# Patient Record
Sex: Female | Born: 1935
Health system: Southern US, Community
[De-identification: ages and names within clinical notes are randomized; demographics above are authoritative.]

## PROBLEM LIST (undated history)

## (undated) DIAGNOSIS — N6019 Diffuse cystic mastopathy of unspecified breast: Secondary | ICD-10-CM

## (undated) DIAGNOSIS — I739 Peripheral vascular disease, unspecified: Secondary | ICD-10-CM

## (undated) DIAGNOSIS — IMO0002 Reserved for concepts with insufficient information to code with codable children: Secondary | ICD-10-CM

## (undated) DIAGNOSIS — I779 Disorder of arteries and arterioles, unspecified: Secondary | ICD-10-CM

## (undated) DIAGNOSIS — G5720 Lesion of femoral nerve, unspecified lower limb: Secondary | ICD-10-CM

## (undated) DIAGNOSIS — E119 Type 2 diabetes mellitus without complications: Secondary | ICD-10-CM

## (undated) DIAGNOSIS — I10 Essential (primary) hypertension: Secondary | ICD-10-CM

## (undated) DIAGNOSIS — E7800 Pure hypercholesterolemia, unspecified: Secondary | ICD-10-CM

## (undated) HISTORY — DX: Type 2 diabetes mellitus without complications: E11.9

## (undated) HISTORY — DX: Pure hypercholesterolemia, unspecified: E78.00

## (undated) HISTORY — DX: Diffuse cystic mastopathy of unspecified breast: N60.19

## (undated) HISTORY — PX: OVARIAN CYST REMOVAL: SHX89

## (undated) HISTORY — PX: CHOLECYSTECTOMY: SHX55

## (undated) HISTORY — DX: Lesion of femoral nerve, unspecified lower limb: G57.20

## (undated) HISTORY — DX: Disorder of arteries and arterioles, unspecified: I77.9

## (undated) HISTORY — DX: Reserved for concepts with insufficient information to code with codable children: IMO0002

## (undated) HISTORY — PX: PARTIAL HYSTERECTOMY: SHX80

## (undated) HISTORY — PX: INGUINAL HERNIA REPAIR: SUR1180

## (undated) HISTORY — PX: HERNIA REPAIR: SHX51

## (undated) HISTORY — PX: APPENDECTOMY: SHX54

## (undated) HISTORY — DX: Essential (primary) hypertension: I10

## (undated) HISTORY — DX: Peripheral vascular disease, unspecified: I73.9

---

## 2004-08-30 LAB — HM COLONOSCOPY

## 2004-12-12 ENCOUNTER — Ambulatory Visit: Payer: Self-pay | Admitting: Internal Medicine

## 2005-01-17 ENCOUNTER — Ambulatory Visit: Payer: Self-pay | Admitting: Internal Medicine

## 2005-01-19 ENCOUNTER — Ambulatory Visit: Payer: Self-pay | Admitting: Internal Medicine

## 2005-02-28 ENCOUNTER — Ambulatory Visit: Payer: Self-pay | Admitting: Unknown Physician Specialty

## 2005-07-25 ENCOUNTER — Ambulatory Visit: Payer: Self-pay | Admitting: Internal Medicine

## 2006-01-21 ENCOUNTER — Ambulatory Visit: Payer: Self-pay | Admitting: Internal Medicine

## 2007-02-12 ENCOUNTER — Ambulatory Visit: Payer: Self-pay | Admitting: Internal Medicine

## 2008-03-03 ENCOUNTER — Ambulatory Visit: Payer: Self-pay | Admitting: Internal Medicine

## 2009-03-09 ENCOUNTER — Ambulatory Visit: Payer: Self-pay | Admitting: Internal Medicine

## 2009-05-17 ENCOUNTER — Ambulatory Visit: Payer: Self-pay | Admitting: Internal Medicine

## 2009-06-14 ENCOUNTER — Observation Stay: Payer: Self-pay | Admitting: Internal Medicine

## 2010-02-27 ENCOUNTER — Ambulatory Visit: Payer: Self-pay | Admitting: Internal Medicine

## 2010-03-20 ENCOUNTER — Ambulatory Visit: Payer: Self-pay | Admitting: Gynecologic Oncology

## 2010-03-22 ENCOUNTER — Ambulatory Visit: Payer: Self-pay | Admitting: Internal Medicine

## 2010-04-03 ENCOUNTER — Ambulatory Visit: Payer: Self-pay | Admitting: Unknown Physician Specialty

## 2010-04-11 ENCOUNTER — Ambulatory Visit: Payer: Self-pay | Admitting: Unknown Physician Specialty

## 2010-04-14 LAB — PATHOLOGY REPORT

## 2010-04-20 ENCOUNTER — Ambulatory Visit: Payer: Self-pay | Admitting: Gynecologic Oncology

## 2010-04-26 ENCOUNTER — Ambulatory Visit: Payer: Self-pay | Admitting: Physician Assistant

## 2010-05-08 ENCOUNTER — Ambulatory Visit: Payer: Self-pay | Admitting: Internal Medicine

## 2010-11-14 ENCOUNTER — Inpatient Hospital Stay: Payer: Self-pay | Admitting: Internal Medicine

## 2011-01-04 ENCOUNTER — Ambulatory Visit: Payer: Self-pay | Admitting: Internal Medicine

## 2011-02-18 ENCOUNTER — Ambulatory Visit: Payer: Self-pay | Admitting: Oncology

## 2011-03-08 ENCOUNTER — Ambulatory Visit: Payer: Self-pay | Admitting: Oncology

## 2011-03-21 ENCOUNTER — Ambulatory Visit: Payer: Self-pay | Admitting: Oncology

## 2011-03-26 ENCOUNTER — Ambulatory Visit: Payer: Self-pay | Admitting: Internal Medicine

## 2011-03-27 ENCOUNTER — Ambulatory Visit: Payer: Self-pay | Admitting: General Surgery

## 2011-03-28 ENCOUNTER — Ambulatory Visit: Payer: Self-pay | Admitting: Internal Medicine

## 2011-03-29 ENCOUNTER — Ambulatory Visit: Payer: Self-pay | Admitting: Oncology

## 2011-04-02 ENCOUNTER — Ambulatory Visit: Payer: Self-pay | Admitting: General Surgery

## 2011-04-16 ENCOUNTER — Ambulatory Visit: Payer: Self-pay | Admitting: General Surgery

## 2011-04-18 LAB — PATHOLOGY REPORT

## 2011-06-04 ENCOUNTER — Ambulatory Visit: Payer: Self-pay | Admitting: Oncology

## 2011-06-05 ENCOUNTER — Ambulatory Visit: Payer: Self-pay | Admitting: Oncology

## 2011-06-21 ENCOUNTER — Ambulatory Visit: Payer: Self-pay | Admitting: Oncology

## 2011-07-04 ENCOUNTER — Inpatient Hospital Stay: Payer: Self-pay | Admitting: Internal Medicine

## 2011-07-10 LAB — PATHOLOGY REPORT

## 2011-08-29 DIAGNOSIS — R05 Cough: Secondary | ICD-10-CM | POA: Diagnosis not present

## 2011-08-29 DIAGNOSIS — R509 Fever, unspecified: Secondary | ICD-10-CM | POA: Diagnosis not present

## 2011-09-07 DIAGNOSIS — R110 Nausea: Secondary | ICD-10-CM | POA: Diagnosis not present

## 2011-10-01 ENCOUNTER — Ambulatory Visit: Payer: Self-pay | Admitting: Internal Medicine

## 2011-10-01 DIAGNOSIS — N6459 Other signs and symptoms in breast: Secondary | ICD-10-CM | POA: Diagnosis not present

## 2011-10-01 DIAGNOSIS — R928 Other abnormal and inconclusive findings on diagnostic imaging of breast: Secondary | ICD-10-CM | POA: Diagnosis not present

## 2011-10-15 DIAGNOSIS — M79609 Pain in unspecified limb: Secondary | ICD-10-CM | POA: Diagnosis not present

## 2011-10-15 DIAGNOSIS — E7800 Pure hypercholesterolemia, unspecified: Secondary | ICD-10-CM | POA: Diagnosis not present

## 2011-10-15 DIAGNOSIS — I10 Essential (primary) hypertension: Secondary | ICD-10-CM | POA: Diagnosis not present

## 2011-10-15 DIAGNOSIS — E1149 Type 2 diabetes mellitus with other diabetic neurological complication: Secondary | ICD-10-CM | POA: Diagnosis not present

## 2011-10-23 DIAGNOSIS — R928 Other abnormal and inconclusive findings on diagnostic imaging of breast: Secondary | ICD-10-CM | POA: Diagnosis not present

## 2011-10-31 DIAGNOSIS — M810 Age-related osteoporosis without current pathological fracture: Secondary | ICD-10-CM | POA: Diagnosis not present

## 2011-11-30 DIAGNOSIS — R110 Nausea: Secondary | ICD-10-CM | POA: Diagnosis not present

## 2011-12-10 DIAGNOSIS — Z79899 Other long term (current) drug therapy: Secondary | ICD-10-CM | POA: Diagnosis not present

## 2011-12-10 DIAGNOSIS — E7800 Pure hypercholesterolemia, unspecified: Secondary | ICD-10-CM | POA: Diagnosis not present

## 2011-12-10 DIAGNOSIS — E119 Type 2 diabetes mellitus without complications: Secondary | ICD-10-CM | POA: Diagnosis not present

## 2011-12-10 DIAGNOSIS — R634 Abnormal weight loss: Secondary | ICD-10-CM | POA: Diagnosis not present

## 2011-12-14 DIAGNOSIS — I10 Essential (primary) hypertension: Secondary | ICD-10-CM | POA: Diagnosis not present

## 2011-12-14 DIAGNOSIS — E119 Type 2 diabetes mellitus without complications: Secondary | ICD-10-CM | POA: Diagnosis not present

## 2011-12-14 DIAGNOSIS — E7800 Pure hypercholesterolemia, unspecified: Secondary | ICD-10-CM | POA: Diagnosis not present

## 2011-12-14 DIAGNOSIS — G609 Hereditary and idiopathic neuropathy, unspecified: Secondary | ICD-10-CM | POA: Diagnosis not present

## 2011-12-17 DIAGNOSIS — G609 Hereditary and idiopathic neuropathy, unspecified: Secondary | ICD-10-CM | POA: Diagnosis not present

## 2011-12-17 DIAGNOSIS — M76899 Other specified enthesopathies of unspecified lower limb, excluding foot: Secondary | ICD-10-CM | POA: Diagnosis not present

## 2011-12-17 DIAGNOSIS — M47817 Spondylosis without myelopathy or radiculopathy, lumbosacral region: Secondary | ICD-10-CM | POA: Diagnosis not present

## 2011-12-17 DIAGNOSIS — M79609 Pain in unspecified limb: Secondary | ICD-10-CM | POA: Diagnosis not present

## 2011-12-24 DIAGNOSIS — M79609 Pain in unspecified limb: Secondary | ICD-10-CM | POA: Diagnosis not present

## 2011-12-24 DIAGNOSIS — R209 Unspecified disturbances of skin sensation: Secondary | ICD-10-CM | POA: Diagnosis not present

## 2011-12-25 DIAGNOSIS — Z1211 Encounter for screening for malignant neoplasm of colon: Secondary | ICD-10-CM | POA: Diagnosis not present

## 2011-12-31 ENCOUNTER — Ambulatory Visit: Payer: Self-pay | Admitting: Rheumatology

## 2011-12-31 DIAGNOSIS — IMO0002 Reserved for concepts with insufficient information to code with codable children: Secondary | ICD-10-CM | POA: Diagnosis not present

## 2011-12-31 DIAGNOSIS — M5137 Other intervertebral disc degeneration, lumbosacral region: Secondary | ICD-10-CM | POA: Diagnosis not present

## 2011-12-31 DIAGNOSIS — M47817 Spondylosis without myelopathy or radiculopathy, lumbosacral region: Secondary | ICD-10-CM | POA: Diagnosis not present

## 2011-12-31 DIAGNOSIS — M51379 Other intervertebral disc degeneration, lumbosacral region without mention of lumbar back pain or lower extremity pain: Secondary | ICD-10-CM | POA: Diagnosis not present

## 2011-12-31 DIAGNOSIS — M5126 Other intervertebral disc displacement, lumbar region: Secondary | ICD-10-CM | POA: Diagnosis not present

## 2011-12-31 DIAGNOSIS — M76899 Other specified enthesopathies of unspecified lower limb, excluding foot: Secondary | ICD-10-CM | POA: Diagnosis not present

## 2012-01-25 DIAGNOSIS — R195 Other fecal abnormalities: Secondary | ICD-10-CM | POA: Diagnosis not present

## 2012-03-19 DIAGNOSIS — Z23 Encounter for immunization: Secondary | ICD-10-CM | POA: Diagnosis not present

## 2012-03-26 ENCOUNTER — Ambulatory Visit: Payer: Self-pay | Admitting: Unknown Physician Specialty

## 2012-03-26 DIAGNOSIS — Z1231 Encounter for screening mammogram for malignant neoplasm of breast: Secondary | ICD-10-CM | POA: Diagnosis not present

## 2012-04-09 DIAGNOSIS — E119 Type 2 diabetes mellitus without complications: Secondary | ICD-10-CM | POA: Diagnosis not present

## 2012-04-09 DIAGNOSIS — Z79899 Other long term (current) drug therapy: Secondary | ICD-10-CM | POA: Diagnosis not present

## 2012-04-09 DIAGNOSIS — E7800 Pure hypercholesterolemia, unspecified: Secondary | ICD-10-CM | POA: Diagnosis not present

## 2012-04-09 DIAGNOSIS — I10 Essential (primary) hypertension: Secondary | ICD-10-CM | POA: Diagnosis not present

## 2012-04-18 DIAGNOSIS — N390 Urinary tract infection, site not specified: Secondary | ICD-10-CM | POA: Diagnosis not present

## 2012-04-18 DIAGNOSIS — R300 Dysuria: Secondary | ICD-10-CM | POA: Diagnosis not present

## 2012-05-30 DIAGNOSIS — R109 Unspecified abdominal pain: Secondary | ICD-10-CM | POA: Diagnosis not present

## 2012-05-30 DIAGNOSIS — I10 Essential (primary) hypertension: Secondary | ICD-10-CM | POA: Diagnosis not present

## 2012-06-12 DIAGNOSIS — Z23 Encounter for immunization: Secondary | ICD-10-CM | POA: Diagnosis not present

## 2012-08-26 ENCOUNTER — Encounter: Payer: Self-pay | Admitting: Internal Medicine

## 2012-08-26 ENCOUNTER — Ambulatory Visit (INDEPENDENT_AMBULATORY_CARE_PROVIDER_SITE_OTHER): Payer: Medicare Other | Admitting: Internal Medicine

## 2012-08-26 VITALS — BP 134/76 | HR 56 | Temp 97.8°F | Ht 66.0 in | Wt 158.0 lb

## 2012-08-26 DIAGNOSIS — E7800 Pure hypercholesterolemia, unspecified: Secondary | ICD-10-CM

## 2012-08-26 DIAGNOSIS — K3184 Gastroparesis: Secondary | ICD-10-CM | POA: Diagnosis not present

## 2012-08-26 DIAGNOSIS — I10 Essential (primary) hypertension: Secondary | ICD-10-CM | POA: Insufficient documentation

## 2012-08-26 DIAGNOSIS — E119 Type 2 diabetes mellitus without complications: Secondary | ICD-10-CM

## 2012-08-26 DIAGNOSIS — E1165 Type 2 diabetes mellitus with hyperglycemia: Secondary | ICD-10-CM | POA: Insufficient documentation

## 2012-08-26 DIAGNOSIS — Z139 Encounter for screening, unspecified: Secondary | ICD-10-CM

## 2012-08-26 DIAGNOSIS — E1143 Type 2 diabetes mellitus with diabetic autonomic (poly)neuropathy: Secondary | ICD-10-CM | POA: Insufficient documentation

## 2012-08-26 NOTE — Assessment & Plan Note (Addendum)
Sugars under good control.  She is no longer taking her lunch insulin.  She has decreased her evening insulin.  Follow.  Avoid lows.

## 2012-09-03 ENCOUNTER — Other Ambulatory Visit (INDEPENDENT_AMBULATORY_CARE_PROVIDER_SITE_OTHER): Payer: Medicare Other

## 2012-09-03 DIAGNOSIS — E7800 Pure hypercholesterolemia, unspecified: Secondary | ICD-10-CM

## 2012-09-03 DIAGNOSIS — E119 Type 2 diabetes mellitus without complications: Secondary | ICD-10-CM | POA: Diagnosis not present

## 2012-09-03 LAB — BASIC METABOLIC PANEL
BUN: 16 mg/dL (ref 6–23)
CO2: 30 mEq/L (ref 19–32)
Chloride: 101 mEq/L (ref 96–112)
Creatinine, Ser: 0.7 mg/dL (ref 0.4–1.2)
Glucose, Bld: 169 mg/dL — ABNORMAL HIGH (ref 70–99)

## 2012-09-03 LAB — HEPATIC FUNCTION PANEL
Alkaline Phosphatase: 50 U/L (ref 39–117)
Bilirubin, Direct: 0.1 mg/dL (ref 0.0–0.3)
Total Bilirubin: 0.7 mg/dL (ref 0.3–1.2)

## 2012-09-03 LAB — LIPID PANEL
Cholesterol: 193 mg/dL (ref 0–200)
LDL Cholesterol: 126 mg/dL — ABNORMAL HIGH (ref 0–99)
Total CHOL/HDL Ratio: 4

## 2012-09-18 ENCOUNTER — Encounter: Payer: Self-pay | Admitting: Internal Medicine

## 2012-09-18 NOTE — Progress Notes (Signed)
Subjective:    Patient ID: Debra Kirk, female    DOB: [birth year], 79 y.o.   MRN: [id number]  HPI 79 year old female with past history of hypertension, hypercholesterolemia, diabetes and mononeuritis involving the left leg who comes in today for a scheduled follow up.  She states she is doing well.  Was having problems with her hip, but is s/p injection and now hip is ok.  Receiving Prolia now.  Due 2/14.  Taking 7 units of insulin with supper only.  Sugars averaging 120-130s in the am, 130-150s at lunch and 110-140 two hours after supper.  No significant problems with low blood sugars.  Overall she feels she is doing well.  No chest pain or tightness.  Breathing stable.  Bowels stable.    Past Medical History  Diagnosis Date  . Hypertension   . Diabetes mellitus   . Hypercholesterolemia   . Femoral neuropathy     left, elevated CRP, ESR, negative FANA and ANCA  . Carotid artery disease   . Compression fracture     s/p fosamax, prolia  . Fibrocystic breast disease     Current Outpatient Prescriptions on File Prior to Visit  Medication Sig Dispense Refill  . amLODipine (NORVASC) 5 MG tablet Take 5 mg by mouth daily.      . Calcium Carbonate-Vitamin D (CALCIUM 600+D) 600-400 MG-UNIT per tablet Take 1 tablet by mouth 2 (two) times daily.      . hydrochlorothiazide (HYDRODIURIL) 25 MG tablet Take 25 mg by mouth daily.      . insulin lispro (HUMALOG) 100 UNIT/ML injection Inject 7 Units into the skin. Before supper      . metoCLOPramide (REGLAN) 5 MG tablet Take 5 mg by mouth 3 (three) times daily.      . metoprolol (LOPRESSOR) 50 MG tablet 1 1/2 tablet bid      . omeprazole (PRILOSEC) 20 MG capsule Take 20 mg by mouth 2 (two) times daily.      . pravastatin (PRAVACHOL) 40 MG tablet Take 40 mg by mouth daily.        Review of Systems Patient denies any headache, lightheadedness or dizziness. No sinus or allergy symptoms.  No chest pain, tightness or palpitations.  No increased shortness  of breath, cough or congestion.  No nausea or vomiting.  No abdominal pain or cramping. No acid reflux.  No bowel change, such as diarrhea, constipation, BRBPR or melana.  No urine change.  Hip better.       Objective:   Physical Exam Filed Vitals:   08/26/12 1030  BP: 134/76  Pulse: 56  Temp: 97.8 F (50.39 C)   79 year old female in no acute distress.   HEENT:  Nares- clear.  Oropharynx - without lesions. NECK:  Supple.  Nontender.  No audible bruit.  HEART:  Appears to be regular. LUNGS:  No crackles or wheezing audible.  Respirations even and unlabored.  RADIAL PULSE:  Equal bilaterally.  ABDOMEN:  Soft, nontender.  Bowel sounds present and normal.  No audible abdominal bruit.  EXTREMITIES:  No increased edema present.  DP pulses palpable and equal bilaterally.           Assessment & Plan:  CARDIOVASCULAR.  ECHO 7/11 revealed EF 60%, mild to moderate mitral insufficiency, mild RV enlargement and mild LA enlargement.  Asymptomatic.  Follow.    PULMONARY.  Breathing stable.    PREVIOUS WEIGHT LOSS.  Weight stable at 158 pounds.    CHEST PAIN/RIB  PAIN.  Resolved.    MEDIASTINAL LYMPH NODES.  Noted on CT scan performed in the evaluation of the chest/rib pain.  Saw Dr Doylene Canning.  Last CT - no significant change.  Felt no further w/up warranted - per pt.    HERNIA.  Saw Dr Lemar Livings.  S/p repair.  Doing well.  Follow.    HEALTH MAINTENANCE.  Physical 12/14/11.  She is s/p hysterectomy and does not need yearly pap smears.  Colonoscopy 02/28/05 - diverticulosis and internal hemorrhoids.  Mammogram 03/26/11 recommended additional views.  These were performed 03/28/11 - read as BiRADS II.  Results forwarded to Dr Doylene Canning.  Advised six month follow up mammogram.  This was performed 2/13 - read as BiRADS II.  Saw Dr Lemar Livings.  Recommended bilateral mammogram fall 2013.  Schedule mammogram.

## 2012-09-18 NOTE — Assessment & Plan Note (Signed)
On pravastatin.  Low cholesterol diet and exercise.  Check lipid panel and liver function.    

## 2012-09-18 NOTE — Assessment & Plan Note (Signed)
Previous EGD revealed findings c/w gastroparesis.  On reglan and doing better.  Continues follow up with GI - regarding duration of reglan treatment.   

## 2012-09-18 NOTE — Assessment & Plan Note (Signed)
Blood pressure is doing well.  Same medication regimen.  Check metabolic panel.  Follow.

## 2012-10-09 ENCOUNTER — Other Ambulatory Visit: Payer: Self-pay | Admitting: *Deleted

## 2012-10-13 ENCOUNTER — Other Ambulatory Visit: Payer: Self-pay | Admitting: *Deleted

## 2012-10-14 MED ORDER — METOPROLOL TARTRATE 50 MG PO TABS: ORAL_TABLET | ORAL | Status: DC

## 2012-10-14 NOTE — Telephone Encounter (Signed)
Sent in to pharmacy.  

## 2012-10-27 DIAGNOSIS — L600 Ingrowing nail: Secondary | ICD-10-CM | POA: Diagnosis not present

## 2012-10-27 DIAGNOSIS — E119 Type 2 diabetes mellitus without complications: Secondary | ICD-10-CM | POA: Diagnosis not present

## 2012-10-31 ENCOUNTER — Telehealth: Payer: Self-pay | Admitting: *Deleted

## 2012-10-31 NOTE — Telephone Encounter (Signed)
Left message for patient to return call, concerning sugar readings. In yellow folder on my desk.

## 2012-11-05 ENCOUNTER — Telehealth: Payer: Self-pay | Admitting: Internal Medicine

## 2012-11-05 NOTE — Telephone Encounter (Signed)
Called patient back, left message for return call. Will try again later.

## 2012-11-05 NOTE — Telephone Encounter (Signed)
Patient returning your call.

## 2012-11-05 NOTE — Telephone Encounter (Signed)
Pt returned call again  °

## 2012-11-06 ENCOUNTER — Other Ambulatory Visit: Payer: Self-pay | Admitting: General Practice

## 2012-11-06 MED ORDER — HYDROCHLOROTHIAZIDE 25 MG PO TABS: 25.0000 mg | ORAL_TABLET | Freq: Every day | ORAL | Status: DC

## 2012-11-06 MED ORDER — OMEPRAZOLE 20 MG PO CPDR: 20.0000 mg | DELAYED_RELEASE_CAPSULE | Freq: Two times a day (BID) | ORAL | Status: DC

## 2012-11-06 NOTE — Telephone Encounter (Signed)
Medications filled for pt.

## 2012-11-06 NOTE — Telephone Encounter (Signed)
Left message on patient's voice mail letting her know every was fine with sugars. Continue to follow.

## 2012-11-19 ENCOUNTER — Telehealth: Payer: Self-pay | Admitting: Internal Medicine

## 2012-11-19 NOTE — Telephone Encounter (Signed)
See if she can come in at 11:45 tomorrow to discuss treatment options.

## 2012-11-19 NOTE — Telephone Encounter (Signed)
Please advise 

## 2012-11-19 NOTE — Telephone Encounter (Signed)
Pt aware of appointment ask pt to be here @ 11:30

## 2012-11-19 NOTE — Telephone Encounter (Signed)
Patient is almost out of her insuline . She stated that she was instructed by Dr. Lorin Picket that when she was almost out of her insuline that she would start her on the pill. Patient does not want the metformin.

## 2012-11-20 ENCOUNTER — Ambulatory Visit (INDEPENDENT_AMBULATORY_CARE_PROVIDER_SITE_OTHER): Payer: Medicare Other | Admitting: Internal Medicine

## 2012-11-20 ENCOUNTER — Encounter: Payer: Self-pay | Admitting: Internal Medicine

## 2012-11-20 VITALS — BP 130/70 | HR 58 | Temp 98.0°F | Ht 66.0 in | Wt 156.5 lb

## 2012-11-20 DIAGNOSIS — I10 Essential (primary) hypertension: Secondary | ICD-10-CM

## 2012-11-20 DIAGNOSIS — E119 Type 2 diabetes mellitus without complications: Secondary | ICD-10-CM | POA: Diagnosis not present

## 2012-11-20 MED ORDER — AMLODIPINE BESYLATE 5 MG PO TABS: 5.0000 mg | ORAL_TABLET | Freq: Every day | ORAL | Status: DC

## 2012-11-20 MED ORDER — GLIMEPIRIDE 2 MG PO TABS: 2.0000 mg | ORAL_TABLET | Freq: Every day | ORAL | Status: DC

## 2012-11-20 MED ORDER — METOPROLOL TARTRATE 50 MG PO TABS: ORAL_TABLET | ORAL | Status: DC

## 2012-11-21 ENCOUNTER — Ambulatory Visit: Payer: Private Health Insurance - Indemnity | Admitting: Internal Medicine

## 2012-11-23 ENCOUNTER — Encounter: Payer: Self-pay | Admitting: Internal Medicine

## 2012-11-23 NOTE — Assessment & Plan Note (Signed)
Blood pressure is doing well.  Same medication regimen.  Follow.   

## 2012-11-23 NOTE — Assessment & Plan Note (Signed)
Sugars as outlined.  Wants to go back on oral agent.  Will stop her insulin.  Start amaryl 2mg  q day.  Monitor for lows.  Eat regular meals.  Check and record sugars bid.  Get her back in soon to reassess.

## 2012-11-23 NOTE — Progress Notes (Signed)
Subjective:    Patient ID: Debra Kirk, female    DOB: 01-11-1930, 79 y.o.   MRN: [id number]  HPI 79 year old female with past history of hypertension, hypercholesterolemia, diabetes and mononeuritis involving the left leg who comes in today for a scheduled follow up.  She states she is doing well.  Was having problems with her hip, but is s/p injection and now hip is ok.  Receiving Prolia now. Taking 8 units of insulin with supper only.  Sugars averaging 140-150 in the am, 100-160  two hours after supper.  No significant problems with low blood sugars.  Overall she feels she is doing well.  No chest pain or tightness.  Breathing stable.  Bowels stable.  Wants to change to oral agents.  Wants to come off the insulin.  Was placed on insulin (from oral agents because she was on prednisone therapy).  Off prednisone now.  Prefers generic.  Prefers not to go on metformin.     Past Medical History  Diagnosis Date  . Hypertension   . Diabetes mellitus   . Hypercholesterolemia   . Femoral neuropathy     left, elevated CRP, ESR, negative FANA and ANCA  . Carotid artery disease   . Compression fracture     s/p fosamax, prolia  . Fibrocystic breast disease     Current Outpatient Prescriptions on File Prior to Visit  Medication Sig Dispense Refill  . aspirin 81 MG tablet Take 81 mg by mouth daily.      . Calcium Carbonate-Vitamin D (CALCIUM 600+D) 600-400 MG-UNIT per tablet Take 1 tablet by mouth 2 (two) times daily.      Marland Kitchen denosumab (PROLIA) 60 MG/ML SOLN injection Inject 60 mg into the skin every 6 (six) months. Administer in upper arm, thigh, or abdomen      . glucose blood test strip Contour test strips Check blood sugar tid      . hydrochlorothiazide (HYDRODIURIL) 25 MG tablet Take 1 tablet (25 mg total) by mouth daily.  90 tablet  1  . metoCLOPramide (REGLAN) 5 MG tablet Take 5 mg by mouth 3 (three) times daily.      Marland Kitchen omeprazole (PRILOSEC) 20 MG capsule Take 1 capsule (20 mg total) by mouth  2 (two) times daily.  180 capsule  1  . pravastatin (PRAVACHOL) 40 MG tablet Take 40 mg by mouth daily.       No current facility-administered medications on file prior to visit.    Review of Systems Patient denies any headache, lightheadedness or dizziness. No sinus or allergy symptoms.  No chest pain, tightness or palpitations.  No increased shortness of breath, cough or congestion.  No nausea or vomiting.  No abdominal pain or cramping. No acid reflux.  No bowel change, such as diarrhea, constipation, BRBPR or melana.  No urine change.  Hip better.       Objective:   Physical Exam  Filed Vitals:   11/20/12 1151  BP: 130/70  Pulse: 58  Temp: 98 F (81.34 C)   79 year old female in no acute distress.   HEENT:  Nares- clear.  Oropharynx - without lesions. NECK:  Supple.  Nontender.  No audible bruit.  HEART:  Appears to be regular. LUNGS:  No crackles or wheezing audible.  Respirations even and unlabored.  RADIAL PULSE:  Equal bilaterally.  ABDOMEN:  Soft, nontender.  Bowel sounds present and normal.  No audible abdominal bruit.  EXTREMITIES:  No increased edema present.  DP  pulses palpable and equal bilaterally.           Assessment & Plan:  CARDIOVASCULAR.  ECHO 7/11 revealed EF 60%, mild to moderate mitral insufficiency, mild RV enlargement and mild LA enlargement.  Asymptomatic.  Follow.    PULMONARY.  Breathing stable.    PREVIOUS WEIGHT LOSS.  Weight relatively stable at 156 pounds. Follow.   CHEST PAIN/RIB PAIN.  Resolved.    MEDIASTINAL LYMPH NODES.  Noted on CT scan performed in the evaluation of the chest/rib pain.  Saw Dr Doylene Canning.  Last CT - no significant change.  Felt no further w/up warranted - per pt.    HERNIA.  Saw Dr Lemar Livings.  S/p repair.  Doing well.  Follow.    HEALTH MAINTENANCE.  Physical 12/14/11.  She is s/p hysterectomy and does not need yearly pap smears.  Colonoscopy 02/28/05 - diverticulosis and internal hemorrhoids.  Mammogram 03/26/11 recommended  additional views.  These were performed 03/28/11 - read as BiRADS II.  Results forwarded to Dr Doylene Canning.  Advised six month follow up mammogram.  This was performed 2/13 - read as BiRADS II.  Saw Dr Lemar Livings.  Recommended bilateral mammogram fall 2013.

## 2012-12-02 ENCOUNTER — Encounter: Payer: Self-pay | Admitting: Internal Medicine

## 2012-12-02 ENCOUNTER — Ambulatory Visit (INDEPENDENT_AMBULATORY_CARE_PROVIDER_SITE_OTHER): Payer: Medicare Other | Admitting: Internal Medicine

## 2012-12-02 VITALS — BP 130/60 | HR 74 | Temp 98.0°F | Ht 65.5 in | Wt 157.5 lb

## 2012-12-02 DIAGNOSIS — Z1239 Encounter for other screening for malignant neoplasm of breast: Secondary | ICD-10-CM | POA: Diagnosis not present

## 2012-12-02 DIAGNOSIS — M810 Age-related osteoporosis without current pathological fracture: Secondary | ICD-10-CM | POA: Diagnosis not present

## 2012-12-02 DIAGNOSIS — E119 Type 2 diabetes mellitus without complications: Secondary | ICD-10-CM

## 2012-12-02 DIAGNOSIS — I10 Essential (primary) hypertension: Secondary | ICD-10-CM | POA: Diagnosis not present

## 2012-12-02 DIAGNOSIS — K3184 Gastroparesis: Secondary | ICD-10-CM

## 2012-12-02 DIAGNOSIS — E7800 Pure hypercholesterolemia, unspecified: Secondary | ICD-10-CM

## 2012-12-02 NOTE — Progress Notes (Signed)
Subjective:    Patient ID: Debra Kirk, female    DOB: Oct 29, 1929, 79 y.o.   MRN: [id number]  HPI 79 year old female with past history of hypertension, hypercholesterolemia, diabetes and mononeuritis involving the left leg who comes in today to follow up on these issues as well as for a complete physical exam.  She states she is doing well.  Was having problems with her hip, but is s/p injection and now hip is ok.  We changed her to oral amaryl last visit.  Off insulin.  No significant problems with low blood sugars.  Overall she feels she is doing well.  No chest pain or tightness.  Breathing stable.  Bowels stable.  Sugars attached.     Past Medical History  Diagnosis Date  . Hypertension   . Diabetes mellitus   . Hypercholesterolemia   . Femoral neuropathy     left, elevated CRP, ESR, negative FANA and ANCA  . Carotid artery disease   . Compression fracture     s/p fosamax, prolia  . Fibrocystic breast disease     Current Outpatient Prescriptions on File Prior to Visit  Medication Sig Dispense Refill  . amLODipine (NORVASC) 5 MG tablet Take 1 tablet (5 mg total) by mouth daily.  90 tablet  1  . aspirin 81 MG tablet Take 81 mg by mouth daily.      . Calcium Carbonate-Vitamin D (CALCIUM 600+D) 600-400 MG-UNIT per tablet Take 1 tablet by mouth 2 (two) times daily.      Marland Kitchen denosumab (PROLIA) 60 MG/ML SOLN injection Inject 60 mg into the skin every 6 (six) months. Administer in upper arm, thigh, or abdomen      . glimepiride (AMARYL) 2 MG tablet Take 1 tablet (2 mg total) by mouth daily before breakfast.  30 tablet  1  . glucose blood test strip Contour test strips Check blood sugar tid      . hydrochlorothiazide (HYDRODIURIL) 25 MG tablet Take 1 tablet (25 mg total) by mouth daily.  90 tablet  1  . metoCLOPramide (REGLAN) 5 MG tablet Take 5 mg by mouth 2 (two) times daily.       . metoprolol (LOPRESSOR) 50 MG tablet 1 1/2 tablet bid  270 tablet  1  . omeprazole (PRILOSEC) 20 MG  capsule Take 1 capsule (20 mg total) by mouth 2 (two) times daily.  180 capsule  1  . pravastatin (PRAVACHOL) 40 MG tablet Take 40 mg by mouth daily.       No current facility-administered medications on file prior to visit.    Review of Systems Patient denies any headache, lightheadedness or dizziness. No sinus or allergy symptoms.  No chest pain, tightness or palpitations.  No increased shortness of breath, cough or congestion.  No nausea or vomiting.   No abdominal pain or cramping. No acid reflux.  No bowel change, such as diarrhea, constipation, BRBPR or melana.  No urine change.  Hip better.       Objective:   Physical Exam  Filed Vitals:   12/02/12 0828  BP: 130/60  Pulse: 74  Temp: 98 F (36.7 C)   Blood pressure recheck:  21/59  79 year old female in no acute distress.   HEENT:  Nares- clear.  Oropharynx - without lesions. NECK:  Supple.  Nontender.  No audible bruit.  HEART:  Appears to be regular. LUNGS:  No crackles or wheezing audible.  Respirations even and unlabored.  RADIAL PULSE:  Equal bilaterally.  BREASTS:  No nipple discharge or nipple retraction present.  Could not appreciate any distinct nodules or axillary adenopathy.  ABDOMEN:  Soft, nontender.  Bowel sounds present and normal.  No audible abdominal bruit.  GU:  Normal external genitalia.  Vaginal vault without lesions.  S/p hysterectomy.  Could not appreciate any adnexal masses or tenderness.   RECTAL:  Heme negative.   EXTREMITIES:  No increased edema present.  DP pulses palpable and equal bilaterally.      SKIN:  Without lesions.      Assessment & Plan:  CARDIOVASCULAR.  ECHO 7/11 revealed EF 60%, mild to moderate mitral insufficiency, mild RV enlargement and mild LA enlargement.  Asymptomatic.  Follow.    PULMONARY.  Breathing stable.    PREVIOUS WEIGHT LOSS.  Weight relatively stable at 157 pounds. Follow.   CHEST PAIN/RIB PAIN.  Resolved.    MEDIASTINAL LYMPH NODES.  Noted on CT scan  performed in the evaluation of the chest/rib pain.  Saw Dr Doylene Canning.  Last CT - no significant change.  Felt no further w/up warranted - per pt.    HERNIA.  Saw Dr Lemar Livings.  S/p repair.  Doing well.  Follow.    HEALTH MAINTENANCE.  Physical today.  She is s/p hysterectomy and does not need yearly pap smears.  Colonoscopy 02/28/05 - diverticulosis and internal hemorrhoids.  Mammogram 03/26/11 recommended additional views.  These were performed 03/28/11 - read as BiRADS II.  Results forwarded to Dr Doylene Canning.  Advised six month follow up mammogram.  This was performed 2/13 - read as BiRADS II.  Saw Dr Lemar Livings.  Recommended bilateral mammogram fall 2013.   Need results.

## 2012-12-07 ENCOUNTER — Encounter: Payer: Self-pay | Admitting: Internal Medicine

## 2012-12-07 NOTE — Assessment & Plan Note (Signed)
Previous EGD revealed findings c/w gastroparesis.  On reglan and doing better.  Continues follow up with GI - regarding duration of reglan treatment.   

## 2012-12-07 NOTE — Assessment & Plan Note (Signed)
Sugars as outlined.  Off insulin.  On amaryl 2mg  q day.  No significant lows.  Discussed eating regular meals.  Appropriate snacks.  Continue to check and record sugars bid.  Get her back in soon to reassess.  Sugars attached.  Hold on making changes.  Follow.

## 2012-12-07 NOTE — Assessment & Plan Note (Signed)
On pravastatin.  Low cholesterol diet and exercise.  Follow lipid panel and liver function.    

## 2012-12-07 NOTE — Assessment & Plan Note (Signed)
Blood pressure is doing well.  Same medication regimen.  Check metabolic panel.  Follow.

## 2012-12-08 ENCOUNTER — Telehealth: Payer: Self-pay | Admitting: Emergency Medicine

## 2012-12-08 MED ORDER — PRAVASTATIN SODIUM 40 MG PO TABS: 40.0000 mg | ORAL_TABLET | Freq: Every day | ORAL | Status: DC

## 2012-12-08 NOTE — Telephone Encounter (Signed)
Can we please refill the pravastatin 40mg  tablet, she states the pharmacy sent over a refill request on Tuesday of last week. I didn't see anything in her chart. She states she does not have any for this evening. Please advise.

## 2012-12-08 NOTE — Telephone Encounter (Signed)
Refilled patient pravastatin 40 mg tab sent to [facility] pharmacy

## 2012-12-30 ENCOUNTER — Encounter: Payer: Self-pay | Admitting: Internal Medicine

## 2012-12-30 ENCOUNTER — Ambulatory Visit (INDEPENDENT_AMBULATORY_CARE_PROVIDER_SITE_OTHER): Payer: Medicare Other | Admitting: Internal Medicine

## 2012-12-30 VITALS — BP 130/70 | HR 74 | Temp 98.5°F | Ht 65.5 in | Wt 157.0 lb

## 2012-12-30 DIAGNOSIS — E7800 Pure hypercholesterolemia, unspecified: Secondary | ICD-10-CM

## 2012-12-30 DIAGNOSIS — K3184 Gastroparesis: Secondary | ICD-10-CM

## 2012-12-30 DIAGNOSIS — I10 Essential (primary) hypertension: Secondary | ICD-10-CM

## 2012-12-30 DIAGNOSIS — E119 Type 2 diabetes mellitus without complications: Secondary | ICD-10-CM

## 2012-12-30 MED ORDER — PRAVASTATIN SODIUM 40 MG PO TABS: 40.0000 mg | ORAL_TABLET | Freq: Every day | ORAL | Status: DC

## 2012-12-30 NOTE — Progress Notes (Signed)
Subjective:    Patient ID: Debra Kirk, female    DOB: [birth year], 79 y.o.   MRN: [id number]  HPI 79 year old female with past history of hypertension, hypercholesterolemia, diabetes and mononeuritis involving the left leg who comes in today for a scheduled follow up. She states she is doing well.  Was having problems with her hip, but is s/p injection and now hip is ok.  We changed her to oral amaryl last visit.  Off insulin.  No significant problems with low blood sugars.  Overall she feels she is doing well.  No chest pain or tightness.  Breathing stable.  Bowels stable.  Sugars attached.     Past Medical History  Diagnosis Date  . Hypertension   . Diabetes mellitus   . Hypercholesterolemia   . Femoral neuropathy     left, elevated CRP, ESR, negative FANA and ANCA  . Carotid artery disease   . Compression fracture     s/p fosamax, prolia  . Fibrocystic breast disease     Current Outpatient Prescriptions on File Prior to Visit  Medication Sig Dispense Refill  . amLODipine (NORVASC) 5 MG tablet Take 1 tablet (5 mg total) by mouth daily.  90 tablet  1  . aspirin 81 MG tablet Take 81 mg by mouth daily.      . Calcium Carbonate-Vitamin D (CALCIUM 600+D) 600-400 MG-UNIT per tablet Take 1 tablet by mouth 2 (two) times daily.      Marland Kitchen denosumab (PROLIA) 60 MG/ML SOLN injection Inject 60 mg into the skin every 6 (six) months. Administer in upper arm, thigh, or abdomen      . glimepiride (AMARYL) 2 MG tablet Take 1 tablet (2 mg total) by mouth daily before breakfast.  30 tablet  1  . glucose blood test strip Contour test strips Check blood sugar tid      . hydrochlorothiazide (HYDRODIURIL) 25 MG tablet Take 1 tablet (25 mg total) by mouth daily.  90 tablet  1  . metoCLOPramide (REGLAN) 5 MG tablet Take 5 mg by mouth 2 (two) times daily.       . metoprolol (LOPRESSOR) 50 MG tablet 1 1/2 tablet bid  270 tablet  1  . omeprazole (PRILOSEC) 20 MG capsule Take 1 capsule (20 mg total) by mouth 2  (two) times daily.  180 capsule  1  . pravastatin (PRAVACHOL) 40 MG tablet Take 1 tablet (40 mg total) by mouth daily.  30 tablet  5   No current facility-administered medications on file prior to visit.    Review of Systems Patient denies any headache, lightheadedness or dizziness. No sinus or allergy symptoms.  No chest pain, tightness or palpitations.  No increased shortness of breath, cough or congestion.  No nausea or vomiting.   No abdominal pain or cramping. No acid reflux.  No bowel change, such as diarrhea, constipation, BRBPR or melana.  No urine change.  Hip better.       Objective:   Physical Exam  Filed Vitals:   12/30/12 0859  BP: 130/70  Pulse: 74  Temp: 98.5 F (36.9 C)   Pulse recheck 58  79 year old female in no acute distress.   HEENT:  Nares- clear.  Oropharynx - without lesions. NECK:  Supple.  Nontender.  No audible bruit.  HEART:  Appears to be regular. LUNGS:  No crackles or wheezing audible.  Respirations even and unlabored.  RADIAL PULSE:  Equal bilaterally.  ABDOMEN:  Soft, nontender.  Bowel sounds  present and normal.  No audible abdominal bruit.   EXTREMITIES:  No increased edema present.  DP pulses palpable and equal bilaterally.      Assessment & Plan:  CARDIOVASCULAR.  ECHO 7/11 revealed EF 60%, mild to moderate mitral insufficiency, mild RV enlargement and mild LA enlargement.  Asymptomatic.  Follow.    PULMONARY.  Breathing stable.    PREVIOUS WEIGHT LOSS.  Weight relatively stable at 157 pounds. Follow.   CHEST PAIN/RIB PAIN.  Resolved.    MEDIASTINAL LYMPH NODES.  Noted on CT scan performed in the evaluation of the chest/rib pain.  Saw Dr Doylene Canning.  Last CT - no significant change.  Felt no further w/up warranted - per pt.    HERNIA.  Saw Dr Lemar Livings.  S/p repair.  Doing well.  Follow.    HEALTH MAINTENANCE.  Physical 12/02/12.  She is s/p hysterectomy and does not need yearly pap smears.  Colonoscopy 02/28/05 - diverticulosis and internal  hemorrhoids.  Mammogram 03/26/11 recommended additional views.  These were performed 03/28/11 - read as BiRADS II.  Results forwarded to Dr Doylene Canning.  Advised six month follow up mammogram.  This was performed 2/13 - read as BiRADS II.  Saw Dr Lemar Livings.  Recommended bilateral mammogram fall 2013.   Per report had mammogram 03/26/12.  Need results.

## 2013-01-01 ENCOUNTER — Telehealth: Payer: Self-pay | Admitting: *Deleted

## 2013-01-01 NOTE — Telephone Encounter (Signed)
Spoke with pt & informed her that Dr Lorin Picket reviewed her BS readings that were mailed: Overall sugars are trending down, pm sugars varying. Continue current med regimen/diet & exercise as tolerated. Continue bedtime snacks & continue to check & record and send in over next 3-4 wks. Pt verbalized understanding.

## 2013-01-12 ENCOUNTER — Encounter: Payer: Self-pay | Admitting: Internal Medicine

## 2013-01-12 NOTE — Assessment & Plan Note (Signed)
Blood pressure is doing well.  Same medication regimen.  Check metabolic panel.  Follow.  

## 2013-01-12 NOTE — Assessment & Plan Note (Signed)
On pravastatin.  Low cholesterol diet and exercise.  Follow lipid panel and liver function.    

## 2013-01-12 NOTE — Assessment & Plan Note (Signed)
Previous EGD revealed findings c/w gastroparesis.  On reglan and doing better.  Continues follow up with GI - regarding duration of reglan treatment.   

## 2013-01-12 NOTE — Assessment & Plan Note (Signed)
Sugars as outlined.  Off insulin.  On amaryl 2mg  q day.  No significant lows.  Discussed eating regular meals.  Appropriate snacks.  Continue to check and record sugars bid.  Sugars attached.  Hold on making changes.  Follow.  (AM sugars averaging 120-140 and PM sugars 170s).

## 2013-01-14 ENCOUNTER — Other Ambulatory Visit: Payer: Medicare Other

## 2013-01-15 ENCOUNTER — Other Ambulatory Visit (INDEPENDENT_AMBULATORY_CARE_PROVIDER_SITE_OTHER): Payer: Medicare Other

## 2013-01-15 DIAGNOSIS — E119 Type 2 diabetes mellitus without complications: Secondary | ICD-10-CM | POA: Diagnosis not present

## 2013-01-15 DIAGNOSIS — E7800 Pure hypercholesterolemia, unspecified: Secondary | ICD-10-CM

## 2013-01-15 LAB — CBC WITH DIFFERENTIAL/PLATELET
Basophils Relative: 0.9 % (ref 0.0–3.0)
Eosinophils Relative: 0 % (ref 0.0–5.0)
HCT: 38.7 % (ref 36.0–46.0)
Hemoglobin: 13.3 g/dL (ref 12.0–15.0)
Lymphs Abs: 1.4 10*3/uL (ref 0.7–4.0)
Monocytes Relative: 9.7 % (ref 3.0–12.0)
Neutro Abs: 3 10*3/uL (ref 1.4–7.7)
RBC: 4.59 Mil/uL (ref 3.87–5.11)
WBC: 4.9 10*3/uL (ref 4.5–10.5)

## 2013-01-15 LAB — BASIC METABOLIC PANEL
CO2: 27 mEq/L (ref 19–32)
Calcium: 8.4 mg/dL (ref 8.4–10.5)
Creatinine, Ser: 0.9 mg/dL (ref 0.4–1.2)
Glucose, Bld: 159 mg/dL — ABNORMAL HIGH (ref 70–99)

## 2013-01-15 LAB — HEPATIC FUNCTION PANEL
AST: 18 U/L (ref 0–37)
Alkaline Phosphatase: 46 U/L (ref 39–117)
Total Bilirubin: 0.7 mg/dL (ref 0.3–1.2)

## 2013-01-15 LAB — TSH: TSH: 1.1 u[IU]/mL (ref 0.35–5.50)

## 2013-01-15 LAB — LIPID PANEL
LDL Cholesterol: 121 mg/dL — ABNORMAL HIGH (ref 0–99)
Total CHOL/HDL Ratio: 5

## 2013-01-15 LAB — MICROALBUMIN / CREATININE URINE RATIO: Microalb Creat Ratio: 0.5 mg/g (ref 0.0–30.0)

## 2013-01-16 ENCOUNTER — Encounter: Payer: Self-pay | Admitting: *Deleted

## 2013-01-26 ENCOUNTER — Encounter: Payer: Self-pay | Admitting: *Deleted

## 2013-03-10 ENCOUNTER — Other Ambulatory Visit: Payer: Self-pay | Admitting: *Deleted

## 2013-03-11 MED ORDER — GLIMEPIRIDE 2 MG PO TABS: 2.0000 mg | ORAL_TABLET | Freq: Every day | ORAL | Status: DC

## 2013-03-17 ENCOUNTER — Telehealth: Payer: Self-pay | Admitting: Internal Medicine

## 2013-03-17 MED ORDER — GLUCOSE BLOOD VI STRP: ORAL_STRIP | Status: DC

## 2013-03-17 NOTE — Telephone Encounter (Signed)
States she has spoken with her pharmacy and they were supposed to have been in touch w/ [place] regarding her test strips.  Pt is out of strips.

## 2013-03-17 NOTE — Telephone Encounter (Signed)
Rx sent in

## 2013-04-01 ENCOUNTER — Ambulatory Visit: Payer: Self-pay | Admitting: Internal Medicine

## 2013-04-01 DIAGNOSIS — Z1231 Encounter for screening mammogram for malignant neoplasm of breast: Secondary | ICD-10-CM | POA: Diagnosis not present

## 2013-04-09 ENCOUNTER — Encounter: Payer: Self-pay | Admitting: Internal Medicine

## 2013-04-27 ENCOUNTER — Other Ambulatory Visit: Payer: Self-pay | Admitting: *Deleted

## 2013-04-27 MED ORDER — HYDROCHLOROTHIAZIDE 25 MG PO TABS: 25.0000 mg | ORAL_TABLET | Freq: Every day | ORAL | Status: DC

## 2013-05-05 ENCOUNTER — Encounter: Payer: Self-pay | Admitting: Internal Medicine

## 2013-05-05 ENCOUNTER — Ambulatory Visit (INDEPENDENT_AMBULATORY_CARE_PROVIDER_SITE_OTHER): Payer: Medicare Other | Admitting: Internal Medicine

## 2013-05-05 VITALS — BP 130/70 | HR 65 | Temp 98.5°F | Ht 65.5 in | Wt 155.5 lb

## 2013-05-05 DIAGNOSIS — E7800 Pure hypercholesterolemia, unspecified: Secondary | ICD-10-CM | POA: Diagnosis not present

## 2013-05-05 DIAGNOSIS — K3184 Gastroparesis: Secondary | ICD-10-CM

## 2013-05-05 DIAGNOSIS — E119 Type 2 diabetes mellitus without complications: Secondary | ICD-10-CM

## 2013-05-05 DIAGNOSIS — Z23 Encounter for immunization: Secondary | ICD-10-CM | POA: Diagnosis not present

## 2013-05-05 DIAGNOSIS — I10 Essential (primary) hypertension: Secondary | ICD-10-CM

## 2013-05-06 ENCOUNTER — Other Ambulatory Visit: Payer: Self-pay | Admitting: *Deleted

## 2013-05-06 MED ORDER — OMEPRAZOLE 20 MG PO CPDR: 20.0000 mg | DELAYED_RELEASE_CAPSULE | Freq: Two times a day (BID) | ORAL | Status: DC

## 2013-05-07 ENCOUNTER — Other Ambulatory Visit: Payer: Self-pay | Admitting: *Deleted

## 2013-05-07 ENCOUNTER — Encounter: Payer: Self-pay | Admitting: Internal Medicine

## 2013-05-07 MED ORDER — GLUCOSE BLOOD VI STRP: ORAL_STRIP | Status: DC

## 2013-05-07 NOTE — Assessment & Plan Note (Signed)
On pravastatin.  Low cholesterol diet and exercise.  Follow lipid panel and liver function.    

## 2013-05-07 NOTE — Progress Notes (Signed)
Subjective:    Patient ID: Debra Kirk, female    DOB: 1930/08/13, 79 y.o.   MRN: [id number]  HPI 79 year old female with past history of hypertension, hypercholesterolemia, diabetes and mononeuritis involving the left leg who comes in today for a scheduled follow up. She states she is doing well.  Was having problems with her hip, but is s/p injection and now hip is ok.  On oral amaryl.  Off insulin.  Did not tolerate metformin.  No significant problems with low blood sugars.  AM sugars averaging 120-140 and PM sugars varying.  See attached list for details.  Overall appear to be doing well.   No chest pain or tightness.  Breathing stable.  Bowels stable.  Sugars attached.     Past Medical History  Diagnosis Date  . Hypertension   . Diabetes mellitus   . Hypercholesterolemia   . Femoral neuropathy     left, elevated CRP, ESR, negative FANA and ANCA  . Carotid artery disease   . Compression fracture     s/p fosamax, prolia  . Fibrocystic breast disease     Current Outpatient Prescriptions on File Prior to Visit  Medication Sig Dispense Refill  . amLODipine (NORVASC) 5 MG tablet Take 1 tablet (5 mg total) by mouth daily.  90 tablet  1  . aspirin 81 MG tablet Take 81 mg by mouth daily.      . Calcium Carbonate-Vitamin D (CALCIUM 600+D) 600-400 MG-UNIT per tablet Take 1 tablet by mouth 2 (two) times daily.      Marland Kitchen denosumab (PROLIA) 60 MG/ML SOLN injection Inject 60 mg into the skin every 6 (six) months. Administer in upper arm, thigh, or abdomen      . glimepiride (AMARYL) 2 MG tablet Take 1 tablet (2 mg total) by mouth daily before breakfast.  30 tablet  5  . glucose blood test strip Contour test strips-Check blood sugar tid (Dx-250.00)  100 each  10  . hydrochlorothiazide (HYDRODIURIL) 25 MG tablet Take 1 tablet (25 mg total) by mouth daily.  90 tablet  1  . metoCLOPramide (REGLAN) 5 MG tablet Take 5 mg by mouth 2 (two) times daily.       . metoprolol (LOPRESSOR) 50 MG tablet 1 1/2  tablet bid  270 tablet  1  . pravastatin (PRAVACHOL) 40 MG tablet Take 1 tablet (40 mg total) by mouth daily.  90 tablet  3   No current facility-administered medications on file prior to visit.    Review of Systems Patient denies any headache, lightheadedness or dizziness. No sinus or allergy symptoms.  No chest pain, tightness or palpitations.  No increased shortness of breath, cough or congestion.  No nausea or vomiting.   No abdominal pain or cramping. No acid reflux.  No bowel change, such as diarrhea, constipation, BRBPR or melana.  No urine change.  Hip better.  Sugars as outlined.       Objective:   Physical Exam  Filed Vitals:   05/05/13 1010  BP: 130/70  Pulse: 65  Temp: 98.5 F (36.9 C)   Pulse recheck 64, blood pressure recheck:  74/55  79 year old female in no acute distress.   HEENT:  Nares- clear.  Oropharynx - without lesions. NECK:  Supple.  Nontender.  No audible bruit.  HEART:  Appears to be regular. LUNGS:  No crackles or wheezing audible.  Respirations even and unlabored.  RADIAL PULSE:  Equal bilaterally.  ABDOMEN:  Soft, nontender.  Bowel  sounds present and normal.  No audible abdominal bruit.   EXTREMITIES:  No increased edema present.  DP pulses palpable and equal bilaterally.      Assessment & Plan:  CARDIOVASCULAR.  ECHO 7/11 revealed EF 60%, mild to moderate mitral insufficiency, mild RV enlargement and mild LA enlargement.  Asymptomatic.  Follow.    PULMONARY.  Breathing stable.    PREVIOUS WEIGHT LOSS.  Weight relatively stable - 155-157 pounds. Follow.   CHEST PAIN/RIB PAIN.  Resolved.    MEDIASTINAL LYMPH NODES.  Noted on CT scan performed in the evaluation of the chest/rib pain.  Saw Dr Doylene Canning.  Last CT - no significant change.  Felt no further w/up warranted - per pt.    HERNIA.  Saw Dr Lemar Livings.  S/p repair.  Doing well.  Follow.    HEALTH MAINTENANCE.  Physical 12/02/12.  She is s/p hysterectomy and does not need yearly pap smears.   Colonoscopy 02/28/05 - diverticulosis and internal hemorrhoids.  Mammogram 03/26/11 recommended additional views.  These were performed 03/28/11 - read as BiRADS II.  Results forwarded to Dr Doylene Canning.  Advised six month follow up mammogram.  This was performed 2/13 - read as BiRADS II.  Saw Dr Lemar Livings.  Recommended bilateral mammogram fall 2013.   Per report had mammogram 03/26/12.  Had follow up mammogram 04/01/13 - Birads II.

## 2013-05-07 NOTE — Assessment & Plan Note (Signed)
Blood pressure is doing well.  Same medication regimen.  Follow metabolic panel.  Follow pressures.    

## 2013-05-07 NOTE — Assessment & Plan Note (Signed)
Sugars as outlined.  Off insulin.  On amaryl 2mg  q day.  No significant lows.  Discussed eating regular meals.  Appropriate snacks.  Continue to check and record sugars bid.  Sugars attached.  Hold on making changes.  Follow.  Sugars as outlined.

## 2013-05-07 NOTE — Assessment & Plan Note (Signed)
Previous EGD revealed findings c/w gastroparesis.  On reglan and doing better.  Continues follow up with GI - regarding duration of reglan treatment.   

## 2013-05-07 NOTE — Telephone Encounter (Signed)
Corrected directions on test strips

## 2013-05-11 DIAGNOSIS — K5900 Constipation, unspecified: Secondary | ICD-10-CM | POA: Diagnosis not present

## 2013-05-11 DIAGNOSIS — K5730 Diverticulosis of large intestine without perforation or abscess without bleeding: Secondary | ICD-10-CM | POA: Diagnosis not present

## 2013-05-11 DIAGNOSIS — K3184 Gastroparesis: Secondary | ICD-10-CM | POA: Diagnosis not present

## 2013-05-21 ENCOUNTER — Other Ambulatory Visit: Payer: Self-pay | Admitting: *Deleted

## 2013-05-21 MED ORDER — AMLODIPINE BESYLATE 5 MG PO TABS: 5.0000 mg | ORAL_TABLET | Freq: Every day | ORAL | Status: DC

## 2013-05-25 ENCOUNTER — Other Ambulatory Visit: Payer: Self-pay | Admitting: *Deleted

## 2013-05-25 MED ORDER — METOPROLOL TARTRATE 50 MG PO TABS: ORAL_TABLET | ORAL | Status: DC

## 2013-07-01 DIAGNOSIS — M810 Age-related osteoporosis without current pathological fracture: Secondary | ICD-10-CM | POA: Diagnosis not present

## 2013-08-18 ENCOUNTER — Ambulatory Visit: Payer: Self-pay | Admitting: Ophthalmology

## 2013-08-18 DIAGNOSIS — Z01810 Encounter for preprocedural cardiovascular examination: Secondary | ICD-10-CM | POA: Diagnosis not present

## 2013-08-18 DIAGNOSIS — I10 Essential (primary) hypertension: Secondary | ICD-10-CM | POA: Diagnosis not present

## 2013-08-18 DIAGNOSIS — H2510 Age-related nuclear cataract, unspecified eye: Secondary | ICD-10-CM | POA: Diagnosis not present

## 2013-08-18 DIAGNOSIS — Z01812 Encounter for preprocedural laboratory examination: Secondary | ICD-10-CM | POA: Diagnosis not present

## 2013-08-18 LAB — POTASSIUM: Potassium: 3.4 mmol/L — ABNORMAL LOW (ref 3.5–5.1)

## 2013-08-24 ENCOUNTER — Ambulatory Visit: Payer: Self-pay | Admitting: Ophthalmology

## 2013-08-24 DIAGNOSIS — I10 Essential (primary) hypertension: Secondary | ICD-10-CM | POA: Diagnosis not present

## 2013-08-24 DIAGNOSIS — I440 Atrioventricular block, first degree: Secondary | ICD-10-CM | POA: Diagnosis not present

## 2013-08-24 DIAGNOSIS — K5730 Diverticulosis of large intestine without perforation or abscess without bleeding: Secondary | ICD-10-CM | POA: Diagnosis not present

## 2013-08-24 DIAGNOSIS — H2510 Age-related nuclear cataract, unspecified eye: Secondary | ICD-10-CM | POA: Diagnosis not present

## 2013-08-24 DIAGNOSIS — H269 Unspecified cataract: Secondary | ICD-10-CM | POA: Diagnosis not present

## 2013-08-24 DIAGNOSIS — R609 Edema, unspecified: Secondary | ICD-10-CM | POA: Diagnosis not present

## 2013-08-24 DIAGNOSIS — H9190 Unspecified hearing loss, unspecified ear: Secondary | ICD-10-CM | POA: Diagnosis not present

## 2013-08-24 DIAGNOSIS — Z79899 Other long term (current) drug therapy: Secondary | ICD-10-CM | POA: Diagnosis not present

## 2013-08-24 DIAGNOSIS — Z87891 Personal history of nicotine dependence: Secondary | ICD-10-CM | POA: Diagnosis not present

## 2013-08-24 DIAGNOSIS — I4510 Unspecified right bundle-branch block: Secondary | ICD-10-CM | POA: Diagnosis not present

## 2013-08-24 DIAGNOSIS — G589 Mononeuropathy, unspecified: Secondary | ICD-10-CM | POA: Diagnosis not present

## 2013-08-24 DIAGNOSIS — K3184 Gastroparesis: Secondary | ICD-10-CM | POA: Diagnosis not present

## 2013-08-24 DIAGNOSIS — Z880 Allergy status to penicillin: Secondary | ICD-10-CM | POA: Diagnosis not present

## 2013-08-24 DIAGNOSIS — M7989 Other specified soft tissue disorders: Secondary | ICD-10-CM | POA: Diagnosis not present

## 2013-08-31 ENCOUNTER — Ambulatory Visit: Payer: Self-pay | Admitting: Ophthalmology

## 2013-08-31 DIAGNOSIS — H9190 Unspecified hearing loss, unspecified ear: Secondary | ICD-10-CM | POA: Diagnosis not present

## 2013-08-31 DIAGNOSIS — I10 Essential (primary) hypertension: Secondary | ICD-10-CM | POA: Diagnosis not present

## 2013-08-31 DIAGNOSIS — Z79899 Other long term (current) drug therapy: Secondary | ICD-10-CM | POA: Diagnosis not present

## 2013-08-31 DIAGNOSIS — Z880 Allergy status to penicillin: Secondary | ICD-10-CM | POA: Diagnosis not present

## 2013-08-31 DIAGNOSIS — Z7982 Long term (current) use of aspirin: Secondary | ICD-10-CM | POA: Diagnosis not present

## 2013-08-31 DIAGNOSIS — G609 Hereditary and idiopathic neuropathy, unspecified: Secondary | ICD-10-CM | POA: Diagnosis not present

## 2013-08-31 DIAGNOSIS — E119 Type 2 diabetes mellitus without complications: Secondary | ICD-10-CM | POA: Diagnosis not present

## 2013-08-31 DIAGNOSIS — E7800 Pure hypercholesterolemia, unspecified: Secondary | ICD-10-CM | POA: Diagnosis not present

## 2013-08-31 DIAGNOSIS — H59029 Cataract (lens) fragments in eye following cataract surgery, unspecified eye: Secondary | ICD-10-CM | POA: Diagnosis not present

## 2013-08-31 DIAGNOSIS — K219 Gastro-esophageal reflux disease without esophagitis: Secondary | ICD-10-CM | POA: Diagnosis not present

## 2013-09-01 ENCOUNTER — Ambulatory Visit: Payer: Medicare Other | Admitting: Internal Medicine

## 2013-09-07 ENCOUNTER — Other Ambulatory Visit: Payer: Self-pay | Admitting: Internal Medicine

## 2013-09-16 DIAGNOSIS — M79609 Pain in unspecified limb: Secondary | ICD-10-CM | POA: Diagnosis not present

## 2013-09-16 DIAGNOSIS — B351 Tinea unguium: Secondary | ICD-10-CM | POA: Diagnosis not present

## 2013-09-25 ENCOUNTER — Telehealth: Payer: Self-pay | Admitting: Internal Medicine

## 2013-09-25 NOTE — Telephone Encounter (Signed)
I can see her 10/02/13 at 11:45 if no one else is to be put in this spot.

## 2013-09-25 NOTE — Telephone Encounter (Signed)
Pt has recently had eye surgery.  States she had to cancel previous appt due to the surgery.  Will be having another surgery in a few weeks.  Would like to be seen by Dr. Nicki Reaper within the next couple weeks if possible.  No 30 minute slots available.  Please advise.

## 2013-09-25 NOTE — Telephone Encounter (Signed)
Appointment made pt aware of appointment

## 2013-10-02 ENCOUNTER — Ambulatory Visit (INDEPENDENT_AMBULATORY_CARE_PROVIDER_SITE_OTHER): Payer: Medicare Other | Admitting: Internal Medicine

## 2013-10-02 ENCOUNTER — Encounter (INDEPENDENT_AMBULATORY_CARE_PROVIDER_SITE_OTHER): Payer: Self-pay

## 2013-10-02 ENCOUNTER — Encounter: Payer: Self-pay | Admitting: Internal Medicine

## 2013-10-02 VITALS — BP 132/60 | HR 60 | Temp 97.9°F | Resp 16 | Ht 65.5 in | Wt 160.8 lb

## 2013-10-02 DIAGNOSIS — E119 Type 2 diabetes mellitus without complications: Secondary | ICD-10-CM | POA: Diagnosis not present

## 2013-10-02 DIAGNOSIS — K3184 Gastroparesis: Secondary | ICD-10-CM

## 2013-10-02 DIAGNOSIS — E7800 Pure hypercholesterolemia, unspecified: Secondary | ICD-10-CM | POA: Diagnosis not present

## 2013-10-02 DIAGNOSIS — I10 Essential (primary) hypertension: Secondary | ICD-10-CM | POA: Diagnosis not present

## 2013-10-02 NOTE — Progress Notes (Signed)
Pre visit review using our clinic review tool, if applicable. No additional management support is needed unless otherwise documented below in the visit note. 

## 2013-10-06 ENCOUNTER — Encounter: Payer: Self-pay | Admitting: Internal Medicine

## 2013-10-06 NOTE — Assessment & Plan Note (Signed)
Previous EGD revealed findings c/w gastroparesis.  On reglan and doing better.  Continues follow up with GI - regarding duration of reglan treatment.   

## 2013-10-06 NOTE — Assessment & Plan Note (Signed)
Sugars as outlined.  Off insulin.  On amaryl 2mg  q day.  No significant lows.  Have discussed eating regular meals.  Appropriate snacks.  Continue to check and record sugars bid.  Hold on making changes.  Follow.

## 2013-10-06 NOTE — Progress Notes (Signed)
Subjective:    Patient ID: Debra Kirk, female    DOB: 1930/01/01, 80 y.o.   MRN: [id number]  HPI 80 year old female with past history of hypertension, hypercholesterolemia, diabetes and mononeuritis involving the left leg who comes in today for a scheduled follow up. She states she is doing well.  Was having problems with her hip, but is s/p injection and now hip is ok.  On oral amaryl.  Off insulin.  Did not tolerate metformin.  No significant problems with low blood sugars.  AM sugars averaging 130-140 and PM sugars averaging 160 (with highest 200).   No chest pain or tightness.  Breathing stable.  Bowels stable.  She is s/p right eye surgery 08/24/13.  Had cataract surgery and had to have repeat surgery a few days later.  Apparently had part of the cataract left in her eye and her pressure went up.  Has been seeing opthalmology regularly.  Pressure down now.  Vision 20/20 now.      Past Medical History  Diagnosis Date  . Hypertension   . Diabetes mellitus   . Hypercholesterolemia   . Femoral neuropathy     left, elevated CRP, ESR, negative FANA and ANCA  . Carotid artery disease   . Compression fracture     s/p fosamax, prolia  . Fibrocystic breast disease     Current Outpatient Prescriptions on File Prior to Visit  Medication Sig Dispense Refill  . amLODipine (NORVASC) 5 MG tablet Take 1 tablet (5 mg total) by mouth daily.  90 tablet  1  . aspirin 81 MG tablet Take 81 mg by mouth daily.      . Calcium Carbonate-Vitamin D (CALCIUM 600+D) 600-400 MG-UNIT per tablet Take 1 tablet by mouth 2 (two) times daily.      Marland Kitchen denosumab (PROLIA) 60 MG/ML SOLN injection Inject 60 mg into the skin every 6 (six) months. Administer in upper arm, thigh, or abdomen      . glimepiride (AMARYL) 2 MG tablet TAKE 1 TABLET BY MOUTH DAILY BEFORE BREAKFAST.  30 tablet  5  . glucose blood test strip Contour test strips-Check blood sugar daily  50 each  10  . hydrochlorothiazide (HYDRODIURIL) 25 MG tablet Take 1  tablet (25 mg total) by mouth daily.  90 tablet  1  . metoCLOPramide (REGLAN) 5 MG tablet Take 5 mg by mouth 2 (two) times daily.       . metoprolol (LOPRESSOR) 50 MG tablet 1 1/2 tablet bid  270 tablet  1  . omeprazole (PRILOSEC) 20 MG capsule Take 1 capsule (20 mg total) by mouth 2 (two) times daily.  180 capsule  1  . pravastatin (PRAVACHOL) 40 MG tablet Take 1 tablet (40 mg total) by mouth daily.  90 tablet  3   No current facility-administered medications on file prior to visit.    Review of Systems Patient denies any headache, lightheadedness or dizziness. No sinus or allergy symptoms.  No chest pain, tightness or palpitations.  No increased shortness of breath, cough or congestion.  No nausea or vomiting.   No abdominal pain or cramping. No acid reflux.  No bowel change, such as diarrhea, constipation, BRBPR or melana.  No urine change.  Hip better.  Sugars as outlined.  Eye doing better.       Objective:   Physical Exam  Filed Vitals:   10/02/13 1140  BP: 132/60  Pulse: 60  Temp: 97.9 F (36.6 C)  Resp: 16   Blood  pressure recheck 36/7  80 year old female in no acute distress.   HEENT:  Nares- clear.  Oropharynx - without lesions. NECK:  Supple.  Nontender.  No audible bruit.  HEART:  Appears to be regular. LUNGS:  No crackles or wheezing audible.  Respirations even and unlabored.  RADIAL PULSE:  Equal bilaterally.  ABDOMEN:  Soft, nontender.  Bowel sounds present and normal.  No audible abdominal bruit.   EXTREMITIES:  No increased edema present.  DP pulses palpable and equal bilaterally.  SKIN:  No lesions.       Assessment & Plan:  CARDIOVASCULAR.  ECHO 7/11 revealed EF 60%, mild to moderate mitral insufficiency, mild RV enlargement and mild LA enlargement.  Asymptomatic.  Follow.    PULMONARY.  Breathing stable.    CHEST PAIN/RIB PAIN.  Resolved.    MEDIASTINAL LYMPH NODES.  Noted on CT scan performed in the evaluation of the chest/rib pain.  Saw Dr Oliva Bustard.   Last CT - no significant change.  Felt no further w/up warranted - per pt.    HERNIA.  Saw Dr Bary Castilla.  S/p repair.  Doing well.  Follow.    HEALTH MAINTENANCE.  Physical 12/02/12.  She is s/p hysterectomy and does not need yearly pap smears.  Colonoscopy 02/28/05 - diverticulosis and internal hemorrhoids.  Mammogram 03/26/11 recommended additional views.  These were performed 03/28/11 - read as BiRADS II.  Results forwarded to Dr Oliva Bustard.  Advised six month follow up mammogram.  This was performed 2/13 - read as BiRADS II.  Saw Dr Bary Castilla.  Recommended bilateral mammogram fall 2013.   Per report had mammogram 03/26/12.  Had follow up mammogram 04/01/13 - Birads II.

## 2013-10-06 NOTE — Assessment & Plan Note (Signed)
On pravastatin.  Low cholesterol diet and exercise.  Follow lipid panel and liver function.    

## 2013-10-06 NOTE — Assessment & Plan Note (Signed)
Blood pressure is doing well.  Same medication regimen.  Follow metabolic panel.  Follow pressures.    

## 2013-10-07 ENCOUNTER — Other Ambulatory Visit: Payer: Medicare Other

## 2013-10-13 ENCOUNTER — Other Ambulatory Visit: Payer: Medicare Other

## 2013-10-16 ENCOUNTER — Other Ambulatory Visit: Payer: Medicare Other

## 2013-10-20 ENCOUNTER — Other Ambulatory Visit (INDEPENDENT_AMBULATORY_CARE_PROVIDER_SITE_OTHER): Payer: Medicare Other

## 2013-10-20 DIAGNOSIS — E119 Type 2 diabetes mellitus without complications: Secondary | ICD-10-CM

## 2013-10-20 DIAGNOSIS — I10 Essential (primary) hypertension: Secondary | ICD-10-CM

## 2013-10-20 DIAGNOSIS — E7800 Pure hypercholesterolemia, unspecified: Secondary | ICD-10-CM

## 2013-10-20 LAB — URINALYSIS, ROUTINE W REFLEX MICROSCOPIC
BILIRUBIN URINE: NEGATIVE
HGB URINE DIPSTICK: NEGATIVE
Ketones, ur: NEGATIVE
Leukocytes, UA: NEGATIVE
NITRITE: NEGATIVE
RBC / HPF: NONE SEEN (ref 0–?)
Specific Gravity, Urine: 1.015 (ref 1.000–1.030)
Total Protein, Urine: NEGATIVE
UROBILINOGEN UA: 0.2 (ref 0.0–1.0)
Urine Glucose: NEGATIVE
pH: 7 (ref 5.0–8.0)

## 2013-10-20 LAB — HEPATIC FUNCTION PANEL
ALT: 14 U/L (ref 0–35)
AST: 17 U/L (ref 0–37)
Albumin: 3.8 g/dL (ref 3.5–5.2)
Alkaline Phosphatase: 39 U/L (ref 39–117)
BILIRUBIN TOTAL: 0.8 mg/dL (ref 0.3–1.2)
Bilirubin, Direct: 0.1 mg/dL (ref 0.0–0.3)
Total Protein: 7.4 g/dL (ref 6.0–8.3)

## 2013-10-20 LAB — LIPID PANEL
CHOL/HDL RATIO: 5
Cholesterol: 200 mg/dL (ref 0–200)
HDL: 43.4 mg/dL (ref >39.00)
LDL Cholesterol: 128 mg/dL — ABNORMAL HIGH (ref 0–99)
Triglycerides: 141 mg/dL (ref 0.0–149.0)
VLDL: 28.2 mg/dL (ref 0.0–40.0)

## 2013-10-20 LAB — BASIC METABOLIC PANEL
BUN: 16 mg/dL (ref 6–23)
CALCIUM: 9.1 mg/dL (ref 8.4–10.5)
CO2: 28 meq/L (ref 19–32)
Chloride: 97 mEq/L (ref 96–112)
Creatinine, Ser: 0.9 mg/dL (ref 0.4–1.2)
GFR: 60.6 mL/min (ref >60.00)
GLUCOSE: 165 mg/dL — AB (ref 70–99)
Potassium: 3.8 mEq/L (ref 3.5–5.1)
SODIUM: 133 meq/L — AB (ref 135–145)

## 2013-10-20 LAB — HEMOGLOBIN A1C: HEMOGLOBIN A1C: 7.1 % — AB (ref 4.6–6.5)

## 2013-10-21 ENCOUNTER — Other Ambulatory Visit: Payer: Self-pay | Admitting: Internal Medicine

## 2013-10-21 DIAGNOSIS — E871 Hypo-osmolality and hyponatremia: Secondary | ICD-10-CM

## 2013-10-21 NOTE — Progress Notes (Signed)
Order placed for f/u lab.   

## 2013-10-22 ENCOUNTER — Other Ambulatory Visit: Payer: Self-pay | Admitting: *Deleted

## 2013-10-22 MED ORDER — ATORVASTATIN CALCIUM 20 MG PO TABS: 20.0000 mg | ORAL_TABLET | Freq: Every day | ORAL | Status: DC

## 2013-10-22 NOTE — Progress Notes (Signed)
LMTCB

## 2013-10-26 ENCOUNTER — Ambulatory Visit: Payer: Self-pay | Admitting: Ophthalmology

## 2013-10-26 DIAGNOSIS — Z01812 Encounter for preprocedural laboratory examination: Secondary | ICD-10-CM | POA: Diagnosis not present

## 2013-10-26 DIAGNOSIS — H2510 Age-related nuclear cataract, unspecified eye: Secondary | ICD-10-CM | POA: Diagnosis not present

## 2013-10-26 LAB — POTASSIUM: POTASSIUM: 3.7 mmol/L (ref 3.5–5.1)

## 2013-10-27 ENCOUNTER — Other Ambulatory Visit: Payer: Self-pay | Admitting: Internal Medicine

## 2013-10-28 ENCOUNTER — Other Ambulatory Visit (INDEPENDENT_AMBULATORY_CARE_PROVIDER_SITE_OTHER): Payer: Medicare Other

## 2013-10-28 DIAGNOSIS — E871 Hypo-osmolality and hyponatremia: Secondary | ICD-10-CM

## 2013-10-28 LAB — SODIUM: Sodium: 136 mEq/L (ref 135–145)

## 2013-10-28 NOTE — Addendum Note (Signed)
Addended by: Karlene Einstein D on: 10/28/2013 09:19 AM   Modules accepted: Orders

## 2013-10-29 ENCOUNTER — Encounter: Payer: Self-pay | Admitting: *Deleted

## 2013-11-02 ENCOUNTER — Ambulatory Visit: Payer: Self-pay | Admitting: Ophthalmology

## 2013-11-02 DIAGNOSIS — Z880 Allergy status to penicillin: Secondary | ICD-10-CM | POA: Diagnosis not present

## 2013-11-02 DIAGNOSIS — G589 Mononeuropathy, unspecified: Secondary | ICD-10-CM | POA: Diagnosis not present

## 2013-11-02 DIAGNOSIS — E7800 Pure hypercholesterolemia, unspecified: Secondary | ICD-10-CM | POA: Diagnosis not present

## 2013-11-02 DIAGNOSIS — K3184 Gastroparesis: Secondary | ICD-10-CM | POA: Diagnosis not present

## 2013-11-02 DIAGNOSIS — H269 Unspecified cataract: Secondary | ICD-10-CM | POA: Diagnosis not present

## 2013-11-02 DIAGNOSIS — E119 Type 2 diabetes mellitus without complications: Secondary | ICD-10-CM | POA: Diagnosis not present

## 2013-11-02 DIAGNOSIS — I10 Essential (primary) hypertension: Secondary | ICD-10-CM | POA: Diagnosis not present

## 2013-11-02 DIAGNOSIS — H2510 Age-related nuclear cataract, unspecified eye: Secondary | ICD-10-CM | POA: Diagnosis not present

## 2013-11-02 DIAGNOSIS — Z79899 Other long term (current) drug therapy: Secondary | ICD-10-CM | POA: Diagnosis not present

## 2013-11-02 DIAGNOSIS — R609 Edema, unspecified: Secondary | ICD-10-CM | POA: Diagnosis not present

## 2013-11-10 ENCOUNTER — Other Ambulatory Visit: Payer: Self-pay | Admitting: Internal Medicine

## 2013-11-16 ENCOUNTER — Other Ambulatory Visit: Payer: Self-pay | Admitting: Internal Medicine

## 2013-12-08 ENCOUNTER — Telehealth: Payer: Self-pay | Admitting: *Deleted

## 2013-12-08 DIAGNOSIS — E7800 Pure hypercholesterolemia, unspecified: Secondary | ICD-10-CM

## 2013-12-08 NOTE — Telephone Encounter (Signed)
Order placed for f/u liver panel.  

## 2013-12-08 NOTE — Telephone Encounter (Signed)
Pt is coming in tomorrow what labs and dx?  

## 2013-12-09 ENCOUNTER — Encounter: Payer: Self-pay | Admitting: *Deleted

## 2013-12-09 ENCOUNTER — Other Ambulatory Visit: Payer: Self-pay | Admitting: Internal Medicine

## 2013-12-09 ENCOUNTER — Other Ambulatory Visit (INDEPENDENT_AMBULATORY_CARE_PROVIDER_SITE_OTHER): Payer: Medicare Other

## 2013-12-09 DIAGNOSIS — E119 Type 2 diabetes mellitus without complications: Secondary | ICD-10-CM

## 2013-12-09 DIAGNOSIS — E7800 Pure hypercholesterolemia, unspecified: Secondary | ICD-10-CM

## 2013-12-09 DIAGNOSIS — K3184 Gastroparesis: Secondary | ICD-10-CM

## 2013-12-09 DIAGNOSIS — I10 Essential (primary) hypertension: Secondary | ICD-10-CM

## 2013-12-09 LAB — HEPATIC FUNCTION PANEL
ALK PHOS: 41 U/L (ref 39–117)
ALT: 14 U/L (ref 0–35)
AST: 19 U/L (ref 0–37)
Albumin: 3.7 g/dL (ref 3.5–5.2)
Bilirubin, Direct: 0.1 mg/dL (ref 0.0–0.3)
Total Bilirubin: 0.8 mg/dL (ref 0.3–1.2)
Total Protein: 7.1 g/dL (ref 6.0–8.3)

## 2013-12-09 NOTE — Progress Notes (Signed)
Order placed for f/u labs.  

## 2014-01-01 ENCOUNTER — Encounter: Payer: Medicare Other | Admitting: Internal Medicine

## 2014-02-08 ENCOUNTER — Other Ambulatory Visit: Payer: Medicare Other

## 2014-02-10 ENCOUNTER — Encounter: Payer: Medicare Other | Admitting: Internal Medicine

## 2014-02-17 DIAGNOSIS — L819 Disorder of pigmentation, unspecified: Secondary | ICD-10-CM | POA: Diagnosis not present

## 2014-02-17 DIAGNOSIS — D485 Neoplasm of uncertain behavior of skin: Secondary | ICD-10-CM | POA: Diagnosis not present

## 2014-02-17 DIAGNOSIS — D2330 Other benign neoplasm of skin of unspecified part of face: Secondary | ICD-10-CM | POA: Diagnosis not present

## 2014-02-17 DIAGNOSIS — L821 Other seborrheic keratosis: Secondary | ICD-10-CM | POA: Diagnosis not present

## 2014-02-17 DIAGNOSIS — L570 Actinic keratosis: Secondary | ICD-10-CM | POA: Diagnosis not present

## 2014-02-17 DIAGNOSIS — D239 Other benign neoplasm of skin, unspecified: Secondary | ICD-10-CM | POA: Diagnosis not present

## 2014-02-17 DIAGNOSIS — D1801 Hemangioma of skin and subcutaneous tissue: Secondary | ICD-10-CM | POA: Diagnosis not present

## 2014-02-24 ENCOUNTER — Other Ambulatory Visit (INDEPENDENT_AMBULATORY_CARE_PROVIDER_SITE_OTHER): Payer: Medicare Other

## 2014-02-24 DIAGNOSIS — E7800 Pure hypercholesterolemia, unspecified: Secondary | ICD-10-CM | POA: Diagnosis not present

## 2014-02-24 DIAGNOSIS — K3184 Gastroparesis: Secondary | ICD-10-CM | POA: Diagnosis not present

## 2014-02-24 DIAGNOSIS — M810 Age-related osteoporosis without current pathological fracture: Secondary | ICD-10-CM | POA: Diagnosis not present

## 2014-02-24 DIAGNOSIS — E119 Type 2 diabetes mellitus without complications: Secondary | ICD-10-CM | POA: Diagnosis not present

## 2014-02-24 LAB — LIPID PANEL
CHOL/HDL RATIO: 4
Cholesterol: 173 mg/dL (ref 0–200)
HDL: 43 mg/dL (ref >39.00)
LDL CALC: 108 mg/dL — AB (ref 0–99)
NonHDL: 130
TRIGLYCERIDES: 109 mg/dL (ref 0.0–149.0)
VLDL: 21.8 mg/dL (ref 0.0–40.0)

## 2014-02-24 LAB — CBC WITH DIFFERENTIAL/PLATELET
Basophils Absolute: 0 10*3/uL (ref 0.0–0.1)
Basophils Relative: 0.5 % (ref 0.0–3.0)
Eosinophils Absolute: 0 10*3/uL (ref 0.0–0.7)
Eosinophils Relative: 0.1 % (ref 0.0–5.0)
HEMATOCRIT: 38.7 % (ref 36.0–46.0)
HEMOGLOBIN: 13.1 g/dL (ref 12.0–15.0)
LYMPHS ABS: 1.4 10*3/uL (ref 0.7–4.0)
Lymphocytes Relative: 27 % (ref 12.0–46.0)
MCHC: 33.9 g/dL (ref 30.0–36.0)
MCV: 87.1 fl (ref 78.0–100.0)
MONOS PCT: 8 % (ref 3.0–12.0)
Monocytes Absolute: 0.4 10*3/uL (ref 0.1–1.0)
NEUTROS ABS: 3.4 10*3/uL (ref 1.4–7.7)
Neutrophils Relative %: 64.4 % (ref 43.0–77.0)
Platelets: 212 10*3/uL (ref 150.0–400.0)
RBC: 4.44 Mil/uL (ref 3.87–5.11)
RDW: 14.9 % (ref 11.5–15.5)
WBC: 5.3 10*3/uL (ref 4.0–10.5)

## 2014-02-24 LAB — BASIC METABOLIC PANEL
BUN: 16 mg/dL (ref 6–23)
CHLORIDE: 98 meq/L (ref 96–112)
CO2: 27 mEq/L (ref 19–32)
CREATININE: 0.9 mg/dL (ref 0.4–1.2)
Calcium: 9.4 mg/dL (ref 8.4–10.5)
GFR: 64.49 mL/min (ref >60.00)
Glucose, Bld: 178 mg/dL — ABNORMAL HIGH (ref 70–99)
Potassium: 3.6 mEq/L (ref 3.5–5.1)
SODIUM: 137 meq/L (ref 135–145)

## 2014-02-24 LAB — TSH: TSH: 1.27 u[IU]/mL (ref 0.35–4.50)

## 2014-02-24 LAB — HEPATIC FUNCTION PANEL
ALBUMIN: 3.9 g/dL (ref 3.5–5.2)
ALT: 14 U/L (ref 0–35)
AST: 19 U/L (ref 0–37)
Alkaline Phosphatase: 52 U/L (ref 39–117)
Bilirubin, Direct: 0.1 mg/dL (ref 0.0–0.3)
TOTAL PROTEIN: 7.5 g/dL (ref 6.0–8.3)
Total Bilirubin: 0.9 mg/dL (ref 0.2–1.2)

## 2014-02-24 LAB — MICROALBUMIN / CREATININE URINE RATIO
Creatinine,U: 238.3 mg/dL
Microalb Creat Ratio: 0.6 mg/g (ref 0.0–30.0)
Microalb, Ur: 1.4 mg/dL (ref 0.0–1.9)

## 2014-02-24 LAB — HEMOGLOBIN A1C: HEMOGLOBIN A1C: 7.3 % — AB (ref 4.6–6.5)

## 2014-02-26 ENCOUNTER — Encounter (INDEPENDENT_AMBULATORY_CARE_PROVIDER_SITE_OTHER): Payer: Self-pay

## 2014-02-26 ENCOUNTER — Ambulatory Visit (INDEPENDENT_AMBULATORY_CARE_PROVIDER_SITE_OTHER): Payer: Medicare Other | Admitting: Internal Medicine

## 2014-02-26 ENCOUNTER — Encounter: Payer: Self-pay | Admitting: Internal Medicine

## 2014-02-26 VITALS — BP 140/70 | HR 58 | Temp 98.4°F | Ht 66.0 in | Wt 154.5 lb

## 2014-02-26 DIAGNOSIS — G609 Hereditary and idiopathic neuropathy, unspecified: Secondary | ICD-10-CM

## 2014-02-26 DIAGNOSIS — I10 Essential (primary) hypertension: Secondary | ICD-10-CM

## 2014-02-26 DIAGNOSIS — E7800 Pure hypercholesterolemia, unspecified: Secondary | ICD-10-CM

## 2014-02-26 DIAGNOSIS — E1142 Type 2 diabetes mellitus with diabetic polyneuropathy: Secondary | ICD-10-CM

## 2014-02-26 DIAGNOSIS — K3184 Gastroparesis: Secondary | ICD-10-CM

## 2014-02-26 DIAGNOSIS — G629 Polyneuropathy, unspecified: Secondary | ICD-10-CM

## 2014-02-26 DIAGNOSIS — Z1239 Encounter for other screening for malignant neoplasm of breast: Secondary | ICD-10-CM

## 2014-02-26 DIAGNOSIS — E1149 Type 2 diabetes mellitus with other diabetic neurological complication: Secondary | ICD-10-CM

## 2014-02-26 DIAGNOSIS — E1140 Type 2 diabetes mellitus with diabetic neuropathy, unspecified: Secondary | ICD-10-CM

## 2014-02-26 MED ORDER — GLIMEPIRIDE 2 MG PO TABS: 2.0000 mg | ORAL_TABLET | Freq: Every day | ORAL | Status: DC

## 2014-02-26 NOTE — Progress Notes (Signed)
Pre visit review using our clinic review tool, if applicable. No additional management support is needed unless otherwise documented below in the visit note. 

## 2014-03-03 ENCOUNTER — Encounter: Payer: Self-pay | Admitting: Internal Medicine

## 2014-03-03 DIAGNOSIS — G629 Polyneuropathy, unspecified: Secondary | ICD-10-CM | POA: Insufficient documentation

## 2014-03-03 NOTE — Progress Notes (Signed)
Subjective:    Patient ID: Debra Kirk, female    DOB: 1930-04-09, 80 y.o.   MRN: [id number]  HPI 80 year old female with past history of hypertension, hypercholesterolemia, diabetes and mononeuritis involving the left leg who comes in today to follow up on these issues as well as for a complete physical exam.   She states she is doing well.  Was having problems with her hip, but is s/p injection and now hip is ok.  On oral amaryl.  Off insulin.  Did not tolerate metformin.  No significant problems with low blood sugars.  States sugars doing well.  Brought in no recorded sugar readings.   No chest pain or tightness.  Breathing stable.  Bowels stable.  She is s/p right eye surgery 08/24/13.  Had cataract surgery and had to have repeat surgery a few days later.  Apparently had part of the cataract left in her eye and her pressure went up.  Has been seeing opthalmology regularly.  Pressure down now.  Vision 20/20 now.  Overall she feels good and feels that she is doing well.      Past Medical History  Diagnosis Date  . Hypertension   . Diabetes mellitus   . Hypercholesterolemia   . Femoral neuropathy     left, elevated CRP, ESR, negative FANA and ANCA  . Carotid artery disease   . Compression fracture     s/p fosamax, prolia  . Fibrocystic breast disease     Current Outpatient Prescriptions on File Prior to Visit  Medication Sig Dispense Refill  . amLODipine (NORVASC) 5 MG tablet TAKE 1 TABLET (5 MG TOTAL) BY MOUTH DAILY.  90 tablet  1  . aspirin 81 MG tablet Take 81 mg by mouth daily.      . Calcium Carbonate-Vitamin D (CALCIUM 600+D) 600-400 MG-UNIT per tablet Take 1 tablet by mouth 2 (two) times daily.      Marland Kitchen denosumab (PROLIA) 60 MG/ML SOLN injection Inject 60 mg into the skin every 6 (six) months. Administer in upper arm, thigh, or abdomen      . glucose blood test strip Contour test strips-Check blood sugar daily  50 each  10  . hydrochlorothiazide (HYDRODIURIL) 25 MG tablet TAKE 1  TABLET BY MOUTH EVERY DAY  90 tablet  1  . metoCLOPramide (REGLAN) 5 MG tablet Take 5 mg by mouth 2 (two) times daily.       . metoprolol (LOPRESSOR) 50 MG tablet TAKE 1 AND 1/2 TABLET BY MOUTH TWICE A DAY  270 tablet  1  . omeprazole (PRILOSEC) 20 MG capsule TAKE ONE CAPSULE BY MOUTH TWICE A DAY  180 capsule  1   No current facility-administered medications on file prior to visit.    Review of Systems Patient denies any headache, lightheadedness or dizziness.  No sinus or allergy symptoms.  No chest pain, tightness or palpitations.  No increased shortness of breath, cough or congestion.  No nausea or vomiting.   No abdominal pain or cramping. No acid reflux.  No bowel change, such as diarrhea, constipation, BRBPR or melana.  No urine change.  Hip better.  Brought in no recorded sugar readings.  Feels good.       Objective:   Physical Exam  Filed Vitals:   02/26/14 1111  BP: 140/70  Pulse: 58  Temp: 98.4 F (36.9 C)   Blood pressure recheck 77/34  80 year old female in no acute distress.   HEENT:  Nares- clear.  Oropharynx - without lesions. NECK:  Supple.  Nontender.  No audible bruit.  HEART:  Appears to be regular. LUNGS:  No crackles or wheezing audible.  Respirations even and unlabored.  RADIAL PULSE:  Equal bilaterally.    BREASTS:  No nipple discharge or nipple retraction present.  Could not appreciate any distinct nodules or axillary adenopathy.  ABDOMEN:  Soft, nontender.  Bowel sounds present and normal.  No audible abdominal bruit.  GU:  Not performed.   EXTREMITIES:  No increased edema present.  DP pulses palpable and equal bilaterally.      FEET:  No lesions.       Assessment & Plan:  CARDIOVASCULAR.  ECHO 7/11 revealed EF 60%, mild to moderate mitral insufficiency, mild RV enlargement and mild LA enlargement.  Asymptomatic.  Follow.    PULMONARY.  Breathing stable.    CHEST PAIN/RIB PAIN.  Resolved.    MEDIASTINAL LYMPH NODES.  Noted on CT scan performed in  the evaluation of the chest/rib pain.  Saw Dr Oliva Bustard.  Last CT - no significant change.  Felt no further w/up warranted - per pt.    HERNIA.  Saw Dr Bary Castilla.  S/p repair.  Doing well.  Follow.    HEALTH MAINTENANCE.  Physical today.  She is s/p hysterectomy and does not need yearly pap smears.  Colonoscopy 02/28/05 - diverticulosis and internal hemorrhoids.  Mammogram 03/26/11 recommended additional views.  These were performed 03/28/11 - read as BiRADS II.  Results forwarded to Dr Oliva Bustard.  Advised six month follow up mammogram.  This was performed 2/13 - read as BiRADS II.  Saw Dr Bary Castilla.  Recommended bilateral mammogram fall 2013.   Per report had mammogram 03/26/12.  Had follow up mammogram 04/01/13 - Birads II.  Schedule f/u mammogram when due.    I spent 25 minutes with the patient and more than 50% of the time was spent in consultation regarding the above.

## 2014-03-03 NOTE — Assessment & Plan Note (Signed)
Off insulin.  On amaryl 2mg  q day.  No lows.  Have discussed eating regular meals.  Appropriate snacks.  Continue to check and record sugars bid.  Hold on making changes.  Follow.  A1c just checked 7.3.

## 2014-03-03 NOTE — Assessment & Plan Note (Signed)
Stable.  Follow.   

## 2014-03-03 NOTE — Assessment & Plan Note (Signed)
Blood pressure is doing well.  Same medication regimen.  Follow metabolic panel.  Follow pressures.

## 2014-03-03 NOTE — Assessment & Plan Note (Signed)
On pravastatin.  Low cholesterol diet and exercise.  Follow lipid panel and liver function.    

## 2014-03-03 NOTE — Assessment & Plan Note (Signed)
Previous EGD revealed findings c/w gastroparesis.  On reglan and doing better.  Continues follow up with GI - regarding duration of reglan treatment.

## 2014-03-30 DIAGNOSIS — D2330 Other benign neoplasm of skin of unspecified part of face: Secondary | ICD-10-CM | POA: Diagnosis not present

## 2014-03-30 DIAGNOSIS — L570 Actinic keratosis: Secondary | ICD-10-CM | POA: Diagnosis not present

## 2014-03-30 DIAGNOSIS — B079 Viral wart, unspecified: Secondary | ICD-10-CM | POA: Diagnosis not present

## 2014-04-06 ENCOUNTER — Other Ambulatory Visit: Payer: Self-pay | Admitting: Internal Medicine

## 2014-04-07 DIAGNOSIS — J209 Acute bronchitis, unspecified: Secondary | ICD-10-CM | POA: Diagnosis not present

## 2014-04-20 ENCOUNTER — Other Ambulatory Visit: Payer: Self-pay | Admitting: Internal Medicine

## 2014-04-24 ENCOUNTER — Other Ambulatory Visit: Payer: Self-pay | Admitting: Internal Medicine

## 2014-05-04 ENCOUNTER — Other Ambulatory Visit: Payer: Self-pay | Admitting: Internal Medicine

## 2014-05-12 ENCOUNTER — Ambulatory Visit: Payer: Self-pay | Admitting: Internal Medicine

## 2014-05-12 ENCOUNTER — Encounter: Payer: Self-pay | Admitting: *Deleted

## 2014-05-12 DIAGNOSIS — Z1231 Encounter for screening mammogram for malignant neoplasm of breast: Secondary | ICD-10-CM | POA: Diagnosis not present

## 2014-05-12 LAB — HM MAMMOGRAPHY: HM Mammogram: NEGATIVE

## 2014-05-18 ENCOUNTER — Other Ambulatory Visit: Payer: Self-pay | Admitting: *Deleted

## 2014-05-18 MED ORDER — METOPROLOL TARTRATE 50 MG PO TABS: ORAL_TABLET | ORAL | Status: DC

## 2014-05-18 MED ORDER — AMLODIPINE BESYLATE 5 MG PO TABS: ORAL_TABLET | ORAL | Status: DC

## 2014-05-18 NOTE — Addendum Note (Signed)
Addended by: Wynonia Lawman E on: 05/18/2014 04:50 PM   Modules accepted: Orders

## 2014-06-02 DIAGNOSIS — Z23 Encounter for immunization: Secondary | ICD-10-CM | POA: Diagnosis not present

## 2014-06-07 ENCOUNTER — Encounter: Payer: Self-pay | Admitting: Internal Medicine

## 2014-06-28 ENCOUNTER — Other Ambulatory Visit (INDEPENDENT_AMBULATORY_CARE_PROVIDER_SITE_OTHER): Payer: Medicare Other

## 2014-06-28 DIAGNOSIS — E7800 Pure hypercholesterolemia, unspecified: Secondary | ICD-10-CM

## 2014-06-28 DIAGNOSIS — E1140 Type 2 diabetes mellitus with diabetic neuropathy, unspecified: Secondary | ICD-10-CM

## 2014-06-28 LAB — HEPATIC FUNCTION PANEL
ALT: 12 U/L (ref 0–35)
AST: 18 U/L (ref 0–37)
Albumin: 3.4 g/dL — ABNORMAL LOW (ref 3.5–5.2)
Alkaline Phosphatase: 42 U/L (ref 39–117)
BILIRUBIN DIRECT: 0.1 mg/dL (ref 0.0–0.3)
Total Bilirubin: 0.7 mg/dL (ref 0.2–1.2)
Total Protein: 7.3 g/dL (ref 6.0–8.3)

## 2014-06-28 LAB — BASIC METABOLIC PANEL
BUN: 18 mg/dL (ref 6–23)
CHLORIDE: 101 meq/L (ref 96–112)
CO2: 26 meq/L (ref 19–32)
CREATININE: 0.9 mg/dL (ref 0.4–1.2)
Calcium: 9 mg/dL (ref 8.4–10.5)
GFR: 63.61 mL/min (ref >60.00)
GLUCOSE: 172 mg/dL — AB (ref 70–99)
Potassium: 3.5 mEq/L (ref 3.5–5.1)
Sodium: 138 mEq/L (ref 135–145)

## 2014-06-28 LAB — LIPID PANEL
CHOL/HDL RATIO: 5
Cholesterol: 176 mg/dL (ref 0–200)
HDL: 36.1 mg/dL — ABNORMAL LOW (ref >39.00)
LDL Cholesterol: 115 mg/dL — ABNORMAL HIGH (ref 0–99)
NonHDL: 139.9
Triglycerides: 127 mg/dL (ref 0.0–149.0)
VLDL: 25.4 mg/dL (ref 0.0–40.0)

## 2014-06-28 LAB — HEMOGLOBIN A1C: Hgb A1c MFr Bld: 7.2 % — ABNORMAL HIGH (ref 4.6–6.5)

## 2014-06-29 ENCOUNTER — Encounter: Payer: Self-pay | Admitting: Internal Medicine

## 2014-06-29 ENCOUNTER — Ambulatory Visit (INDEPENDENT_AMBULATORY_CARE_PROVIDER_SITE_OTHER): Payer: Medicare Other | Admitting: Internal Medicine

## 2014-06-29 VITALS — BP 135/67 | HR 60 | Temp 97.8°F | Ht 66.0 in | Wt 154.5 lb

## 2014-06-29 DIAGNOSIS — G629 Polyneuropathy, unspecified: Secondary | ICD-10-CM | POA: Diagnosis not present

## 2014-06-29 DIAGNOSIS — E1140 Type 2 diabetes mellitus with diabetic neuropathy, unspecified: Secondary | ICD-10-CM | POA: Diagnosis not present

## 2014-06-29 DIAGNOSIS — E7800 Pure hypercholesterolemia, unspecified: Secondary | ICD-10-CM

## 2014-06-29 DIAGNOSIS — I10 Essential (primary) hypertension: Secondary | ICD-10-CM

## 2014-06-29 NOTE — Progress Notes (Signed)
Pre visit review using our clinic review tool, if applicable. No additional management support is needed unless otherwise documented below in the visit note. 

## 2014-07-04 ENCOUNTER — Encounter: Payer: Self-pay | Admitting: Internal Medicine

## 2014-07-04 NOTE — Progress Notes (Signed)
Subjective:    Patient ID: Debra Kirk, female    DOB: 12/30/1929, 80 y.o.   MRN: [id number]  HPI 80 year old female with past history of hypertension, hypercholesterolemia, diabetes and mononeuritis involving the left leg who comes in today for a scheduled follow up.  She states she is doing well.  Was having problems with her hip, but is s/p injection and now hip is ok.  On oral amaryl.  Off insulin.  Did not tolerate metformin.  No significant problems with low blood sugars.  Brought in no recorded sugar readings.   States am sugars averaging 130-150 and pm sugars averaging 160-210.  A1c improved.  No chest pain or tightness.  Breathing stable.  Bowels stable.  Overall she feels good and feels that she is doing well.      Past Medical History  Diagnosis Date  . Hypertension   . Diabetes mellitus   . Hypercholesterolemia   . Femoral neuropathy     left, elevated CRP, ESR, negative FANA and ANCA  . Carotid artery disease   . Compression fracture     s/p fosamax, prolia  . Fibrocystic breast disease     Current Outpatient Prescriptions on File Prior to Visit  Medication Sig Dispense Refill  . amLODipine (NORVASC) 5 MG tablet TAKE 1 TABLET (5 MG TOTAL) BY MOUTH DAILY. 90 tablet 1  . aspirin 81 MG tablet Take 81 mg by mouth daily.    Marland Kitchen atorvastatin (LIPITOR) 20 MG tablet TAKE 1 TABLET (20 MG TOTAL) BY MOUTH DAILY. 90 tablet 1  . Calcium Carbonate-Vitamin D (CALCIUM 600+D) 600-400 MG-UNIT per tablet Take 1 tablet by mouth 2 (two) times daily.    Marland Kitchen denosumab (PROLIA) 60 MG/ML SOLN injection Inject 60 mg into the skin every 6 (six) months. Administer in upper arm, thigh, or abdomen    . glimepiride (AMARYL) 2 MG tablet Take 1 tablet (2 mg total) by mouth daily with breakfast. 90 tablet 3  . glucose blood test strip Contour test strips-Check blood sugar daily 50 each 10  . hydrochlorothiazide (HYDRODIURIL) 25 MG tablet TAKE 1 TABLET BY MOUTH EVERY DAY 90 tablet 1  . metoCLOPramide  (REGLAN) 5 MG tablet Take 5 mg by mouth 2 (two) times daily.     . metoprolol (LOPRESSOR) 50 MG tablet TAKE 1 AND 1/2 TABLET BY MOUTH TWICE A DAY 270 tablet 1  . omeprazole (PRILOSEC) 20 MG capsule TAKE ONE CAPSULE BY MOUTH TWICE A DAY 180 capsule 1   No current facility-administered medications on file prior to visit.    Review of Systems Patient denies any headache, lightheadedness or dizziness.  No sinus or allergy symptoms.  No chest pain, tightness or palpitations.  No increased shortness of breath, cough or congestion.  No nausea or vomiting.   No abdominal pain or cramping. No acid reflux.  No bowel change, such as diarrhea, constipation, BRBPR or melana.  No urine change.  Hip better.  Sugars as outlined.  Overall feels good.       Objective:   Physical Exam  Filed Vitals:   06/29/14 1349  BP: 135/67  Pulse: 60  Temp: 97.8 F (36.6 C)   Blood pressure recheck 44/31  80 year old female in no acute distress.   HEENT:  Nares- clear.  Oropharynx - without lesions. NECK:  Supple.  Nontender.  No audible bruit.  HEART:  Appears to be regular. LUNGS:  No crackles or wheezing audible.  Respirations even and unlabored.  RADIAL PULSE:  Equal bilaterally.   ABDOMEN:  Soft, nontender.  Bowel sounds present and normal.  No audible abdominal bruit EXTREMITIES:  No increased edema present.  DP pulses palpable and equal bilaterally.      FEET:  No lesions.       Assessment & Plan:  CARDIOVASCULAR.  ECHO 7/11 revealed EF 60%, mild to moderate mitral insufficiency, mild RV enlargement and mild LA enlargement.  Asymptomatic.  Follow.    PULMONARY.  Breathing stable.    CHEST PAIN/RIB PAIN.  Resolved.    MEDIASTINAL LYMPH NODES.  Noted on CT scan performed in the evaluation of the chest/rib pain.  Saw Dr Oliva Bustard.  Last CT - no significant change.  Felt no further w/up warranted - per pt.    HERNIA.  Saw Dr Bary Castilla.  S/p repair.  Doing well.  Follow.   Essential hypertension Blood  pressure doing well.   Same medications.    Type 2 diabetes mellitus with diabetic neuropathy Last a1c improved.  Low carb diet.  Follow.  Sugars as outlined.   Lab Results  Component Value Date   HGBA1C 7.2* 06/28/2014   Peripheral neuropathy Stable.    Hypercholesterolemia Low cholesterol diet and exercise.  Continue lipitor.   Lab Results  Component Value Date   CHOL 176 06/28/2014   HDL 36.10* 06/28/2014   LDLCALC 115* 06/28/2014   TRIG 127.0 06/28/2014   CHOLHDL 5 06/28/2014   HEALTH MAINTENANCE.  Physical 02/26/14.  She is s/p hysterectomy and does not need yearly pap smears.  Colonoscopy 02/28/05 - diverticulosis and internal hemorrhoids.  Mammogram 03/26/11 recommended additional views.  These were performed 03/28/11 - read as BiRADS II.  Results forwarded to Dr Oliva Bustard.  Advised six month follow up mammogram.  This was performed 2/13 - read as BiRADS II.  Saw Dr Bary Castilla.  Recommended bilateral mammogram fall 2013.   Per report had mammogram 03/26/12.  Mammogram 05/12/14 - Birads I.

## 2014-07-27 ENCOUNTER — Other Ambulatory Visit: Payer: Self-pay | Admitting: *Deleted

## 2014-07-27 MED ORDER — GLUCOSE BLOOD VI STRP: ORAL_STRIP | Status: DC

## 2014-08-10 DIAGNOSIS — Z961 Presence of intraocular lens: Secondary | ICD-10-CM | POA: Diagnosis not present

## 2014-08-17 ENCOUNTER — Telehealth: Payer: Self-pay | Admitting: *Deleted

## 2014-08-17 NOTE — Telephone Encounter (Signed)
Pt.notified

## 2014-08-17 NOTE — Telephone Encounter (Signed)
Remain off and continue to monitor sugars.  See if trend down.  Let me know if persistent problems.

## 2014-08-17 NOTE — Telephone Encounter (Signed)
Pt stopped the generic Lipitor early December & pt reports that she is doing much better without it (it was causing sugar increased, was feeling sick on the stomach at times, & increased sweating). All those symptoms have stopped.

## 2014-10-16 ENCOUNTER — Other Ambulatory Visit: Payer: Self-pay | Admitting: Internal Medicine

## 2014-11-01 ENCOUNTER — Other Ambulatory Visit (INDEPENDENT_AMBULATORY_CARE_PROVIDER_SITE_OTHER): Payer: Medicare Other

## 2014-11-01 DIAGNOSIS — E7800 Pure hypercholesterolemia, unspecified: Secondary | ICD-10-CM

## 2014-11-01 DIAGNOSIS — E1140 Type 2 diabetes mellitus with diabetic neuropathy, unspecified: Secondary | ICD-10-CM

## 2014-11-01 DIAGNOSIS — I10 Essential (primary) hypertension: Secondary | ICD-10-CM | POA: Diagnosis not present

## 2014-11-01 LAB — BASIC METABOLIC PANEL
BUN: 15 mg/dL (ref 6–23)
CHLORIDE: 99 meq/L (ref 96–112)
CO2: 30 meq/L (ref 19–32)
CREATININE: 0.95 mg/dL (ref 0.40–1.20)
Calcium: 9.4 mg/dL (ref 8.4–10.5)
GFR: 59.71 mL/min — ABNORMAL LOW (ref >60.00)
Glucose, Bld: 169 mg/dL — ABNORMAL HIGH (ref 70–99)
POTASSIUM: 3.6 meq/L (ref 3.5–5.1)
SODIUM: 135 meq/L (ref 135–145)

## 2014-11-01 LAB — LIPID PANEL
CHOL/HDL RATIO: 5
Cholesterol: 240 mg/dL — ABNORMAL HIGH (ref 0–200)
HDL: 44.6 mg/dL (ref >39.00)
LDL Cholesterol: 172 mg/dL — ABNORMAL HIGH (ref 0–99)
NonHDL: 195.4
Triglycerides: 116 mg/dL (ref 0.0–149.0)
VLDL: 23.2 mg/dL (ref 0.0–40.0)

## 2014-11-01 LAB — HEPATIC FUNCTION PANEL
ALT: 9 U/L (ref 0–35)
AST: 14 U/L (ref 0–37)
Albumin: 4 g/dL (ref 3.5–5.2)
Alkaline Phosphatase: 45 U/L (ref 39–117)
BILIRUBIN DIRECT: 0.1 mg/dL (ref 0.0–0.3)
BILIRUBIN TOTAL: 0.5 mg/dL (ref 0.2–1.2)
Total Protein: 7 g/dL (ref 6.0–8.3)

## 2014-11-01 LAB — HEMOGLOBIN A1C: Hgb A1c MFr Bld: 7.2 % — ABNORMAL HIGH (ref 4.6–6.5)

## 2014-11-02 ENCOUNTER — Encounter: Payer: Self-pay | Admitting: Internal Medicine

## 2014-11-02 ENCOUNTER — Ambulatory Visit (INDEPENDENT_AMBULATORY_CARE_PROVIDER_SITE_OTHER): Payer: Medicare Other | Admitting: Internal Medicine

## 2014-11-02 VITALS — BP 124/80 | HR 65 | Temp 98.1°F | Ht 66.0 in | Wt 157.5 lb

## 2014-11-02 DIAGNOSIS — E7800 Pure hypercholesterolemia, unspecified: Secondary | ICD-10-CM

## 2014-11-02 DIAGNOSIS — I10 Essential (primary) hypertension: Secondary | ICD-10-CM

## 2014-11-02 DIAGNOSIS — K3184 Gastroparesis: Secondary | ICD-10-CM | POA: Diagnosis not present

## 2014-11-02 DIAGNOSIS — E1140 Type 2 diabetes mellitus with diabetic neuropathy, unspecified: Secondary | ICD-10-CM | POA: Diagnosis not present

## 2014-11-02 DIAGNOSIS — Z0000 Encounter for general adult medical examination without abnormal findings: Secondary | ICD-10-CM | POA: Diagnosis not present

## 2014-11-02 MED ORDER — ROSUVASTATIN CALCIUM 5 MG PO TABS: 5.0000 mg | ORAL_TABLET | Freq: Every day | ORAL | Status: DC

## 2014-11-02 MED ORDER — METOPROLOL TARTRATE 50 MG PO TABS: ORAL_TABLET | ORAL | Status: DC

## 2014-11-02 NOTE — Progress Notes (Signed)
Patient ID: Debra Kirk, female   DOB: 11-12-1929, 81 y.o.   MRN: [id number]   Subjective:    Patient ID: Debra Kirk, female    DOB: May 19, 1930, 81 y.o.   MRN: [id number]  HPI  Patient here for a scheduled follow up.  States she is doing well.  Feels good.  Trying to watch her diet and trying to stay active.  States am sugars are averaging 140-150 and sugars prior to bed 170-180.  No lows.  Cholesterol elevated.  Has tried lipitor.  Pravastatin did not control her cholesterol.  Breathing stable.  Bowels stable.    Past Medical History  Diagnosis Date  . Hypertension   . Diabetes mellitus   . Hypercholesterolemia   . Femoral neuropathy     left, elevated CRP, ESR, negative FANA and ANCA  . Carotid artery disease   . Compression fracture     s/p fosamax, prolia  . Fibrocystic breast disease     Current Outpatient Prescriptions on File Prior to Visit  Medication Sig Dispense Refill  . amLODipine (NORVASC) 5 MG tablet TAKE 1 TABLET (5 MG TOTAL) BY MOUTH DAILY. 90 tablet 1  . aspirin 81 MG tablet Take 81 mg by mouth daily.    . Calcium Carbonate-Vitamin D (CALCIUM 600+D) 600-400 MG-UNIT per tablet Take 1 tablet by mouth 2 (two) times daily.    Marland Kitchen denosumab (PROLIA) 60 MG/ML SOLN injection Inject 60 mg into the skin every 6 (six) months. Administer in upper arm, thigh, or abdomen    . glimepiride (AMARYL) 2 MG tablet Take 1 tablet (2 mg total) by mouth daily with breakfast. 90 tablet 3  . glucose blood test strip Contour test strips-Check blood sugar daily 50 each 10  . hydrochlorothiazide (HYDRODIURIL) 25 MG tablet TAKE 1 TABLET BY MOUTH EVERY DAY 90 tablet 1  . metoCLOPramide (REGLAN) 5 MG tablet Take 5 mg by mouth 2 (two) times daily.     Marland Kitchen omeprazole (PRILOSEC) 20 MG capsule TAKE ONE CAPSULE BY MOUTH TWICE A DAY 180 capsule 1   No current facility-administered medications on file prior to visit.    Review of Systems  Constitutional: Negative for appetite change and  unexpected weight change.  HENT: Negative for congestion and sinus pressure.   Respiratory: Negative for cough, chest tightness and shortness of breath.   Cardiovascular: Negative for chest pain, palpitations and leg swelling.  Gastrointestinal: Negative for nausea, vomiting, abdominal pain and diarrhea.  Neurological: Negative for dizziness, light-headedness and headaches.       Objective:     Blood pressure recheck:  132/72  Physical Exam  HENT:  Nose: Nose normal.  Mouth/Throat: Oropharynx is clear and moist.  Neck: Neck supple. No thyromegaly present.  Cardiovascular: Normal rate and regular rhythm.   Pulmonary/Chest: Breath sounds normal. No respiratory distress. She has no wheezes.  Abdominal: Soft. Bowel sounds are normal. There is no tenderness.  Musculoskeletal: She exhibits no edema or tenderness.  Lymphadenopathy:    She has no cervical adenopathy.  Skin: No rash noted. No erythema.    BP 124/80 mmHg  Pulse 65  Temp(Src) 98.1 F (36.7 C) (Oral)  Ht _0  (1.676 m)  Wt 157 lb 8 oz (71.442 kg)  BMI 25.43 kg/m2  SpO2 94% Wt Readings from Last 3 Encounters:  11/02/14 157 lb 8 oz (71.442 kg)  06/29/14 154 lb 8 oz (70.081 kg)  02/26/14 154 lb 8 oz (70.081 kg)     Lab Results  Component Value Date   WBC 5.3 02/24/2014   HGB 13.1 02/24/2014   HCT 38.7 02/24/2014   PLT 212.0 02/24/2014   GLUCOSE 169* 11/01/2014   CHOL 240* 11/01/2014   TRIG 116.0 11/01/2014   HDL 44.60 11/01/2014   LDLCALC 172* 11/01/2014   ALT 9 11/01/2014   AST 14 11/01/2014   NA 135 11/01/2014   K 3.6 11/01/2014   CL 99 11/01/2014   CREATININE 0.95 11/01/2014   BUN 15 11/01/2014   CO2 30 11/01/2014   TSH 1.27 02/24/2014   HGBA1C 7.2* 11/01/2014   MICROALBUR 1.4 02/24/2014       Assessment & Plan:   Problem List Items Addressed This Visit    Diabetes mellitus    Sugars as outlined.  Have been under reasonable control.  Last a1c 11/01/14 - 7.2.  Follow.        Relevant  Medications   rosuvastatin (CRESTOR) tablet   Gastroparesis    Previous EGD revealed findings c/w gastroparesis.  She is doing well.  Followed by GI.       Health care maintenance    Physical 02/26/14.  Colonoscopy 02/18/2005.  Mammogram 05/12/14 - Birads I.       Hypercholesterolemia - Primary    Has tried lipitor and did not tolerate.  Pravastatin did not get the cholesterol to goal.  Start crestor 66m q day.  Check liver panel in 6 weeks.  Follow lipid panel and liver function tests.       Relevant Medications   metoprolol (LOPRESSOR) tablet   rosuvastatin (CRESTOR) tablet   Other Relevant Orders   Hepatic function panel   Hypertension    Blood pressure as outlined.  Overall doing well.  Follow met b and pressures.       Relevant Medications   metoprolol (LOPRESSOR) tablet   rosuvastatin (CRESTOR) tablet       SEinar Pheasant MD

## 2014-11-02 NOTE — Progress Notes (Signed)
Pre visit review using our clinic review tool, if applicable. No additional management support is needed unless otherwise documented below in the visit note. 

## 2014-11-07 ENCOUNTER — Encounter: Payer: Self-pay | Admitting: Internal Medicine

## 2014-11-07 DIAGNOSIS — Z0000 Encounter for general adult medical examination without abnormal findings: Secondary | ICD-10-CM | POA: Insufficient documentation

## 2014-11-07 NOTE — Assessment & Plan Note (Signed)
Previous EGD revealed findings c/w gastroparesis.  She is doing well.  Followed by GI.

## 2014-11-07 NOTE — Assessment & Plan Note (Signed)
Sugars as outlined.  Have been under reasonable control.  Last a1c 11/01/14 - 7.2.  Follow.

## 2014-11-07 NOTE — Assessment & Plan Note (Signed)
Has tried lipitor and did not tolerate.  Pravastatin did not get the cholesterol to goal.  Start crestor 5mg  q day.  Check liver panel in 6 weeks.  Follow lipid panel and liver function tests.

## 2014-11-07 NOTE — Assessment & Plan Note (Signed)
Physical 02/26/14.  Colonoscopy 02/18/2005.  Mammogram 05/12/14 - Birads I.

## 2014-11-07 NOTE — Assessment & Plan Note (Signed)
Blood pressure as outlined.  Overall doing well.  Follow met b and pressures.

## 2014-11-10 ENCOUNTER — Other Ambulatory Visit: Payer: Self-pay | Admitting: Internal Medicine

## 2014-11-16 ENCOUNTER — Other Ambulatory Visit: Payer: Self-pay | Admitting: Internal Medicine

## 2014-11-18 DIAGNOSIS — M810 Age-related osteoporosis without current pathological fracture: Secondary | ICD-10-CM | POA: Diagnosis not present

## 2014-12-11 NOTE — Op Note (Signed)
PATIENT NAME:  Debra Kirk, SCHUMAN MR#:  [id number] DATE OF BIRTH:  Jun 20, 1930  DATE OF PROCEDURE:  11/02/2013  PREOPERATIVE DIAGNOSIS: Cataract, left eye.   POSTOPERATIVE DIAGNOSIS: Cataract, left eye.   PROCEDURE PERFORMED: Extracapsular cataract extraction using phacoemulsification with placement of Alcon [id number], 20.5-diopter posterior chamber lens, serial number [id number].   SURGEON: Loura Back. Darlis Wragg, MD   ANESTHESIA: 4% lidocaine and 0.75% Marcaine in a 50-50 mixture with 10 units/mL of Hylenex added, given as a peribulbar.    ANESTHESIOLOGIST: Amy M. Rice, MD  COMPLICATIONS: None.   ESTIMATED BLOOD LOSS: Less than 1 mL.   DESCRIPTION OF PROCEDURE:  The patient was brought to the operating room and given a peribulbar block.  The patient was then prepped and draped in the usual fashion.  The vertical rectus muscles were imbricated using 5-0 silk sutures.  These sutures were then clamped to the sterile drapes as bridle sutures.  A limbal peritomy was performed extending two clock hours and hemostasis was obtained with cautery.  A partial thickness scleral groove was made at the surgical limbus and dissected anteriorly in a lamellar dissection using an Alcon crescent knife.  The anterior chamber was entered supero-temporally with a Superblade and through the lamellar dissection with a 2.6 mm keratome.  DisCoVisc was used to replace the aqueous and a continuous tear capsulorrhexis was carried out.  Hydrodissection and hydrodelineation were carried out with balanced salt and a 27 gauge canula.  The nucleus was rotated to confirm the effectiveness of the hydrodissection.  Phacoemulsification was carried out using a divide-and-conquer technique.  Total ultrasound time was 1 minute and 25 seconds with an average power of 23.6, CDE of 33.91.  Irrigation/aspiration was used to remove the residual cortex.  DisCoVisc was used to inflate the capsule and the internal incision was enlarged to 3 mm  with the crescent knife.  The intraocular lens was folded and inserted into the capsular bag using the Alcon AcrySert delivery system.  Irrigation/aspiration was used to remove the residual DisCoVisc.  Miostat was injected into the anterior chamber through the paracentesis track to inflate the anterior chamber and induce miosis.  Cefuroxime 0.1 mL was injected via the paracentesis track containing 1 mg of drug. The wound was checked for leaks and none were found. The conjunctiva was closed with cautery and the bridle sutures were removed.  Two drops of 0.3% Vigamox were placed on the eye.   An eye shield was placed on the eye.  The patient was discharged to the recovery room in good condition.   ____________________________ Loura Back Sanay Belmar, MD sad:jcm D: 11/02/2013 13:46:47 ET T: 11/02/2013 21:44:57 ET JOB#: [id number]  cc: Remo Lipps A. Halia Franey, MD, <Dictator> Martie Lee MD ELECTRONICALLY SIGNED 11/09/2013 13:55

## 2014-12-11 NOTE — Op Note (Signed)
PATIENT NAME:  Debra Kirk, Debra Kirk MR#:  [id number] DATE OF BIRTH:  1929/12/09  DATE OF PROCEDURE:  08/31/2013  PREOPERATIVE DIAGNOSIS: Retained cataract fragment, right eye.   PROCEDURE PERFORMED: Retained cataract fragment, right eye.  PROCEDURE: Removal of cataract fragment, right eye.   SURGEON: Loura Back. Kristianne Albin, M.D.   ANESTHESIA: Was 4% lidocaine and 0.75% Marcaine,  a 50:50 mixture with 10 units/mL of Hylenex added, given as a peribulbar.   ANESTHESIOLOGIST: Dr. Myra Gianotti.   COMPLICATIONS: None.   ESTIMATED BLOOD LOSS: Less than 1 mL.   DESCRIPTION OF PROCEDURE: The patient was brought to the operating room and placed supine. She was given IV sedation and a peribulbar block. She was then prepped and draped in the usual fashion. The vertical rectus muscles were imbricated using 5-0 silk sutures, bridle sutures.   A paracentesis was made superonasally through clear cornea. The wound was opened using a Barraquer. Irrigation-aspiration handpiece was placed into the eye and the fragment was grabbed with the tip. The Camillia Herter was introduced via the paracentesis track and the fragment was chopped up into the port of the eye handpiece. Balanced salt was then used to hydrate the wound. The wound was checked for leaks and none were found. Then 0.05 mL of a solution of self-preserve Vigamox diluted to a tenth of a milligram/mL was injected into the eye via the paracentesis track. The wound was again checked for leaks. None were found. The bridle sutures were removed. Three drops of Vigamox were placed on the surface of the eye and a shield was placed on the eye. The patient was discharged to the recovery area in good condition.    ____________________________ Loura Back Marne Meline, MD sad:np D: 08/31/2013 13:30:47 ET T: 08/31/2013 21:53:28 ET JOB#: [id number]  cc: Remo Lipps A. Lem Peary, MD, <Dictator> Martie Lee MD ELECTRONICALLY SIGNED 09/07/2013 13:27

## 2014-12-11 NOTE — Op Note (Signed)
PATIENT NAME:  Debra Kirk, Debra Kirk MR#:  [id number] DATE OF BIRTH:  [birth year]  DATE OF PROCEDURE:  08/24/2013  PREOPERATIVE DIAGNOSIS:  Cataract, right eye.   POSTOPERATIVE DIAGNOSIS:  Cataract, right eye.  PROCEDURE PERFORMED:  Extracapsular cataract extraction using phacoemulsification with placement of an Alcon SN60CWS 21.0-diopter posterior chamber lens, serial number [id number].    SURGEON:  Loura Back. Swati Granberry, MD  ASSISTANT:  None.  ANESTHESIA:  4% lidocaine and 0.75% Marcaine in a 50/50 mixture with 10 units/mL of Hylenex added given as a peribulbar.  ANESTHESIOLOGIST:  Dr. [place] Service.   COMPLICATIONS:  None.  ESTIMATED BLOOD LOSS:  Less than 1 ml.  DESCRIPTION OF PROCEDURE:  The patient was brought to the operating room and given a peribulbar block.  The patient was then prepped and draped in the usual fashion.  The vertical rectus muscles were imbricated using 5-0 silk sutures.  These sutures were then clamped to the sterile drapes as bridle sutures.  A limbal peritomy was performed extending two clock hours and hemostasis was obtained with cautery.  A partial thickness scleral groove was made at the surgical limbus and dissected anteriorly in a lamellar dissection using an Alcon crescent knife.  The anterior chamber was entered superonasally with a Superblade and through the lamellar dissection with a 2.6 mm keratome.  DisCoVisc was used to replace the aqueous and a continuous tear capsulorrhexis was carried out.  Hydrodissection and hydrodelineation were carried out with balanced salt and a 27 gauge canula.  The nucleus was rotated to confirm the effectiveness of the hydrodissection.  Phacoemulsification was carried out using a divide-and-conquer technique.  Total ultrasound time was 1 minute 19 seconds with an average power of 25.7%.  CDE of 34.38.  Irrigation/aspiration was used to remove the residual cortex.  DisCoVisc was used to inflate the capsule and the internal  incision was enlarged to 3 mm with the crescent knife.  The intraocular lens was folded and inserted into the capsular bag using the AcrySert delivery system.  Irrigation/aspiration was used to remove the residual DisCoVisc.  Miostat was injected into the anterior chamber through the paracentesis track to inflate the anterior chamber and induce miosis.  No cefuroxime was used on this case. The wound was checked for leaks and none were found. The conjunctiva was closed with cautery and the bridle sutures were removed.  Two drops of 0.3% Vigamox were placed on the eye.   An eye shield was placed on the eye.  The patient was discharged to the recovery room in good condition.   ____________________________ Loura Back Doyle Tegethoff, MD sad:dmm D: 08/24/2013 11:54:31 ET T: 08/24/2013 12:15:10 ET JOB#: [id number]  cc: Remo Lipps A. Nyiah Pianka, MD, <Dictator> Martie Lee MD ELECTRONICALLY SIGNED 08/31/2013 13:24

## 2014-12-13 DIAGNOSIS — M79675 Pain in left toe(s): Secondary | ICD-10-CM | POA: Diagnosis not present

## 2014-12-13 DIAGNOSIS — B351 Tinea unguium: Secondary | ICD-10-CM | POA: Diagnosis not present

## 2014-12-13 DIAGNOSIS — M79674 Pain in right toe(s): Secondary | ICD-10-CM | POA: Diagnosis not present

## 2014-12-15 ENCOUNTER — Other Ambulatory Visit (INDEPENDENT_AMBULATORY_CARE_PROVIDER_SITE_OTHER): Payer: Medicare Other

## 2014-12-15 DIAGNOSIS — E7800 Pure hypercholesterolemia, unspecified: Secondary | ICD-10-CM

## 2014-12-15 LAB — HEPATIC FUNCTION PANEL
ALK PHOS: 48 U/L (ref 39–117)
ALT: 9 U/L (ref 0–35)
AST: 29 U/L (ref 0–37)
Albumin: 3.8 g/dL (ref 3.5–5.2)
BILIRUBIN TOTAL: 0.5 mg/dL (ref 0.2–1.2)
Bilirubin, Direct: 0.1 mg/dL (ref 0.0–0.3)
Total Protein: 6.4 g/dL (ref 6.0–8.3)

## 2014-12-16 ENCOUNTER — Encounter: Payer: Self-pay | Admitting: *Deleted

## 2014-12-16 ENCOUNTER — Other Ambulatory Visit: Payer: Self-pay | Admitting: Internal Medicine

## 2014-12-16 DIAGNOSIS — I10 Essential (primary) hypertension: Secondary | ICD-10-CM

## 2014-12-16 DIAGNOSIS — E7800 Pure hypercholesterolemia, unspecified: Secondary | ICD-10-CM

## 2014-12-16 DIAGNOSIS — K3184 Gastroparesis: Secondary | ICD-10-CM

## 2014-12-16 DIAGNOSIS — E1140 Type 2 diabetes mellitus with diabetic neuropathy, unspecified: Secondary | ICD-10-CM

## 2014-12-16 NOTE — Progress Notes (Signed)
Order placed for labs.

## 2015-01-18 ENCOUNTER — Other Ambulatory Visit: Payer: Self-pay | Admitting: Internal Medicine

## 2015-02-23 ENCOUNTER — Other Ambulatory Visit: Payer: Self-pay | Admitting: Internal Medicine

## 2015-03-21 ENCOUNTER — Other Ambulatory Visit (INDEPENDENT_AMBULATORY_CARE_PROVIDER_SITE_OTHER): Payer: Medicare Other

## 2015-03-21 DIAGNOSIS — K3184 Gastroparesis: Secondary | ICD-10-CM

## 2015-03-21 DIAGNOSIS — E7800 Pure hypercholesterolemia, unspecified: Secondary | ICD-10-CM

## 2015-03-21 DIAGNOSIS — E1140 Type 2 diabetes mellitus with diabetic neuropathy, unspecified: Secondary | ICD-10-CM | POA: Diagnosis not present

## 2015-03-21 LAB — BASIC METABOLIC PANEL
BUN: 11 mg/dL (ref 6–23)
CALCIUM: 9.3 mg/dL (ref 8.4–10.5)
CO2: 26 mEq/L (ref 19–32)
Chloride: 97 mEq/L (ref 96–112)
Creatinine, Ser: 0.89 mg/dL (ref 0.40–1.20)
GFR: 64.32 mL/min (ref >60.00)
Glucose, Bld: 166 mg/dL — ABNORMAL HIGH (ref 70–99)
POTASSIUM: 3.5 meq/L (ref 3.5–5.1)
Sodium: 136 mEq/L (ref 135–145)

## 2015-03-21 LAB — CBC WITH DIFFERENTIAL/PLATELET
Basophils Absolute: 0 10*3/uL (ref 0.0–0.1)
Basophils Relative: 0.6 % (ref 0.0–3.0)
EOS PCT: 0.1 % (ref 0.0–5.0)
Eosinophils Absolute: 0 10*3/uL (ref 0.0–0.7)
HCT: 39.9 % (ref 36.0–46.0)
Hemoglobin: 13.6 g/dL (ref 12.0–15.0)
Lymphocytes Relative: 27.2 % (ref 12.0–46.0)
Lymphs Abs: 1.7 10*3/uL (ref 0.7–4.0)
MCHC: 34 g/dL (ref 30.0–36.0)
MCV: 86.1 fl (ref 78.0–100.0)
Monocytes Absolute: 0.6 10*3/uL (ref 0.1–1.0)
Monocytes Relative: 9.3 % (ref 3.0–12.0)
Neutro Abs: 4 10*3/uL (ref 1.4–7.7)
Neutrophils Relative %: 62.8 % (ref 43.0–77.0)
Platelets: 214 10*3/uL (ref 150.0–400.0)
RBC: 4.64 Mil/uL (ref 3.87–5.11)
RDW: 14.6 % (ref 11.5–15.5)
WBC: 6.3 10*3/uL (ref 4.0–10.5)

## 2015-03-21 LAB — HEPATIC FUNCTION PANEL
ALT: 10 U/L (ref 0–35)
AST: 15 U/L (ref 0–37)
Albumin: 4 g/dL (ref 3.5–5.2)
Alkaline Phosphatase: 39 U/L (ref 39–117)
Bilirubin, Direct: 0.2 mg/dL (ref 0.0–0.3)
Total Bilirubin: 0.7 mg/dL (ref 0.2–1.2)
Total Protein: 7.3 g/dL (ref 6.0–8.3)

## 2015-03-21 LAB — LIPID PANEL
Cholesterol: 165 mg/dL (ref 0–200)
HDL: 45.4 mg/dL (ref >39.00)
LDL Cholesterol: 92 mg/dL (ref 0–99)
NonHDL: 119.63
TRIGLYCERIDES: 140 mg/dL (ref 0.0–149.0)
Total CHOL/HDL Ratio: 4
VLDL: 28 mg/dL (ref 0.0–40.0)

## 2015-03-21 LAB — TSH: TSH: 1.07 u[IU]/mL (ref 0.35–4.50)

## 2015-03-21 LAB — MICROALBUMIN / CREATININE URINE RATIO
Creatinine,U: 239.9 mg/dL
MICROALB UR: 3.3 mg/dL — AB (ref 0.0–1.9)
Microalb Creat Ratio: 1.4 mg/g (ref 0.0–30.0)

## 2015-03-21 LAB — HEMOGLOBIN A1C: Hgb A1c MFr Bld: 7.1 % — ABNORMAL HIGH (ref 4.6–6.5)

## 2015-03-22 ENCOUNTER — Encounter: Payer: Self-pay | Admitting: Internal Medicine

## 2015-03-22 ENCOUNTER — Ambulatory Visit (INDEPENDENT_AMBULATORY_CARE_PROVIDER_SITE_OTHER): Payer: Medicare Other | Admitting: Internal Medicine

## 2015-03-22 VITALS — BP 128/80 | HR 59 | Temp 98.1°F | Ht 66.0 in | Wt 153.2 lb

## 2015-03-22 DIAGNOSIS — R0602 Shortness of breath: Secondary | ICD-10-CM

## 2015-03-22 DIAGNOSIS — K3184 Gastroparesis: Secondary | ICD-10-CM

## 2015-03-22 DIAGNOSIS — E7800 Pure hypercholesterolemia, unspecified: Secondary | ICD-10-CM

## 2015-03-22 DIAGNOSIS — I10 Essential (primary) hypertension: Secondary | ICD-10-CM | POA: Diagnosis not present

## 2015-03-22 DIAGNOSIS — Z1239 Encounter for other screening for malignant neoplasm of breast: Secondary | ICD-10-CM

## 2015-03-22 DIAGNOSIS — Z0000 Encounter for general adult medical examination without abnormal findings: Secondary | ICD-10-CM

## 2015-03-22 DIAGNOSIS — R0789 Other chest pain: Secondary | ICD-10-CM

## 2015-03-22 DIAGNOSIS — E1140 Type 2 diabetes mellitus with diabetic neuropathy, unspecified: Secondary | ICD-10-CM

## 2015-03-22 NOTE — Progress Notes (Signed)
Pre visit review using our clinic review tool, if applicable. No additional management support is needed unless otherwise documented below in the visit note. 

## 2015-03-22 NOTE — Progress Notes (Signed)
Patient ID: Debra Kirk, female   DOB: October 16, 1929, 81 y.o.   MRN: [id number]   Subjective:    Patient ID: Debra Kirk, female    DOB: 22-Dec-1929, 81 y.o.   MRN: [id number]  HPI  Patient here to follow up on her current medical issues as well as for her physical exam.  She reports that in 01/2015 she developed shaking chills.  Subsequently - a dull headache.  Chest felt heavy.  For the next week, just felt washed out.  Since then, she has noticed increased fatigue.  Feels sob at times.  No increased cough or congestion.  No fever.  No further chills.  Recently has noticed that she gives out easier. Increased sob with going up inclines or stairs.  Shortness of breath with exertion.  Eating and drinking well.  Recent labs including cbc, kidney function tests, thyroid test and liver function tests were wnl.  No nausea or vomiting.  No diarrhea.  No abdominal pain or cramping.     Past Medical History  Diagnosis Date  . Hypertension   . Diabetes mellitus   . Hypercholesterolemia   . Femoral neuropathy     left, elevated CRP, ESR, negative FANA and ANCA  . Carotid artery disease   . Compression fracture     s/p fosamax, prolia  . Fibrocystic breast disease     Outpatient Encounter Prescriptions as of 03/22/2015  Medication Sig  . amLODipine (NORVASC) 5 MG tablet TAKE 1 TABLET BY MOUTH DAILY  . aspirin 81 MG tablet Take 81 mg by mouth daily.  . Calcium Carbonate-Vitamin D (CALCIUM 600+D) 600-400 MG-UNIT per tablet Take 1 tablet by mouth 2 (two) times daily.  . CRESTOR 5 MG tablet TAKE 1 TABLET (5 MG TOTAL) BY MOUTH DAILY.  Marland Kitchen denosumab (PROLIA) 60 MG/ML SOLN injection Inject 60 mg into the skin every 6 (six) months. Administer in upper arm, thigh, or abdomen  . glimepiride (AMARYL) 2 MG tablet TAKE 1 TABLET BY MOUTH EVERY DAY WITH BREAKFAST  . glucose blood test strip Contour test strips-Check blood sugar daily  . hydrochlorothiazide (HYDRODIURIL) 25 MG tablet TAKE 1 TABLET BY MOUTH  EVERY DAY  . metoCLOPramide (REGLAN) 5 MG tablet Take 5 mg by mouth 2 (two) times daily.   . metoprolol (LOPRESSOR) 50 MG tablet TAKE 1 AND 1/2 TABLET BY MOUTH TWICE A DAY  . omeprazole (PRILOSEC) 20 MG capsule TAKE ONE CAPSULE BY MOUTH TWICE A DAY   No facility-administered encounter medications on file as of 03/22/2015.    Review of Systems  Constitutional: Positive for fatigue. Negative for unexpected weight change.       Previous chills.  No fever.   HENT: Negative for congestion and sinus pressure.   Eyes: Negative for pain and visual disturbance.  Respiratory: Positive for chest tightness (previous tightness as outlined. ) and shortness of breath (sob with exertion. ). Negative for cough.   Cardiovascular: Negative for palpitations and leg swelling.  Gastrointestinal: Negative for nausea, vomiting, abdominal pain and diarrhea.  Genitourinary: Negative for dysuria and difficulty urinating.  Musculoskeletal: Negative for back pain and joint swelling.  Skin: Negative for color change and rash.  Neurological: Negative for dizziness, light-headedness and headaches.  Hematological: Negative for adenopathy. Does not bruise/bleed easily.  Psychiatric/Behavioral: Negative for dysphoric mood and agitation.       Objective:    Physical Exam  Constitutional: She is oriented to person, place, and time. She appears well-developed and well-nourished.  HENT:  Nose: Nose normal.  Mouth/Throat: Oropharynx is clear and moist.  Eyes: Right eye exhibits no discharge. Left eye exhibits no discharge. No scleral icterus.  Neck: Neck supple. No thyromegaly present.  Cardiovascular: Normal rate and regular rhythm.   Pulmonary/Chest: Breath sounds normal. No accessory muscle usage. No tachypnea. No respiratory distress. She has no decreased breath sounds. She has no wheezes. She has no rhonchi. Right breast exhibits no inverted nipple, no mass, no nipple discharge and no tenderness (no axillary  adenopathy). Left breast exhibits no inverted nipple, no mass, no nipple discharge and no tenderness (no axilarry adenopathy).  Abdominal: Soft. Bowel sounds are normal. There is no tenderness.  Musculoskeletal: She exhibits no edema or tenderness.  Lymphadenopathy:    She has no cervical adenopathy.  Neurological: She is alert and oriented to person, place, and time.  Skin: Skin is warm. No rash noted.  Psychiatric: She has a normal mood and affect. Her behavior is normal.    BP 128/80 mmHg  Pulse 59  Temp(Src) 98.1 F (36.7 C) (Oral)  Ht 5' 6" (1.676 m)  Wt 153 lb 4 oz (69.514 kg)  BMI 24.75 kg/m2  SpO2 96% Wt Readings from Last 3 Encounters:  03/22/15 153 lb 4 oz (69.514 kg)  11/02/14 157 lb 8 oz (71.442 kg)  06/29/14 154 lb 8 oz (70.081 kg)     Lab Results  Component Value Date   WBC 6.3 03/21/2015   HGB 13.6 03/21/2015   HCT 39.9 03/21/2015   PLT 214.0 03/21/2015   GLUCOSE 166* 03/21/2015   CHOL 165 03/21/2015   TRIG 140.0 03/21/2015   HDL 45.40 03/21/2015   LDLCALC 92 03/21/2015   ALT 10 03/21/2015   AST 15 03/21/2015   NA 136 03/21/2015   K 3.5 03/21/2015   CL 97 03/21/2015   CREATININE 0.89 03/21/2015   BUN 11 03/21/2015   CO2 26 03/21/2015   TSH 1.07 03/21/2015   HGBA1C 7.1* 03/21/2015   MICROALBUR 3.3* 03/21/2015       Assessment & Plan:   Problem List Items Addressed This Visit    Diabetes mellitus    A1c just checked - 7.1.  Low carb diet and exercise.  Follow met b and a1c.        Gastroparesis    Previous EGD revealed findings c/w gastroparesis.  Doing well.  Continue f/u with GI.       Health care maintenance    Physical today 03/22/15.  Colonoscopy 02/2005.  Mammogram 05/12/14 - Birads I.  Order f/u mammogram.        Hypercholesterolemia    On crestor.  Recent lipid panel improved.  Continue low cholesterol diet and exercise.   Lab Results  Component Value Date   CHOL 165 03/21/2015   HDL 45.40 03/21/2015   LDLCALC 92 03/21/2015    TRIG 140.0 03/21/2015   CHOLHDL 4 03/21/2015        Hypertension    Blood pressure has been under good control.  Continue same medication regimen.  Follow pressures.  Follow metabolic panel.        SOB (shortness of breath) - Primary    Has noticed increased sob, DOE and decreased energy.  Had the episode previously of chest tightness.  EKG obtained and revealed SR/SB with ventricular rate of 55.  Some ST depression V3-V6, some TWI - percordium.  Given EKG and current symptoms, will refer to cardiology for further evaluation and treatment.  Pt comfortable with this plan.    Will also check cxr.        Relevant Orders   EKG 12-Lead (Completed)   Ambulatory referral to Cardiology   DG Chest 2 View    Other Visit Diagnoses    Screening breast examination        Relevant Orders    MM DIGITAL SCREENING BILATERAL    Chest tightness        Relevant Orders    EKG 12-Lead (Completed)    Ambulatory referral to Cardiology      I spent 25 minutes with the patient and more than 50% of the time was spent in consultation regarding the above.     SCOTT, CHARLENE, MD   

## 2015-03-23 ENCOUNTER — Ambulatory Visit
Admission: RE | Admit: 2015-03-23 | Discharge: 2015-03-23 | Disposition: A | Discharge status: Home / Self Care | Payer: Medicare Other | Source: Ambulatory Visit | Attending: Internal Medicine | Admitting: Internal Medicine

## 2015-03-23 ENCOUNTER — Encounter: Payer: Self-pay | Admitting: Internal Medicine

## 2015-03-23 DIAGNOSIS — E780 Pure hypercholesterolemia: Secondary | ICD-10-CM | POA: Diagnosis not present

## 2015-03-23 DIAGNOSIS — R0602 Shortness of breath: Secondary | ICD-10-CM | POA: Diagnosis not present

## 2015-03-23 DIAGNOSIS — Z87891 Personal history of nicotine dependence: Secondary | ICD-10-CM | POA: Insufficient documentation

## 2015-03-23 DIAGNOSIS — I25119 Atherosclerotic heart disease of native coronary artery with unspecified angina pectoris: Secondary | ICD-10-CM | POA: Diagnosis not present

## 2015-03-23 DIAGNOSIS — R079 Chest pain, unspecified: Secondary | ICD-10-CM | POA: Insufficient documentation

## 2015-03-23 DIAGNOSIS — J449 Chronic obstructive pulmonary disease, unspecified: Secondary | ICD-10-CM | POA: Insufficient documentation

## 2015-03-23 DIAGNOSIS — E119 Type 2 diabetes mellitus without complications: Secondary | ICD-10-CM | POA: Diagnosis not present

## 2015-03-23 DIAGNOSIS — I10 Essential (primary) hypertension: Secondary | ICD-10-CM | POA: Diagnosis not present

## 2015-03-23 NOTE — Assessment & Plan Note (Signed)
Physical today 03/22/15.  Colonoscopy 02/2005.  Mammogram 05/12/14 - Birads I.  Order f/u mammogram.

## 2015-03-23 NOTE — Assessment & Plan Note (Signed)
Blood pressure has been under good control.  Continue same medication regimen.  Follow pressures.  Follow metabolic panel.   

## 2015-03-23 NOTE — Assessment & Plan Note (Signed)
Previous EGD revealed findings c/w gastroparesis.  Doing well.  Continue f/u with GI.

## 2015-03-23 NOTE — Assessment & Plan Note (Signed)
Has noticed increased sob, DOE and decreased energy.  Had the episode previously of chest tightness.  EKG obtained and revealed SR/SB with ventricular rate of 55.  Some ST depression V3-V6, some TWI - percordium.  Given EKG and current symptoms, will refer to cardiology for further evaluation and treatment.  Pt comfortable with this plan.  Will also check cxr.

## 2015-03-23 NOTE — Assessment & Plan Note (Signed)
A1c just checked - 7.1.  Low carb diet and exercise.  Follow met b and a1c.

## 2015-03-23 NOTE — Assessment & Plan Note (Signed)
On crestor.  Recent lipid panel improved.  Continue low cholesterol diet and exercise.   Lab Results  Component Value Date   CHOL 165 03/21/2015   HDL 45.40 03/21/2015   LDLCALC 92 03/21/2015   TRIG 140.0 03/21/2015   CHOLHDL 4 03/21/2015

## 2015-04-06 DIAGNOSIS — R079 Chest pain, unspecified: Secondary | ICD-10-CM | POA: Diagnosis not present

## 2015-04-12 DIAGNOSIS — R0602 Shortness of breath: Secondary | ICD-10-CM | POA: Diagnosis not present

## 2015-04-12 DIAGNOSIS — I10 Essential (primary) hypertension: Secondary | ICD-10-CM | POA: Diagnosis not present

## 2015-04-12 DIAGNOSIS — I25119 Atherosclerotic heart disease of native coronary artery with unspecified angina pectoris: Secondary | ICD-10-CM | POA: Diagnosis not present

## 2015-04-12 DIAGNOSIS — R079 Chest pain, unspecified: Secondary | ICD-10-CM | POA: Diagnosis not present

## 2015-04-25 ENCOUNTER — Other Ambulatory Visit: Payer: Self-pay | Admitting: Internal Medicine

## 2015-04-26 ENCOUNTER — Encounter: Payer: Self-pay | Admitting: Internal Medicine

## 2015-04-26 ENCOUNTER — Ambulatory Visit (INDEPENDENT_AMBULATORY_CARE_PROVIDER_SITE_OTHER): Payer: Medicare Other | Admitting: Internal Medicine

## 2015-04-26 VITALS — BP 120/70 | HR 61 | Temp 98.1°F | Ht 66.0 in | Wt 153.5 lb

## 2015-04-26 DIAGNOSIS — R0602 Shortness of breath: Secondary | ICD-10-CM

## 2015-04-26 DIAGNOSIS — Z0000 Encounter for general adult medical examination without abnormal findings: Secondary | ICD-10-CM | POA: Diagnosis not present

## 2015-04-26 DIAGNOSIS — E1140 Type 2 diabetes mellitus with diabetic neuropathy, unspecified: Secondary | ICD-10-CM

## 2015-04-26 DIAGNOSIS — G629 Polyneuropathy, unspecified: Secondary | ICD-10-CM

## 2015-04-26 DIAGNOSIS — E7800 Pure hypercholesterolemia, unspecified: Secondary | ICD-10-CM

## 2015-04-26 DIAGNOSIS — K3184 Gastroparesis: Secondary | ICD-10-CM | POA: Diagnosis not present

## 2015-04-26 DIAGNOSIS — Z23 Encounter for immunization: Secondary | ICD-10-CM

## 2015-04-26 DIAGNOSIS — I10 Essential (primary) hypertension: Secondary | ICD-10-CM | POA: Diagnosis not present

## 2015-04-26 DIAGNOSIS — R002 Palpitations: Secondary | ICD-10-CM

## 2015-04-26 NOTE — Progress Notes (Signed)
Patient ID: Debra Kirk, female   DOB: 05-15-1930, 81 y.o.   MRN: [id number]   Subjective:    Patient ID: Debra Kirk, female    DOB: 07-May-1930, 81 y.o.   MRN: [id number]  HPI  Patient here for a scheduled follow up.  She recently had cardiac w/up.  Echo and lexiscan ok.  Pt feels better.  Breathing better.  No chest pain or tightness.  Does report noticing some increased palpitations - brief.  Occurs most days.  No known triggers.  No headache or dizziness.  No acid reflux.  Bowels stable.  States am sugars averaging 150.  No checks in pm.     Past Medical History  Diagnosis Date  . Hypertension   . Diabetes mellitus   . Hypercholesterolemia   . Femoral neuropathy     left, elevated CRP, ESR, negative FANA and ANCA  . Carotid artery disease   . Compression fracture     s/p fosamax, prolia  . Fibrocystic breast disease    Past Surgical History  Procedure Laterality Date  . Partial hysterectomy    . Inguinal hernia repair      left x 1, right x 2  . Appendectomy    . Ovarian cyst removal    . Cholecystectomy    . Hernia repair     Family History  Problem Relation Age of Onset  . Heart disease Father     myocardial infarction  . Heart disease Mother     myocardial infarction  . Diabetes Mother   . Congestive Heart Failure Mother   . Bone cancer Sister   . Diabetes Sister     x2  . Heart disease Sister     CABG  . Rheum arthritis Sister   . Breast cancer Neg Hx   . Colon cancer Neg Hx    Social History   Social History  . Marital Status: Widowed    Spouse Name: N/A  . Number of Children: 3  . Years of Education: N/A   Social History Main Topics  . Smoking status: Former Smoker    Quit date: 08/20/1958  . Smokeless tobacco: Never Used  . Alcohol Use: No  . Drug Use: No  . Sexual Activity: Not Asked   Other Topics Concern  . None   Social History Narrative    Outpatient Encounter Prescriptions as of 04/26/2015  Medication Sig  . amLODipine  (NORVASC) 5 MG tablet TAKE 1 TABLET BY MOUTH DAILY  . aspirin 81 MG tablet Take 81 mg by mouth daily.  . Calcium Carbonate-Vitamin D (CALCIUM 600+D) 600-400 MG-UNIT per tablet Take 1 tablet by mouth 2 (two) times daily.  . CRESTOR 5 MG tablet TAKE 1 TABLET (5 MG TOTAL) BY MOUTH DAILY.  Marland Kitchen denosumab (PROLIA) 60 MG/ML SOLN injection Inject 60 mg into the skin every 6 (six) months. Administer in upper arm, thigh, or abdomen  . glimepiride (AMARYL) 2 MG tablet TAKE 1 TABLET BY MOUTH EVERY DAY WITH BREAKFAST  . glucose blood test strip Contour test strips-Check blood sugar daily  . hydrochlorothiazide (HYDRODIURIL) 25 MG tablet TAKE 1 TABLET BY MOUTH EVERY DAY  . metoCLOPramide (REGLAN) 5 MG tablet Take 5 mg by mouth 2 (two) times daily.   . metoprolol (LOPRESSOR) 50 MG tablet TAKE 1 AND 1/2 TABLET BY MOUTH TWICE A DAY  . omeprazole (PRILOSEC) 20 MG capsule TAKE ONE CAPSULE BY MOUTH TWICE A DAY   No facility-administered encounter medications on file as  of 04/26/2015.    Review of Systems  Constitutional: Negative for appetite change and unexpected weight change.  HENT: Negative for congestion and sinus pressure.   Eyes: Negative for pain and discharge.  Respiratory: Negative for cough, chest tightness and shortness of breath.   Cardiovascular: Negative for chest pain, palpitations and leg swelling.  Gastrointestinal: Negative for nausea, vomiting, abdominal pain and diarrhea.  Genitourinary: Negative for dysuria and difficulty urinating.  Musculoskeletal: Negative for back pain and joint swelling.  Skin: Negative for color change and rash.  Neurological: Negative for dizziness, light-headedness and headaches.  Psychiatric/Behavioral: Negative for dysphoric mood and agitation.       Objective:     Blood pressure rechecked by me :  130/62  Physical Exam  Constitutional: She appears well-developed and well-nourished. No distress.  HENT:  Nose: Nose normal.  Mouth/Throat: Oropharynx is  clear and moist.  Eyes: Conjunctivae are normal. Right eye exhibits no discharge. Left eye exhibits no discharge.  Neck: Neck supple. No thyromegaly present.  Cardiovascular: Normal rate and regular rhythm.   Pulmonary/Chest: Breath sounds normal. No respiratory distress. She has no wheezes.  Abdominal: Soft. Bowel sounds are normal. There is no tenderness.  Musculoskeletal: She exhibits no edema or tenderness.  Lymphadenopathy:    She has no cervical adenopathy.  Skin: No rash noted. No erythema.  Psychiatric: She has a normal mood and affect. Her behavior is normal.    BP 120/70 mmHg  Pulse 61  Temp(Src) 98.1 F (36.7 C) (Oral)  Ht 5' 6"  (1.676 m)  Wt 153 lb 8 oz (69.627 kg)  BMI 24.79 kg/m2  SpO2 94% Wt Readings from Last 3 Encounters:  04/26/15 153 lb 8 oz (69.627 kg)  03/22/15 153 lb 4 oz (69.514 kg)  11/02/14 157 lb 8 oz (71.442 kg)     Lab Results  Component Value Date   WBC 6.3 03/21/2015   HGB 13.6 03/21/2015   HCT 39.9 03/21/2015   PLT 214.0 03/21/2015   GLUCOSE 166* 03/21/2015   CHOL 165 03/21/2015   TRIG 140.0 03/21/2015   HDL 45.40 03/21/2015   LDLCALC 92 03/21/2015   ALT 10 03/21/2015   AST 15 03/21/2015   NA 136 03/21/2015   K 3.5 03/21/2015   CL 97 03/21/2015   CREATININE 0.89 03/21/2015   BUN 11 03/21/2015   CO2 26 03/21/2015   TSH 1.07 03/21/2015   HGBA1C 7.1* 03/21/2015   MICROALBUR 3.3* 03/21/2015    Dg Chest 2 View  03/23/2015   CLINICAL DATA:  One month history of shortness of breath and chest heaviness, no history of cardiopulmonary abnormality. , history of diabetes, hypertension, hypercholesterolemia, and previous tobacco use.  EXAM: CHEST  2 VIEW  COMPARISON:  CT scan of the chest of June 04, 2011 and report of a chest x-ray of November 16, 2010 ; a chest x-ray dated June 14, 2009 is reviewed as well.  FINDINGS: The lungs are hyperinflated with hemidiaphragm flattening. There is no focal infiltrate. There is no pleural effusion or  pneumothorax. There is a stable subcentimeter calcified nodule projecting just above the right lateral costophrenic angle. The heart and pulmonary vascularity are normal. The mediastinum is normal in width. The bony thorax exhibits no acute abnormality. There is chronic wedge compression of the body of T1.  IMPRESSION: 1. COPD. There is no pneumonia, CHF, nor other acute cardiopulmonary abnormality. 2. There is evidence of previous granulomatous infection. 3. Old compression of the body of L1.   Electronically Signed  By: David  Martinique M.D.   On: 03/23/2015 16:30       Assessment & Plan:   Problem List Items Addressed This Visit    Diabetes mellitus    Sugars as outlined.  Low carb diet and exercise.  Follow met b and a1c.       Relevant Orders   Hemoglobin V6P   Basic metabolic panel   Gastroparesis    Previous EGD revealed findings c/w gastroparesis.  Doing well.  Followed by GI.        Health care maintenance - Primary   Hypercholesterolemia    Low cholesterol diet and exercise.  Follow lipid panel and liver function tests.  On crestor.        Relevant Orders   Lipid panel   Hepatic function panel   Hypertension    Blood pressure under good control.  Continue same medication regimen.  Follow pressures.  Follow metabolic panel.        Palpitations    Symptoms as outlined.  Does feel better.  Discussed with her regarding further w/up.  Discussed holter monitor.  She declines.  Wants to hold on further w/up at this point.  Follow.        Peripheral neuropathy    Stable.  Follow.       SOB (shortness of breath)    Recent cardiac w/up negative.  States breathing is better.  Follow.         Other Visit Diagnoses    Need for vaccination with 13-polyvalent pneumococcal conjugate vaccine        Relevant Orders    Pneumococcal conjugate vaccine 13-valent (Completed)        Einar Pheasant, MD

## 2015-04-26 NOTE — Progress Notes (Signed)
Pre visit review using our clinic review tool, if applicable. No additional management support is needed unless otherwise documented below in the visit note. 

## 2015-05-01 ENCOUNTER — Encounter: Payer: Self-pay | Admitting: Internal Medicine

## 2015-05-01 DIAGNOSIS — R002 Palpitations: Secondary | ICD-10-CM | POA: Insufficient documentation

## 2015-05-01 NOTE — Assessment & Plan Note (Signed)
Symptoms as outlined.  Does feel better.  Discussed with her regarding further w/up.  Discussed holter monitor.  She declines.  Wants to hold on further w/up at this point.  Follow.

## 2015-05-01 NOTE — Assessment & Plan Note (Signed)
Low cholesterol diet and exercise.  Follow lipid panel and liver function tests.  On crestor.   

## 2015-05-01 NOTE — Assessment & Plan Note (Signed)
Blood pressure under good control.  Continue same medication regimen.  Follow pressures.  Follow metabolic panel.   

## 2015-05-01 NOTE — Assessment & Plan Note (Signed)
Recent cardiac w/up negative.  States breathing is better.  Follow.

## 2015-05-01 NOTE — Assessment & Plan Note (Signed)
Stable.  Follow.   

## 2015-05-01 NOTE — Assessment & Plan Note (Signed)
Previous EGD revealed findings c/w gastroparesis.  Doing well.  Followed by GI.

## 2015-05-01 NOTE — Assessment & Plan Note (Signed)
Sugars as outlined.  Low carb diet and exercise.  Follow met b and a1c.

## 2015-05-10 ENCOUNTER — Other Ambulatory Visit: Payer: Self-pay | Admitting: Internal Medicine

## 2015-05-11 DIAGNOSIS — M810 Age-related osteoporosis without current pathological fracture: Secondary | ICD-10-CM | POA: Diagnosis not present

## 2015-05-18 ENCOUNTER — Other Ambulatory Visit: Payer: Self-pay | Admitting: Internal Medicine

## 2015-05-18 ENCOUNTER — Ambulatory Visit
Admission: RE | Admit: 2015-05-18 | Discharge: 2015-05-18 | Disposition: A | Discharge status: Home / Self Care | Payer: Medicare Other | Source: Ambulatory Visit | Attending: Internal Medicine | Admitting: Internal Medicine

## 2015-05-18 DIAGNOSIS — Z1231 Encounter for screening mammogram for malignant neoplasm of breast: Secondary | ICD-10-CM | POA: Diagnosis present

## 2015-05-18 DIAGNOSIS — Z1239 Encounter for other screening for malignant neoplasm of breast: Secondary | ICD-10-CM

## 2015-05-25 DIAGNOSIS — M810 Age-related osteoporosis without current pathological fracture: Secondary | ICD-10-CM | POA: Diagnosis not present

## 2015-06-16 DIAGNOSIS — Z23 Encounter for immunization: Secondary | ICD-10-CM | POA: Diagnosis not present

## 2015-07-05 DIAGNOSIS — R040 Epistaxis: Secondary | ICD-10-CM | POA: Diagnosis not present

## 2015-07-18 ENCOUNTER — Other Ambulatory Visit: Payer: Self-pay | Admitting: Internal Medicine

## 2015-08-01 ENCOUNTER — Other Ambulatory Visit (INDEPENDENT_AMBULATORY_CARE_PROVIDER_SITE_OTHER): Payer: Medicare Other

## 2015-08-01 DIAGNOSIS — E1140 Type 2 diabetes mellitus with diabetic neuropathy, unspecified: Secondary | ICD-10-CM | POA: Diagnosis not present

## 2015-08-01 DIAGNOSIS — E7800 Pure hypercholesterolemia, unspecified: Secondary | ICD-10-CM

## 2015-08-01 LAB — HEPATIC FUNCTION PANEL
ALT: 12 U/L (ref 0–35)
AST: 15 U/L (ref 0–37)
Albumin: 3.9 g/dL (ref 3.5–5.2)
Alkaline Phosphatase: 40 U/L (ref 39–117)
BILIRUBIN DIRECT: 0.1 mg/dL (ref 0.0–0.3)
TOTAL PROTEIN: 7 g/dL (ref 6.0–8.3)
Total Bilirubin: 0.5 mg/dL (ref 0.2–1.2)

## 2015-08-01 LAB — BASIC METABOLIC PANEL
BUN: 19 mg/dL (ref 6–23)
CALCIUM: 9.3 mg/dL (ref 8.4–10.5)
CO2: 31 meq/L (ref 19–32)
CREATININE: 0.89 mg/dL (ref 0.40–1.20)
Chloride: 98 mEq/L (ref 96–112)
GFR: 64.26 mL/min (ref >60.00)
GLUCOSE: 197 mg/dL — AB (ref 70–99)
Potassium: 3.7 mEq/L (ref 3.5–5.1)
Sodium: 137 mEq/L (ref 135–145)

## 2015-08-01 LAB — LIPID PANEL
Cholesterol: 156 mg/dL (ref 0–200)
HDL: 43.4 mg/dL (ref >39.00)
LDL CALC: 91 mg/dL (ref 0–99)
NonHDL: 112.17
TRIGLYCERIDES: 104 mg/dL (ref 0.0–149.0)
Total CHOL/HDL Ratio: 4
VLDL: 20.8 mg/dL (ref 0.0–40.0)

## 2015-08-01 LAB — HEMOGLOBIN A1C: Hgb A1c MFr Bld: 7.4 % — ABNORMAL HIGH (ref 4.6–6.5)

## 2015-08-02 ENCOUNTER — Encounter: Payer: Self-pay | Admitting: Internal Medicine

## 2015-08-02 ENCOUNTER — Ambulatory Visit (INDEPENDENT_AMBULATORY_CARE_PROVIDER_SITE_OTHER): Payer: Medicare Other | Admitting: Internal Medicine

## 2015-08-02 VITALS — BP 132/70 | HR 65 | Temp 97.7°F | Resp 18 | Ht 66.0 in | Wt 156.2 lb

## 2015-08-02 DIAGNOSIS — E1140 Type 2 diabetes mellitus with diabetic neuropathy, unspecified: Secondary | ICD-10-CM

## 2015-08-02 DIAGNOSIS — I10 Essential (primary) hypertension: Secondary | ICD-10-CM | POA: Diagnosis not present

## 2015-08-02 DIAGNOSIS — E7800 Pure hypercholesterolemia, unspecified: Secondary | ICD-10-CM

## 2015-08-02 DIAGNOSIS — G6289 Other specified polyneuropathies: Secondary | ICD-10-CM | POA: Diagnosis not present

## 2015-08-02 DIAGNOSIS — K3184 Gastroparesis: Secondary | ICD-10-CM | POA: Diagnosis not present

## 2015-08-02 DIAGNOSIS — R040 Epistaxis: Secondary | ICD-10-CM

## 2015-08-02 NOTE — Progress Notes (Signed)
Patient ID: Debra Kirk, female   DOB: 1930/04/29, 81 y.o.   MRN: [id number]   Subjective:    Patient ID: Debra Kirk, female    DOB: Mar 09, 1930, 81 y.o.   MRN: [id number]  HPI  Patient with past history of hypercholesterolemia, diabetes and hypertension.  She comes in today to follow up on these issues.  She has been having nosebleeds.  Saw ENT.  S/p cauterization.  Resolved for a while.  Restarted.  Has f/u with ENT tomorrow.  States otherwise doing well.  No cardiac symptoms with increased activity or exertion.  No sob.  No acid reflux.  No abdominal pain or cramping. Bowels stable.  Discussed diet and exercise.  a1c just checked and increased some from the previous check - 7.4.  Staying hydrated.     Past Medical History  Diagnosis Date  . Hypertension   . Diabetes mellitus ([place])   . Hypercholesterolemia   . Femoral neuropathy     left, elevated CRP, ESR, negative FANA and ANCA  . Carotid artery disease ([place])   . Compression fracture     s/p fosamax, prolia  . Fibrocystic breast disease    Past Surgical History  Procedure Laterality Date  . Partial hysterectomy    . Inguinal hernia repair      left x 1, right x 2  . Appendectomy    . Ovarian cyst removal    . Cholecystectomy    . Hernia repair     Family History  Problem Relation Age of Onset  . Heart disease Father     myocardial infarction  . Heart disease Mother     myocardial infarction  . Diabetes Mother   . Congestive Heart Failure Mother   . Bone cancer Sister   . Diabetes Sister     x2  . Heart disease Sister     CABG  . Rheum arthritis Sister   . Breast cancer Neg Hx   . Colon cancer Neg Hx    Social History   Social History  . Marital Status: Widowed    Spouse Name: N/A  . Number of Children: 3  . Years of Education: N/A   Social History Main Topics  . Smoking status: Former Smoker    Quit date: 08/20/1958  . Smokeless tobacco: Never Used  . Alcohol Use: No  . Drug Use: No  .  Sexual Activity: Not Asked   Other Topics Concern  . None   Social History Narrative    Outpatient Encounter Prescriptions as of 08/02/2015  Medication Sig  . amLODipine (NORVASC) 5 MG tablet TAKE 1 TABLET BY MOUTH DAILY  . aspirin 81 MG tablet Take 81 mg by mouth daily.  . Calcium Carbonate-Vitamin D (CALCIUM 600+D) 600-400 MG-UNIT per tablet Take 1 tablet by mouth 2 (two) times daily.  . CRESTOR 5 MG tablet TAKE 1 TABLET (5 MG TOTAL) BY MOUTH DAILY.  Marland Kitchen denosumab (PROLIA) 60 MG/ML SOLN injection Inject 60 mg into the skin every 6 (six) months. Administer in upper arm, thigh, or abdomen  . glimepiride (AMARYL) 2 MG tablet TAKE 1 TABLET BY MOUTH EVERY DAY WITH BREAKFAST  . glucose blood test strip Contour test strips-Check blood sugar daily  . hydrochlorothiazide (HYDRODIURIL) 25 MG tablet TAKE 1 TABLET BY MOUTH EVERY DAY  . metoCLOPramide (REGLAN) 5 MG tablet Take 5 mg by mouth 2 (two) times daily.   . metoprolol (LOPRESSOR) 50 MG tablet TAKE 1 AND 1/2 TABLET BY MOUTH TWICE  A DAY  . omeprazole (PRILOSEC) 20 MG capsule TAKE ONE CAPSULE BY MOUTH TWICE A DAY   No facility-administered encounter medications on file as of 08/02/2015.     Review of Systems  Constitutional: Negative for appetite change and unexpected weight change.  HENT: Negative for congestion and sinus pressure.        Persistent nose bleeds as outlined.    Eyes: Negative for pain and discharge.  Respiratory: Negative for cough, chest tightness and shortness of breath.   Cardiovascular: Negative for chest pain, palpitations and leg swelling.  Gastrointestinal: Negative for nausea, vomiting, abdominal pain and diarrhea.  Genitourinary: Negative for dysuria and difficulty urinating.  Musculoskeletal: Negative for myalgias and joint swelling.  Skin: Negative for color change and rash.  Neurological: Negative for dizziness, light-headedness and headaches.  Psychiatric/Behavioral: Negative for dysphoric mood and  agitation.       Objective:     Blood pressure rechecked by me:  130/78  Physical Exam  Constitutional: She appears well-developed and well-nourished. No distress.  HENT:  Head: Normocephalic and atraumatic.  Mouth/Throat: Oropharynx is clear and moist.  Neck: Neck supple. No thyromegaly present.  Cardiovascular: Normal rate and regular rhythm.   Pulmonary/Chest: Breath sounds normal. No respiratory distress. She has no wheezes.  Abdominal: Soft. Bowel sounds are normal. There is no tenderness.  Musculoskeletal: She exhibits no edema or tenderness.  Lymphadenopathy:    She has no cervical adenopathy.  Skin: No rash noted. No erythema.  Psychiatric: She has a normal mood and affect. Her behavior is normal.    BP 132/70 mmHg  Pulse 65  Temp(Src) 97.7 F (36.5 C) (Oral)  Resp 18  Ht 5' 6"  (1.676 m)  Wt 156 lb 4 oz (70.875 kg)  BMI 25.23 kg/m2  SpO2 97% Wt Readings from Last 3 Encounters:  08/02/15 156 lb 4 oz (70.875 kg)  04/26/15 153 lb 8 oz (69.627 kg)  03/22/15 153 lb 4 oz (69.514 kg)     Lab Results  Component Value Date   WBC 6.3 03/21/2015   HGB 13.6 03/21/2015   HCT 39.9 03/21/2015   PLT 214.0 03/21/2015   GLUCOSE 197* 08/01/2015   CHOL 156 08/01/2015   TRIG 104.0 08/01/2015   HDL 43.40 08/01/2015   LDLCALC 91 08/01/2015   ALT 12 08/01/2015   AST 15 08/01/2015   NA 137 08/01/2015   K 3.7 08/01/2015   CL 98 08/01/2015   CREATININE 0.89 08/01/2015   BUN 19 08/01/2015   CO2 31 08/01/2015   TSH 1.07 03/21/2015   HGBA1C 7.4* 08/01/2015   MICROALBUR 3.3* 03/21/2015    Mm Digital Screening Bilateral  05/18/2015  CLINICAL DATA:  Screening. EXAM: DIGITAL SCREENING BILATERAL MAMMOGRAM WITH CAD COMPARISON:  Previous exam(s). ACR Breast Density Category b: There are scattered areas of fibroglandular density. FINDINGS: There are no findings suspicious for malignancy. Images were processed with CAD. IMPRESSION: No mammographic evidence of malignancy. A result  letter of this screening mammogram will be mailed directly to the patient. RECOMMENDATION: Screening mammogram in one year. (Code:SM-B-01Y) BI-RADS CATEGORY  1: Negative. Electronically Signed   By: Franki Cabot M.D.   On: 05/18/2015 11:42       Assessment & Plan:   Problem List Items Addressed This Visit    Diabetes mellitus ([place])    a1c slightly increased from previous check.  Discussed low carb diet and exercise.  Follow met b and a1c.  Hold on making changes in her medication.  Relevant Orders   Hemoglobin A1c   Epistaxis, recurrent    Has seen ENT.  S/p cauterization.  Due to f/u tomorrow given persistent intermittent bleeding.       Gastroparesis    Followed by GI.  On reglan.  They are prescribing.        Hypercholesterolemia    Low cholesterol diet and exercise.  On crestor.  Follow lipid panel and liver function tests.   Lab Results  Component Value Date   CHOL 156 08/01/2015   HDL 43.40 08/01/2015   LDLCALC 91 08/01/2015   TRIG 104.0 08/01/2015   CHOLHDL 4 08/01/2015        Relevant Orders   Hepatic function panel   Lipid panel   Hypertension - Primary    Blood pressure under good control.  Continue same medication regimen.  Follow pressures.  Follow metabolic panel.        Relevant Orders   Basic metabolic panel   Peripheral neuropathy ([facility])    Stable.  Follow.           Einar Pheasant, MD

## 2015-08-02 NOTE — Progress Notes (Signed)
Pre-visit discussion using our clinic review tool. No additional management support is needed unless otherwise documented below in the visit note.  

## 2015-08-03 DIAGNOSIS — R040 Epistaxis: Secondary | ICD-10-CM | POA: Diagnosis not present

## 2015-08-08 ENCOUNTER — Encounter: Payer: Self-pay | Admitting: Internal Medicine

## 2015-08-08 DIAGNOSIS — R040 Epistaxis: Secondary | ICD-10-CM | POA: Insufficient documentation

## 2015-08-08 NOTE — Assessment & Plan Note (Signed)
Blood pressure under good control.  Continue same medication regimen.  Follow pressures.  Follow metabolic panel.   

## 2015-08-08 NOTE — Assessment & Plan Note (Signed)
Has seen ENT.  S/p cauterization.  Due to f/u tomorrow given persistent intermittent bleeding.

## 2015-08-08 NOTE — Assessment & Plan Note (Signed)
Stable.  Follow.   

## 2015-08-08 NOTE — Assessment & Plan Note (Signed)
Followed by GI.  On reglan.  They are prescribing.

## 2015-08-08 NOTE — Assessment & Plan Note (Signed)
Low cholesterol diet and exercise.  On crestor.  Follow lipid panel and liver function tests.   Lab Results  Component Value Date   CHOL 156 08/01/2015   HDL 43.40 08/01/2015   LDLCALC 91 08/01/2015   TRIG 104.0 08/01/2015   CHOLHDL 4 08/01/2015

## 2015-08-08 NOTE — Assessment & Plan Note (Signed)
a1c slightly increased from previous check.  Discussed low carb diet and exercise.  Follow met b and a1c.  Hold on making changes in her medication.

## 2015-08-16 ENCOUNTER — Other Ambulatory Visit: Payer: Self-pay | Admitting: Internal Medicine

## 2015-08-16 DIAGNOSIS — R079 Chest pain, unspecified: Secondary | ICD-10-CM | POA: Diagnosis not present

## 2015-08-16 DIAGNOSIS — E7800 Pure hypercholesterolemia, unspecified: Secondary | ICD-10-CM | POA: Diagnosis not present

## 2015-08-16 DIAGNOSIS — R0602 Shortness of breath: Secondary | ICD-10-CM | POA: Diagnosis not present

## 2015-08-16 DIAGNOSIS — I25119 Atherosclerotic heart disease of native coronary artery with unspecified angina pectoris: Secondary | ICD-10-CM | POA: Diagnosis not present

## 2015-08-16 DIAGNOSIS — I10 Essential (primary) hypertension: Secondary | ICD-10-CM | POA: Diagnosis not present

## 2015-08-16 DIAGNOSIS — E119 Type 2 diabetes mellitus without complications: Secondary | ICD-10-CM | POA: Diagnosis not present

## 2015-08-18 ENCOUNTER — Other Ambulatory Visit: Payer: Self-pay

## 2015-08-18 MED ORDER — GLUCOSE BLOOD VI STRP: 1.0000 | ORAL_STRIP | Status: DC | PRN

## 2015-08-22 ENCOUNTER — Other Ambulatory Visit: Payer: Self-pay | Admitting: Internal Medicine

## 2015-09-05 ENCOUNTER — Ambulatory Visit (INDEPENDENT_AMBULATORY_CARE_PROVIDER_SITE_OTHER): Payer: Medicare Other

## 2015-09-05 VITALS — BP 122/62 | HR 60 | Temp 97.4°F | Resp 14 | Ht 66.0 in | Wt 157.0 lb

## 2015-09-05 DIAGNOSIS — Z0000 Encounter for general adult medical examination without abnormal findings: Secondary | ICD-10-CM | POA: Diagnosis not present

## 2015-09-05 NOTE — Patient Instructions (Addendum)
Debra Kirk,  Thank you for taking time to come for your Medicare Wellness Visit.  I appreciate your ongoing commitment to your health goals. Please review the following plan we discussed and let me know if I can assist you in the future.   Health Maintenance, Female Adopting a healthy lifestyle and getting preventive care can go a long way to promote health and wellness. Talk with your health care provider about what schedule of regular examinations is right for you. This is a good chance for you to check in with your provider about disease prevention and staying healthy. In between checkups, there are plenty of things you can do on your own. Experts have done a lot of research about which lifestyle changes and preventive measures are most likely to keep you healthy. Ask your health care provider for more information. WEIGHT AND DIET  Eat a healthy diet  Be sure to include plenty of vegetables, fruits, low-fat dairy products, and lean protein.  Do not eat a lot of foods high in solid fats, added sugars, or salt.  Get regular exercise. This is one of the most important things you can do for your health.  Most adults should exercise for at least 150 minutes each week. The exercise should increase your heart rate and make you sweat (moderate-intensity exercise).  Most adults should also do strengthening exercises at least twice a week. This is in addition to the moderate-intensity exercise.  Maintain a healthy weight  Body mass index (BMI) is a measurement that can be used to identify possible weight problems. It estimates body fat based on height and weight. Your health care provider can help determine your BMI and help you achieve or maintain a healthy weight.  For females 67 years of age and older:   A BMI below 18.5 is considered underweight.  A BMI of 18.5 to 24.9 is normal.  A BMI of 25 to 29.9 is considered overweight.  A BMI of 30 and above is considered obese.  Watch levels of  cholesterol and blood lipids  You should start having your blood tested for lipids and cholesterol at 80 years of age, then have this test every 5 years.  You may need to have your cholesterol levels checked more often if:  Your lipid or cholesterol levels are high.  You are older than 80 years of age.  You are at high risk for heart disease.  CANCER SCREENING   Lung Cancer  Lung cancer screening is recommended for adults 59-60 years old who are at high risk for lung cancer because of a history of smoking.  A yearly low-dose CT scan of the lungs is recommended for people who:  Currently smoke.  Have quit within the past 15 years.  Have at least a 30-pack-year history of smoking. A pack year is smoking an average of one pack of cigarettes a day for 1 year.  Yearly screening should continue until it has been 15 years since you quit.  Yearly screening should stop if you develop a health problem that would prevent you from having lung cancer treatment.  Breast Cancer  Practice breast self-awareness. This means understanding how your breasts normally appear and feel.  It also means doing regular breast self-exams. Let your health care provider know about any changes, no matter how small.  If you are in your 20s or 30s, you should have a clinical breast exam (CBE) by a health care provider every 1-3 years as part of a  regular health exam.  If you are 40 or older, have a CBE every year. Also consider having a breast X-ray (mammogram) every year.  If you have a family history of breast cancer, talk to your health care provider about genetic screening.  If you are at high risk for breast cancer, talk to your health care provider about having an MRI and a mammogram every year.  Breast cancer gene (BRCA) assessment is recommended for women who have family members with BRCA-related cancers. BRCA-related cancers include:  Breast.  Ovarian.  Tubal.  Peritoneal  cancers.  Results of the assessment will determine the need for genetic counseling and BRCA1 and BRCA2 testing. Cervical Cancer Your health care provider may recommend that you be screened regularly for cancer of the pelvic organs (ovaries, uterus, and vagina). This screening involves a pelvic examination, including checking for microscopic changes to the surface of your cervix (Pap test). You may be encouraged to have this screening done every 3 years, beginning at age 59.  For women ages 7-65, health care providers may recommend pelvic exams and Pap testing every 3 years, or they may recommend the Pap and pelvic exam, combined with testing for human papilloma virus (HPV), every 5 years. Some types of HPV increase your risk of cervical cancer. Testing for HPV may also be done on women of any age with unclear Pap test results.  Other health care providers may not recommend any screening for nonpregnant women who are considered low risk for pelvic cancer and who do not have symptoms. Ask your health care provider if a screening pelvic exam is right for you.  If you have had past treatment for cervical cancer or a condition that could lead to cancer, you need Pap tests and screening for cancer for at least 20 years after your treatment. If Pap tests have been discontinued, your risk factors (such as having a new sexual partner) need to be reassessed to determine if screening should resume. Some women have medical problems that increase the chance of getting cervical cancer. In these cases, your health care provider may recommend more frequent screening and Pap tests. Colorectal Cancer  This type of cancer can be detected and often prevented.  Routine colorectal cancer screening usually begins at 80 years of age and continues through 80 years of age.  Your health care provider may recommend screening at an earlier age if you have risk factors for colon cancer.  Your health care provider may also  recommend using home test kits to check for hidden blood in the stool.  A small camera at the end of a tube can be used to examine your colon directly (sigmoidoscopy or colonoscopy). This is done to check for the earliest forms of colorectal cancer.  Routine screening usually begins at age 2.  Direct examination of the colon should be repeated every 5-10 years through 80 years of age. However, you may need to be screened more often if early forms of precancerous polyps or small growths are found. Skin Cancer  Check your skin from head to toe regularly.  Tell your health care provider about any new moles or changes in moles, especially if there is a change in a mole's shape or color.  Also tell your health care provider if you have a mole that is larger than the size of a pencil eraser.  Always use sunscreen. Apply sunscreen liberally and repeatedly throughout the day.  Protect yourself by wearing long sleeves, pants, a wide-brimmed hat,  and sunglasses whenever you are outside. HEART DISEASE, DIABETES, AND HIGH BLOOD PRESSURE   High blood pressure causes heart disease and increases the risk of stroke. High blood pressure is more likely to develop in:  People who have blood pressure in the high end of the normal range (130-139/85-89 mm Hg).  People who are overweight or obese.  People who are African American.  If you are 65-32 years of age, have your blood pressure checked every 3-5 years. If you are 50 years of age or older, have your blood pressure checked every year. You should have your blood pressure measured twice--once when you are at a hospital or clinic, and once when you are not at a hospital or clinic. Record the average of the two measurements. To check your blood pressure when you are not at a hospital or clinic, you can use:  An automated blood pressure machine at a pharmacy.  A home blood pressure monitor.  If you are between 81 years and 38 years old, ask your health  care provider if you should take aspirin to prevent strokes.  Have regular diabetes screenings. This involves taking a blood sample to check your fasting blood sugar level.  If you are at a normal weight and have a low risk for diabetes, have this test once every three years after 80 years of age.  If you are overweight and have a high risk for diabetes, consider being tested at a younger age or more often. PREVENTING INFECTION  Hepatitis B  If you have a higher risk for hepatitis B, you should be screened for this virus. You are considered at high risk for hepatitis B if:  You were born in a country where hepatitis B is common. Ask your health care provider which countries are considered high risk.  Your parents were born in a high-risk country, and you have not been immunized against hepatitis B (hepatitis B vaccine).  You have HIV or AIDS.  You use needles to inject street drugs.  You live with someone who has hepatitis B.  You have had sex with someone who has hepatitis B.  You get hemodialysis treatment.  You take certain medicines for conditions, including cancer, organ transplantation, and autoimmune conditions. Hepatitis C  Blood testing is recommended for:  Everyone born from [birth year] through 1963.  Anyone with known risk factors for hepatitis C. Sexually transmitted infections (STIs)  You should be screened for sexually transmitted infections (STIs) including gonorrhea and chlamydia if:  You are sexually active and are younger than 80 years of age.  You are older than 80 years of age and your health care provider tells you that you are at risk for this type of infection.  Your sexual activity has changed since you were last screened and you are at an increased risk for chlamydia or gonorrhea. Ask your health care provider if you are at risk.  If you do not have HIV, but are at risk, it may be recommended that you take a prescription medicine daily to prevent HIV  infection. This is called pre-exposure prophylaxis (PrEP). You are considered at risk if:  You are sexually active and do not regularly use condoms or know the HIV status of your partner(s).  You take drugs by injection.  You are sexually active with a partner who has HIV. Talk with your health care provider about whether you are at high risk of being infected with HIV. If you choose to begin PrEP, you should  first be tested for HIV. You should then be tested every 3 months for as long as you are taking PrEP.  PREGNANCY   If you are premenopausal and you may become pregnant, ask your health care provider about preconception counseling.  If you may become pregnant, take 400 to 800 micrograms (mcg) of folic acid every day.  If you want to prevent pregnancy, talk to your health care provider about birth control (contraception). OSTEOPOROSIS AND MENOPAUSE   Osteoporosis is a disease in which the bones lose minerals and strength with aging. This can result in serious bone fractures. Your risk for osteoporosis can be identified using a bone density scan.  If you are 69 years of age or older, or if you are at risk for osteoporosis and fractures, ask your health care provider if you should be screened.  Ask your health care provider whether you should take a calcium or vitamin D supplement to lower your risk for osteoporosis.  Menopause may have certain physical symptoms and risks.  Hormone replacement therapy may reduce some of these symptoms and risks. Talk to your health care provider about whether hormone replacement therapy is right for you.  HOME CARE INSTRUCTIONS   Schedule regular health, dental, and eye exams.  Stay current with your immunizations.   Do not use any tobacco products including cigarettes, chewing tobacco, or electronic cigarettes.  If you are pregnant, do not drink alcohol.  If you are breastfeeding, limit how much and how often you drink alcohol.  Limit  alcohol intake to no more than 1 drink per day for nonpregnant women. One drink equals 12 ounces of beer, 5 ounces of wine, or 1 ounces of hard liquor.  Do not use street drugs.  Do not share needles.  Ask your health care provider for help if you need support or information about quitting drugs.  Tell your health care provider if you often feel depressed.  Tell your health care provider if you have ever been abused or do not feel safe at home.   This information is not intended to replace advice given to you by your health care provider. Make sure you discuss any questions you have with your health care provider.   Document Released: 02/19/2011 Document Revised: 08/27/2014 Document Reviewed: 07/08/2013 Elsevier Interactive Patient Education [employer].

## 2015-09-05 NOTE — Progress Notes (Signed)
Subjective:   Debra Kirk is a 82 y.o. female who presents for an Initial Medicare Annual Wellness Visit.  Review of Systems    No ROS.  Medicare Wellness Visit.  Cardiac Risk Factors include: diabetes mellitus;hypertension     Objective:    Today's Vitals   09/05/15 1100  BP: 122/62  Pulse: 60  Temp: 97.4 F (36.3 C)  TempSrc: Oral  Resp: 14  Height: 5' 6"  (1.676 m)  Weight: 157 lb (71.215 kg)  SpO2: 96%    Current Medications (verified) Outpatient Encounter Prescriptions as of 09/05/2015  Medication Sig  . amLODipine (NORVASC) 5 MG tablet TAKE 1 TABLET BY MOUTH DAILY  . aspirin 81 MG tablet Take 81 mg by mouth daily.  . Calcium Carbonate-Vitamin D (CALCIUM 600+D) 600-400 MG-UNIT per tablet Take 1 tablet by mouth 2 (two) times daily.  . CRESTOR 5 MG tablet TAKE 1 TABLET (5 MG TOTAL) BY MOUTH DAILY.  Marland Kitchen denosumab (PROLIA) 60 MG/ML SOLN injection Inject 60 mg into the skin every 6 (six) months. Administer in upper arm, thigh, or abdomen  . glimepiride (AMARYL) 2 MG tablet TAKE 1 TABLET BY MOUTH EVERY DAY WITH BREAKFAST  . glucose blood (BAYER CONTOUR TEST) test strip 1 each by Other route as needed for other (Check daily and as needed). Check sugar daily per orders.  ICD: E11.9 Diabetes mellutis  . hydrochlorothiazide (HYDRODIURIL) 25 MG tablet TAKE 1 TABLET BY MOUTH EVERY DAY  . metoCLOPramide (REGLAN) 5 MG tablet Take 5 mg by mouth 2 (two) times daily.   . metoprolol (LOPRESSOR) 50 MG tablet TAKE 1 AND 1/2 TABLET BY MOUTH TWICE A DAY  . omeprazole (PRILOSEC) 20 MG capsule TAKE ONE CAPSULE BY MOUTH TWICE A DAY   No facility-administered encounter medications on file as of 09/05/2015.    Allergies (verified) Penicillins   History: Past Medical History  Diagnosis Date  . Hypertension   . Diabetes mellitus ([place])   . Hypercholesterolemia   . Femoral neuropathy     left, elevated CRP, ESR, negative FANA and ANCA  . Carotid artery disease ([place])   . Compression  fracture     s/p fosamax, prolia  . Fibrocystic breast disease    Past Surgical History  Procedure Laterality Date  . Partial hysterectomy    . Inguinal hernia repair      left x 1, right x 2  . Appendectomy    . Ovarian cyst removal    . Cholecystectomy    . Hernia repair     Family History  Problem Relation Age of Onset  . Heart disease Father     myocardial infarction  . Heart disease Mother     myocardial infarction  . Diabetes Mother   . Congestive Heart Failure Mother   . Bone cancer Sister   . Diabetes Sister     x2  . Heart disease Sister     CABG  . Rheum arthritis Sister   . Breast cancer Neg Hx   . Colon cancer Neg Hx    Social History   Occupational History  . Not on file.   Social History Main Topics  . Smoking status: Former Smoker    Quit date: 08/20/1958  . Smokeless tobacco: Never Used  . Alcohol Use: No  . Drug Use: No  . Sexual Activity: No    Tobacco Counseling Counseling given: Not Answered   Activities of Daily Living In your present state of health, do you have  any difficulty performing the following activities: 09/05/2015  Hearing? N  Vision? N  Difficulty concentrating or making decisions? N  Walking or climbing stairs? Y  Dressing or bathing? N  Doing errands, shopping? N  Preparing Food and eating ? N  Using the Toilet? N  In the past six months, have you accidently leaked urine? N  Do you have problems with loss of bowel control? Y  Managing your Medications? N  Managing your Finances? N  Housekeeping or managing your Housekeeping? N    Immunizations and Health Maintenance Immunization History  Administered Date(s) Administered  . Influenza Split 06/02/2014  . Influenza,inj,Quad PF,36+ Mos 05/05/2013  . Influenza-Unspecified 06/16/2015  . Pneumococcal Conjugate-13 04/26/2015  . Tdap 08/25/2011   Health Maintenance Due  Topic Date Due  . ZOSTAVAX  10/31/1991    Patient Care Team: Einar Pheasant, MD as PCP -  General (Internal Medicine)  Indicate any recent Medical Services you may have received from other than [facility] providers in the past year (date may be approximate).     Assessment:   This is a routine wellness examination for Debra Kirk.  The goal of the wellness visit is to assist the patient how to close the gaps in care and create a preventative care plan for the patient.   VIT D Calcium as appropriate/ Osteoporosis risk reviewed.  Medications reviewed; taking without issues or barriers.  Safety issues reviewed; smoke detectors in the home. No firearms in the home. Wears seatbelts when driving or riding with others. No violence in the home.  The patient was oriented x 3; appropriate in dress and manner and no objective failures at ADL's or IADL's.   Shingles vaccine postponed per patient request; follow up with insurance.  DMII wo cmp nt st u-stable and followed by PCP Type 2 diabetes mell-stable and followed by PCP  Patient Concerns:  None at this time.  Follow up with PCP as needed.  Hearing/Vision screen Hearing Screening Comments: Passes the whisper test Vision Screening Comments: Followed by [facility], Dr. Jeni Salles Bilateral cataracts removed Reader glasses only  Dietary issues and exercise activities discussed: Type of exercise: walking, Time (Minutes): 10, Frequency (Times/Week): 4, Weekly Exercise (Minutes/Week): 40, Intensity: Mild  Goals    . Healthy Lifestyle     Make healthy food choices. Drink plenty of fluids, stay hydrated.      . Increase physical activity     Start chair/bed exercises to continue strengthening limbs and core, as demonstrated.  As tolerated.  Education provided.      Depression Screen PHQ 2/9 Scores 09/05/2015 03/22/2015 02/26/2014 11/23/2012 08/26/2012  PHQ - 2 Score 0 0 0 0 0    Fall Risk Fall Risk  09/05/2015 03/22/2015 02/26/2014 12/07/2012 08/26/2012  Falls in the past year? No No No No No    Cognitive Function: MMSE - Mini  Mental State Exam 09/05/2015  Orientation to time 5  Orientation to Place 5  Registration 3  Attention/ Calculation 5  Recall 3  Language- name 2 objects 2  Language- repeat 1  Language- follow 3 step command 3  Language- read & follow direction 1  Write a sentence 1  Copy design 1  Total score 30    Screening Tests Health Maintenance  Topic Date Due  . ZOSTAVAX  10/31/1991  . HEMOGLOBIN A1C  01/30/2016  . INFLUENZA VACCINE  03/20/2016  . URINE MICROALBUMIN  03/20/2016  . PNA vac Low Risk Adult (2 of 2 - PPSV23) 04/25/2016  .  MAMMOGRAM  05/17/2016  . OPHTHALMOLOGY EXAM  07/20/2016  . FOOT EXAM  08/01/2016  . TETANUS/TDAP  08/24/2021  . DEXA SCAN  Completed      Plan:   End of life planning; Advance aging; Advanced directives discussed. Copy requested of current HCPOA/Living Will.    During the course of the visit, Debra Kirk was educated and counseled about the following appropriate screening and preventive services:   Vaccines to include Pneumoccal, Influenza, Hepatitis B, Td, Zostavax, HCV  Electrocardiogram  Cardiovascular disease screening  Colorectal cancer screening  Bone density screening  Diabetes screening  Glaucoma screening  Mammography/PAP  Nutrition counseling  Smoking cessation counseling  Patient Instructions (the written plan) were given to the patient.    Varney Biles, LPN   [id number]    Reviewed above information.  Agree with plan.   Dr Nicki Reaper

## 2015-10-02 DIAGNOSIS — J209 Acute bronchitis, unspecified: Secondary | ICD-10-CM | POA: Diagnosis not present

## 2015-10-17 ENCOUNTER — Other Ambulatory Visit: Payer: Self-pay | Admitting: Internal Medicine

## 2015-10-25 DIAGNOSIS — K219 Gastro-esophageal reflux disease without esophagitis: Secondary | ICD-10-CM | POA: Diagnosis not present

## 2015-10-25 DIAGNOSIS — K3184 Gastroparesis: Secondary | ICD-10-CM | POA: Diagnosis not present

## 2015-10-25 DIAGNOSIS — E1143 Type 2 diabetes mellitus with diabetic autonomic (poly)neuropathy: Secondary | ICD-10-CM | POA: Diagnosis not present

## 2015-11-04 ENCOUNTER — Other Ambulatory Visit: Payer: Self-pay | Admitting: Internal Medicine

## 2015-11-14 ENCOUNTER — Other Ambulatory Visit: Payer: Self-pay | Admitting: Internal Medicine

## 2015-11-23 DIAGNOSIS — M810 Age-related osteoporosis without current pathological fracture: Secondary | ICD-10-CM | POA: Diagnosis not present

## 2015-11-30 DIAGNOSIS — M810 Age-related osteoporosis without current pathological fracture: Secondary | ICD-10-CM | POA: Diagnosis not present

## 2015-12-05 ENCOUNTER — Other Ambulatory Visit (INDEPENDENT_AMBULATORY_CARE_PROVIDER_SITE_OTHER): Payer: Medicare Other

## 2015-12-05 DIAGNOSIS — E7800 Pure hypercholesterolemia, unspecified: Secondary | ICD-10-CM

## 2015-12-05 DIAGNOSIS — I10 Essential (primary) hypertension: Secondary | ICD-10-CM | POA: Diagnosis not present

## 2015-12-05 DIAGNOSIS — E1140 Type 2 diabetes mellitus with diabetic neuropathy, unspecified: Secondary | ICD-10-CM | POA: Diagnosis not present

## 2015-12-05 LAB — BASIC METABOLIC PANEL
BUN: 17 mg/dL (ref 6–23)
CHLORIDE: 98 meq/L (ref 96–112)
CO2: 27 meq/L (ref 19–32)
Calcium: 9.4 mg/dL (ref 8.4–10.5)
Creatinine, Ser: 0.91 mg/dL (ref 0.40–1.20)
GFR: 62.58 mL/min (ref >60.00)
Glucose, Bld: 198 mg/dL — ABNORMAL HIGH (ref 70–99)
Potassium: 3.5 mEq/L (ref 3.5–5.1)
SODIUM: 136 meq/L (ref 135–145)

## 2015-12-05 LAB — HEPATIC FUNCTION PANEL
ALBUMIN: 4 g/dL (ref 3.5–5.2)
ALT: 10 U/L (ref 0–35)
AST: 14 U/L (ref 0–37)
Alkaline Phosphatase: 38 U/L — ABNORMAL LOW (ref 39–117)
Bilirubin, Direct: 0.2 mg/dL (ref 0.0–0.3)
Total Bilirubin: 0.7 mg/dL (ref 0.2–1.2)
Total Protein: 7.3 g/dL (ref 6.0–8.3)

## 2015-12-05 LAB — LIPID PANEL
CHOLESTEROL: 176 mg/dL (ref 0–200)
HDL: 42.3 mg/dL (ref >39.00)
LDL Cholesterol: 107 mg/dL — ABNORMAL HIGH (ref 0–99)
NonHDL: 133.35
Total CHOL/HDL Ratio: 4
Triglycerides: 131 mg/dL (ref 0.0–149.0)
VLDL: 26.2 mg/dL (ref 0.0–40.0)

## 2015-12-05 LAB — HEMOGLOBIN A1C: HEMOGLOBIN A1C: 7.7 % — AB (ref 4.6–6.5)

## 2015-12-06 ENCOUNTER — Ambulatory Visit (INDEPENDENT_AMBULATORY_CARE_PROVIDER_SITE_OTHER): Payer: Medicare Other | Admitting: Internal Medicine

## 2015-12-06 ENCOUNTER — Encounter: Payer: Self-pay | Admitting: Internal Medicine

## 2015-12-06 VITALS — BP 138/64 | HR 74 | Temp 97.8°F | Resp 18 | Ht 66.0 in | Wt 153.5 lb

## 2015-12-06 DIAGNOSIS — I10 Essential (primary) hypertension: Secondary | ICD-10-CM | POA: Diagnosis not present

## 2015-12-06 DIAGNOSIS — E1140 Type 2 diabetes mellitus with diabetic neuropathy, unspecified: Secondary | ICD-10-CM | POA: Diagnosis not present

## 2015-12-06 DIAGNOSIS — G6289 Other specified polyneuropathies: Secondary | ICD-10-CM | POA: Diagnosis not present

## 2015-12-06 DIAGNOSIS — K3184 Gastroparesis: Secondary | ICD-10-CM

## 2015-12-06 DIAGNOSIS — E7800 Pure hypercholesterolemia, unspecified: Secondary | ICD-10-CM

## 2015-12-06 MED ORDER — SERTRALINE HCL 50 MG PO TABS: 50.0000 mg | ORAL_TABLET | Freq: Every day | ORAL | Status: DC

## 2015-12-06 NOTE — Progress Notes (Signed)
Patient ID: Debra Kirk, female   DOB: 1929-09-17, 82 y.o.   MRN: [id number]   Subjective:    Patient ID: Debra Kirk, female    DOB: 10/04/1929, 82 y.o.   MRN: [id number]  HPI  Patient here for a scheduled follow up.  We discussed her recent lab results.  a1c 7.7.  Discussed diet and exercise.  Discussed importance of eating regular meals and not skipping meals.  Discussed importance of eating a bedtime snack. AM sugars averaging 150-180 and pm sugars averaging 150-170.  Discussed adding medication or adjusting medication.  She prefers to work on her diet and exercise.  Agreeable to nutritionist referral.  Acid reflux controlled on prilosec.  No abdominal pain or cramping.  Bowels stable.     Past Medical History  Diagnosis Date  . Hypertension   . Diabetes mellitus ([place])   . Hypercholesterolemia   . Femoral neuropathy     left, elevated CRP, ESR, negative FANA and ANCA  . Carotid artery disease ([place])   . Compression fracture     s/p fosamax, prolia  . Fibrocystic breast disease    Past Surgical History  Procedure Laterality Date  . Partial hysterectomy    . Inguinal hernia repair      left x 1, right x 2  . Appendectomy    . Ovarian cyst removal    . Cholecystectomy    . Hernia repair     Family History  Problem Relation Age of Onset  . Heart disease Father     myocardial infarction  . Heart disease Mother     myocardial infarction  . Diabetes Mother   . Congestive Heart Failure Mother   . Bone cancer Sister   . Diabetes Sister     x2  . Heart disease Sister     CABG  . Rheum arthritis Sister   . Breast cancer Neg Hx   . Colon cancer Neg Hx    Social History   Social History  . Marital Status: Widowed    Spouse Name: N/A  . Number of Children: 3  . Years of Education: N/A   Social History Main Topics  . Smoking status: Former Smoker    Quit date: 08/20/1958  . Smokeless tobacco: Never Used  . Alcohol Use: No  . Drug Use: No  . Sexual  Activity: No   Other Topics Concern  . None   Social History Narrative    Outpatient Encounter Prescriptions as of 12/06/2015  Medication Sig  . amLODipine (NORVASC) 5 MG tablet TAKE 1 TABLET BY MOUTH DAILY  . aspirin 81 MG tablet Take 81 mg by mouth daily.  . Calcium Carbonate-Vitamin D (CALCIUM 600+D) 600-400 MG-UNIT per tablet Take 1 tablet by mouth 2 (two) times daily.  . CRESTOR 5 MG tablet TAKE 1 TABLET (5 MG TOTAL) BY MOUTH DAILY.  Marland Kitchen denosumab (PROLIA) 60 MG/ML SOLN injection Inject 60 mg into the skin every 6 (six) months. Administer in upper arm, thigh, or abdomen  . glimepiride (AMARYL) 2 MG tablet TAKE 1 TABLET BY MOUTH EVERY DAY WITH BREAKFAST  . glucose blood (BAYER CONTOUR TEST) test strip 1 each by Other route as needed for other (Check daily and as needed). Check sugar daily per orders.  ICD: E11.9 Diabetes mellutis  . hydrochlorothiazide (HYDRODIURIL) 25 MG tablet TAKE 1 TABLET BY MOUTH EVERY DAY  . metoCLOPramide (REGLAN) 5 MG tablet Take 5 mg by mouth 2 (two) times daily.   . metoprolol (  LOPRESSOR) 50 MG tablet TAKE 1 AND 1/2 TABLET BY MOUTH TWICE A DAY  . omeprazole (PRILOSEC) 20 MG capsule TAKE ONE CAPSULE BY MOUTH TWICE A DAY  . sertraline (ZOLOFT) 50 MG tablet Take 1 tablet (50 mg total) by mouth daily.   No facility-administered encounter medications on file as of 12/06/2015.    Review of Systems  Constitutional: Negative for appetite change and unexpected weight change.  HENT: Negative for congestion and sinus pressure.   Respiratory: Negative for cough, chest tightness and shortness of breath.   Cardiovascular: Negative for chest pain, palpitations and leg swelling.  Gastrointestinal: Negative for nausea, vomiting, abdominal pain and diarrhea.  Genitourinary: Negative for dysuria and difficulty urinating.  Musculoskeletal: Negative for back pain and joint swelling.  Skin: Negative for color change and rash.  Neurological: Negative for dizziness,  light-headedness and headaches.  Psychiatric/Behavioral: Negative for dysphoric mood and agitation.       Increased stress and anxiety.  Worries.  Discussed treatment options.  She is open to take medication.        Objective:     Blood pressure rechecked by me:  136/72  Physical Exam  Constitutional: She appears well-developed and well-nourished. No distress.  HENT:  Nose: Nose normal.  Mouth/Throat: Oropharynx is clear and moist.  Neck: Neck supple. No thyromegaly present.  Cardiovascular: Normal rate and regular rhythm.   Pulmonary/Chest: Breath sounds normal. No respiratory distress. She has no wheezes.  Abdominal: Soft. Bowel sounds are normal. There is no tenderness.  Musculoskeletal: She exhibits no edema or tenderness.  Lymphadenopathy:    She has no cervical adenopathy.  Skin: No rash noted. No erythema.  Psychiatric: She has a normal mood and affect. Her behavior is normal.    BP 138/64 mmHg  Pulse 74  Temp(Src) 97.8 F (36.6 C) (Oral)  Resp 18  Ht 5' 6"  (1.676 m)  Wt 153 lb 8 oz (69.627 kg)  BMI 24.79 kg/m2  SpO2 94% Wt Readings from Last 3 Encounters:  12/06/15 153 lb 8 oz (69.627 kg)  09/05/15 157 lb (71.215 kg)  08/02/15 156 lb 4 oz (70.875 kg)     Lab Results  Component Value Date   WBC 6.3 03/21/2015   HGB 13.6 03/21/2015   HCT 39.9 03/21/2015   PLT 214.0 03/21/2015   GLUCOSE 198* 12/05/2015   CHOL 176 12/05/2015   TRIG 131.0 12/05/2015   HDL 42.30 12/05/2015   LDLCALC 107* 12/05/2015   ALT 10 12/05/2015   AST 14 12/05/2015   NA 136 12/05/2015   K 3.5 12/05/2015   CL 98 12/05/2015   CREATININE 0.91 12/05/2015   BUN 17 12/05/2015   CO2 27 12/05/2015   TSH 1.07 03/21/2015   HGBA1C 7.7* 12/05/2015   MICROALBUR 3.3* 03/21/2015    Mm Digital Screening Bilateral  05/18/2015  CLINICAL DATA:  Screening. EXAM: DIGITAL SCREENING BILATERAL MAMMOGRAM WITH CAD COMPARISON:  Previous exam(s). ACR Breast Density Category b: There are scattered areas  of fibroglandular density. FINDINGS: There are no findings suspicious for malignancy. Images were processed with CAD. IMPRESSION: No mammographic evidence of malignancy. A result letter of this screening mammogram will be mailed directly to the patient. RECOMMENDATION: Screening mammogram in one year. (Code:SM-B-01Y) BI-RADS CATEGORY  1: Negative. Electronically Signed   By: Franki Cabot M.D.   On: 05/18/2015 11:42       Assessment & Plan:   Problem List Items Addressed This Visit    Diabetes mellitus ([place]) - Primary  Low carb diet and exercise.  Follow met b and a1c.  Hold on making changes in her medication.  Refer to nutritionist as outlined.        Relevant Orders   Amb ref to Medical Nutrition Therapy-MNT   Gastroparesis    Followed by GI.  On reglan.  Just evaluated.  Refer to a nutritionist to help with diet management for gastroparesis and for her diabetes.        Relevant Orders   Amb ref to Medical Nutrition Therapy-MNT   Hypercholesterolemia    On crestor.  Low cholesterol diet and exercise.  Follow lipid panel and liver function tests.   Lab Results  Component Value Date   CHOL 176 12/05/2015   HDL 42.30 12/05/2015   LDLCALC 107* 12/05/2015   TRIG 131.0 12/05/2015   CHOLHDL 4 12/05/2015        Hypertension    Blood pressure under good control.  Continue same medication regimen.  Follow pressures.  Follow metabolic panel.        Peripheral neuropathy ([facility])    Stable.  Follow.        Relevant Medications   sertraline (ZOLOFT) 50 MG tablet       Einar Pheasant, MD

## 2015-12-06 NOTE — Progress Notes (Signed)
Pre-visit discussion using our clinic review tool. No additional management support is needed unless otherwise documented below in the visit note.  

## 2015-12-12 ENCOUNTER — Encounter: Payer: Self-pay | Admitting: Internal Medicine

## 2015-12-12 NOTE — Assessment & Plan Note (Signed)
Blood pressure under good control.  Continue same medication regimen.  Follow pressures.  Follow metabolic panel.   

## 2015-12-12 NOTE — Assessment & Plan Note (Addendum)
On crestor.  Low cholesterol diet and exercise.  Follow lipid panel and liver function tests.   Lab Results  Component Value Date   CHOL 176 12/05/2015   HDL 42.30 12/05/2015   LDLCALC 107* 12/05/2015   TRIG 131.0 12/05/2015   CHOLHDL 4 12/05/2015

## 2015-12-12 NOTE — Assessment & Plan Note (Signed)
Followed by GI.  On reglan.  Just evaluated.  Refer to a nutritionist to help with diet management for gastroparesis and for her diabetes.

## 2015-12-12 NOTE — Assessment & Plan Note (Signed)
Stable.  Follow.   

## 2015-12-12 NOTE — Assessment & Plan Note (Signed)
Low carb diet and exercise.  Follow met b and a1c.  Hold on making changes in her medication.  Refer to nutritionist as outlined.

## 2016-01-09 ENCOUNTER — Encounter: Payer: Self-pay | Admitting: Dietician

## 2016-01-09 ENCOUNTER — Encounter: Payer: Medicare Other | Attending: Internal Medicine | Admitting: Dietician

## 2016-01-09 VITALS — Ht 66.0 in | Wt 154.0 lb

## 2016-01-09 DIAGNOSIS — E1143 Type 2 diabetes mellitus with diabetic autonomic (poly)neuropathy: Secondary | ICD-10-CM | POA: Insufficient documentation

## 2016-01-09 DIAGNOSIS — E119 Type 2 diabetes mellitus without complications: Secondary | ICD-10-CM

## 2016-01-09 NOTE — Patient Instructions (Addendum)
Balance meals with protein (refer to list), 2-3 servings of carbohydrate (starch, fruit or milk) and non-starchy vegetables. Try to include a protein with each meal. Can also add to snacks to total 5-6 oz. Per day. Avoid most raw vegetables and cook vegetables until very soft. Chew food well to begin digestion in the mouth. Avoid deep fat fried foods that due to fat take longer to digest. Consider arm chair exercises. Refer to gastroparesis hand-out.

## 2016-01-10 NOTE — Progress Notes (Signed)
Medical Nutrition Therapy: Visit start time:1500 end time: 1400 Assessment:  Diagnosis: Type 2 diabetes, gastroparesis Past medical history: hypertension, elevated cholesterol, peripheral neuropathy Psychosocial issues/ stress concerns: Patient rates her stress as moderate and indicates "ok" as to how well she is dealing with her stress. Preferred learning method:  . No preference indicated  Current weight: 154 lbs  Height: 66 in Medications, supplements: see list Progress and evaluation:  Patient in for initial medical nutrition therapy visit. She reports a 7 year history of diabetes. Her most recent HgA1c was 7.7 and increase from 7.4, 6 months ago. She checks her glucose daily and reports her FBG's had increased several months ago even though her diet had remained relatively consistent. She reports that her FBG's presently are back in range of 150 or less. She reports a relatively stable weight for several years. She reports that she was diagnosed with gastroparesis in 4-5 years ago but states she has had minimal symptoms. She does limit raw fruits and vegetables.She does report that due to neuropathy she cannot do the things she once could and she tires more easily. She lives by herself and does minimal cooking. She eats 3 meals per day and 3 small snacks. Her present diet is possibly low in protein and low in fruits/vegetables. Her diet is low in sources of calcium but she takes a calcium supplement.Her main beverage is water. Has eliminated sugar sweetened beverages since diabetes diagnosis and only occasionally eats "sweets".  Physical activity: walks in her home for 15 minutes 4 days/week.  Dietary Intake:  Usual eating pattern includes 3 meals and 3 snacks per day. Dining out frequency: 2 meals per week.  Breakfast: 7:30am- cornflakes or cheerios or bran cereal/ whole milk (very little milk); low calorie juice -4-6 oz. Snack: crackers/peanut butter pkg Lunch: small salad with grated  cheese/croutons or pimento cheese sandwich, or 1 egg/toast, water or diet coke Snack: cookie or crakers Supper: 6:00pm- Her son sometimes brings dinner such as a hamburger or hotdog or fried chicken, mashed potatoes, green beans or she heats up canned dumplings or spaghetti Snack: package of crackers Beverages: water, diet coke, reduced calorie orange or cranberry juice.  Nutrition Care Education: Diabetes:   Commended patient on her consistency of meal/snack schedule. Reviewed carbohydrate foods and servings and encouraged to include canned or soft cooked fruits and yogurt as part of her carbohydrate servings. Reviewed protein sources and encouraged to include 5-6 oz. of protein/protein equivalents daily. Discussed how decreased activity due to more limited mobility could be effecting blood sugars. Discussed examples of arm chair exercises that might help improve increase insulin sensitivity.  Gastroparesis: Reviewed dietary guidelines for gastroparesis as delayed stomach emptying can lead to elevated blood sugar post meal.   Nutritional Diagnosis:  NI-5.11.1 Predicted suboptimal nutrient intake As related to low intake of fruits/vegetables and possibly protein.  As evidenced by diet history..  Intervention:  Balance meals with protein (refer to list), 2-3 servings of carbohydrate (starch, fruit or milk) and non-starchy vegetables. Try to include a protein with each meal. Can also add to snacks to total 5-6 oz. Per day. Avoid most raw vegetables and cook vegetables until very soft. Add soft cooked fruits or canned fruits as part of carbohydrate intake. Chew food well to begin digestion in the mouth. Avoid deep fat fried foods, that due to fat take longer to digest. Consider arm chair exercises. Refer to gastroparesis hand-out.  Education Materials given:  . General diet guidelines for gastroparesis. . Food  lists/ Planning A Balanced Meal . Sample meal pattern/ menus . Goals/  instructions Learner/ who was taught:  . Patient   Level of understanding: Marland Kitchen Verbalized understanding and stated, "This has been helpful". Learning barriers: . None Willingness to learn/ readiness for change: . Acceptance, ready for change Monitoring and Evaluation:  No follow-up scheduled. Encouraged patient to call if has further questions of if desires further help with her diet/nutrition.

## 2016-01-16 ENCOUNTER — Other Ambulatory Visit: Payer: Self-pay | Admitting: Internal Medicine

## 2016-01-30 ENCOUNTER — Encounter: Payer: Self-pay | Admitting: Internal Medicine

## 2016-01-30 ENCOUNTER — Ambulatory Visit (INDEPENDENT_AMBULATORY_CARE_PROVIDER_SITE_OTHER): Payer: Medicare Other | Admitting: Internal Medicine

## 2016-01-30 VITALS — BP 110/60 | HR 58 | Temp 98.2°F | Resp 18 | Ht 66.0 in | Wt 152.0 lb

## 2016-01-30 DIAGNOSIS — G6289 Other specified polyneuropathies: Secondary | ICD-10-CM

## 2016-01-30 DIAGNOSIS — I10 Essential (primary) hypertension: Secondary | ICD-10-CM | POA: Diagnosis not present

## 2016-01-30 DIAGNOSIS — R42 Dizziness and giddiness: Secondary | ICD-10-CM

## 2016-01-30 DIAGNOSIS — R300 Dysuria: Secondary | ICD-10-CM

## 2016-01-30 DIAGNOSIS — K3184 Gastroparesis: Secondary | ICD-10-CM

## 2016-01-30 DIAGNOSIS — E7800 Pure hypercholesterolemia, unspecified: Secondary | ICD-10-CM

## 2016-01-30 DIAGNOSIS — E119 Type 2 diabetes mellitus without complications: Secondary | ICD-10-CM

## 2016-01-30 LAB — URINALYSIS, ROUTINE W REFLEX MICROSCOPIC
Bilirubin Urine: NEGATIVE
HGB URINE DIPSTICK: NEGATIVE
Ketones, ur: NEGATIVE
Leukocytes, UA: NEGATIVE
Nitrite: NEGATIVE
RBC / HPF: NONE SEEN (ref 0–?)
Specific Gravity, Urine: 1.025 (ref 1.000–1.030)
Total Protein, Urine: NEGATIVE
URINE GLUCOSE: 500 — AB
Urobilinogen, UA: 0.2 (ref 0.0–1.0)
pH: 5.5 (ref 5.0–8.0)

## 2016-01-30 MED ORDER — SERTRALINE HCL 50 MG PO TABS: 50.0000 mg | ORAL_TABLET | Freq: Every day | ORAL | Status: DC

## 2016-01-30 MED ORDER — MECLIZINE HCL 12.5 MG PO TABS: 12.5000 mg | ORAL_TABLET | Freq: Two times a day (BID) | ORAL | Status: AC | PRN

## 2016-01-30 NOTE — Progress Notes (Signed)
Patient ID: Debra Kirk, female   DOB: [birth year], 82 y.o.   MRN: [id number]   Subjective:    Patient ID: Debra Kirk, female    DOB: 30-Mar-1930, 82 y.o.   MRN: [id number]  HPI  Patient here for a scheduled follow up.  She feels better.  Taking zoloft.  Feels this is helping.  Tries to stay active.  Reports no cardiac symptoms with increased activity or exertion.  No sob.  No acid reflux.  Recently saw GI.  On reglan.  Doing well on this medication.  States sugars are doing better.  AM sugars averaging 150 in am and 112-150 in the pm.  No nausea or vomiting.  Bowels stable.  She does report dysuria that she has noticed intermittently - recently.  Will increased fluid intake and symptoms improve temporarily.  She also has a history of vertigo.  Will flare intermittently.  Has seen ENT.  S/p Epley maneuvers.  Having a mild case now and request meclizine.  States has taken previously and works well for her.    Past Medical History  Diagnosis Date  . Hypertension   . Diabetes mellitus ([place])   . Hypercholesterolemia   . Femoral neuropathy     left, elevated CRP, ESR, negative FANA and ANCA  . Carotid artery disease ([place])   . Compression fracture     s/p fosamax, prolia  . Fibrocystic breast disease    Past Surgical History  Procedure Laterality Date  . Partial hysterectomy    . Inguinal hernia repair      left x 1, right x 2  . Appendectomy    . Ovarian cyst removal    . Cholecystectomy    . Hernia repair     Family History  Problem Relation Age of Onset  . Heart disease Father     myocardial infarction  . Heart disease Mother     myocardial infarction  . Diabetes Mother   . Congestive Heart Failure Mother   . Bone cancer Sister   . Diabetes Sister     x2  . Heart disease Sister     CABG  . Rheum arthritis Sister   . Breast cancer Neg Hx   . Colon cancer Neg Hx    Social History   Social History  . Marital Status: Widowed    Spouse Name: N/A  . Number of  Children: 3  . Years of Education: N/A   Social History Main Topics  . Smoking status: Former Smoker    Quit date: 08/20/1958  . Smokeless tobacco: Never Used  . Alcohol Use: No  . Drug Use: No  . Sexual Activity: No   Other Topics Concern  . None   Social History Narrative    Outpatient Encounter Prescriptions as of 01/30/2016  Medication Sig  . amLODipine (NORVASC) 5 MG tablet TAKE 1 TABLET BY MOUTH DAILY  . aspirin 81 MG tablet Take 81 mg by mouth daily.  . Calcium Carbonate-Vitamin D (CALCIUM 600+D) 600-400 MG-UNIT per tablet Take 1 tablet by mouth 2 (two) times daily.  Marland Kitchen denosumab (PROLIA) 60 MG/ML SOLN injection Inject 60 mg into the skin every 6 (six) months. Administer in upper arm, thigh, or abdomen  . glimepiride (AMARYL) 2 MG tablet TAKE 1 TABLET BY MOUTH EVERY DAY WITH BREAKFAST  . glucose blood (BAYER CONTOUR TEST) test strip 1 each by Other route as needed for other (Check daily and as needed). Check sugar daily per orders.  ICD:  E11.9 Diabetes mellutis  . hydrochlorothiazide (HYDRODIURIL) 25 MG tablet TAKE 1 TABLET BY MOUTH EVERY DAY  . metoCLOPramide (REGLAN) 5 MG tablet Take 5 mg by mouth 2 (two) times daily.   . metoprolol (LOPRESSOR) 50 MG tablet TAKE 1 AND 1/2 TABLET BY MOUTH TWICE A DAY  . omeprazole (PRILOSEC) 20 MG capsule TAKE ONE CAPSULE BY MOUTH TWICE A DAY  . rosuvastatin (CRESTOR) 5 MG tablet TAKE 1 TABLET (5 MG TOTAL) BY MOUTH DAILY.  Marland Kitchen sertraline (ZOLOFT) 50 MG tablet Take 1 tablet (50 mg total) by mouth daily.  . [DISCONTINUED] sertraline (ZOLOFT) 50 MG tablet Take 1 tablet (50 mg total) by mouth daily.  . meclizine (ANTIVERT) 12.5 MG tablet Take 1 tablet (12.5 mg total) by mouth 2 (two) times daily as needed for dizziness.   No facility-administered encounter medications on file as of 01/30/2016.    Review of Systems  Constitutional: Negative for appetite change and unexpected weight change.  HENT: Negative for congestion and sinus pressure.     Respiratory: Negative for cough, chest tightness and shortness of breath.   Cardiovascular: Negative for chest pain, palpitations and leg swelling.  Gastrointestinal: Negative for nausea, vomiting, abdominal pain and diarrhea.  Genitourinary: Positive for dysuria. Negative for difficulty urinating.  Musculoskeletal: Negative for myalgias and joint swelling.  Skin: Negative for color change and rash.  Neurological: Positive for dizziness. Negative for headaches.  Psychiatric/Behavioral: Negative for dysphoric mood and agitation.       Objective:    Physical Exam  Constitutional: She appears well-developed and well-nourished. No distress.  HENT:  Nose: Nose normal.  Mouth/Throat: Oropharynx is clear and moist.  Neck: Neck supple. No thyromegaly present.  Cardiovascular: Normal rate and regular rhythm.   Pulmonary/Chest: Breath sounds normal. No respiratory distress. She has no wheezes.  Abdominal: Soft. Bowel sounds are normal. There is no tenderness.  Musculoskeletal: She exhibits no edema or tenderness.  Lymphadenopathy:    She has no cervical adenopathy.  Skin: No rash noted. No erythema.  Psychiatric: She has a normal mood and affect. Her behavior is normal.    BP 110/60 mmHg  Pulse 58  Temp(Src) 98.2 F (36.8 C) (Oral)  Resp 18  Ht 5' 6"  (1.676 m)  Wt 152 lb (68.947 kg)  BMI 24.55 kg/m2  SpO2 95% Wt Readings from Last 3 Encounters:  01/30/16 152 lb (68.947 kg)  01/09/16 154 lb (69.854 kg)  12/06/15 153 lb 8 oz (69.627 kg)     Lab Results  Component Value Date   WBC 6.3 03/21/2015   HGB 13.6 03/21/2015   HCT 39.9 03/21/2015   PLT 214.0 03/21/2015   GLUCOSE 198* 12/05/2015   CHOL 176 12/05/2015   TRIG 131.0 12/05/2015   HDL 42.30 12/05/2015   LDLCALC 107* 12/05/2015   ALT 10 12/05/2015   AST 14 12/05/2015   NA 136 12/05/2015   K 3.5 12/05/2015   CL 98 12/05/2015   CREATININE 0.91 12/05/2015   BUN 17 12/05/2015   CO2 27 12/05/2015   TSH 1.07  03/21/2015   HGBA1C 7.7* 12/05/2015   MICROALBUR 3.3* 03/21/2015    Mm Digital Screening Bilateral  05/18/2015  CLINICAL DATA:  Screening. EXAM: DIGITAL SCREENING BILATERAL MAMMOGRAM WITH CAD COMPARISON:  Previous exam(s). ACR Breast Density Category b: There are scattered areas of fibroglandular density. FINDINGS: There are no findings suspicious for malignancy. Images were processed with CAD. IMPRESSION: No mammographic evidence of malignancy. A result letter of this screening mammogram will be mailed  directly to the patient. RECOMMENDATION: Screening mammogram in one year. (Code:SM-B-01Y) BI-RADS CATEGORY  1: Negative. Electronically Signed   By: Franki Cabot M.D.   On: 05/18/2015 11:42       Assessment & Plan:   Problem List Items Addressed This Visit    Diabetes mellitus ([place])    Sugars as outlined.  Appear to be improved.  Follow met b and a1c.  Low carb diet and exercise.  Recently saw a nutritionist.        Relevant Orders   Hemoglobin Z5A   Basic metabolic panel   Microalbumin / creatinine urine ratio   Dizziness    Has been worked up by ENT.  Has vertigo.  Mild case now.  Request meclizine.  Has worked well for her in the past.  States feels similar.  No unusual symptoms.  rx - meclizine.  Follow.       Dysuria - Primary    Check urinalysis and culture to confirm no infection.        Relevant Orders   Urinalysis, Routine w reflex microscopic (not at [facility]) (Completed)   CULTURE, URINE COMPREHENSIVE   Gastroparesis    On reglan.  Doing well on this medication.  Followed by GI.       Hypercholesterolemia    On crestor.  Low cholesterol diet and exercise.  Follow lipid panel and liver function tests.       Relevant Orders   Lipid panel   Hepatic function panel   Hypertension    Blood pressure under good control.  Continue same medication regimen.  Follow pressures.  Follow metabolic panel.        Relevant Orders   CBC with Differential/Platelet   TSH    Peripheral neuropathy ([facility])    Stable.  Follow.       Relevant Medications   sertraline (ZOLOFT) 50 MG tablet       Einar Pheasant, MD

## 2016-01-30 NOTE — Progress Notes (Signed)
Pre-visit discussion using our clinic review tool. No additional management support is needed unless otherwise documented below in the visit note.  

## 2016-01-31 ENCOUNTER — Encounter: Payer: Self-pay | Admitting: Internal Medicine

## 2016-01-31 DIAGNOSIS — R300 Dysuria: Secondary | ICD-10-CM | POA: Insufficient documentation

## 2016-01-31 DIAGNOSIS — R42 Dizziness and giddiness: Secondary | ICD-10-CM | POA: Insufficient documentation

## 2016-01-31 NOTE — Assessment & Plan Note (Signed)
Check urinalysis and culture to confirm no infection.    

## 2016-01-31 NOTE — Assessment & Plan Note (Signed)
On crestor.  Low cholesterol diet and exercise.  Follow lipid panel and liver function tests.   

## 2016-01-31 NOTE — Assessment & Plan Note (Signed)
Stable.  Follow.   

## 2016-01-31 NOTE — Assessment & Plan Note (Signed)
Blood pressure under good control.  Continue same medication regimen.  Follow pressures.  Follow metabolic panel.   

## 2016-01-31 NOTE — Assessment & Plan Note (Signed)
Has been worked up by ENT.  Has vertigo.  Mild case now.  Request meclizine.  Has worked well for her in the past.  States feels similar.  No unusual symptoms.  rx - meclizine.  Follow.

## 2016-01-31 NOTE — Assessment & Plan Note (Addendum)
Sugars as outlined.  Appear to be improved.  Follow met b and a1c.  Low carb diet and exercise.  Recently saw a nutritionist.

## 2016-01-31 NOTE — Assessment & Plan Note (Signed)
On reglan.  Doing well on this medication.  Followed by GI.

## 2016-02-01 LAB — CULTURE, URINE COMPREHENSIVE

## 2016-02-14 DIAGNOSIS — I10 Essential (primary) hypertension: Secondary | ICD-10-CM | POA: Diagnosis not present

## 2016-02-14 DIAGNOSIS — E7800 Pure hypercholesterolemia, unspecified: Secondary | ICD-10-CM | POA: Diagnosis not present

## 2016-02-14 DIAGNOSIS — R0602 Shortness of breath: Secondary | ICD-10-CM | POA: Diagnosis not present

## 2016-02-14 DIAGNOSIS — R079 Chest pain, unspecified: Secondary | ICD-10-CM | POA: Diagnosis not present

## 2016-02-20 ENCOUNTER — Other Ambulatory Visit: Payer: Self-pay | Admitting: Internal Medicine

## 2016-04-14 ENCOUNTER — Other Ambulatory Visit: Payer: Self-pay | Admitting: Internal Medicine

## 2016-04-25 ENCOUNTER — Other Ambulatory Visit (INDEPENDENT_AMBULATORY_CARE_PROVIDER_SITE_OTHER): Payer: Medicare Other

## 2016-04-25 DIAGNOSIS — E119 Type 2 diabetes mellitus without complications: Secondary | ICD-10-CM | POA: Diagnosis not present

## 2016-04-25 DIAGNOSIS — E7800 Pure hypercholesterolemia, unspecified: Secondary | ICD-10-CM | POA: Diagnosis not present

## 2016-04-25 DIAGNOSIS — I10 Essential (primary) hypertension: Secondary | ICD-10-CM

## 2016-04-25 LAB — CBC WITH DIFFERENTIAL/PLATELET
BASOS ABS: 0 10*3/uL (ref 0.0–0.1)
Basophils Relative: 0.5 % (ref 0.0–3.0)
EOS ABS: 0 10*3/uL (ref 0.0–0.7)
EOS PCT: 0.1 % (ref 0.0–5.0)
HEMATOCRIT: 39.5 % (ref 36.0–46.0)
HEMOGLOBIN: 13.4 g/dL (ref 12.0–15.0)
Lymphocytes Relative: 25.5 % (ref 12.0–46.0)
Lymphs Abs: 1.3 10*3/uL (ref 0.7–4.0)
MCHC: 34 g/dL (ref 30.0–36.0)
MCV: 85.6 fl (ref 78.0–100.0)
Monocytes Absolute: 0.5 10*3/uL (ref 0.1–1.0)
Monocytes Relative: 8.8 % (ref 3.0–12.0)
NEUTROS ABS: 3.3 10*3/uL (ref 1.4–7.7)
Neutrophils Relative %: 65.1 % (ref 43.0–77.0)
Platelets: 238 10*3/uL (ref 150.0–400.0)
RBC: 4.62 Mil/uL (ref 3.87–5.11)
RDW: 15 % (ref 11.5–15.5)
WBC: 5.1 10*3/uL (ref 4.0–10.5)

## 2016-04-25 LAB — HEPATIC FUNCTION PANEL
ALBUMIN: 4.1 g/dL (ref 3.5–5.2)
ALT: 11 U/L (ref 0–35)
AST: 14 U/L (ref 0–37)
Alkaline Phosphatase: 42 U/L (ref 39–117)
Bilirubin, Direct: 0.2 mg/dL (ref 0.0–0.3)
TOTAL PROTEIN: 7.6 g/dL (ref 6.0–8.3)
Total Bilirubin: 0.8 mg/dL (ref 0.2–1.2)

## 2016-04-25 LAB — LIPID PANEL
CHOL/HDL RATIO: 4
CHOLESTEROL: 181 mg/dL (ref 0–200)
HDL: 43.5 mg/dL (ref >39.00)
LDL CALC: 110 mg/dL — AB (ref 0–99)
NonHDL: 137.88
TRIGLYCERIDES: 138 mg/dL (ref 0.0–149.0)
VLDL: 27.6 mg/dL (ref 0.0–40.0)

## 2016-04-25 LAB — BASIC METABOLIC PANEL
BUN: 19 mg/dL (ref 6–23)
CO2: 32 mEq/L (ref 19–32)
CREATININE: 0.88 mg/dL (ref 0.40–1.20)
Calcium: 9.3 mg/dL (ref 8.4–10.5)
Chloride: 95 mEq/L — ABNORMAL LOW (ref 96–112)
GFR: 64.99 mL/min (ref >60.00)
Glucose, Bld: 186 mg/dL — ABNORMAL HIGH (ref 70–99)
Potassium: 3.6 mEq/L (ref 3.5–5.1)
Sodium: 133 mEq/L — ABNORMAL LOW (ref 135–145)

## 2016-04-25 LAB — MICROALBUMIN / CREATININE URINE RATIO
Creatinine,U: 354.4 mg/dL
MICROALB UR: 4.9 mg/dL — AB (ref 0.0–1.9)
Microalb Creat Ratio: 1.4 mg/g (ref 0.0–30.0)

## 2016-04-25 LAB — HEMOGLOBIN A1C: Hgb A1c MFr Bld: 6.7 % — ABNORMAL HIGH (ref 4.6–6.5)

## 2016-04-26 LAB — TSH: TSH: 1.35 u[IU]/mL (ref 0.35–4.50)

## 2016-04-30 ENCOUNTER — Encounter: Payer: Self-pay | Admitting: Internal Medicine

## 2016-04-30 ENCOUNTER — Encounter (INDEPENDENT_AMBULATORY_CARE_PROVIDER_SITE_OTHER): Payer: Self-pay

## 2016-04-30 ENCOUNTER — Ambulatory Visit (INDEPENDENT_AMBULATORY_CARE_PROVIDER_SITE_OTHER): Payer: Medicare Other | Admitting: Internal Medicine

## 2016-04-30 ENCOUNTER — Other Ambulatory Visit: Payer: Self-pay | Admitting: Internal Medicine

## 2016-04-30 VITALS — BP 126/68 | HR 62 | Temp 96.8°F | Ht 66.0 in | Wt 153.8 lb

## 2016-04-30 DIAGNOSIS — E119 Type 2 diabetes mellitus without complications: Secondary | ICD-10-CM | POA: Diagnosis not present

## 2016-04-30 DIAGNOSIS — G6289 Other specified polyneuropathies: Secondary | ICD-10-CM

## 2016-04-30 DIAGNOSIS — Z23 Encounter for immunization: Secondary | ICD-10-CM

## 2016-04-30 DIAGNOSIS — I10 Essential (primary) hypertension: Secondary | ICD-10-CM

## 2016-04-30 DIAGNOSIS — Z0000 Encounter for general adult medical examination without abnormal findings: Secondary | ICD-10-CM

## 2016-04-30 DIAGNOSIS — Z1239 Encounter for other screening for malignant neoplasm of breast: Secondary | ICD-10-CM | POA: Diagnosis not present

## 2016-04-30 DIAGNOSIS — E7800 Pure hypercholesterolemia, unspecified: Secondary | ICD-10-CM | POA: Diagnosis not present

## 2016-04-30 DIAGNOSIS — Z1231 Encounter for screening mammogram for malignant neoplasm of breast: Secondary | ICD-10-CM

## 2016-04-30 DIAGNOSIS — K3184 Gastroparesis: Secondary | ICD-10-CM

## 2016-04-30 LAB — HM DIABETES FOOT EXAM

## 2016-04-30 NOTE — Progress Notes (Signed)
Patient ID: Debra Kirk, female   DOB: [birth year], 82 y.o.   MRN: [id number]   Subjective:    Patient ID: Debra Kirk, female    DOB: Sep 28, 1929, 82 y.o.   MRN: [id number]  HPI  Patient with past history of hypercholesterolemia, diabetes and hypertension.  She comes in today to follow up on these issues as well as for a complete physical exam.  Sees cardiology.  Had echo 03/2015 - EF >55% with mild tricuspid and mitral regurgitations.  Lexi scan sestamibi 04/06/15 - no ischemia.  Just reevaluated 01/2016.  No changes made.  She feels better.  Doing well.  Tries to stay active.  No chest pain.  Breathing stable.  Feels doing well on zoloft.  Eating and drinking well.  Sugars doing better.  AM sugars averaging 140-150 and PM sugars 150-160.  Up to date with eye checks.     Past Medical History:  Diagnosis Date  . Carotid artery disease ([place])   . Compression fracture    s/p fosamax, prolia  . Diabetes mellitus ([place])   . Femoral neuropathy    left, elevated CRP, ESR, negative FANA and ANCA  . Fibrocystic breast disease   . Hypercholesterolemia   . Hypertension    Past Surgical History:  Procedure Laterality Date  . APPENDECTOMY    . CHOLECYSTECTOMY    . HERNIA REPAIR    . INGUINAL HERNIA REPAIR     left x 1, right x 2  . OVARIAN CYST REMOVAL    . PARTIAL HYSTERECTOMY     Family History  Problem Relation Age of Onset  . Heart disease Father     myocardial infarction  . Heart disease Mother     myocardial infarction  . Diabetes Mother   . Congestive Heart Failure Mother   . Bone cancer Sister   . Diabetes Sister     x2  . Heart disease Sister     CABG  . Rheum arthritis Sister   . Breast cancer Neg Hx   . Colon cancer Neg Hx    Social History   Social History  . Marital status: Widowed    Spouse name: N/A  . Number of children: 3  . Years of education: N/A   Social History Main Topics  . Smoking status: Former Smoker    Quit date: 08/20/1958  . Smokeless  tobacco: Never Used  . Alcohol use No  . Drug use: No  . Sexual activity: No   Other Topics Concern  . None   Social History Narrative  . None    Outpatient Encounter Prescriptions as of 04/30/2016  Medication Sig  . amLODipine (NORVASC) 5 MG tablet TAKE 1 TABLET BY MOUTH DAILY  . aspirin 81 MG tablet Take 81 mg by mouth daily.  . Calcium Carbonate-Vitamin D (CALCIUM 600+D) 600-400 MG-UNIT per tablet Take 1 tablet by mouth 2 (two) times daily.  Marland Kitchen denosumab (PROLIA) 60 MG/ML SOLN injection Inject 60 mg into the skin every 6 (six) months. Administer in upper arm, thigh, or abdomen  . glimepiride (AMARYL) 2 MG tablet TAKE 1 TABLET BY MOUTH EVERY DAY WITH BREAKFAST  . glucose blood (BAYER CONTOUR TEST) test strip 1 each by Other route as needed for other (Check daily and as needed). Check sugar daily per orders.  ICD: E11.9 Diabetes mellutis  . hydrochlorothiazide (HYDRODIURIL) 25 MG tablet TAKE 1 TABLET BY MOUTH EVERY DAY  . meclizine (ANTIVERT) 12.5 MG tablet Take 1 tablet (12.5 mg  total) by mouth 2 (two) times daily as needed for dizziness.  . metoCLOPramide (REGLAN) 5 MG tablet Take 5 mg by mouth 2 (two) times daily.   . metoprolol (LOPRESSOR) 50 MG tablet TAKE 1 AND 1/2 TABLET BY MOUTH TWICE A DAY  . omeprazole (PRILOSEC) 20 MG capsule TAKE ONE CAPSULE BY MOUTH TWICE A DAY  . rosuvastatin (CRESTOR) 5 MG tablet TAKE 1 TABLET (5 MG TOTAL) BY MOUTH DAILY.  Marland Kitchen sertraline (ZOLOFT) 50 MG tablet Take 1 tablet (50 mg total) by mouth daily.   No facility-administered encounter medications on file as of 04/30/2016.     Review of Systems  Constitutional: Negative for appetite change and unexpected weight change.  HENT: Negative for congestion and sinus pressure.   Eyes: Negative for pain and visual disturbance.  Respiratory: Negative for cough, chest tightness and shortness of breath.   Cardiovascular: Negative for chest pain, palpitations and leg swelling.  Gastrointestinal: Negative for  abdominal pain, diarrhea, nausea and vomiting.  Genitourinary: Negative for difficulty urinating and dysuria.  Musculoskeletal: Negative for back pain and joint swelling.  Skin: Negative for color change and rash.  Neurological: Negative for dizziness and headaches.  Hematological: Negative for adenopathy. Does not bruise/bleed easily.  Psychiatric/Behavioral: Negative for agitation and dysphoric mood.       Objective:     Blood pressure rechecked by me:  130/78  Physical Exam  Constitutional: She is oriented to person, place, and time. She appears well-developed and well-nourished. No distress.  HENT:  Nose: Nose normal.  Mouth/Throat: Oropharynx is clear and moist.  Eyes: Right eye exhibits no discharge. Left eye exhibits no discharge. No scleral icterus.  Neck: Neck supple. No thyromegaly present.  Cardiovascular: Normal rate and regular rhythm.   Pulmonary/Chest: Breath sounds normal. No accessory muscle usage. No tachypnea. No respiratory distress. She has no decreased breath sounds. She has no wheezes. She has no rhonchi. Right breast exhibits no inverted nipple, no mass, no nipple discharge and no tenderness (no axillary adenopathy). Left breast exhibits no inverted nipple, no mass, no nipple discharge and no tenderness (no axilarry adenopathy).  Abdominal: Soft. Bowel sounds are normal. There is no tenderness.  Musculoskeletal: She exhibits no edema or tenderness.  Lymphadenopathy:    She has no cervical adenopathy.  Neurological: She is alert and oriented to person, place, and time.  Skin: Skin is warm. No rash noted. No erythema.  Psychiatric: She has a normal mood and affect. Her behavior is normal.    BP 126/68   Pulse 62   Temp (!) 96.8 F (36 C) (Oral)   Ht 5' 6"  (1.676 m)   Wt 153 lb 12.8 oz (69.8 kg)   SpO2 94%   BMI 24.82 kg/m  Wt Readings from Last 3 Encounters:  04/30/16 153 lb 12.8 oz (69.8 kg)  01/30/16 152 lb (68.9 kg)  01/09/16 154 lb (69.9 kg)      Lab Results  Component Value Date   WBC 5.1 04/25/2016   HGB 13.4 04/25/2016   HCT 39.5 04/25/2016   PLT 238.0 04/25/2016   GLUCOSE 186 (H) 04/25/2016   CHOL 181 04/25/2016   TRIG 138.0 04/25/2016   HDL 43.50 04/25/2016   LDLCALC 110 (H) 04/25/2016   ALT 11 04/25/2016   AST 14 04/25/2016   NA 133 (L) 04/25/2016   K 3.6 04/25/2016   CL 95 (L) 04/25/2016   CREATININE 0.88 04/25/2016   BUN 19 04/25/2016   CO2 32 04/25/2016   TSH 1.35  04/25/2016   HGBA1C 6.7 (H) 04/25/2016   MICROALBUR 4.9 (H) 04/25/2016    Mm Digital Screening Bilateral  Result Date: 05/18/2015 CLINICAL DATA:  Screening. EXAM: DIGITAL SCREENING BILATERAL MAMMOGRAM WITH CAD COMPARISON:  Previous exam(s). ACR Breast Density Category b: There are scattered areas of fibroglandular density. FINDINGS: There are no findings suspicious for malignancy. Images were processed with CAD. IMPRESSION: No mammographic evidence of malignancy. A result letter of this screening mammogram will be mailed directly to the patient. RECOMMENDATION: Screening mammogram in one year. (Code:SM-B-01Y) BI-RADS CATEGORY  1: Negative. Electronically Signed   By: Franki Cabot M.D.   On: 05/18/2015 11:42       Assessment & Plan:   Problem List Items Addressed This Visit    Diabetes mellitus ([place])    a1c just checked 6.7.  Overall doing well.  Feels better.  Low carb diet and exercise.  Follow met b and a1c.  Up to date with eye checks.        Relevant Orders   Hemoglobin E1Y   Basic metabolic panel   Gastroparesis    On reglan.  GI following.  Doing well.       Health care maintenance    Physical today 04/30/16.  Colonoscopy 02/2005.  Mammogram 05/18/15 - Birads I.  Scheduled for mammogram 05/23/16.        Hypercholesterolemia    On crestor.  Low cholesterol diet and exercise.  Follow lipid panel and liver function tests.  LDL just checked 110.        Relevant Orders   Lipid panel   Hepatic function panel   Hypertension     Blood pressure under good control.  Continue same medication regimen.  Follow pressures.  Follow metabolic panel.        Peripheral neuropathy ([facility])    Stable.  Follow.        Other Visit Diagnoses    Screening breast examination    -  Primary       Einar Pheasant, MD

## 2016-04-30 NOTE — Assessment & Plan Note (Addendum)
On crestor.  Low cholesterol diet and exercise.  Follow lipid panel and liver function tests.  LDL just checked 110.

## 2016-04-30 NOTE — Assessment & Plan Note (Addendum)
On reglan.  GI following.  Doing well.

## 2016-04-30 NOTE — Assessment & Plan Note (Signed)
Stable.  Follow.   

## 2016-04-30 NOTE — Progress Notes (Signed)
Pre visit review using our clinic review tool, if applicable. No additional management support is needed unless otherwise documented below in the visit note. 

## 2016-04-30 NOTE — Assessment & Plan Note (Signed)
a1c just checked 6.7.  Overall doing well.  Feels better.  Low carb diet and exercise.  Follow met b and a1c.  Up to date with eye checks.

## 2016-04-30 NOTE — Assessment & Plan Note (Addendum)
Blood pressure under good control.  Continue same medication regimen.  Follow pressures.  Follow metabolic panel.   

## 2016-04-30 NOTE — Assessment & Plan Note (Addendum)
Physical today 04/30/16.  Colonoscopy 02/2005.  Mammogram 05/18/15 - Birads I.  Scheduled for mammogram 05/23/16.

## 2016-05-04 ENCOUNTER — Telehealth: Payer: Self-pay | Admitting: *Deleted

## 2016-05-04 DIAGNOSIS — L989 Disorder of the skin and subcutaneous tissue, unspecified: Secondary | ICD-10-CM

## 2016-05-04 NOTE — Telephone Encounter (Signed)
Please advise 

## 2016-05-04 NOTE — Telephone Encounter (Signed)
Patient stated that she was to call the office with the dermatologist she wanted to see, she requested to have a referral to Dr Lucinda Dell wit [facility].

## 2016-05-05 ENCOUNTER — Other Ambulatory Visit: Payer: Self-pay | Admitting: Internal Medicine

## 2016-05-06 NOTE — Telephone Encounter (Signed)
Order placed for dermatology referral.  

## 2016-05-08 ENCOUNTER — Encounter: Payer: Self-pay | Admitting: Internal Medicine

## 2016-05-15 ENCOUNTER — Other Ambulatory Visit: Payer: Self-pay | Admitting: Internal Medicine

## 2016-05-23 ENCOUNTER — Ambulatory Visit
Admission: RE | Admit: 2016-05-23 | Discharge: 2016-05-23 | Disposition: A | Discharge status: Home / Self Care | Payer: Medicare Other | Source: Ambulatory Visit | Attending: Internal Medicine | Admitting: Internal Medicine

## 2016-05-23 DIAGNOSIS — Z1231 Encounter for screening mammogram for malignant neoplasm of breast: Secondary | ICD-10-CM | POA: Diagnosis not present

## 2016-05-30 DIAGNOSIS — M810 Age-related osteoporosis without current pathological fracture: Secondary | ICD-10-CM | POA: Diagnosis not present

## 2016-06-13 DIAGNOSIS — M810 Age-related osteoporosis without current pathological fracture: Secondary | ICD-10-CM | POA: Diagnosis not present

## 2016-06-18 ENCOUNTER — Other Ambulatory Visit: Payer: Self-pay

## 2016-06-18 MED ORDER — SERTRALINE HCL 50 MG PO TABS: 50.0000 mg | ORAL_TABLET | Freq: Every day | ORAL | 1 refills | Status: DC

## 2016-07-23 ENCOUNTER — Telehealth: Payer: Self-pay | Admitting: Internal Medicine

## 2016-07-23 NOTE — Telephone Encounter (Signed)
Pt declined to get the AWV. Thank you! °

## 2016-08-14 ENCOUNTER — Other Ambulatory Visit: Payer: Self-pay | Admitting: Internal Medicine

## 2016-08-22 DIAGNOSIS — L821 Other seborrheic keratosis: Secondary | ICD-10-CM | POA: Diagnosis not present

## 2016-08-22 DIAGNOSIS — C44722 Squamous cell carcinoma of skin of right lower limb, including hip: Secondary | ICD-10-CM | POA: Diagnosis not present

## 2016-08-22 DIAGNOSIS — D485 Neoplasm of uncertain behavior of skin: Secondary | ICD-10-CM | POA: Diagnosis not present

## 2016-08-29 ENCOUNTER — Other Ambulatory Visit (INDEPENDENT_AMBULATORY_CARE_PROVIDER_SITE_OTHER): Payer: Medicare Other

## 2016-08-29 DIAGNOSIS — E119 Type 2 diabetes mellitus without complications: Secondary | ICD-10-CM | POA: Diagnosis not present

## 2016-08-29 DIAGNOSIS — E7800 Pure hypercholesterolemia, unspecified: Secondary | ICD-10-CM

## 2016-08-29 LAB — HEPATIC FUNCTION PANEL
ALBUMIN: 4.1 g/dL (ref 3.5–5.2)
ALT: 11 U/L (ref 0–35)
AST: 15 U/L (ref 0–37)
Alkaline Phosphatase: 39 U/L (ref 39–117)
Bilirubin, Direct: 0.2 mg/dL (ref 0.0–0.3)
Total Bilirubin: 0.7 mg/dL (ref 0.2–1.2)
Total Protein: 7.1 g/dL (ref 6.0–8.3)

## 2016-08-29 LAB — LIPID PANEL
Cholesterol: 189 mg/dL (ref 0–200)
HDL: 50.9 mg/dL (ref >39.00)
LDL CALC: 115 mg/dL — AB (ref 0–99)
NONHDL: 138.2
Total CHOL/HDL Ratio: 4
Triglycerides: 118 mg/dL (ref 0.0–149.0)
VLDL: 23.6 mg/dL (ref 0.0–40.0)

## 2016-08-29 LAB — BASIC METABOLIC PANEL
BUN: 16 mg/dL (ref 6–23)
CO2: 30 mEq/L (ref 19–32)
Calcium: 9.3 mg/dL (ref 8.4–10.5)
Chloride: 96 mEq/L (ref 96–112)
Creatinine, Ser: 0.91 mg/dL (ref 0.40–1.20)
GFR: 62.47 mL/min (ref >60.00)
GLUCOSE: 187 mg/dL — AB (ref 70–99)
POTASSIUM: 3.6 meq/L (ref 3.5–5.1)
SODIUM: 134 meq/L — AB (ref 135–145)

## 2016-08-29 LAB — HEMOGLOBIN A1C: Hgb A1c MFr Bld: 7.3 % — ABNORMAL HIGH (ref 4.6–6.5)

## 2016-09-03 ENCOUNTER — Encounter: Payer: Self-pay | Admitting: Internal Medicine

## 2016-09-03 ENCOUNTER — Ambulatory Visit (INDEPENDENT_AMBULATORY_CARE_PROVIDER_SITE_OTHER): Payer: Medicare Other | Admitting: Internal Medicine

## 2016-09-03 DIAGNOSIS — E119 Type 2 diabetes mellitus without complications: Secondary | ICD-10-CM

## 2016-09-03 DIAGNOSIS — G6289 Other specified polyneuropathies: Secondary | ICD-10-CM | POA: Diagnosis not present

## 2016-09-03 DIAGNOSIS — I10 Essential (primary) hypertension: Secondary | ICD-10-CM

## 2016-09-03 DIAGNOSIS — E7800 Pure hypercholesterolemia, unspecified: Secondary | ICD-10-CM | POA: Diagnosis not present

## 2016-09-03 DIAGNOSIS — K3184 Gastroparesis: Secondary | ICD-10-CM

## 2016-09-03 NOTE — Assessment & Plan Note (Signed)
Blood pressure has been well controlled.  Slightly elevated on initial check.  Recheck improved.  Follow pressures.  Same medication regimen.  Follow metabolic panel.

## 2016-09-03 NOTE — Assessment & Plan Note (Signed)
Stable.  Follow.   

## 2016-09-03 NOTE — Progress Notes (Signed)
Pre visit review using our clinic review tool, if applicable. No additional management support is needed unless otherwise documented below in the visit note. 

## 2016-09-03 NOTE — Assessment & Plan Note (Signed)
LDL 115.  Low cholesterol diet and exercise.  Follow lipid panel.

## 2016-09-03 NOTE — Progress Notes (Signed)
Patient ID: Debra Kirk, female   DOB: 1930/02/26, 83 y.o.   MRN: [id number]   Subjective:    Patient ID: Debra Kirk, female    DOB: [birth year], 83 y.o.   MRN: [id number]  HPI  Patient here for a scheduled follow up.  Here to f/u regarding her diabetes, hypercholesterolemia and hypertension.  Followed by cardiology.  Had w/up 03/2015 as outlined in last note.  Symptoms stable.  No chest pain. No sob.  No acid reflux.  No abdominal pain or cramping.  Bowels stable.  Tries to stay active.  States her blood sugars are averaging 140-150 in the am.  Discussed labs.  Recent a1c 7.3.     Past Medical History:  Diagnosis Date  . Carotid artery disease ([place])   . Compression fracture    s/p fosamax, prolia  . Diabetes mellitus ([place])   . Femoral neuropathy    left, elevated CRP, ESR, negative FANA and ANCA  . Fibrocystic breast disease   . Hypercholesterolemia   . Hypertension    Past Surgical History:  Procedure Laterality Date  . APPENDECTOMY    . CHOLECYSTECTOMY    . HERNIA REPAIR    . INGUINAL HERNIA REPAIR     left x 1, right x 2  . OVARIAN CYST REMOVAL    . PARTIAL HYSTERECTOMY     Family History  Problem Relation Age of Onset  . Heart disease Father     myocardial infarction  . Heart disease Mother     myocardial infarction  . Diabetes Mother   . Congestive Heart Failure Mother   . Bone cancer Sister   . Diabetes Sister     x2  . Heart disease Sister     CABG  . Rheum arthritis Sister   . Breast cancer Neg Hx   . Colon cancer Neg Hx    Social History   Social History  . Marital status: Widowed    Spouse name: N/A  . Number of children: 3  . Years of education: N/A   Social History Main Topics  . Smoking status: Former Smoker    Quit date: 08/20/1958  . Smokeless tobacco: Never Used  . Alcohol use No  . Drug use: No  . Sexual activity: No   Other Topics Concern  . None   Social History Narrative  . None    Outpatient Encounter Prescriptions  as of 09/03/2016  Medication Sig  . amLODipine (NORVASC) 5 MG tablet TAKE 1 TABLET BY MOUTH DAILY  . aspirin 81 MG tablet Take 81 mg by mouth daily.  . Calcium Carbonate-Vitamin D (CALCIUM 600+D) 600-400 MG-UNIT per tablet Take 1 tablet by mouth 2 (two) times daily.  Marland Kitchen denosumab (PROLIA) 60 MG/ML SOLN injection Inject 60 mg into the skin every 6 (six) months. Administer in upper arm, thigh, or abdomen  . glimepiride (AMARYL) 2 MG tablet TAKE 1 TABLET BY MOUTH EVERY DAY WITH BREAKFAST  . glucose blood (BAYER CONTOUR TEST) test strip 1 each by Other route as needed for other (Check daily and as needed). Check sugar daily per orders.  ICD: E11.9 Diabetes mellutis  . hydrochlorothiazide (HYDRODIURIL) 25 MG tablet TAKE 1 TABLET BY MOUTH EVERY DAY  . meclizine (ANTIVERT) 12.5 MG tablet Take 1 tablet (12.5 mg total) by mouth 2 (two) times daily as needed for dizziness.  . metoCLOPramide (REGLAN) 5 MG tablet Take 5 mg by mouth 2 (two) times daily.   . metoprolol (LOPRESSOR) 50 MG tablet  TAKE 1 AND 1/2 TABLET BY MOUTH TWICE A DAY  . omeprazole (PRILOSEC) 20 MG capsule TAKE ONE CAPSULE BY MOUTH TWICE A DAY  . rosuvastatin (CRESTOR) 5 MG tablet TAKE 1 TABLET (5 MG TOTAL) BY MOUTH DAILY.  Marland Kitchen sertraline (ZOLOFT) 50 MG tablet Take 1 tablet (50 mg total) by mouth daily.   No facility-administered encounter medications on file as of 09/03/2016.     Review of Systems  Constitutional: Negative for appetite change and unexpected weight change.  HENT: Negative for congestion and sinus pressure.   Respiratory: Negative for cough, chest tightness and shortness of breath.   Cardiovascular: Negative for chest pain, palpitations and leg swelling.  Gastrointestinal: Negative for abdominal pain, diarrhea, nausea and vomiting.  Genitourinary: Negative for difficulty urinating and dysuria.  Musculoskeletal: Negative for back pain and joint swelling.  Skin: Negative for color change and rash.  Neurological: Negative for  dizziness, light-headedness and headaches.  Psychiatric/Behavioral: Negative for agitation and dysphoric mood.       Objective:     Blood pressure rechecked by me:  132/64  Physical Exam  Constitutional: She appears well-developed and well-nourished. No distress.  HENT:  Nose: Nose normal.  Mouth/Throat: Oropharynx is clear and moist.  Neck: Neck supple. No thyromegaly present.  Cardiovascular: Normal rate and regular rhythm.   Pulmonary/Chest: Breath sounds normal. No respiratory distress. She has no wheezes.  Abdominal: Soft. Bowel sounds are normal. There is no tenderness.  Musculoskeletal: She exhibits no edema or tenderness.  Lymphadenopathy:    She has no cervical adenopathy.  Skin: No rash noted. No erythema.  Psychiatric: She has a normal mood and affect. Her behavior is normal.    BP 132/64   Pulse (!) 59   Temp 97.6 F (36.4 C) (Oral)   Ht 5' 6"  (1.676 m)   Wt 155 lb 12.8 oz (70.7 kg)   SpO2 95%   BMI 25.15 kg/m  Wt Readings from Last 3 Encounters:  09/03/16 155 lb 12.8 oz (70.7 kg)  04/30/16 153 lb 12.8 oz (69.8 kg)  01/30/16 152 lb (68.9 kg)     Lab Results  Component Value Date   WBC 5.1 04/25/2016   HGB 13.4 04/25/2016   HCT 39.5 04/25/2016   PLT 238.0 04/25/2016   GLUCOSE 187 (H) 08/29/2016   CHOL 189 08/29/2016   TRIG 118.0 08/29/2016   HDL 50.90 08/29/2016   LDLCALC 115 (H) 08/29/2016   ALT 11 08/29/2016   AST 15 08/29/2016   NA 134 (L) 08/29/2016   K 3.6 08/29/2016   CL 96 08/29/2016   CREATININE 0.91 08/29/2016   BUN 16 08/29/2016   CO2 30 08/29/2016   TSH 1.35 04/25/2016   HGBA1C 7.3 (H) 08/29/2016   MICROALBUR 4.9 (H) 04/25/2016    Mm Screening Breast Tomo Bilateral  Result Date: 05/23/2016 CLINICAL DATA:  Screening. EXAM: 2D DIGITAL SCREENING BILATERAL MAMMOGRAM WITH CAD AND ADJUNCT TOMO COMPARISON:  Previous exam(s). ACR Breast Density Category b: There are scattered areas of fibroglandular density. FINDINGS: There are no  findings suspicious for malignancy. Images were processed with CAD. IMPRESSION: No mammographic evidence of malignancy. A result letter of this screening mammogram will be mailed directly to the patient. RECOMMENDATION: Screening mammogram in one year. (Code:SM-B-01Y) BI-RADS CATEGORY  1: Negative. Electronically Signed   By: Lillia Mountain M.D.   On: 05/23/2016 10:54       Assessment & Plan:   Problem List Items Addressed This Visit    Diabetes mellitus ([place])  Low carb diet and exercise.  Follow met b and a1c.  Discussed labs.  a1c increased from last check - 7.3.  Follow.  Hold on making changes.        Relevant Orders   Hemoglobin A1c   Gastroparesis    Followed by GI.        Hypercholesterolemia    LDL 115.  Low cholesterol diet and exercise.  Follow lipid panel.        Relevant Orders   Hepatic function panel   Lipid panel   Hypertension    Blood pressure has been well controlled.  Slightly elevated on initial check.  Recheck improved.  Follow pressures.  Same medication regimen.  Follow metabolic panel.        Relevant Orders   Basic metabolic panel   Peripheral neuropathy ([facility])    Stable.  Follow.            Einar Pheasant, MD

## 2016-09-03 NOTE — Assessment & Plan Note (Signed)
Followed by GI

## 2016-09-03 NOTE — Assessment & Plan Note (Signed)
Low carb diet and exercise.  Follow met b and a1c.  Discussed labs.  a1c increased from last check - 7.3.  Follow.  Hold on making changes.

## 2016-09-04 ENCOUNTER — Ambulatory Visit: Payer: Medicare Other

## 2016-09-20 DIAGNOSIS — L905 Scar conditions and fibrosis of skin: Secondary | ICD-10-CM | POA: Diagnosis not present

## 2016-09-20 DIAGNOSIS — C44722 Squamous cell carcinoma of skin of right lower limb, including hip: Secondary | ICD-10-CM | POA: Diagnosis not present

## 2016-10-02 ENCOUNTER — Other Ambulatory Visit: Payer: Self-pay | Admitting: Internal Medicine

## 2016-10-13 ENCOUNTER — Other Ambulatory Visit: Payer: Self-pay | Admitting: Internal Medicine

## 2016-10-23 DIAGNOSIS — K3184 Gastroparesis: Secondary | ICD-10-CM | POA: Diagnosis not present

## 2016-10-23 DIAGNOSIS — E1143 Type 2 diabetes mellitus with diabetic autonomic (poly)neuropathy: Secondary | ICD-10-CM | POA: Diagnosis not present

## 2016-10-23 DIAGNOSIS — K219 Gastro-esophageal reflux disease without esophagitis: Secondary | ICD-10-CM | POA: Diagnosis not present

## 2016-11-01 ENCOUNTER — Other Ambulatory Visit: Payer: Self-pay | Admitting: Internal Medicine

## 2016-11-10 ENCOUNTER — Other Ambulatory Visit: Payer: Self-pay | Admitting: Internal Medicine

## 2016-11-12 ENCOUNTER — Other Ambulatory Visit: Payer: Self-pay | Admitting: Internal Medicine

## 2016-12-12 DIAGNOSIS — M8000XD Age-related osteoporosis with current pathological fracture, unspecified site, subsequent encounter for fracture with routine healing: Secondary | ICD-10-CM | POA: Diagnosis not present

## 2016-12-19 DIAGNOSIS — M810 Age-related osteoporosis without current pathological fracture: Secondary | ICD-10-CM | POA: Diagnosis not present

## 2016-12-22 ENCOUNTER — Other Ambulatory Visit: Payer: Self-pay | Admitting: Internal Medicine

## 2016-12-26 DIAGNOSIS — D225 Melanocytic nevi of trunk: Secondary | ICD-10-CM | POA: Diagnosis not present

## 2016-12-26 DIAGNOSIS — Z85828 Personal history of other malignant neoplasm of skin: Secondary | ICD-10-CM | POA: Diagnosis not present

## 2016-12-26 DIAGNOSIS — D2261 Melanocytic nevi of right upper limb, including shoulder: Secondary | ICD-10-CM | POA: Diagnosis not present

## 2016-12-26 DIAGNOSIS — D2272 Melanocytic nevi of left lower limb, including hip: Secondary | ICD-10-CM | POA: Diagnosis not present

## 2016-12-26 DIAGNOSIS — X32XXXA Exposure to sunlight, initial encounter: Secondary | ICD-10-CM | POA: Diagnosis not present

## 2016-12-26 DIAGNOSIS — L570 Actinic keratosis: Secondary | ICD-10-CM | POA: Diagnosis not present

## 2017-01-02 ENCOUNTER — Other Ambulatory Visit: Payer: Medicare Other

## 2017-01-07 ENCOUNTER — Ambulatory Visit: Payer: Medicare Other | Admitting: Internal Medicine

## 2017-01-09 ENCOUNTER — Other Ambulatory Visit (INDEPENDENT_AMBULATORY_CARE_PROVIDER_SITE_OTHER): Payer: Medicare Other

## 2017-01-09 DIAGNOSIS — I10 Essential (primary) hypertension: Secondary | ICD-10-CM | POA: Diagnosis not present

## 2017-01-09 DIAGNOSIS — E7800 Pure hypercholesterolemia, unspecified: Secondary | ICD-10-CM | POA: Diagnosis not present

## 2017-01-09 DIAGNOSIS — E119 Type 2 diabetes mellitus without complications: Secondary | ICD-10-CM

## 2017-01-09 LAB — BASIC METABOLIC PANEL
BUN: 13 mg/dL (ref 6–23)
CHLORIDE: 92 meq/L — AB (ref 96–112)
CO2: 29 meq/L (ref 19–32)
CREATININE: 1.01 mg/dL (ref 0.40–1.20)
Calcium: 9.6 mg/dL (ref 8.4–10.5)
GFR: 55.34 mL/min — AB (ref >60.00)
Glucose, Bld: 218 mg/dL — ABNORMAL HIGH (ref 70–99)
Potassium: 3.3 mEq/L — ABNORMAL LOW (ref 3.5–5.1)
Sodium: 132 mEq/L — ABNORMAL LOW (ref 135–145)

## 2017-01-09 LAB — HEPATIC FUNCTION PANEL
ALBUMIN: 4.2 g/dL (ref 3.5–5.2)
ALT: 13 U/L (ref 0–35)
AST: 17 U/L (ref 0–37)
Alkaline Phosphatase: 43 U/L (ref 39–117)
Bilirubin, Direct: 0.2 mg/dL (ref 0.0–0.3)
TOTAL PROTEIN: 7.6 g/dL (ref 6.0–8.3)
Total Bilirubin: 0.7 mg/dL (ref 0.2–1.2)

## 2017-01-09 LAB — LIPID PANEL
Cholesterol: 174 mg/dL (ref 0–200)
HDL: 41.9 mg/dL (ref >39.00)
LDL Cholesterol: 105 mg/dL — ABNORMAL HIGH (ref 0–99)
NonHDL: 132.02
TRIGLYCERIDES: 136 mg/dL (ref 0.0–149.0)
Total CHOL/HDL Ratio: 4
VLDL: 27.2 mg/dL (ref 0.0–40.0)

## 2017-01-09 LAB — HEMOGLOBIN A1C: Hgb A1c MFr Bld: 8 % — ABNORMAL HIGH (ref 4.6–6.5)

## 2017-01-11 ENCOUNTER — Telehealth: Payer: Self-pay | Admitting: Internal Medicine

## 2017-01-11 DIAGNOSIS — J22 Unspecified acute lower respiratory infection: Secondary | ICD-10-CM | POA: Diagnosis not present

## 2017-01-11 NOTE — Telephone Encounter (Signed)
Noted. Thanks.

## 2017-01-11 NOTE — Telephone Encounter (Signed)
Reason for call: weakness, nausea  Symptoms: fever 101 pm last night, weak , nausea, able to eat a little something yesterday, not really sure food intake and fluids, still able to urinate , just so weak can't go per patient ,  Duration 1 week off and on  No appointments available  Wanted daughter Lynelle Smoke  to call for her , advised Tammy daughter to take patient immediately to urgent care for evaluation she agreed will take to [facility] for work up, will keep [place] posted

## 2017-01-11 NOTE — Telephone Encounter (Signed)
Pt daughter Lynelle Smoke called and stated that pt is not feeling well she is c/o nausea, fever and weakness. Pt did have a cold last week. Please advise, thank you!  Call Tammy @ [phone]

## 2017-01-11 NOTE — Telephone Encounter (Signed)
Please call patient, thanks

## 2017-01-15 ENCOUNTER — Ambulatory Visit (INDEPENDENT_AMBULATORY_CARE_PROVIDER_SITE_OTHER): Payer: Medicare Other | Admitting: Internal Medicine

## 2017-01-15 ENCOUNTER — Inpatient Hospital Stay: Payer: Medicare Other

## 2017-01-15 ENCOUNTER — Emergency Department: Payer: Medicare Other

## 2017-01-15 ENCOUNTER — Encounter: Payer: Self-pay | Admitting: Internal Medicine

## 2017-01-15 ENCOUNTER — Encounter: Payer: Self-pay | Admitting: Emergency Medicine

## 2017-01-15 ENCOUNTER — Inpatient Hospital Stay
Admission: EM | Admit: 2017-01-15 | Discharge: 2017-01-19 | DRG: 871 | Disposition: A | Discharge status: Home Health | Payer: Medicare Other | Attending: Specialist | Admitting: Specialist

## 2017-01-15 DIAGNOSIS — E1151 Type 2 diabetes mellitus with diabetic peripheral angiopathy without gangrene: Secondary | ICD-10-CM | POA: Diagnosis present

## 2017-01-15 DIAGNOSIS — J9601 Acute respiratory failure with hypoxia: Secondary | ICD-10-CM | POA: Diagnosis present

## 2017-01-15 DIAGNOSIS — I10 Essential (primary) hypertension: Secondary | ICD-10-CM | POA: Diagnosis present

## 2017-01-15 DIAGNOSIS — R059 Cough, unspecified: Secondary | ICD-10-CM | POA: Insufficient documentation

## 2017-01-15 DIAGNOSIS — I6529 Occlusion and stenosis of unspecified carotid artery: Secondary | ICD-10-CM | POA: Diagnosis present

## 2017-01-15 DIAGNOSIS — Z7982 Long term (current) use of aspirin: Secondary | ICD-10-CM

## 2017-01-15 DIAGNOSIS — D72829 Elevated white blood cell count, unspecified: Secondary | ICD-10-CM

## 2017-01-15 DIAGNOSIS — R609 Edema, unspecified: Secondary | ICD-10-CM

## 2017-01-15 DIAGNOSIS — M7120 Synovial cyst of popliteal space [Baker], unspecified knee: Secondary | ICD-10-CM | POA: Diagnosis present

## 2017-01-15 DIAGNOSIS — R0603 Acute respiratory distress: Secondary | ICD-10-CM | POA: Diagnosis not present

## 2017-01-15 DIAGNOSIS — R0602 Shortness of breath: Secondary | ICD-10-CM | POA: Diagnosis not present

## 2017-01-15 DIAGNOSIS — K5730 Diverticulosis of large intestine without perforation or abscess without bleeding: Secondary | ICD-10-CM | POA: Diagnosis not present

## 2017-01-15 DIAGNOSIS — E876 Hypokalemia: Secondary | ICD-10-CM | POA: Diagnosis present

## 2017-01-15 DIAGNOSIS — R531 Weakness: Secondary | ICD-10-CM | POA: Diagnosis present

## 2017-01-15 DIAGNOSIS — K219 Gastro-esophageal reflux disease without esophagitis: Secondary | ICD-10-CM | POA: Diagnosis present

## 2017-01-15 DIAGNOSIS — E871 Hypo-osmolality and hyponatremia: Secondary | ICD-10-CM | POA: Diagnosis not present

## 2017-01-15 DIAGNOSIS — R748 Abnormal levels of other serum enzymes: Secondary | ICD-10-CM | POA: Diagnosis not present

## 2017-01-15 DIAGNOSIS — J811 Chronic pulmonary edema: Secondary | ICD-10-CM | POA: Diagnosis present

## 2017-01-15 DIAGNOSIS — Z87891 Personal history of nicotine dependence: Secondary | ICD-10-CM

## 2017-01-15 DIAGNOSIS — M7989 Other specified soft tissue disorders: Secondary | ICD-10-CM | POA: Diagnosis present

## 2017-01-15 DIAGNOSIS — J189 Pneumonia, unspecified organism: Secondary | ICD-10-CM | POA: Diagnosis present

## 2017-01-15 DIAGNOSIS — Z79899 Other long term (current) drug therapy: Secondary | ICD-10-CM

## 2017-01-15 DIAGNOSIS — F329 Major depressive disorder, single episode, unspecified: Secondary | ICD-10-CM | POA: Diagnosis present

## 2017-01-15 DIAGNOSIS — E1140 Type 2 diabetes mellitus with diabetic neuropathy, unspecified: Secondary | ICD-10-CM | POA: Diagnosis present

## 2017-01-15 DIAGNOSIS — R05 Cough: Secondary | ICD-10-CM | POA: Diagnosis not present

## 2017-01-15 DIAGNOSIS — Z880 Allergy status to penicillin: Secondary | ICD-10-CM

## 2017-01-15 DIAGNOSIS — E785 Hyperlipidemia, unspecified: Secondary | ICD-10-CM | POA: Diagnosis present

## 2017-01-15 DIAGNOSIS — R0902 Hypoxemia: Secondary | ICD-10-CM

## 2017-01-15 DIAGNOSIS — N6019 Diffuse cystic mastopathy of unspecified breast: Secondary | ICD-10-CM | POA: Diagnosis present

## 2017-01-15 DIAGNOSIS — M79605 Pain in left leg: Secondary | ICD-10-CM | POA: Diagnosis not present

## 2017-01-15 DIAGNOSIS — T502X5A Adverse effect of carbonic-anhydrase inhibitors, benzothiadiazides and other diuretics, initial encounter: Secondary | ICD-10-CM | POA: Diagnosis present

## 2017-01-15 DIAGNOSIS — A419 Sepsis, unspecified organism: Principal | ICD-10-CM | POA: Diagnosis present

## 2017-01-15 DIAGNOSIS — J069 Acute upper respiratory infection, unspecified: Secondary | ICD-10-CM | POA: Insufficient documentation

## 2017-01-15 DIAGNOSIS — I70202 Unspecified atherosclerosis of native arteries of extremities, left leg: Secondary | ICD-10-CM | POA: Diagnosis present

## 2017-01-15 DIAGNOSIS — J988 Other specified respiratory disorders: Secondary | ICD-10-CM | POA: Diagnosis not present

## 2017-01-15 DIAGNOSIS — R000 Tachycardia, unspecified: Secondary | ICD-10-CM | POA: Diagnosis present

## 2017-01-15 DIAGNOSIS — E119 Type 2 diabetes mellitus without complications: Secondary | ICD-10-CM | POA: Diagnosis not present

## 2017-01-15 DIAGNOSIS — R0600 Dyspnea, unspecified: Secondary | ICD-10-CM | POA: Diagnosis not present

## 2017-01-15 DIAGNOSIS — I214 Non-ST elevation (NSTEMI) myocardial infarction: Secondary | ICD-10-CM | POA: Diagnosis not present

## 2017-01-15 DIAGNOSIS — I2510 Atherosclerotic heart disease of native coronary artery without angina pectoris: Secondary | ICD-10-CM | POA: Diagnosis present

## 2017-01-15 DIAGNOSIS — I248 Other forms of acute ischemic heart disease: Secondary | ICD-10-CM | POA: Diagnosis present

## 2017-01-15 DIAGNOSIS — Z7984 Long term (current) use of oral hypoglycemic drugs: Secondary | ICD-10-CM

## 2017-01-15 DIAGNOSIS — R590 Localized enlarged lymph nodes: Secondary | ICD-10-CM | POA: Diagnosis not present

## 2017-01-15 DIAGNOSIS — J471 Bronchiectasis with (acute) exacerbation: Secondary | ICD-10-CM | POA: Diagnosis not present

## 2017-01-15 DIAGNOSIS — R600 Localized edema: Secondary | ICD-10-CM | POA: Diagnosis not present

## 2017-01-15 DIAGNOSIS — I739 Peripheral vascular disease, unspecified: Secondary | ICD-10-CM

## 2017-01-15 LAB — CBC WITH DIFFERENTIAL/PLATELET
BASOS ABS: 0 10*3/uL (ref 0–0.1)
BASOS PCT: 0 %
Eosinophils Absolute: 0 10*3/uL (ref 0–0.7)
Eosinophils Relative: 0 %
HEMATOCRIT: 33.7 % — AB (ref 35.0–47.0)
HEMOGLOBIN: 11.6 g/dL — AB (ref 12.0–16.0)
Lymphocytes Relative: 7 %
Lymphs Abs: 1.2 10*3/uL (ref 1.0–3.6)
MCH: 28.5 pg (ref 26.0–34.0)
MCHC: 34.6 g/dL (ref 32.0–36.0)
MCV: 82.5 fL (ref 80.0–100.0)
Monocytes Absolute: 1.6 10*3/uL — ABNORMAL HIGH (ref 0.2–0.9)
Monocytes Relative: 10 %
NEUTROS ABS: 13.6 10*3/uL — AB (ref 1.4–6.5)
NEUTROS PCT: 83 %
Platelets: 296 10*3/uL (ref 150–440)
RBC: 4.08 MIL/uL (ref 3.80–5.20)
RDW: 14.2 % (ref 11.5–14.5)
WBC: 16.4 10*3/uL — AB (ref 3.6–11.0)

## 2017-01-15 LAB — COMPREHENSIVE METABOLIC PANEL
ALBUMIN: 3.2 g/dL — AB (ref 3.5–5.0)
ALK PHOS: 75 U/L (ref 38–126)
ALT: 14 U/L (ref 14–54)
AST: 24 U/L (ref 15–41)
Anion gap: 12 (ref 5–15)
BILIRUBIN TOTAL: 1 mg/dL (ref 0.3–1.2)
BUN: 18 mg/dL (ref 6–20)
CO2: 26 mmol/L (ref 22–32)
Calcium: 8.5 mg/dL — ABNORMAL LOW (ref 8.9–10.3)
Chloride: 86 mmol/L — ABNORMAL LOW (ref 101–111)
Creatinine, Ser: 0.95 mg/dL (ref 0.44–1.00)
GFR calc Af Amer: >60 mL/min (ref >60)
GFR calc non Af Amer: 53 mL/min — ABNORMAL LOW (ref >60)
GLUCOSE: 271 mg/dL — AB (ref 65–99)
Potassium: 2.7 mmol/L — CL (ref 3.5–5.1)
SODIUM: 124 mmol/L — AB (ref 135–145)
TOTAL PROTEIN: 7.4 g/dL (ref 6.5–8.1)

## 2017-01-15 LAB — TROPONIN I: Troponin I: 0.03 ng/mL (ref <0.03)

## 2017-01-15 LAB — GLUCOSE, CAPILLARY
Glucose-Capillary: 107 mg/dL — ABNORMAL HIGH (ref 65–99)
Glucose-Capillary: 112 mg/dL — ABNORMAL HIGH (ref 65–99)
Glucose-Capillary: 150 mg/dL — ABNORMAL HIGH (ref 65–99)
Glucose-Capillary: 153 mg/dL — ABNORMAL HIGH (ref 65–99)

## 2017-01-15 LAB — MAGNESIUM: MAGNESIUM: 1.7 mg/dL (ref 1.7–2.4)

## 2017-01-15 MED ORDER — POTASSIUM CHLORIDE CRYS ER 20 MEQ PO TBCR: 40.0000 meq | EXTENDED_RELEASE_TABLET | ORAL | Status: DC

## 2017-01-15 MED ORDER — ENOXAPARIN SODIUM 40 MG/0.4ML ~~LOC~~ SOLN
40.0000 mg | SUBCUTANEOUS | Status: DC
  Administered 2017-01-15 – 2017-01-18 (×4): 40 mg via SUBCUTANEOUS
  Filled 2017-01-15 (×4): qty 0.4

## 2017-01-15 MED ORDER — GLIMEPIRIDE 2 MG PO TABS
2.0000 mg | ORAL_TABLET | Freq: Every day | ORAL | Status: DC
  Administered 2017-01-16 – 2017-01-19 (×3): 2 mg via ORAL
  Filled 2017-01-15 (×3): qty 1

## 2017-01-15 MED ORDER — ONDANSETRON HCL 4 MG/2ML IJ SOLN: 4.0000 mg | Freq: Four times a day (QID) | INTRAMUSCULAR | Status: DC | PRN

## 2017-01-15 MED ORDER — LEVOFLOXACIN IN D5W 750 MG/150ML IV SOLN
750.0000 mg | INTRAVENOUS | Status: DC
  Filled 2017-01-15: qty 150

## 2017-01-15 MED ORDER — ACETAMINOPHEN 650 MG RE SUPP: 650.0000 mg | Freq: Four times a day (QID) | RECTAL | Status: DC | PRN

## 2017-01-15 MED ORDER — POTASSIUM CHLORIDE IN NACL 40-0.9 MEQ/L-% IV SOLN
INTRAVENOUS | Status: DC
  Administered 2017-01-15: 75 mL/h via INTRAVENOUS
  Filled 2017-01-15 (×3): qty 1000

## 2017-01-15 MED ORDER — MAGNESIUM SULFATE 2 GM/50ML IV SOLN
2.0000 g | Freq: Once | INTRAVENOUS | Status: AC
  Administered 2017-01-15: 2 g via INTRAVENOUS
  Filled 2017-01-15: qty 50

## 2017-01-15 MED ORDER — CALCIUM CARBONATE-VITAMIN D 500-200 MG-UNIT PO TABS
1.0000 | ORAL_TABLET | Freq: Two times a day (BID) | ORAL | Status: DC
  Administered 2017-01-16 – 2017-01-19 (×7): 1 via ORAL
  Filled 2017-01-15 (×7): qty 1

## 2017-01-15 MED ORDER — ROSUVASTATIN CALCIUM 10 MG PO TABS
5.0000 mg | ORAL_TABLET | Freq: Every day | ORAL | Status: DC
  Administered 2017-01-15 – 2017-01-19 (×5): 5 mg via ORAL
  Filled 2017-01-15 (×5): qty 1

## 2017-01-15 MED ORDER — INSULIN ASPART 100 UNIT/ML ~~LOC~~ SOLN
0.0000 [IU] | Freq: Three times a day (TID) | SUBCUTANEOUS | Status: DC
  Administered 2017-01-15: 1 [IU] via SUBCUTANEOUS
  Administered 2017-01-16: 3 [IU] via SUBCUTANEOUS
  Administered 2017-01-16: 1 [IU] via SUBCUTANEOUS
  Administered 2017-01-16: 2 [IU] via SUBCUTANEOUS
  Administered 2017-01-17: 17:00:00 1 [IU] via SUBCUTANEOUS
  Administered 2017-01-17: 2 [IU] via SUBCUTANEOUS
  Administered 2017-01-17: 13:00:00 3 [IU] via SUBCUTANEOUS
  Administered 2017-01-18: 7 [IU] via SUBCUTANEOUS
  Administered 2017-01-18: 1 [IU] via SUBCUTANEOUS
  Administered 2017-01-18: 2 [IU] via SUBCUTANEOUS
  Administered 2017-01-19: 08:00:00 3 [IU] via SUBCUTANEOUS
  Administered 2017-01-19: 5 [IU] via SUBCUTANEOUS
  Filled 2017-01-15 (×2): qty 1
  Filled 2017-01-15: qty 3
  Filled 2017-01-15 (×2): qty 1
  Filled 2017-01-15: qty 5
  Filled 2017-01-15: qty 3
  Filled 2017-01-15: qty 2
  Filled 2017-01-15: qty 5
  Filled 2017-01-15: qty 2
  Filled 2017-01-15: qty 7
  Filled 2017-01-15 (×2): qty 2
  Filled 2017-01-15: qty 3

## 2017-01-15 MED ORDER — POTASSIUM CHLORIDE CRYS ER 20 MEQ PO TBCR
40.0000 meq | EXTENDED_RELEASE_TABLET | Freq: Once | ORAL | Status: AC
  Administered 2017-01-15: 40 meq via ORAL
  Filled 2017-01-15: qty 2

## 2017-01-15 MED ORDER — SODIUM CHLORIDE 0.9 % IV BOLUS (SEPSIS)
1000.0000 mL | Freq: Once | INTRAVENOUS | Status: AC
  Administered 2017-01-15: 1000 mL via INTRAVENOUS

## 2017-01-15 MED ORDER — AMLODIPINE BESYLATE 5 MG PO TABS
5.0000 mg | ORAL_TABLET | Freq: Every day | ORAL | Status: DC
  Administered 2017-01-16: 5 mg via ORAL
  Filled 2017-01-15: qty 1

## 2017-01-15 MED ORDER — ALBUTEROL SULFATE (2.5 MG/3ML) 0.083% IN NEBU
2.5000 mg | INHALATION_SOLUTION | Freq: Four times a day (QID) | RESPIRATORY_TRACT | Status: DC | PRN
  Administered 2017-01-15: 2.5 mg via RESPIRATORY_TRACT
  Filled 2017-01-15: qty 3

## 2017-01-15 MED ORDER — FUROSEMIDE 10 MG/ML IJ SOLN
INTRAMUSCULAR | Status: AC
  Administered 2017-01-16: 20 mg
  Filled 2017-01-15: qty 2

## 2017-01-15 MED ORDER — PANTOPRAZOLE SODIUM 40 MG PO TBEC
40.0000 mg | DELAYED_RELEASE_TABLET | Freq: Every day | ORAL | Status: DC
  Administered 2017-01-15 – 2017-01-19 (×5): 40 mg via ORAL
  Filled 2017-01-15 (×5): qty 1

## 2017-01-15 MED ORDER — METOCLOPRAMIDE HCL 10 MG PO TABS
5.0000 mg | ORAL_TABLET | Freq: Two times a day (BID) | ORAL | Status: DC
  Administered 2017-01-15 – 2017-01-19 (×8): 5 mg via ORAL
  Filled 2017-01-15 (×8): qty 1

## 2017-01-15 MED ORDER — SERTRALINE HCL 50 MG PO TABS
50.0000 mg | ORAL_TABLET | Freq: Every day | ORAL | Status: DC
  Administered 2017-01-17 – 2017-01-18 (×2): 50 mg via ORAL
  Filled 2017-01-15 (×4): qty 1

## 2017-01-15 MED ORDER — ASPIRIN EC 81 MG PO TBEC
81.0000 mg | DELAYED_RELEASE_TABLET | Freq: Every day | ORAL | Status: DC
  Administered 2017-01-16 – 2017-01-19 (×4): 81 mg via ORAL
  Filled 2017-01-15 (×4): qty 1

## 2017-01-15 MED ORDER — METOPROLOL TARTRATE 50 MG PO TABS
75.0000 mg | ORAL_TABLET | Freq: Two times a day (BID) | ORAL | Status: DC
  Administered 2017-01-15 – 2017-01-19 (×8): 75 mg via ORAL
  Filled 2017-01-15 (×8): qty 1

## 2017-01-15 MED ORDER — POTASSIUM CHLORIDE 10 MEQ/100ML IV SOLN
10.0000 meq | INTRAVENOUS | Status: AC
  Administered 2017-01-15 (×4): 10 meq via INTRAVENOUS
  Filled 2017-01-15 (×4): qty 100

## 2017-01-15 MED ORDER — ACETAMINOPHEN 325 MG PO TABS: 650.0000 mg | ORAL_TABLET | Freq: Four times a day (QID) | ORAL | Status: DC | PRN

## 2017-01-15 MED ORDER — LEVOFLOXACIN IN D5W 750 MG/150ML IV SOLN
750.0000 mg | Freq: Once | INTRAVENOUS | Status: AC
  Administered 2017-01-15: 750 mg via INTRAVENOUS
  Filled 2017-01-15: qty 150

## 2017-01-15 MED ORDER — MECLIZINE HCL 25 MG PO TABS: 12.5000 mg | ORAL_TABLET | Freq: Two times a day (BID) | ORAL | Status: DC | PRN

## 2017-01-15 MED ORDER — ONDANSETRON HCL 4 MG PO TABS: 4.0000 mg | ORAL_TABLET | Freq: Four times a day (QID) | ORAL | Status: DC | PRN

## 2017-01-15 MED ORDER — TRAMADOL HCL 50 MG PO TABS
50.0000 mg | ORAL_TABLET | Freq: Four times a day (QID) | ORAL | Status: DC | PRN
Start: 2017-01-15 — End: 2017-01-19

## 2017-01-15 MED ORDER — IOPAMIDOL (ISOVUE-300) INJECTION 61%
75.0000 mL | Freq: Once | INTRAVENOUS | Status: AC | PRN
  Administered 2017-01-15: 75 mL via INTRAVENOUS

## 2017-01-15 NOTE — ED Triage Notes (Signed)
Pt sent here by PCP for eval, oxygen sat 90% on RA, currently being treated for pneumonia but sx not improving.

## 2017-01-15 NOTE — H&P (Signed)
[facility] NAME: Debra Kirk    MR#:  [id number]  DATE OF BIRTH:  01-29-1930  DATE OF ADMISSION:  01/15/2017  PRIMARY CARE PHYSICIAN: Einar Pheasant, MD   REQUESTING/REFERRING PHYSICIAN: dr Vicente Males  CHIEF COMPLAINT:   Cough, congestion, fever and sweats at home for one week HISTORY OF PRESENT ILLNESS:  Debra Kirk  is a 83 y.o. female with a known history of Hypertension, hypercholesterolemia, coronary artery disease comes to the emergency room accompanied by her daughter with cough fever or congestion for 1 week. She was found to be hypoxic and shortness of breath for last 1 week. CT of the chest was done which shows bilateral pneumonia or right more than left. Patient is being admitted with sepsis secondary to pneumonia. She presented with fever up to 100.8, tachypnea, tachycardia heart rate in the 90s elevated white count of 16,000 and positive chest CT  PAST MEDICAL HISTORY:   Past Medical History:  Diagnosis Date  . Carotid artery disease ([place])   . Compression fracture    s/p fosamax, prolia  . Diabetes mellitus ([place])   . Femoral neuropathy    left, elevated CRP, ESR, negative FANA and ANCA  . Fibrocystic breast disease   . Hypercholesterolemia   . Hypertension     PAST SURGICAL HISTOIRY:   Past Surgical History:  Procedure Laterality Date  . APPENDECTOMY    . CHOLECYSTECTOMY    . HERNIA REPAIR    . INGUINAL HERNIA REPAIR     left x 1, right x 2  . OVARIAN CYST REMOVAL    . PARTIAL HYSTERECTOMY      SOCIAL HISTORY:   Social History  Substance Use Topics  . Smoking status: Former Smoker    Quit date: 08/20/1958  . Smokeless tobacco: Never Used  . Alcohol use No    FAMILY HISTORY:   Family History  Problem Relation Age of Onset  . Heart disease Father        myocardial infarction  . Heart disease Mother        myocardial infarction  . Diabetes Mother   . Congestive Heart Failure Mother    . Bone cancer Sister   . Diabetes Sister        x2  . Heart disease Sister        CABG  . Rheum arthritis Sister   . Breast cancer Neg Hx   . Colon cancer Neg Hx     DRUG ALLERGIES:   Allergies  Allergen Reactions  . Penicillins Rash    .pcn    REVIEW OF SYSTEMS:  Review of Systems  Constitutional: Negative for chills, fever and weight loss.  HENT: Negative for ear discharge, ear pain and nosebleeds.   Eyes: Negative for blurred vision, pain and discharge.  Respiratory: Positive for cough, sputum production and shortness of breath. Negative for wheezing and stridor.   Cardiovascular: Positive for orthopnea. Negative for chest pain, palpitations and PND.  Gastrointestinal: Negative for abdominal pain, diarrhea, nausea and vomiting.  Genitourinary: Negative for frequency and urgency.  Musculoskeletal: Negative for back pain and joint pain.  Neurological: Positive for weakness. Negative for sensory change, speech change and focal weakness.  Psychiatric/Behavioral: Negative for depression and hallucinations. The patient is not nervous/anxious.      MEDICATIONS AT HOME:   Prior to Admission medications   Medication Sig Start Date End Date Taking? Authorizing Provider  amLODipine (NORVASC) 5 MG tablet TAKE 1  TABLET BY MOUTH DAILY 11/01/16  Yes Einar Pheasant, MD  aspirin 81 MG tablet Take 81 mg by mouth daily.   Yes [provider]  Calcium Carbonate-Vitamin D (CALCIUM 600+D) 600-400 MG-UNIT per tablet Take 1 tablet by mouth 2 (two) times daily.   Yes [provider]  glimepiride (AMARYL) 2 MG tablet TAKE 1 TABLET BY MOUTH EVERY DAY WITH BREAKFAST 08/14/16  Yes Einar Pheasant, MD  hydrochlorothiazide (HYDRODIURIL) 25 MG tablet TAKE 1 TABLET BY MOUTH EVERY DAY 10/15/16  Yes Einar Pheasant, MD  metoCLOPramide (REGLAN) 5 MG tablet Take 5 mg by mouth 2 (two) times daily.    Yes [provider]  metoprolol (LOPRESSOR) 50 MG tablet TAKE 1 AND 1/2 TABLET BY  MOUTH TWICE A DAY 11/12/16  Yes Einar Pheasant, MD  omeprazole (PRILOSEC) 20 MG capsule TAKE ONE CAPSULE BY MOUTH TWICE A DAY 11/14/15  Yes Einar Pheasant, MD  rosuvastatin (CRESTOR) 5 MG tablet TAKE 1 TABLET (5 MG TOTAL) BY MOUTH DAILY. 11/12/16  Yes Einar Pheasant, MD  sertraline (ZOLOFT) 50 MG tablet TAKE 1 TABLET (50 MG TOTAL) BY MOUTH DAILY. 12/24/16  Yes Einar Pheasant, MD  BAYER CONTOUR TEST test strip CHECK DAILY AND AS NEEDED **E11.9** 10/03/16   Einar Pheasant, MD  denosumab (PROLIA) 60 MG/ML SOLN injection Inject 60 mg into the skin every 6 (six) months. Administer in upper arm, thigh, or abdomen    [provider]  meclizine (ANTIVERT) 12.5 MG tablet Take 1 tablet (12.5 mg total) by mouth 2 (two) times daily as needed for dizziness. 01/30/16   Einar Pheasant, MD      VITAL SIGNS:  Blood pressure (!) 131/54, pulse 90, temperature 98.5 F (36.9 C), temperature source Oral, resp. rate (!) 33, height 5' 6" (1.676 m), weight 70.3 kg (155 lb), SpO2 95 %.  PHYSICAL EXAMINATION:  GENERAL:  83 y.o.-year-old patient lying in the bed with no acute distress.  EYES: Pupils equal, round, reactive to light and accommodation. No scleral icterus. Extraocular muscles intact.  HEENT: Head atraumatic, normocephalic. Oropharynx and nasopharynx clear.  NECK:  Supple, no jugular venous distention. No thyroid enlargement, no tenderness.  LUNGS: Normal breath sounds bilaterally, no wheezing, rales,rhonchi or crepitation. No use of accessory muscles of respiration.  CARDIOVASCULAR: S1, S2 normal. No murmurs, rubs, or gallops.  ABDOMEN: Soft, nontender, nondistended. Bowel sounds present. No organomegaly or mass.  EXTREMITIES: No pedal edema, cyanosis, or clubbing.  NEUROLOGIC: Cranial nerves II through XII are intact. Muscle strength 5/5 in all extremities. Sensation intact. Gait not checked.  PSYCHIATRIC: The patient is alert and oriented x 3.  SKIN: No obvious rash, lesion, or ulcer.    LABORATORY PANEL:   CBC  Recent Labs Lab 01/15/17 1231  WBC 16.4*  HGB 11.6*  HCT 33.7*  PLT 296   ------------------------------------------------------------------------------------------------------------------  Chemistries   Recent Labs Lab 01/15/17 1231  NA 124*  K 2.7*  CL 86*  CO2 26  GLUCOSE 271*  BUN 18  CREATININE 0.95  CALCIUM 8.5*  AST 24  ALT 14  ALKPHOS 75  BILITOT 1.0   ------------------------------------------------------------------------------------------------------------------  Cardiac Enzymes  Recent Labs Lab 01/15/17 1231  TROPONINI 0.03*   ------------------------------------------------------------------------------------------------------------------  RADIOLOGY:  Dg Chest 2 View  Result Date: 01/15/2017 CLINICAL DATA:  Shortness of breath. EXAM: CHEST  2 VIEW COMPARISON:  03/23/2015 FINDINGS: There is asymmetric reticular density at the right base. Fullness of the right hilum which could reflect underlying adenopathy or mass. Background hyperinflation, suspected emphysema. Borderline cardiomegaly.  Remote L1 compression fracture. IMPRESSION: Infection and/or atelectasis at the right base. Prominent right hilum which could reflect underlying mass or adenopathy. Recommend chest CT follow-up. Electronically Signed   By: Monte Fantasia M.D.   On: 01/15/2017 12:59   Ct Chest W Contrast  Result Date: 01/15/2017 CLINICAL DATA:  Worsening shortness of breath over the last couple weeks, oxygen desaturation, possible pneumonia, abnormal chest radiograph EXAM: CT CHEST WITH CONTRAST TECHNIQUE: Multidetector CT imaging of the chest was performed during intravenous contrast administration. Sagittal and coronal MPR images reconstructed from axial data set. CONTRAST:  57m ISOVUE-300 IOPAMIDOL (ISOVUE-300) INJECTION 61% IV COMPARISON:  06/04/2011 Radiographic correlation: Chest radiographs 01/15/2017 and 03/23/2015 FINDINGS: Cardiovascular:  Atherosclerotic calcifications aorta, coronary arteries and proximal great vessels. Upper normal caliber ascending thoracic aorta 3.7 cm diameter. No pericardial effusion. Mediastinum/Nodes: Multiple mildly enlarged mediastinal and RIGHT hilar nodes. RIGHT superior hilar node 18 mm short axis image 76. Enlarged subcarinal node 13 mm short axis image 74. Enlarged precarinal lymph nodes 15 mm short axis image 69 and 11 mm short axis image 62. Numerous additional normal to mildly enlarged RIGHT paratracheal and superior mediastinal nodes. No axillary adenopathy. Base of cervical region unremarkable. Esophagus normal appearance. Lungs/Pleura: Central peribronchial thickening. RIGHT lower lobe infiltrate consistent with pneumonia. Mild tree-in-bud infiltrates in the RIGHT upper lobe and scattered in LEFT lung. Subsegmental atelectasis LEFT lower lobe. No pleural effusion or pneumothorax. No discrete pulmonary mass/nodule. Upper Abdomen: No definite focal upper abdominal abnormalities. Musculoskeletal: Probable vertebral hemangioma at T8 and potential additional vertebral hemangioma at T4 extending into RIGHT pedicle, unchanged. Superior endplate compression fracture of L1 with 25% anterior height loss, unchanged since 03/23/2015. IMPRESSION: Central peribronchial thickening with scattered infiltrates in both lungs greatest in RIGHT lower lobe consistent with pneumonia. RIGHT hilar and mediastinal adenopathy, potentially reactive in the setting of acute infiltrate; recommend follow-up CT chest with contrast in 3-4 months following resolution of the patient's acute process to exclude other causes of adenopathy including tumor and lymphoma. Aortic atherosclerosis and coronary artery calcification. Electronically Signed   By: MLavonia DanaM.D.   On: 01/15/2017 14:56    EKG:    IMPRESSION AND PLAN:   JLadaysha Soutar is a 83y.o. female with a known history of Hypertension, hypercholesterolemia, coronary artery disease  comes to the emergency room accompanied by her daughter with cough fever or congestion for 1 week. She was found to be hypoxic and shortness of breath for last 1 week. CT of the chest was done which shows bilateral pneumonia.  1. Sepsis secondary to pneumonia right more than left as noted on CT chest -Patient presented with 1 week of shortness of breath cough congestion fever of 100.8, tachycardia, tachypnea, elevated white count_0 -IV Levaquin -Follow blood culture and white count  2. Acute hypoxia secondary to pneumonia -Sats 96% on 2 L. Wean as tolerated. -Patient reports no history of COPD or asthma  3. Hypokalemia, hyponatremia due to poor by mouth intake secondary to #1 -IV and oral replacement of potassium -Pharmacy consult placed. Check magnesium and replete if needed  4. CAD -Resume cardiac meds  5. Hyperlipidemia on Crestor  6. DVT prophylaxis subcutaneous Lovenox    All the records are reviewed and case discussed with ED provider. Management plans discussed with the patient, family and they are in agreement.  CODE STATUS: Full this was discussed with patient and daughter  TOTAL TIME TAKING CARE OF THIS PATIENT: 50 minutes.    Mariusz Jubb M.D on 01/15/2017 at  3:44 PM  Between 7am to 6pm - Pager - [phone]  After 6pm go to [email or web address] - password [email or web address] [facility]  [facility]  Office  [phone]  CC: Primary care physician; Einar Pheasant, MD

## 2017-01-15 NOTE — Assessment & Plan Note (Signed)
Orthostatic on exam.   Treat infection.  To ER for further evaluation and treatment.

## 2017-01-15 NOTE — Progress Notes (Signed)
When RN helped pt back to bed from the using the bedside commode, pt stated "I can not breath." pt.'s breathing was very labored, bilateral crackles were heard in her lungs, pt was persuasively sweating.  PRN albuterol was given to pt at 2254, CBG was checked 112. RN took pt.'s oxygen stats 81 % on 2 L, RN increased oxygen level to 4L, pt.'s O2 went to 89%. RN increased O2 level to 6 L, pt.'s O2 went to 90%.. A non-rebreather was placed on pt, 02 stats went to 97. Rapid response and prime doctor was notified of pt.'s condition. When respiratory arrived they placed pt on BIPAP. Prime doctor placed orders for continuous BIPAP, stat chest xray, and transfer pt to ICU bed 5. Pt. And pt.'s belongings were sent to ICU 5. Felipa Emory, pt.'s daughter, was notified of pt.'s condition and pt is now in ICU bed 5.   Olney Monier [employer]

## 2017-01-15 NOTE — Assessment & Plan Note (Signed)
Recent cardiac w/up negative.  Presents today with increased cough, congestion, weakness and sob.  Wheezing on exam.  Recently diagnosed with pneumonia.  No cxr obtained at acute care.  See above.  Given worsening symptoms, orthostasis and weakness - to ER for evaluation.  ER notified.

## 2017-01-15 NOTE — Progress Notes (Signed)
Family Meeting Note  Advance Directive:yes  Today a meeting took place with the dter and pt  The following clinical team members were present during this meeting:  -pt , dter and MD  The following were discussed:Patient's diagnosis: Patient is diagnosed with pneumonia and sepsis. , Patient's progosis: Is stable. CODE STATUS addressed. Patient's daughter and patient want her to be full code. Daughter is the power of attorney.  Time spent during discussion: 16 minutes Hovanes Hymas, MD

## 2017-01-15 NOTE — Assessment & Plan Note (Signed)
Increased cough and congestion.  Evaluated recently.  Diagnosed with pneumonia.  Placed on azithromycin.  Pulse ox 90%.  Weak. Orthostatic on exam.  Decreased appetite.  Referred to ER as outlined.

## 2017-01-15 NOTE — ED Triage Notes (Signed)
Pt c/o SHOB that has been worsening over last couple weeks.  Pt was told by her doctor last week had PNA and gave her 1 gm azithromycin for one dose. Pt saw Dr. Nicki Reaper today and had in office sats 88%. Received breathing treatment there.  They told her come here for evaluation of possible PNA and for CXR.  Pt mildly labored in triage with tachynpnea. SHOB on exertion.  Denies pain.  No fever.  sats in low 90s here

## 2017-01-15 NOTE — Progress Notes (Signed)
Patient ID: Debra Kirk, female   DOB: 11/22/1929, 83 y.o.   MRN: [id number]   Subjective:    Patient ID: Debra Kirk, female    DOB: [birth year], 83 y.o.   MRN: [id number]  HPI  Patient here for a scheduled follow up, but is acutely ill.  She states that starting approximately 10 days ago, she started having sore throat.  This progressed to increased congestion and cough.  Fever.   Decreased appetite.  Increased nausea.  Not eating.  Is drinking some water.  No diarrhea.  No abdominal pain.  Very weak.  Not able to stand and walk around secondary to weakness.  Increased wheezing and sob.  Increased congestion.  Was seen at acute care several days ago.  Placed on azithromycin.  No better.     Past Medical History:  Diagnosis Date  . Carotid artery disease ([place])   . Compression fracture    s/p fosamax, prolia  . Diabetes mellitus ([place])   . Femoral neuropathy    left, elevated CRP, ESR, negative FANA and ANCA  . Fibrocystic breast disease   . Hypercholesterolemia   . Hypertension    Past Surgical History:  Procedure Laterality Date  . APPENDECTOMY    . CHOLECYSTECTOMY    . HERNIA REPAIR    . INGUINAL HERNIA REPAIR     left x 1, right x 2  . OVARIAN CYST REMOVAL    . PARTIAL HYSTERECTOMY     Family History  Problem Relation Age of Onset  . Heart disease Father        myocardial infarction  . Heart disease Mother        myocardial infarction  . Diabetes Mother   . Congestive Heart Failure Mother   . Bone cancer Sister   . Diabetes Sister        x2  . Heart disease Sister        CABG  . Rheum arthritis Sister   . Breast cancer Neg Hx   . Colon cancer Neg Hx    Social History   Social History  . Marital status: Widowed    Spouse name: N/A  . Number of children: 3  . Years of education: N/A   Social History Main Topics  . Smoking status: Former Smoker    Quit date: 08/20/1958  . Smokeless tobacco: Never Used  . Alcohol use No  . Drug use: No  . Sexual  activity: No   Other Topics Concern  . None   Social History Narrative  . None    No facility-administered encounter medications on file as of 01/15/2017.    Outpatient Encounter Prescriptions as of 01/15/2017  Medication Sig  . amLODipine (NORVASC) 5 MG tablet TAKE 1 TABLET BY MOUTH DAILY  . aspirin 81 MG tablet Take 81 mg by mouth daily.  Marland Kitchen BAYER CONTOUR TEST test strip CHECK DAILY AND AS NEEDED **E11.9**  . Calcium Carbonate-Vitamin D (CALCIUM 600+D) 600-400 MG-UNIT per tablet Take 1 tablet by mouth 2 (two) times daily.  Marland Kitchen denosumab (PROLIA) 60 MG/ML SOLN injection Inject 60 mg into the skin every 6 (six) months. Administer in upper arm, thigh, or abdomen  . glimepiride (AMARYL) 2 MG tablet TAKE 1 TABLET BY MOUTH EVERY DAY WITH BREAKFAST  . hydrochlorothiazide (HYDRODIURIL) 25 MG tablet TAKE 1 TABLET BY MOUTH EVERY DAY  . meclizine (ANTIVERT) 12.5 MG tablet Take 1 tablet (12.5 mg total) by mouth 2 (two) times daily as needed for dizziness.  Marland Kitchen  metoCLOPramide (REGLAN) 5 MG tablet Take 5 mg by mouth 2 (two) times daily.   . metoprolol (LOPRESSOR) 50 MG tablet TAKE 1 AND 1/2 TABLET BY MOUTH TWICE A DAY  . omeprazole (PRILOSEC) 20 MG capsule TAKE ONE CAPSULE BY MOUTH TWICE A DAY  . rosuvastatin (CRESTOR) 5 MG tablet TAKE 1 TABLET (5 MG TOTAL) BY MOUTH DAILY.  Marland Kitchen sertraline (ZOLOFT) 50 MG tablet TAKE 1 TABLET (50 MG TOTAL) BY MOUTH DAILY.    Review of Systems  Constitutional: Positive for appetite change and fatigue.  HENT: Positive for congestion and postnasal drip.   Respiratory: Positive for cough, shortness of breath and wheezing.   Cardiovascular: Negative for palpitations and leg swelling.  Gastrointestinal: Positive for nausea. Negative for abdominal pain, diarrhea and vomiting.  Genitourinary: Negative for difficulty urinating and dysuria.  Musculoskeletal: Negative for joint swelling.  Skin: Negative for color change and rash.  Neurological: Negative for dizziness and  headaches.  Psychiatric/Behavioral: Negative for agitation and dysphoric mood.       Objective:     Blood pressure standing;  122/70 and lying 144/78  Physical Exam  Constitutional: She appears well-developed and well-nourished.  Appears not to feel well.    HENT:  Nose: Nose normal.  Mouth/Throat: Oropharynx is clear and moist.  Neck: Neck supple. No thyromegaly present.  Cardiovascular: Normal rate and regular rhythm.   Pulmonary/Chest:  Increased congestion and wheezing.  Increased cough with expiration.    Abdominal: Soft. Bowel sounds are normal. There is no tenderness.  Musculoskeletal: She exhibits no edema or tenderness.  Lymphadenopathy:    She has no cervical adenopathy.  Skin: No rash noted. No erythema.  Psychiatric: She has a normal mood and affect. Her behavior is normal.    BP (!) 160/74 (BP Location: Left Arm, Patient Position: Sitting, Cuff Size: Normal)   Pulse 75   Temp (!) 100.4 F (38 C) (Oral)   Resp 16   Ht 5' 6"  (1.676 m)   Wt 155 lb (70.3 kg)   SpO2 90%   BMI 25.02 kg/m  Wt Readings from Last 3 Encounters:  01/15/17 155 lb (70.3 kg)  01/15/17 155 lb (70.3 kg)  09/03/16 155 lb 12.8 oz (70.7 kg)     Lab Results  Component Value Date   WBC 16.4 (H) 01/15/2017   HGB 11.6 (L) 01/15/2017   HCT 33.7 (L) 01/15/2017   PLT 296 01/15/2017   GLUCOSE 271 (H) 01/15/2017   CHOL 174 01/09/2017   TRIG 136.0 01/09/2017   HDL 41.90 01/09/2017   LDLCALC 105 (H) 01/09/2017   ALT 14 01/15/2017   AST 24 01/15/2017   NA 124 (L) 01/15/2017   K 2.7 (LL) 01/15/2017   CL 86 (L) 01/15/2017   CREATININE 0.95 01/15/2017   BUN 18 01/15/2017   CO2 26 01/15/2017   TSH 1.35 04/25/2016   HGBA1C 8.0 (H) 01/09/2017   MICROALBUR 4.9 (H) 04/25/2016       Assessment & Plan:   Problem List Items Addressed This Visit    Cough    Increased cough and congestion.  Evaluated recently.  Diagnosed with pneumonia.  Placed on azithromycin.  Pulse ox 90%.  Weak.  Orthostatic on exam.  Decreased appetite.  Referred to ER as outlined.        Diabetes mellitus ([place])    Sugars recently elevated.  a1c 8.0.  Will need to treat acute infection.  Will need f/u for her sugars.  Check and record sugars and bring  to f/u appt.        Hypertension    Orthostatic on exam.   Treat infection.  To ER for further evaluation and treatment.       Respiratory infection    Persistent cough, congestion, wheezing and weakness.  Was seen at acute care several days ago.  States was diagnosed with pneumonia.  No cxr.  Was placed on azithromycin.  Comes in today and is feeling no better. Is weak.  Not eating.  Nausea.  Orthostatic on exam.  Discussed with her and her daughter and great granddaughter.  Feel she warrants further evaluation and treatment in ER/hospital.  They are in agreement.  ER notified.        SOB (shortness of breath)    Recent cardiac w/up negative.  Presents today with increased cough, congestion, weakness and sob.  Wheezing on exam.  Recently diagnosed with pneumonia.  No cxr obtained at acute care.  See above.  Given worsening symptoms, orthostasis and weakness - to ER for evaluation.  ER notified.           I spent 45 minutes with the patient and more than 50% of the time was spent in consultation regarding the above.  Time spent obtaining history, discussing current symptoms and examining pt.  Also discussed treatment plan and plans for evaluation in ER.    Einar Pheasant, MD

## 2017-01-15 NOTE — ED Provider Notes (Addendum)
[facility] Emergency Department Provider Note  Time seen: 1:51 PM  I have reviewed the triage vital signs and the nursing notes.   HISTORY  Chief Complaint Cough and Shortness of Breath    HPI Debra Kirk is a 83 y.o. female with a past medical history of diabetes, hypertension, hyperlipidemia, presents to the emergency department for cough, congestion and shortness of breath. According to the patient for the past one week she has been coughing and feeling very short of breath. Patient went to her doctor and was referred to the emergency department for evaluation. Upon arrival the patient has a room air saturation of 92%, no home O2 requirement at baseline. Patient is mildly tachypnea with a frequent cough. Patient states intermittent central chest discomfort although denies any currently. Denies abdominal pain. States she has been nauseated with intermittent vomiting but denies diarrhea. States subjective fevers at home but has not measured a temperature.  Past Medical History:  Diagnosis Date  . Carotid artery disease ([place])   . Compression fracture    s/p fosamax, prolia  . Diabetes mellitus ([place])   . Femoral neuropathy    left, elevated CRP, ESR, negative FANA and ANCA  . Fibrocystic breast disease   . Hypercholesterolemia   . Hypertension     Patient Active Problem List   Diagnosis Date Noted  . Cough 01/15/2017  . Respiratory infection 01/15/2017  . Dysuria 01/31/2016  . Dizziness 01/31/2016  . Epistaxis, recurrent 08/08/2015  . Palpitations 05/01/2015  . SOB (shortness of breath) 03/23/2015  . Health care maintenance 11/07/2014  . Peripheral neuropathy 03/03/2014  . Gastroparesis 08/26/2012  . Diabetes mellitus ([place]) 08/26/2012  . Hypertension 08/26/2012  . Hypercholesterolemia 08/26/2012    Past Surgical History:  Procedure Laterality Date  . APPENDECTOMY    . CHOLECYSTECTOMY    . HERNIA REPAIR    . INGUINAL HERNIA REPAIR     left x 1, right x 2  . OVARIAN CYST REMOVAL    . PARTIAL HYSTERECTOMY      Prior to Admission medications   Medication Sig Start Date End Date Taking? Authorizing Provider  amLODipine (NORVASC) 5 MG tablet TAKE 1 TABLET BY MOUTH DAILY 11/01/16  Yes Einar Pheasant, MD  aspirin 81 MG tablet Take 81 mg by mouth daily.   Yes [provider]  Calcium Carbonate-Vitamin D (CALCIUM 600+D) 600-400 MG-UNIT per tablet Take 1 tablet by mouth 2 (two) times daily.   Yes [provider]  glimepiride (AMARYL) 2 MG tablet TAKE 1 TABLET BY MOUTH EVERY DAY WITH BREAKFAST 08/14/16  Yes Einar Pheasant, MD  hydrochlorothiazide (HYDRODIURIL) 25 MG tablet TAKE 1 TABLET BY MOUTH EVERY DAY 10/15/16  Yes Einar Pheasant, MD  metoCLOPramide (REGLAN) 5 MG tablet Take 5 mg by mouth 2 (two) times daily.    Yes [provider]  metoprolol (LOPRESSOR) 50 MG tablet TAKE 1 AND 1/2 TABLET BY MOUTH TWICE A DAY 11/12/16  Yes Einar Pheasant, MD  omeprazole (PRILOSEC) 20 MG capsule TAKE ONE CAPSULE BY MOUTH TWICE A DAY 11/14/15  Yes Einar Pheasant, MD  rosuvastatin (CRESTOR) 5 MG tablet TAKE 1 TABLET (5 MG TOTAL) BY MOUTH DAILY. 11/12/16  Yes Einar Pheasant, MD  sertraline (ZOLOFT) 50 MG tablet TAKE 1 TABLET (50 MG TOTAL) BY MOUTH DAILY. 12/24/16  Yes Einar Pheasant, MD  BAYER CONTOUR TEST test strip CHECK DAILY AND AS NEEDED **E11.9** 10/03/16   Einar Pheasant, MD  denosumab (PROLIA) 60 MG/ML SOLN injection Inject 60  mg into the skin every 6 (six) months. Administer in upper arm, thigh, or abdomen    [provider]  meclizine (ANTIVERT) 12.5 MG tablet Take 1 tablet (12.5 mg total) by mouth 2 (two) times daily as needed for dizziness. 01/30/16   Einar Pheasant, MD    Allergies  Allergen Reactions  . Penicillins Rash    .pcn    Family History  Problem Relation Age of Onset  . Heart disease Father        myocardial infarction  . Heart disease Mother        myocardial infarction  .  Diabetes Mother   . Congestive Heart Failure Mother   . Bone cancer Sister   . Diabetes Sister        x2  . Heart disease Sister        CABG  . Rheum arthritis Sister   . Breast cancer Neg Hx   . Colon cancer Neg Hx     Social History Social History  Substance Use Topics  . Smoking status: Former Smoker    Quit date: 08/20/1958  . Smokeless tobacco: Never Used  . Alcohol use No    Review of Systems Constitutional: Subjective fever at home. Cardiovascular: Intermittent central chest discomfort Respiratory: Positive for shortness of breath and cough Gastrointestinal: Negative for abdominal pain. Intermittent nausea and vomiting. Negative for diarrhea. Genitourinary: Negative for dysuria. Musculoskeletal: Negative for back pain. Skin: Negative for rash. Neurological: Negative for headache All other ROS negative  ____________________________________________   PHYSICAL EXAM:  VITAL SIGNS: ED Triage Vitals  Enc Vitals Group     BP 01/15/17 1223 (!) 125/54     Pulse Rate 01/15/17 1223 84     Resp 01/15/17 1223 (!) 24     Temp 01/15/17 1223 98.5 F (36.9 C)     Temp Source 01/15/17 1223 Oral     SpO2 01/15/17 1223 92 %     Weight 01/15/17 1224 155 lb (70.3 kg)     Height 01/15/17 1224 5' 6"  (1.676 m)     Head Circumference --      Peak Flow --      Pain Score --      Pain Loc --      Pain Edu? --      Excl. in [place]? --     Constitutional: Alert and oriented. Well appearing and in no distress. Eyes: Normal exam ENT   Head: Normocephalic and atraumatic.   Mouth/Throat: Mucous membranes are moist. Cardiovascular: Normal rate, regular rhythm. No murmur Respiratory: Mild tachypnea. No retractions. Patient has rhonchi especially in the right lung fields. Gastrointestinal: Soft and nontender. No distention.  Musculoskeletal: Nontender with normal range of motion in all extremities. No lower extremity tenderness or edema. Neurologic:  Normal speech and language. No  gross focal neurologic deficits  Skin:  Skin is warm, dry and intact.  Psychiatric: Mood and affect are normal.   ____________________________________________    EKG  EKG reviewed and interpreted by myself shows normal sinus rhythm at 82 bpm, narrow QRS, normal axis, prolonged QTC of 591 ms, nonspecific ST changes without ST elevation.  ____________________________________________    RADIOLOGY  IMPRESSION: Infection and/or atelectasis at the right base. Prominent right hilum which could reflect underlying mass or adenopathy. Recommend chest CT follow-up.  ____________________________________________   INITIAL IMPRESSION / ASSESSMENT AND PLAN / ED COURSE  Pertinent labs & imaging results that were available during my care of the patient were reviewed by me  and considered in my medical decision making (see chart for details).  Patient presents to the emergency department with 1 week of cough, shortness of breath and intermittent chest discomfort and congestion. Patient has elevated white blood cell count of 16,000, hyponatremic at 124 and hypokalemia at 2.7. Patient's chest x-ray shows right lower lobe pneumonia. We'll obtain a CT scan in the emergency department. We will start IV antibiotics, send blood cultures. We'll admit to the hospital and CT has resulted.  CT consistent with multifocal pneumonia. Given the patient's hyponatremia, leukocytosis and multifocal pneumonia patient will be admitted to the hospital for further treatment.  Patient satting 88% on room air with a good waveform. We will place the patient on 2 L of oxygen via nasal cannula and admitted to the hospital for further treatment.  ____________________________________________   FINAL CLINICAL IMPRESSION(S) / ED DIAGNOSES  Right lower lobe pneumonia. Hyponatremia Hypokalemia   Harvest Dark, MD 01/15/17 1506    Harvest Dark, MD 01/15/17 [phone]

## 2017-01-15 NOTE — Progress Notes (Signed)
Pre-visit discussion using our clinic review tool. No additional management support is needed unless otherwise documented below in the visit note.  

## 2017-01-15 NOTE — Progress Notes (Signed)
Pharmacy Antibiotic Note  Debra Kirk is a 83 y.o. female admitted on 01/15/2017 with pneumonia.  Pharmacy has been consulted for levofloxacin dosing. Patient received levofloxacin 750 mg IV x 1 in ED  Plan: Begin levofloxacin 750 mg IV q 48 hours based on renal function  Height: 5\' 6"  (167.6 cm) Weight: 155 lb (70.3 kg) IBW/kg (Calculated) : 59.3  Temp (24hrs), Avg:99.5 F (37.5 C), Min:98.5 F (36.9 C), Max:100.4 F (38 C)   Recent Labs Lab 01/09/17 0903 01/15/17 1231  WBC  --  16.4*  CREATININE 1.01 0.95    Estimated Creatinine Clearance: 40.5 mL/min (by C-G formula based on SCr of 0.95 mg/dL).    Allergies  Allergen Reactions  . Penicillins Rash    .pcn    Antimicrobials this admission: levofloxacin 5/29 >>   Dose adjustments this admission:  Microbiology results: 5/29 BCx: sent  Thank you for allowing pharmacy to be a part of this patient's care.  Darrow Bussing, PharmD Pharmacy Resident 01/15/2017 4:39 PM

## 2017-01-15 NOTE — Progress Notes (Signed)
[facility] for electrolytes Indication: hypokalemia  Assessment: 5/29 1230 K = 2.7 Mg = 1.7 Pt received KCl 40 mEq PO x 1 Pt also has KCl 40 mEq in fluids (75 mL/hr)  Goal of Therapy:  Electrolytes WNL  Plan:  Will give magnesium 2 g IV x 1 and KCl 10 mEq IV x 4  Will check K one hour after last dose   Allergies  Allergen Reactions  . Penicillins Rash    .pcn    Patient Measurements: Height: 5' 6"  (167.6 cm) Weight: 155 lb (70.3 kg) IBW/kg (Calculated) : 59.3  Vital Signs: Temp: 98 F (36.7 C) (05/29 1652) Temp Source: Oral (05/29 1652) BP: 164/81 (05/29 1652) Pulse Rate: 82 (05/29 1652) Intake/Output from previous day: No intake/output data recorded. Intake/Output from this shift: No intake/output data recorded.  Labs:  Recent Labs  01/15/17 1231  WBC 16.4*  HGB 11.6*  HCT 33.7*  PLT 296  CREATININE 0.95  MG 1.7  ALBUMIN 3.2*  PROT 7.4  AST 24  ALT 14  ALKPHOS 75  BILITOT 1.0   Estimated Creatinine Clearance: 40.5 mL/min (by C-G formula based on SCr of 0.95 mg/dL).   Microbiology: No results found for this or any previous visit (from the past 720 hour(s)).  Medical History: Past Medical History:  Diagnosis Date  . Carotid artery disease ([place])   . Compression fracture    s/p fosamax, prolia  . Diabetes mellitus ([place])   . Femoral neuropathy    left, elevated CRP, ESR, negative FANA and ANCA  . Fibrocystic breast disease   . Hypercholesterolemia   . Hypertension     Medications:  Scheduled:  . [START ON 01/16/2017] amLODipine  5 mg Oral Daily  . [START ON 01/16/2017] aspirin EC  81 mg Oral Daily  . [START ON 01/16/2017] calcium-vitamin D  1 tablet Oral BID  . enoxaparin (LOVENOX) injection  40 mg Subcutaneous Q24H  . [START ON 01/16/2017] glimepiride  2 mg Oral Q breakfast  . insulin aspart  0-9 Units Subcutaneous TID WC  . metoCLOPramide  5 mg Oral BID  . metoprolol tartrate  75 mg Oral BID  .  pantoprazole  40 mg Oral Daily  . rosuvastatin  5 mg Oral Daily  . [START ON 01/16/2017] sertraline  50 mg Oral Daily   Infusions:  . 0.9 % NaCl with KCl 40 mEq / L    . [START ON 01/17/2017] levofloxacin (LEVAQUIN) IV    . magnesium sulfate 1 - 4 g bolus IVPB    . potassium chloride      Darrow Bussing, PharmD Pharmacy Resident 01/15/2017 5:24 PM

## 2017-01-15 NOTE — ED Notes (Addendum)
Date and time results received: 01/15/17 1337   Test: Trop, K+ Critical Value:0.03, K+ 2.7  Name of Provider Notified: Dr. Kerman Passey  Orders Received? Or Actions Taken?: No new orders received.

## 2017-01-15 NOTE — Assessment & Plan Note (Signed)
Sugars recently elevated.  a1c 8.0.  Will need to treat acute infection.  Will need f/u for her sugars.  Check and record sugars and bring to f/u appt.

## 2017-01-15 NOTE — Assessment & Plan Note (Signed)
Persistent cough, congestion, wheezing and weakness.  Was seen at acute care several days ago.  States was diagnosed with pneumonia.  No cxr.  Was placed on azithromycin.  Comes in today and is feeling no better. Is weak.  Not eating.  Nausea.  Orthostatic on exam.  Discussed with her and her daughter and great granddaughter.  Feel she warrants further evaluation and treatment in ER/hospital.  They are in agreement.  ER notified.

## 2017-01-15 NOTE — ED Notes (Signed)
Patient placed on 2 L O2 via [place]

## 2017-01-16 DIAGNOSIS — J471 Bronchiectasis with (acute) exacerbation: Secondary | ICD-10-CM

## 2017-01-16 DIAGNOSIS — R590 Localized enlarged lymph nodes: Secondary | ICD-10-CM

## 2017-01-16 LAB — BASIC METABOLIC PANEL
ANION GAP: 11 (ref 5–15)
ANION GAP: 11 (ref 5–15)
BUN: 16 mg/dL (ref 6–20)
BUN: 15 mg/dL (ref 6–20)
CO2: 27 mmol/L (ref 22–32)
CO2: 24 mmol/L (ref 22–32)
CREATININE: 0.89 mg/dL (ref 0.44–1.00)
Calcium: 7.9 mg/dL — ABNORMAL LOW (ref 8.9–10.3)
Calcium: 8.1 mg/dL — ABNORMAL LOW (ref 8.9–10.3)
Chloride: 88 mmol/L — ABNORMAL LOW (ref 101–111)
Chloride: 91 mmol/L — ABNORMAL LOW (ref 101–111)
Creatinine, Ser: 0.94 mg/dL (ref 0.44–1.00)
GFR calc Af Amer: >60 mL/min (ref >60)
GFR calc Af Amer: >60 mL/min (ref >60)
GFR, EST NON AFRICAN AMERICAN: 54 mL/min — AB (ref >60)
GFR, EST NON AFRICAN AMERICAN: 57 mL/min — AB (ref >60)
GLUCOSE: 153 mg/dL — AB (ref 65–99)
Glucose, Bld: 238 mg/dL — ABNORMAL HIGH (ref 65–99)
POTASSIUM: 3.5 mmol/L (ref 3.5–5.1)
Potassium: 4.1 mmol/L (ref 3.5–5.1)
SODIUM: 126 mmol/L — AB (ref 135–145)
Sodium: 126 mmol/L — ABNORMAL LOW (ref 135–145)

## 2017-01-16 LAB — GLUCOSE, CAPILLARY
GLUCOSE-CAPILLARY: 174 mg/dL — AB (ref 65–99)
GLUCOSE-CAPILLARY: 209 mg/dL — AB (ref 65–99)
Glucose-Capillary: 216 mg/dL — ABNORMAL HIGH (ref 65–99)
Glucose-Capillary: 144 mg/dL — ABNORMAL HIGH (ref 65–99)

## 2017-01-16 LAB — CBC
HEMATOCRIT: 31.9 % — AB (ref 35.0–47.0)
Hemoglobin: 11.2 g/dL — ABNORMAL LOW (ref 12.0–16.0)
MCH: 29.2 pg (ref 26.0–34.0)
MCHC: 35.1 g/dL (ref 32.0–36.0)
MCV: 83.1 fL (ref 80.0–100.0)
PLATELETS: 286 10*3/uL (ref 150–440)
RBC: 3.84 MIL/uL (ref 3.80–5.20)
RDW: 14.4 % (ref 11.5–14.5)
WBC: 16.4 10*3/uL — ABNORMAL HIGH (ref 3.6–11.0)

## 2017-01-16 LAB — MRSA PCR SCREENING: MRSA by PCR: NEGATIVE

## 2017-01-16 LAB — BRAIN NATRIURETIC PEPTIDE: B Natriuretic Peptide: 172 pg/mL — ABNORMAL HIGH (ref 0.0–100.0)

## 2017-01-16 MED ORDER — FUROSEMIDE 10 MG/ML IJ SOLN
40.0000 mg | Freq: Once | INTRAMUSCULAR | Status: AC
  Administered 2017-01-16: 40 mg via INTRAVENOUS
  Filled 2017-01-16: qty 4

## 2017-01-16 MED ORDER — POTASSIUM CHLORIDE IN NACL 20-0.9 MEQ/L-% IV SOLN
INTRAVENOUS | Status: DC
  Filled 2017-01-16 (×2): qty 1000

## 2017-01-16 NOTE — Progress Notes (Addendum)
Debra Kirk for electrolytes Indication: hypokalemia  Assessment: 5/29 1230 K = 2.7 Mg = 1.7 Pt received KCl 40 mEq PO x 1 Pt also has KCl 40 mEq in fluids (75 mL/hr)  Goal of Therapy:  Electrolytes WNL  Plan:  Will give magnesium 2 g IV x 1 and KCl 10 mEq IV x 4  Will check K one hour after last dose   Allergies  Allergen Reactions  . Penicillins Rash    .pcn    Patient Measurements: Height: _0  (170.2 cm) Weight: 157 lb 13.6 oz (71.6 kg) IBW/kg (Calculated) : 61.6  Vital Signs: Temp: 99.4 F (37.4 C) (05/30 0001) Temp Source: Axillary (05/30 0001) BP: 142/81 (05/29 2318) Pulse Rate: 108 (05/29 2318) Intake/Output from previous day: 05/29 0701 - 05/30 0700 In: 947 [P.O.:240; I.V.:399; IV Piggyback:300] Out: 600 [Urine:600] Intake/Output from this shift: Total I/O In: 699 [I.V.:399; IV Piggyback:300] Out: 600 [Urine:600]  Labs:  Recent Labs  01/15/17 1231 01/16/17 0007  WBC 16.4* 16.4*  HGB 11.6* 11.2*  HCT 33.7* 31.9*  PLT 296 286  CREATININE 0.95 0.89  MG 1.7  --   ALBUMIN 3.2*  --   PROT 7.4  --   AST 24  --   ALT 14  --   ALKPHOS 75  --   BILITOT 1.0  --    Estimated Creatinine Clearance: 44.9 mL/min (by C-G formula based on SCr of 0.89 mg/dL).   Microbiology: No results found for this or any previous visit (from the past 720 hour(s)).  Medical History: Past Medical History:  Diagnosis Date  . Carotid artery disease ([place])   . Compression fracture    s/p fosamax, prolia  . Diabetes mellitus ([place])   . Femoral neuropathy    left, elevated CRP, ESR, negative FANA and ANCA  . Fibrocystic breast disease   . Hypercholesterolemia   . Hypertension     Medications:  Scheduled:  . amLODipine  5 mg Oral Daily  . aspirin EC  81 mg Oral Daily  . calcium-vitamin D  1 tablet Oral BID  . enoxaparin (LOVENOX) injection  40 mg Subcutaneous Q24H  . furosemide  40 mg Intravenous Once  . glimepiride  2 mg  Oral Q breakfast  . insulin aspart  0-9 Units Subcutaneous TID WC  . metoCLOPramide  5 mg Oral BID  . metoprolol tartrate  75 mg Oral BID  . pantoprazole  40 mg Oral Daily  . rosuvastatin  5 mg Oral Daily  . sertraline  50 mg Oral Daily   Infusions:  . 0.9 % NaCl with KCl 40 mEq / L 75 mL/hr (01/15/17 1819)  . [START ON 01/17/2017] levofloxacin (LEVAQUIN) IV      5/30 K+ 4.1. No supplement ordered. Recheck with AM labs.  5/31 AM K+ 3.5. No supplement ordered. Recheck BMP and magnesium with tomorrow AM labs.   Sim Boast, PharmD, BCPS  01/16/17 1:25 AM

## 2017-01-16 NOTE — Progress Notes (Signed)
Name: Debra Kirk MRN: [id number] DOB: 1929/09/14    ADMISSION DATE:  01/15/2017  CHIEF COMPLAINT:  SOB  BRIEF PATIENT DESCRIPTION: 83 year old female with Acute Respiratory Failure secondary to Pneumonia  SIGNIFICANT EVENTS  5/29 Patient admitted with PNA 5/30  Transferred to the ICU due to sob, requiring BiPAP   STUDIES:   5/29/CT chest >>Central peribronchial thickening with scattered infiltrates in bothlungs greatest in RIGHT lower lobe consistent with pneumonia.  HISTORY OF PRESENT ILLNESS:  Debra Kirk is an 83 year old female with known history of CAD,DM,Hypercholestremia.  Patient was admitted to the floor secondary to PNA.  Patient was transferred from the floor  To the ICU as the patient was having some respiratory distress and was initiated on BiPAP  PAST MEDICAL HISTORY :   has a past medical history of Carotid artery disease ([place]); Compression fracture; Diabetes mellitus ([place]); Femoral neuropathy; Fibrocystic breast disease; Hypercholesterolemia; and Hypertension.  has a past surgical history that includes Partial hysterectomy; Inguinal hernia repair; Appendectomy; Ovarian cyst removal; Cholecystectomy; and Hernia repair. Prior to Admission medications   Medication Sig Start Date End Date Taking? Authorizing Provider  amLODipine (NORVASC) 5 MG tablet TAKE 1 TABLET BY MOUTH DAILY 11/01/16  Yes Einar Pheasant, MD  aspirin 81 MG tablet Take 81 mg by mouth daily.   Yes [provider]  Calcium Carbonate-Vitamin D (CALCIUM 600+D) 600-400 MG-UNIT per tablet Take 1 tablet by mouth 2 (two) times daily.   Yes [provider]  glimepiride (AMARYL) 2 MG tablet TAKE 1 TABLET BY MOUTH EVERY DAY WITH BREAKFAST 08/14/16  Yes Einar Pheasant, MD  hydrochlorothiazide (HYDRODIURIL) 25 MG tablet TAKE 1 TABLET BY MOUTH EVERY DAY 10/15/16  Yes Einar Pheasant, MD  metoCLOPramide (REGLAN) 5 MG tablet Take 5 mg by mouth 2 (two) times daily.    Yes [provider]    metoprolol (LOPRESSOR) 50 MG tablet TAKE 1 AND 1/2 TABLET BY MOUTH TWICE A DAY 11/12/16  Yes Einar Pheasant, MD  omeprazole (PRILOSEC) 20 MG capsule TAKE ONE CAPSULE BY MOUTH TWICE A DAY 11/14/15  Yes Einar Pheasant, MD  rosuvastatin (CRESTOR) 5 MG tablet TAKE 1 TABLET (5 MG TOTAL) BY MOUTH DAILY. 11/12/16  Yes Einar Pheasant, MD  sertraline (ZOLOFT) 50 MG tablet TAKE 1 TABLET (50 MG TOTAL) BY MOUTH DAILY. 12/24/16  Yes Einar Pheasant, MD  BAYER CONTOUR TEST test strip CHECK DAILY AND AS NEEDED **E11.9** 10/03/16   Einar Pheasant, MD  denosumab (PROLIA) 60 MG/ML SOLN injection Inject 60 mg into the skin every 6 (six) months. Administer in upper arm, thigh, or abdomen    [provider]  meclizine (ANTIVERT) 12.5 MG tablet Take 1 tablet (12.5 mg total) by mouth 2 (two) times daily as needed for dizziness. 01/30/16   Einar Pheasant, MD   Allergies  Allergen Reactions  . Penicillins Rash    .pcn    FAMILY HISTORY:  family history includes Bone cancer in her sister; Congestive Heart Failure in her mother; Diabetes in her mother and sister; Heart disease in her father, mother, and sister; Rheum arthritis in her sister. SOCIAL HISTORY:  reports that she quit smoking about 58 years ago. She has never used smokeless tobacco. She reports that she does not drink alcohol or use drugs.  REVIEW OF SYSTEMS:   Constitutional: Negative for fever, chills, weight loss, malaise/fatigue and diaphoresis.  HENT: Negative for hearing loss, ear pain, nosebleeds, congestion, sore throat, neck pain, tinnitus and ear discharge.  Eyes: Negative for blurred vision, double vision, photophobia, pain, discharge and redness.  Respiratory: Negative for cough, hemoptysis, sputum production, shortness of breath, wheezing and stridor.   Cardiovascular: Negative for chest pain, palpitations, orthopnea, claudication, leg swelling and PND.  Gastrointestinal: Negative for heartburn, nausea, vomiting, abdominal pain,  diarrhea, constipation, blood in stool and melena.  Genitourinary: Negative for dysuria, urgency, frequency, hematuria and flank pain.  Musculoskeletal: Negative for myalgias, back pain, joint pain and falls.  Skin: Negative for itching and rash.  Neurological: Negative for dizziness, tingling, tremors, sensory change, speech change, focal weakness, seizures, loss of consciousness, weakness and headaches.  Endo/Heme/Allergies: Negative for environmental allergies and polydipsia. Does not bruise/bleed easily.  SUBJECTIVE: Patient states" that she feels better with BiPAP and is not short of breath as much"  VITAL SIGNS: Temp:  [98 F (36.7 C)-100.4 F (38 C)] 99.4 F (37.4 C) (05/30 0001) Pulse Rate:  [75-174] 108 (05/29 2318) Resp:  [16-35] 31 (05/29 2318) BP: (125-185)/(47-103) 142/81 (05/29 2318) SpO2:  [81 %-100 %] 99 % (05/29 2332) FiO2 (%):  [50 %] 50 % (05/30 0001) Weight:  [155 lb (70.3 kg)-157 lb 13.6 oz (71.6 kg)] 157 lb 13.6 oz (71.6 kg) (05/30 0001)  PHYSICAL EXAMINATION: General:  Elderly white female in some respiratory distress. Neuro:  Awake, Alert, oriented HEENT:  AT,[place],PERRLA Cardiovascular:  S1S2, Regular, no m/r/g noted Lungs:  Crackles,no wheezes, rhonchi noted Abdomen:  Soft, NT,ND +BS Musculoskeletal:  No edema, cyanosis noted Skin: warm,dry and intact   Recent Labs Lab 01/09/17 0903 01/15/17 1231 01/16/17 0007  NA 132* 124* 126*  K 3.3* 2.7* 4.1  CL 92* 86* 91*  CO2 29 26 24   BUN 13 18 15   CREATININE 1.01 0.95 0.89  GLUCOSE 218* 271* 153*    Recent Labs Lab 01/15/17 1231 01/16/17 0007  HGB 11.6* 11.2*  HCT 33.7* 31.9*  WBC 16.4* 16.4*  PLT 296 286   Dg Chest 2 View  Result Date: 01/15/2017 CLINICAL DATA:  Shortness of breath. EXAM: CHEST  2 VIEW COMPARISON:  03/23/2015 FINDINGS: There is asymmetric reticular density at the right base. Fullness of the right hilum which could reflect underlying adenopathy or mass. Background  hyperinflation, suspected emphysema. Borderline cardiomegaly. Remote L1 compression fracture. IMPRESSION: Infection and/or atelectasis at the right base. Prominent right hilum which could reflect underlying mass or adenopathy. Recommend chest CT follow-up. Electronically Signed   By: Monte Fantasia M.D.   On: 01/15/2017 12:59   Ct Chest W Contrast  Result Date: 01/15/2017 CLINICAL DATA:  Worsening shortness of breath over the last couple weeks, oxygen desaturation, possible pneumonia, abnormal chest radiograph EXAM: CT CHEST WITH CONTRAST TECHNIQUE: Multidetector CT imaging of the chest was performed during intravenous contrast administration. Sagittal and coronal MPR images reconstructed from axial data set. CONTRAST:  69mL ISOVUE-300 IOPAMIDOL (ISOVUE-300) INJECTION 61% IV COMPARISON:  06/04/2011 Radiographic correlation: Chest radiographs 01/15/2017 and 03/23/2015 FINDINGS: Cardiovascular: Atherosclerotic calcifications aorta, coronary arteries and proximal great vessels. Upper normal caliber ascending thoracic aorta 3.7 cm diameter. No pericardial effusion. Mediastinum/Nodes: Multiple mildly enlarged mediastinal and RIGHT hilar nodes. RIGHT superior hilar node 18 mm short axis image 76. Enlarged subcarinal node 13 mm short axis image 74. Enlarged precarinal lymph nodes 15 mm short axis image 69 and 11 mm short axis image 62. Numerous additional normal to mildly enlarged RIGHT paratracheal and superior mediastinal nodes. No axillary adenopathy. Base of cervical region unremarkable. Esophagus normal appearance. Lungs/Pleura: Central peribronchial thickening. RIGHT lower lobe infiltrate consistent with pneumonia.  Mild tree-in-bud infiltrates in the RIGHT upper lobe and scattered in LEFT lung. Subsegmental atelectasis LEFT lower lobe. No pleural effusion or pneumothorax. No discrete pulmonary mass/nodule. Upper Abdomen: No definite focal upper abdominal abnormalities. Musculoskeletal: Probable vertebral  hemangioma at T8 and potential additional vertebral hemangioma at T4 extending into RIGHT pedicle, unchanged. Superior endplate compression fracture of L1 with 25% anterior height loss, unchanged since 03/23/2015. IMPRESSION: Central peribronchial thickening with scattered infiltrates in both lungs greatest in RIGHT lower lobe consistent with pneumonia. RIGHT hilar and mediastinal adenopathy, potentially reactive in the setting of acute infiltrate; recommend follow-up CT chest with contrast in 3-4 months following resolution of the patient's acute process to exclude other causes of adenopathy including tumor and lymphoma. Aortic atherosclerosis and coronary artery calcification. Electronically Signed   By: Lavonia Dana M.D.   On: 01/15/2017 14:56   Dg Chest Port 1 View  Result Date: 01/15/2017 CLINICAL DATA:  83 year old female with acute respiratory distress. EXAM: PORTABLE CHEST 1 VIEW COMPARISON:  Chest CT dated 01/15/2017 FINDINGS: Single portable view of the chest demonstrate a focal confluent areas of nodularity in the right upper lobe and at the right lung base corresponding to the density seen on the prior CT and most consistent with pneumonia. There is no focal consolidation, pleural effusion, or pneumothorax. Mild bronchiectatic changes and perihilar streaky densities noted. The cardiac silhouette is within normal limits. There is atherosclerotic calcification of the aortic arch. No acute osseous pathology. IMPRESSION: Confluent nodular densities predominantly involving the right lung consistent with pneumonia as seen on the earlier CT. Electronically Signed   By: Anner Crete M.D.   On: 01/15/2017 23:49    ASSESSMENT / PLAN:  Acute Respiratory failure secondary to PNA Leukocytosis ?Pulmonary edema Hyponatremia Hyperlipidemia CAD  Plan Continue Bipap, wean as tolerated Continue Levaquin Lasix 40 mg X1 Follow BMET Bronchodilators Continue Amlodipine/metoprolol Continue  Rosuvastatin Replace electrolytes per usual guidelines. Follow cultures.  Continue Aspirin    Bincy Varughese,AG-ACNP Pulmonary and [place]   01/16/2017, 1:40 AM   Patient seen and examined with NP, agree with findings, assessment, plan. Acute respiratory failure. Auscultation of lungs there is bilateral scattered crackles. On my personal review of the patient's CT and chest x-ray, there is questionable infiltrate, there is bibasilar atelectasis, there is also significant right hilar, subcarinal, paratracheal lymphadenopathy. This may be his secondary to infection versus malignancy. Will need outpatient follow-up for possible bronchoscopy.  Marda Stalker, M.D. 01/16/2017

## 2017-01-16 NOTE — Progress Notes (Signed)
NS with 40K stopped by NP when pt was transferred by rapid response to ICU5.  Pt's lungs were very diminished and rhonchus.

## 2017-01-16 NOTE — Progress Notes (Signed)
PT Cancellation Note  Patient Details Name: Debra Kirk MRN: [id number] DOB: [birth year]   Cancelled Treatment:    Reason Eval/Treat Not Completed: Medical issues which prohibited therapy.  Pt transferred to CCU late PM 01/15/17 (d/t respiratory distress) before PT was able to evaluate pt.  D/t pt transferring to higher level of care, per PT protocol require new PT consult in order to participate in therapy (will discontinue current PT order d/t this).  Please re-consult PT when pt is medically appropriate to participate in PT.  Leitha Bleak, PT 01/16/17, 11:06 AM [phone]

## 2017-01-16 NOTE — Progress Notes (Signed)
[facility] [place] NAME: Debra Kirk    MR#:  [id number]  DATE OF BIRTH:  [birth year]  SUBJECTIVE:  CHIEF COMPLAINT:   Chief Complaint  Patient presents with  . Cough  . Shortness of Breath  The patient is a 83 year old Caucasian female with past medical history significant for history of carotid artery disease, diabetes, hyperlipidemia, hypertension, who presents to the hospital with complaints of cough, shortness of breath, fever, chest congestion for about one week. She was noted to be hypoxic, CT scan of the chest revealed bilateral pneumonia, more right than the left. She was admitted for sepsis. She was initiated on levofloxacin intravenously and improved clinically.  Review of Systems  Constitutional: Negative for chills, fever and weight loss.  HENT: Negative for congestion.   Eyes: Negative for blurred vision and double vision.  Respiratory: Positive for cough, sputum production and shortness of breath. Negative for wheezing.   Cardiovascular: Negative for chest pain, palpitations, orthopnea, leg swelling and PND.  Gastrointestinal: Negative for abdominal pain, blood in stool, constipation, diarrhea, nausea and vomiting.  Genitourinary: Negative for dysuria, frequency, hematuria and urgency.  Musculoskeletal: Negative for falls.  Neurological: Negative for dizziness, tremors, focal weakness and headaches.  Endo/Heme/Allergies: Does not bruise/bleed easily.  Psychiatric/Behavioral: Negative for depression. The patient does not have insomnia.     VITAL SIGNS: Blood pressure 136/70, pulse 80, temperature 98.5 F (36.9 C), temperature source Oral, resp. rate (!) 37, height 5\' 7"  (1.702 m), weight 71.6 kg (157 lb 13.6 oz), SpO2 94 %.  PHYSICAL EXAMINATION:   GENERAL:  83 y.o.-year-old patient lying in the bed with no acute distress.  EYES: Pupils equal, round, reactive to light and accommodation. No scleral icterus.  Extraocular muscles intact.  HEENT: Head atraumatic, normocephalic. Oropharynx and nasopharynx clear.  NECK:  Supple, no jugular venous distention. No thyroid enlargement, no tenderness.  LUNGS: Normal breath sounds bilaterally, no wheezing, rales,rhonchi or crepitation, diminished in the right base. No use of accessory muscles of respiration.  CARDIOVASCULAR: S1, S2 normal. No murmurs, rubs, or gallops.  ABDOMEN: Soft, nontender, nondistended. Bowel sounds present. No organomegaly or mass.  EXTREMITIES: No pedal edema, cyanosis, or clubbing.  NEUROLOGIC: Cranial nerves II through XII are intact. Muscle strength 5/5 in all extremities. Sensation intact. Gait not checked.  PSYCHIATRIC: The patient is alert and oriented x 3.  SKIN: No obvious rash, lesion, or ulcer.   ORDERS/RESULTS REVIEWED:   CBC  Recent Labs Lab 01/15/17 1231 01/16/17 0007  WBC 16.4* 16.4*  HGB 11.6* 11.2*  HCT 33.7* 31.9*  PLT 296 286  MCV 82.5 83.1  MCH 28.5 29.2  MCHC 34.6 35.1  RDW 14.2 14.4  LYMPHSABS 1.2  --   MONOABS 1.6*  --   EOSABS 0.0  --   BASOSABS 0.0  --    ------------------------------------------------------------------------------------------------------------------  Chemistries   Recent Labs Lab 01/15/17 1231 01/16/17 0007 01/16/17 0415  NA 124* 126* 126*  K 2.7* 4.1 3.5  CL 86* 91* 88*  CO2 26 24 27   GLUCOSE 271* 153* 238*  BUN 18 15 16   CREATININE 0.95 0.89 0.94  CALCIUM 8.5* 8.1* 7.9*  MG 1.7  --   --   AST 24  --   --   ALT 14  --   --   ALKPHOS 75  --   --   BILITOT 1.0  --   --    ------------------------------------------------------------------------------------------------------------------ estimated creatinine clearance is 42.6 mL/min (  by C-G formula based on SCr of 0.94 mg/dL). ------------------------------------------------------------------------------------------------------------------ No results for input(s): TSH, T4TOTAL, T3FREE, THYROIDAB in the last 72  hours.  Invalid input(s): FREET3  Cardiac Enzymes  Recent Labs Lab 01/15/17 1231 01/16/17 0007  TROPONINI 0.03* 0.19*   ------------------------------------------------------------------------------------------------------------------ Invalid input(s): POCBNP ---------------------------------------------------------------------------------------------------------------  RADIOLOGY: Dg Chest 2 View  Result Date: 01/15/2017 CLINICAL DATA:  Shortness of breath. EXAM: CHEST  2 VIEW COMPARISON:  03/23/2015 FINDINGS: There is asymmetric reticular density at the right base. Fullness of the right hilum which could reflect underlying adenopathy or mass. Background hyperinflation, suspected emphysema. Borderline cardiomegaly. Remote L1 compression fracture. IMPRESSION: Infection and/or atelectasis at the right base. Prominent right hilum which could reflect underlying mass or adenopathy. Recommend chest CT follow-up. Electronically Signed   By: Monte Fantasia M.D.   On: 01/15/2017 12:59   Ct Chest W Contrast  Result Date: 01/15/2017 CLINICAL DATA:  Worsening shortness of breath over the last couple weeks, oxygen desaturation, possible pneumonia, abnormal chest radiograph EXAM: CT CHEST WITH CONTRAST TECHNIQUE: Multidetector CT imaging of the chest was performed during intravenous contrast administration. Sagittal and coronal MPR images reconstructed from axial data set. CONTRAST:  73mL ISOVUE-300 IOPAMIDOL (ISOVUE-300) INJECTION 61% IV COMPARISON:  06/04/2011 Radiographic correlation: Chest radiographs 01/15/2017 and 03/23/2015 FINDINGS: Cardiovascular: Atherosclerotic calcifications aorta, coronary arteries and proximal great vessels. Upper normal caliber ascending thoracic aorta 3.7 cm diameter. No pericardial effusion. Mediastinum/Nodes: Multiple mildly enlarged mediastinal and RIGHT hilar nodes. RIGHT superior hilar node 18 mm short axis image 76. Enlarged subcarinal node 13 mm short axis image 74.  Enlarged precarinal lymph nodes 15 mm short axis image 69 and 11 mm short axis image 62. Numerous additional normal to mildly enlarged RIGHT paratracheal and superior mediastinal nodes. No axillary adenopathy. Base of cervical region unremarkable. Esophagus normal appearance. Lungs/Pleura: Central peribronchial thickening. RIGHT lower lobe infiltrate consistent with pneumonia. Mild tree-in-bud infiltrates in the RIGHT upper lobe and scattered in LEFT lung. Subsegmental atelectasis LEFT lower lobe. No pleural effusion or pneumothorax. No discrete pulmonary mass/nodule. Upper Abdomen: No definite focal upper abdominal abnormalities. Musculoskeletal: Probable vertebral hemangioma at T8 and potential additional vertebral hemangioma at T4 extending into RIGHT pedicle, unchanged. Superior endplate compression fracture of L1 with 25% anterior height loss, unchanged since 03/23/2015. IMPRESSION: Central peribronchial thickening with scattered infiltrates in both lungs greatest in RIGHT lower lobe consistent with pneumonia. RIGHT hilar and mediastinal adenopathy, potentially reactive in the setting of acute infiltrate; recommend follow-up CT chest with contrast in 3-4 months following resolution of the patient's acute process to exclude other causes of adenopathy including tumor and lymphoma. Aortic atherosclerosis and coronary artery calcification. Electronically Signed   By: Lavonia Dana M.D.   On: 01/15/2017 14:56   Dg Chest Port 1 View  Result Date: 01/15/2017 CLINICAL DATA:  83 year old female with acute respiratory distress. EXAM: PORTABLE CHEST 1 VIEW COMPARISON:  Chest CT dated 01/15/2017 FINDINGS: Single portable view of the chest demonstrate a focal confluent areas of nodularity in the right upper lobe and at the right lung base corresponding to the density seen on the prior CT and most consistent with pneumonia. There is no focal consolidation, pleural effusion, or pneumothorax. Mild bronchiectatic changes and  perihilar streaky densities noted. The cardiac silhouette is within normal limits. There is atherosclerotic calcification of the aortic arch. No acute osseous pathology. IMPRESSION: Confluent nodular densities predominantly involving the right lung consistent with pneumonia as seen on the earlier CT. Electronically Signed   By: Laren Everts.D.  On: 01/15/2017 23:49    EKG:  Orders placed or performed during the hospital encounter of 01/15/17  . ED EKG  . ED EKG    ASSESSMENT AND PLAN:  Active Problems:   Sepsis ([place])  #1. Sepsis due to pneumonia, continue levofloxacin, clinically improved, blood cultures are negative so far, awaiting for sputum cultures, patient denies any dysphagia symptoms.  #2. Right lower lobe pneumonia, get sputum cultures if possible, continue levofloxacin, clinically improved. Discussed with Dr. Ashby Dawes.  #3. Hyponatremia, improved with therapy, follow in the morning #4. Hypokalemia, resolved, follow in the morning #5 elevated troponin, suspected demand ischemia, get echocardiogram, improved with IV fluid administration, continue metoprolol, discontinue IV fluids due to increasing respiration rate, Myoview stress test in August 2016 was normal, normal ejection fraction #6. Leukocytosis, follow with therapy  Management plans discussed with the patient, family and they are in agreement.   DRUG ALLERGIES:  Allergies  Allergen Reactions  . Penicillins Rash    .pcn    CODE STATUS:     Code Status Orders        Start     Ordered   01/15/17 1651  Full code  Continuous     01/15/17 1650    Code Status History    Date Active Date Inactive Code Status Order ID Comments User Context   This patient has a current code status but no historical code status.    Advance Directive Documentation     Most Recent Value  Type of Advance Directive  Healthcare Power of Attorney, Living will  Pre-existing out of facility DNR order (yellow form or pink MOST  form)  -  "MOST" Form in Place?  -      TOTAL TIME TAKING CARE OF THIS PATIENT: 40  minutes.  discussed with patient's daughter, granddaughter, daughter-in-law  Theodoro Grist M.D on 01/16/2017 at 2:57 PM  Between 7am to 6pm - Pager - [phone]  After 6pm go to [email or web address] - password [email or web address]  Office  [phone]  CC: Primary care physician; Einar Pheasant, MD

## 2017-01-16 NOTE — Progress Notes (Signed)
Patient ID: Debra Kirk, female   DOB: 04/19/1930, 83 y.o.   MRN: [id number]  Page by nurse for rapid response secondary to acute respiratory distress. Patient was admitted for acute respiratory failure secondary to pneumonia. Patient suddenly became short of breath with bibasilar crackles. She did received 1 albuterol neb treatment. She was found to have an O2 sat 81% on 2 L which did not respond to titration of O2 therefore BiPAP was initiated.  On my arrival patient's vital signs blood pressure 142/81, pulse 108, respirations 31 and O2 sat was 100% on nonrebreather.  Physical exam patient is in moderate distress, diaphoretic HEENT normocephalic atraumatic membranes are moist Heart is regular, tachycardic positive S1-S2, lungs reveal diffuse crackles Abdomen soft nontender Extremities are nontender, no cyanosis clubbing or edema.  Repeat chest x-ray reveals right-sided pneumonia consistent with previous  Assessments: Acute respiratory failure with hypoxia secondary to pneumonia with sepsis  Plan is to move patient to stepdown unit for continued titration of oxygen therapy including BiPAP, continue antibiotics Follow-up blood work. Discussed with intensivist who accepted transfer Monitor closely.

## 2017-01-16 NOTE — Progress Notes (Signed)
Pharmacy Antibiotic Note  Debra Kirk is a 83 y.o. female admitted on 01/15/2017 with pneumonia. Pharmacy has been consulted for levofloxacin dosing.  Plan: Continue levofloxacin 750 mg IV q 48 hours based on renal function.  Height: 5\' 7"  (170.2 cm) Weight: 157 lb 13.6 oz (71.6 kg) IBW/kg (Calculated) : 61.6  Temp (24hrs), Avg:99 F (37.2 C), Min:98 F (36.7 C), Max:100.4 F (38 C)   Recent Labs Lab 01/15/17 1231 01/16/17 0007 01/16/17 0415  WBC 16.4* 16.4*  --   CREATININE 0.95 0.89 0.94    Estimated Creatinine Clearance: 42.6 mL/min (by C-G formula based on SCr of 0.94 mg/dL).    Allergies  Allergen Reactions  . Penicillins Rash    .pcn    Antimicrobials this admission: levofloxacin 5/29 >>   Dose adjustments this admission:  Microbiology results: 5/29 BCx: NGTD 5/30 MRSA PCR: negative  Thank you for allowing pharmacy to be a part of this patient's care.  Ulice Dash, PharmD Clinical Pharmacist  01/16/2017 10:50 AM

## 2017-01-17 ENCOUNTER — Inpatient Hospital Stay
Admit: 2017-01-17 | Discharge: 2017-01-17 | Disposition: A | Discharge status: Home / Self Care | Payer: Medicare Other | Attending: Internal Medicine | Admitting: Internal Medicine

## 2017-01-17 LAB — BASIC METABOLIC PANEL
Anion gap: 10 (ref 5–15)
BUN: 18 mg/dL (ref 6–20)
CO2: 29 mmol/L (ref 22–32)
CREATININE: 0.77 mg/dL (ref 0.44–1.00)
Calcium: 7.7 mg/dL — ABNORMAL LOW (ref 8.9–10.3)
Chloride: 88 mmol/L — ABNORMAL LOW (ref 101–111)
GFR calc Af Amer: >60 mL/min (ref >60)
Glucose, Bld: 171 mg/dL — ABNORMAL HIGH (ref 65–99)
Potassium: 3 mmol/L — ABNORMAL LOW (ref 3.5–5.1)
SODIUM: 127 mmol/L — AB (ref 135–145)

## 2017-01-17 LAB — ECHOCARDIOGRAM COMPLETE
Height: 66 in
WEIGHTICAEL: 2480 [oz_av]

## 2017-01-17 LAB — MAGNESIUM
MAGNESIUM: 1.7 mg/dL (ref 1.7–2.4)
MAGNESIUM: 1.8 mg/dL (ref 1.7–2.4)

## 2017-01-17 LAB — GLUCOSE, CAPILLARY
GLUCOSE-CAPILLARY: 183 mg/dL — AB (ref 65–99)
Glucose-Capillary: 207 mg/dL — ABNORMAL HIGH (ref 65–99)
Glucose-Capillary: 142 mg/dL — ABNORMAL HIGH (ref 65–99)
Glucose-Capillary: 195 mg/dL — ABNORMAL HIGH (ref 65–99)

## 2017-01-17 LAB — TROPONIN I: Troponin I: 0.19 ng/mL (ref <0.03)

## 2017-01-17 MED ORDER — LEVOFLOXACIN 750 MG PO TABS
750.0000 mg | ORAL_TABLET | ORAL | Status: DC
  Administered 2017-01-17 – 2017-01-19 (×2): 750 mg via ORAL
  Filled 2017-01-17 (×2): qty 1

## 2017-01-17 MED ORDER — POTASSIUM CHLORIDE IN NACL 20-0.9 MEQ/L-% IV SOLN
INTRAVENOUS | Status: DC
  Administered 2017-01-17: 09:00:00 via INTRAVENOUS
  Filled 2017-01-17: qty 1000

## 2017-01-17 MED ORDER — HYDROCOD POLST-CPM POLST ER 10-8 MG/5ML PO SUER
5.0000 mL | Freq: Two times a day (BID) | ORAL | Status: DC
  Administered 2017-01-17 – 2017-01-19 (×4): 5 mL via ORAL
  Filled 2017-01-17 (×4): qty 5

## 2017-01-17 MED ORDER — POTASSIUM CHLORIDE CRYS ER 20 MEQ PO TBCR
30.0000 meq | EXTENDED_RELEASE_TABLET | ORAL | Status: AC
  Administered 2017-01-17 (×2): 30 meq via ORAL
  Filled 2017-01-17 (×2): qty 1

## 2017-01-17 MED ORDER — GUAIFENESIN-CODEINE 100-10 MG/5ML PO SOLN
10.0000 mL | ORAL | Status: DC | PRN
  Administered 2017-01-17: 10 mL via ORAL
  Filled 2017-01-17: qty 10

## 2017-01-17 NOTE — Progress Notes (Signed)
*  PRELIMINARY RESULTS* Echocardiogram 2D Echocardiogram has been performed.  Debra Kirk 01/17/2017, 9:51 AM

## 2017-01-17 NOTE — Progress Notes (Signed)
[place] NOTE  Pharmacy Consult for electrolytes Indication: hypokalemia  Assessment: 5/31 0408 K = 3.0   Goal of Therapy:  Electrolytes WNL  Plan:  Ordered KCL 47mq Q4H x 2 dose. Will recheck electrolytes with AM labs.    Allergies  Allergen Reactions  . Penicillins Rash    .pcn    Patient Measurements: Height: _0  (167.6 cm) Weight: 155 lb (70.3 kg) IBW/kg (Calculated) : 59.3  Vital Signs: Temp: 98.2 F (36.8 C) (05/31 1004) Temp Source: Oral (05/31 1004) BP: 140/59 (05/31 1004) Pulse Rate: 74 (05/31 1004) Intake/Output from previous day: 05/30 0701 - 05/31 0700 In: 120 [P.O.:120] Out: 700 [Urine:700] Intake/Output from this shift: Total I/O In: 240 [P.O.:240] Out: -   Labs:  Recent Labs  01/15/17 1231 01/16/17 0007 01/16/17 0415 01/17/17 0408  WBC 16.4* 16.4*  --   --   HGB 11.6* 11.2*  --   --   HCT 33.7* 31.9*  --   --   PLT 296 286  --   --   CREATININE 0.95 0.89 0.94 0.77  MG 1.7  --   --  1.8  ALBUMIN 3.2*  --   --   --   PROT 7.4  --   --   --   AST 24  --   --   --   ALT 14  --   --   --   ALKPHOS 75  --   --   --   BILITOT 1.0  --   --   --    Estimated Creatinine Clearance: 48.1 mL/min (by C-G formula based on SCr of 0.77 mg/dL).   Microbiology: Recent Results (from the past 720 hour(s))  Blood culture (routine x 2)     Status: None (Preliminary result)   Collection Time: 01/15/17  2:16 PM  Result Value Ref Range Status   Specimen Description BLOOD LEFT HAND  Final   Special Requests BOTTLES DRAWN AEROBIC AND ANAEROBIC BCAV  Final   Culture NO GROWTH 2 DAYS  Final   Report Status PENDING  Incomplete  Blood culture (routine x 2)     Status: None (Preliminary result)   Collection Time: 01/15/17  2:16 PM  Result Value Ref Range Status   Specimen Description BLOOD LEFT ANTECUBITAL  Final   Special Requests   Final    BOTTLES DRAWN AEROBIC AND ANAEROBIC Blood Culture adequate volume   Culture NO GROWTH 2 DAYS   Final   Report Status PENDING  Incomplete  MRSA PCR Screening     Status: None   Collection Time: 01/16/17 12:11 AM  Result Value Ref Range Status   MRSA by PCR NEGATIVE NEGATIVE Final    Comment:        The GeneXpert MRSA Assay (FDA approved for NASAL specimens only), is one component of a comprehensive MRSA colonization surveillance program. It is not intended to diagnose MRSA infection nor to guide or monitor treatment for MRSA infections.     Medical History: Past Medical History:  Diagnosis Date  . Carotid artery disease ([place]   . Compression fracture    s/p fosamax, prolia  . Diabetes mellitus ([facility]   . Femoral neuropathy    left, elevated CRP, ESR, negative FANA and ANCA  . Fibrocystic breast disease   . Hypercholesterolemia   . Hypertension     Medications:  Scheduled:  . aspirin EC  81 mg Oral Daily  . calcium-vitamin D  1 tablet Oral BID  .  enoxaparin (LOVENOX) injection  40 mg Subcutaneous Q24H  . glimepiride  2 mg Oral Q breakfast  . insulin aspart  0-9 Units Subcutaneous TID WC  . metoCLOPramide  5 mg Oral BID  . metoprolol tartrate  75 mg Oral BID  . pantoprazole  40 mg Oral Daily  . potassium chloride  30 mEq Oral Q4H  . rosuvastatin  5 mg Oral Daily  . sertraline  50 mg Oral Daily   Infusions:  . 0.9 % NaCl with KCl 20 mEq / L 75 mL/hr at 01/17/17 0917  . levofloxacin (LEVAQUIN) IV      5/30 K+ 4.1. No supplement ordered. Recheck with AM labs.  5/31 AM K+ 3.5. No supplement ordered. Recheck BMP and magnesium with tomorrow AM labs.   Sim Boast, PharmD, BCPS  01/17/17 10:17 AM

## 2017-01-17 NOTE — Evaluation (Signed)
Physical Therapy Evaluation Patient Details Name: Debra Kirk MRN: [id number] DOB: 07/03/1930 Today's Date: 01/17/2017   History of Present Illness  Pt is an 83 y.o. female presenting to hospital with cough and SOB x1 week.  Pt admitted with sepsis secondary to R>L PNA.  Pt transferred to CCU 01/15/17 after rapid response called d/t acute respiratory distress requiring BiPAP (pt now transferred back to floor).  Elevated troponin suspected d/t demand ischemia.  PMH includes DM, L femoral neuropathy, htn, compression fx.  Clinical Impression  Prior to hospital admission, pt was modified independent ambulating with SPC.  Pt lives alone in 1 level home with 2 steps to enter no railing.  Currently pt is modified independent with bed mobility and CGA with transfers and ambulation up to 100 feet with RW (pt unsteady without UE support ambulating so pt transitioned to RW which significantly improved pt's balance and ambulation safety).  Pt laying in bed without any supplemental O2 on upon PT entering room (pt reporting nursing staff removed it earlier).  Pt's O2 sats 91-94% on room air during therapy session and post ambulation.  Pt would benefit from skilled PT to address noted impairments and functional limitations (see below for any additional details).  Upon hospital discharge, recommend pt discharge to home with HHPT and use of RW.    Follow Up Recommendations Home health PT    Equipment Recommendations  Rolling walker with 5" wheels    Recommendations for Other Services       Precautions / Restrictions Precautions Precautions: Fall Restrictions Weight Bearing Restrictions: No      Mobility  Bed Mobility Overal bed mobility: Modified Independent             General bed mobility comments: Supine to/from sit with HOB elevated.  Transfers Overall transfer level: Needs assistance Equipment used: None;Rolling walker (2 wheeled) Transfers: Sit to/from Stand Sit to Stand: Min  guard         General transfer comment: increased effort to stand without assistive device (pt holding onto bed for stability) but improved stability noted with use of RW  Ambulation/Gait Ambulation/Gait assistance: Min guard;Min assist Ambulation Distance (Feet):  (100 feet with IV pole; 100 feet with RW) Assistive device: Rolling walker (2 wheeled) (IV pole)   Gait velocity: decreased   General Gait Details: pt unsteady without assistive device (requiring min assist) so pt holding onto IV pole on 1st ambulation trial; 2nd trial pt steady with use of RW (CGA)  Stairs            Wheelchair Mobility    Modified Rankin (Stroke Patients Only)       Balance Overall balance assessment: Needs assistance Sitting-balance support: No upper extremity supported;Feet supported Sitting balance-Leahy Scale: Good Sitting balance - Comments: sitting reaching within BOS   Standing balance support: No upper extremity supported Standing balance-Leahy Scale: Fair Standing balance comment: static standing                             Pertinent Vitals/Pain Pain Assessment: No/denies pain  HR WFL during session.    Home Living Family/patient expects to be discharged to:: Private residence Living Arrangements: Alone Available Help at Discharge: Family (Pt's daughter lives across the street) Type of Home: House Home Access: Stairs to enter Entrance Stairs-Rails: None Entrance Stairs-Number of Steps: 2 Home Layout: One level Home Equipment: Environmental consultant - 4 wheels;Cane - single point      Prior  Function Level of Independence: Independent with assistive device(s)         Comments: Pt ambulates with SPC.  Pt denies any falls in past 6 months.     Hand Dominance        Extremity/Trunk Assessment   Upper Extremity Assessment Upper Extremity Assessment: Generalized weakness    Lower Extremity Assessment Lower Extremity Assessment: Generalized weakness    Cervical  / Trunk Assessment Cervical / Trunk Assessment: Normal  Communication   Communication: HOH  Cognition Arousal/Alertness: Awake/alert Behavior During Therapy: WFL for tasks assessed/performed Overall Cognitive Status: Within Functional Limits for tasks assessed                                        General Comments General comments (skin integrity, edema, etc.): Pt resting in bed without supplemental O2 on upon PT entering room (reports staff removed it earlier).  Nursing cleared pt for participation in physical therapy.  Pt agreeable to PT session.  L hand swelling noted (pt reporting nursing aware and already elevated on 2 pillows).  Student nurse reporting plan to switch IV sites after PT session.    Exercises  Gait training with RW.   Assessment/Plan    PT Assessment Patient needs continued PT services  PT Problem List Decreased strength;Decreased activity tolerance;Decreased balance;Decreased mobility       PT Treatment Interventions DME instruction;Gait training;Stair training;Functional mobility training;Therapeutic activities;Therapeutic exercise;Balance training;Patient/family education    PT Goals (Current goals can be found in the Care Plan section)  Acute Rehab PT Goals Patient Stated Goal: to go home PT Goal Formulation: With patient Time For Goal Achievement: 01/31/17 Potential to Achieve Goals: Good    Frequency Min 2X/week   Barriers to discharge        Co-evaluation               AM-PAC PT "6 Clicks" Daily Activity  Outcome Measure Difficulty turning over in bed (including adjusting bedclothes, sheets and blankets)?: None Difficulty moving from lying on back to sitting on the side of the bed? : None Difficulty sitting down on and standing up from a chair with arms (e.g., wheelchair, bedside commode, etc,.)?: A Little Help needed moving to and from a bed to chair (including a wheelchair)?: A Little Help needed walking in hospital  room?: A Little Help needed climbing 3-5 steps with a railing? : A Little 6 Click Score: 20    End of Session Equipment Utilized During Treatment: Gait belt Activity Tolerance: Patient tolerated treatment well Patient left: in bed;with call bell/phone within reach;with bed alarm set Nurse Communication: Mobility status;Precautions (Pt's O2 status during session.) PT Visit Diagnosis: Unsteadiness on feet (R26.81);Muscle weakness (generalized) (M62.81);Difficulty in walking, not elsewhere classified (R26.2)    Time: 1534-1600 PT Time Calculation (min) (ACUTE ONLY): 26 min   Charges:   PT Evaluation $PT Eval Low Complexity: 1 Procedure PT Treatments $Gait Training: 8-22 mins   PT G CodesLeitha Bleak, PT 01/17/17, 4:20 PM [phone]

## 2017-01-17 NOTE — Progress Notes (Signed)
Pharmacy Antibiotic Note  Debra Kirk is a 83 y.o. female admitted on 01/15/2017 with pneumonia. Pharmacy has been consulted for levofloxacin dosing.  Plan: Patient is afebrile and taking other PO medications. Will transition patient to Continue levofloxacin 750 mg POq 48 hours based on renal function (CrCl <48ml/min)   Height: 5\' 6"  (167.6 cm) Weight: 155 lb (70.3 kg) IBW/kg (Calculated) : 59.3  Temp (24hrs), Avg:98.1 F (36.7 C), Min:97.8 F (36.6 C), Max:98.3 F (36.8 C)   Recent Labs Lab 01/15/17 1231 01/16/17 0007 01/16/17 0415 01/17/17 0408  WBC 16.4* 16.4*  --   --   CREATININE 0.95 0.89 0.94 0.77    Estimated Creatinine Clearance: 48.1 mL/min (by C-G formula based on SCr of 0.77 mg/dL).    Allergies  Allergen Reactions  . Penicillins Rash    .pcn    Antimicrobials this admission: levofloxacin 5/29 >>   Dose adjustments this admission:  Microbiology results: 5/29 BCx: NGTD 5/30 MRSA PCR: negative  Thank you for allowing pharmacy to be a part of this patient's care.  Pernell Dupre, PharmD, BCPS Clinical Pharmacist 01/17/2017 10:24 AM

## 2017-01-17 NOTE — Progress Notes (Signed)
[facility] [place] NAME: Debra Kirk    MR#:  [id number]  DATE OF BIRTH:  [birth year]  SUBJECTIVE:  CHIEF COMPLAINT:   Chief Complaint  Patient presents with  . Cough  . Shortness of Breath  The patient is a 83 year old Caucasian female with past medical history significant for history of carotid artery disease, diabetes, hyperlipidemia, hypertension, who presents to the hospital with complaints of cough, shortness of breath, fever, chest congestion for about one week. She was noted to be hypoxic, CT scan of the chest revealed bilateral pneumonia, more right than the left. She was admitted for sepsis. She was initiated on levofloxacin intravenously and Feels somewhat better, still continues to cough, producing minimal phlegm. PT evaluated the patient and recommended home health services and a rolling walker..  Review of Systems  Constitutional: Negative for chills, fever and weight loss.  HENT: Negative for congestion.   Eyes: Negative for blurred vision and double vision.  Respiratory: Positive for cough, sputum production and shortness of breath. Negative for wheezing.   Cardiovascular: Negative for chest pain, palpitations, orthopnea, leg swelling and PND.  Gastrointestinal: Negative for abdominal pain, blood in stool, constipation, diarrhea, nausea and vomiting.  Genitourinary: Negative for dysuria, frequency, hematuria and urgency.  Musculoskeletal: Negative for falls.  Neurological: Negative for dizziness, tremors, focal weakness and headaches.  Endo/Heme/Allergies: Does not bruise/bleed easily.  Psychiatric/Behavioral: Negative for depression. The patient does not have insomnia.     VITAL SIGNS: Blood pressure (!) 141/63, pulse 82, temperature 97.7 F (36.5 C), temperature source Oral, resp. rate (!) 22, height 5\' 6"  (1.676 m), weight 70.3 kg (155 lb), SpO2 92 %.  PHYSICAL EXAMINATION:   GENERAL:  83 y.o.-year-old  patient lying in the bed in mild respiratory distress, coughing intermittently, rattling in the chest.  EYES: Pupils equal, round, reactive to light and accommodation. No scleral icterus. Extraocular muscles intact.  HEENT: Head atraumatic, normocephalic. Oropharynx and nasopharynx clear.  NECK:  Supple, no jugular venous distention. No thyroid enlargement, no tenderness.  LUNGS: Normal breath sounds on the left, no wheezing, but significant scattered rales,rhonchi and crepitations on the right. Intermittent use of accessory muscles of respiration.  CARDIOVASCULAR: S1, S2 normal. No murmurs, rubs, or gallops.  ABDOMEN: Soft, nontender, nondistended. Bowel sounds present. No organomegaly or mass.  EXTREMITIES: No pedal edema, cyanosis, or clubbing.  NEUROLOGIC: Cranial nerves II through XII are intact. Muscle strength 5/5 in all extremities. Sensation intact. Gait not checked.  PSYCHIATRIC: The patient is alert and oriented x 3.  SKIN: No obvious rash, lesion, or ulcer.   ORDERS/RESULTS REVIEWED:   CBC  Recent Labs Lab 01/15/17 1231 01/16/17 0007  WBC 16.4* 16.4*  HGB 11.6* 11.2*  HCT 33.7* 31.9*  PLT 296 286  MCV 82.5 83.1  MCH 28.5 29.2  MCHC 34.6 35.1  RDW 14.2 14.4  LYMPHSABS 1.2  --   MONOABS 1.6*  --   EOSABS 0.0  --   BASOSABS 0.0  --    ------------------------------------------------------------------------------------------------------------------  Chemistries   Recent Labs Lab 01/15/17 1231 01/16/17 0007 01/16/17 0415 01/17/17 0408  NA 124* 126* 126* 127*  K 2.7* 4.1 3.5 3.0*  CL 86* 91* 88* 88*  CO2 26 24 27 29   GLUCOSE 271* 153* 238* 171*  BUN 18 15 16 18   CREATININE 0.95 0.89 0.94 0.77  CALCIUM 8.5* 8.1* 7.9* 7.7*  MG 1.7  --   --  1.8  AST 24  --   --   --  ALT 14  --   --   --   ALKPHOS 75  --   --   --   BILITOT 1.0  --   --   --     ------------------------------------------------------------------------------------------------------------------ estimated creatinine clearance is 48.1 mL/min (by C-G formula based on SCr of 0.77 mg/dL). ------------------------------------------------------------------------------------------------------------------ No results for input(s): TSH, T4TOTAL, T3FREE, THYROIDAB in the last 72 hours.  Invalid input(s): FREET3  Cardiac Enzymes  Recent Labs Lab 01/15/17 1231 01/16/17 0007  TROPONINI 0.03* 0.19*   ------------------------------------------------------------------------------------------------------------------ Invalid input(s): POCBNP ---------------------------------------------------------------------------------------------------------------  RADIOLOGY: Dg Chest Port 1 View  Result Date: 01/15/2017 CLINICAL DATA:  83 year old female with acute respiratory distress. EXAM: PORTABLE CHEST 1 VIEW COMPARISON:  Chest CT dated 01/15/2017 FINDINGS: Single portable view of the chest demonstrate a focal confluent areas of nodularity in the right upper lobe and at the right lung base corresponding to the density seen on the prior CT and most consistent with pneumonia. There is no focal consolidation, pleural effusion, or pneumothorax. Mild bronchiectatic changes and perihilar streaky densities noted. The cardiac silhouette is within normal limits. There is atherosclerotic calcification of the aortic arch. No acute osseous pathology. IMPRESSION: Confluent nodular densities predominantly involving the right lung consistent with pneumonia as seen on the earlier CT. Electronically Signed   By: Anner Crete M.D.   On: 01/15/2017 23:49    EKG:  Orders placed or performed during the hospital encounter of 01/15/17  . ED EKG  . ED EKG    ASSESSMENT AND PLAN:  Active Problems:   Sepsis ([place])  #1. Sepsis due to pneumonia, continue levofloxacin, clinically improved, blood cultures  are negative , Get sputum cultures, if possible,  patient denied dysphagia symptoms.  #2. Right lower lobe pneumonia, get sputum cultures if possible, continue levofloxacin, clinically improved.  Fever is subsiding #3. Hyponatremia, minimally improved with therapy, follow in the morning, get urine osmolarity to rule out SIADH #4. Hypokalemia, supplementing orally, recheck labs in the morning  #5 elevated troponin, suspected demand ischemia, echocardiogram was normal, continue metoprolol, aspirin, Crestor. Myoview stress test in August 2016 was normal, normal ejection fraction #6. Leukocytosis, follow in the morning  Management plans discussed with the patient, family and they are in agreement.   DRUG ALLERGIES:  Allergies  Allergen Reactions  . Penicillins Rash    .pcn    CODE STATUS:     Code Status Orders        Start     Ordered   01/15/17 1651  Full code  Continuous     01/15/17 1650    Code Status History    Date Active Date Inactive Code Status Order ID Comments User Context   This patient has a current code status but no historical code status.    Advance Directive Documentation     Most Recent Value  Type of Advance Directive  Healthcare Power of Attorney, Living will  Pre-existing out of facility DNR order (yellow form or pink MOST form)  -  "MOST" Form in Place?  -      TOTAL TIME TAKING CARE OF THIS PATIENT: 30 minutes.    Theodoro Grist M.D on 01/17/2017 at 4:45 PM  Between 7am to 6pm - Pager - [phone]  After 6pm go to [email or web address] - password [email or web address] [facility]  Office  [phone]  CC: Primary care physician; Einar Pheasant, MD

## 2017-01-17 NOTE — Telephone Encounter (Signed)
-----   Message from Laverle Hobby, MD sent at 01/17/2017  8:02 AM EDT ----- Regarding: hfu Pt needs follow up in 3-4 weeks for hilar lymphadenopathy

## 2017-01-17 NOTE — Telephone Encounter (Signed)
Lmov for patient to call back and schedule appt °

## 2017-01-18 ENCOUNTER — Inpatient Hospital Stay: Payer: Medicare Other

## 2017-01-18 DIAGNOSIS — M79605 Pain in left leg: Secondary | ICD-10-CM

## 2017-01-18 DIAGNOSIS — D72829 Elevated white blood cell count, unspecified: Secondary | ICD-10-CM

## 2017-01-18 DIAGNOSIS — R590 Localized enlarged lymph nodes: Secondary | ICD-10-CM

## 2017-01-18 DIAGNOSIS — E119 Type 2 diabetes mellitus without complications: Secondary | ICD-10-CM

## 2017-01-18 DIAGNOSIS — J189 Pneumonia, unspecified organism: Secondary | ICD-10-CM

## 2017-01-18 DIAGNOSIS — E871 Hypo-osmolality and hyponatremia: Secondary | ICD-10-CM

## 2017-01-18 DIAGNOSIS — R531 Weakness: Secondary | ICD-10-CM

## 2017-01-18 DIAGNOSIS — E876 Hypokalemia: Secondary | ICD-10-CM

## 2017-01-18 DIAGNOSIS — I248 Other forms of acute ischemic heart disease: Secondary | ICD-10-CM

## 2017-01-18 DIAGNOSIS — I10 Essential (primary) hypertension: Secondary | ICD-10-CM

## 2017-01-18 DIAGNOSIS — R600 Localized edema: Secondary | ICD-10-CM

## 2017-01-18 LAB — GLUCOSE, CAPILLARY
Glucose-Capillary: 138 mg/dL — ABNORMAL HIGH (ref 65–99)
Glucose-Capillary: 187 mg/dL — ABNORMAL HIGH (ref 65–99)
Glucose-Capillary: 252 mg/dL — ABNORMAL HIGH (ref 65–99)
Glucose-Capillary: 318 mg/dL — ABNORMAL HIGH (ref 65–99)

## 2017-01-18 LAB — BASIC METABOLIC PANEL
Anion gap: 10 (ref 5–15)
BUN: 16 mg/dL (ref 6–20)
CALCIUM: 8 mg/dL — AB (ref 8.9–10.3)
CO2: 27 mmol/L (ref 22–32)
CREATININE: 0.68 mg/dL (ref 0.44–1.00)
Chloride: 92 mmol/L — ABNORMAL LOW (ref 101–111)
GFR calc Af Amer: >60 mL/min (ref >60)
GLUCOSE: 132 mg/dL — AB (ref 65–99)
Potassium: 3.7 mmol/L (ref 3.5–5.1)
Sodium: 129 mmol/L — ABNORMAL LOW (ref 135–145)

## 2017-01-18 LAB — CBC
HCT: 34.2 % — ABNORMAL LOW (ref 35.0–47.0)
Hemoglobin: 11.7 g/dL — ABNORMAL LOW (ref 12.0–16.0)
MCH: 28.8 pg (ref 26.0–34.0)
MCHC: 34.1 g/dL (ref 32.0–36.0)
MCV: 84.3 fL (ref 80.0–100.0)
PLATELETS: 364 10*3/uL (ref 150–440)
RBC: 4.06 MIL/uL (ref 3.80–5.20)
RDW: 14.4 % (ref 11.5–14.5)
WBC: 11.5 10*3/uL — ABNORMAL HIGH (ref 3.6–11.0)

## 2017-01-18 LAB — MAGNESIUM: Magnesium: 1.9 mg/dL (ref 1.7–2.4)

## 2017-01-18 MED ORDER — ALBUTEROL SULFATE (2.5 MG/3ML) 0.083% IN NEBU: 2.5000 mg | INHALATION_SOLUTION | RESPIRATORY_TRACT | 12 refills | Status: DC

## 2017-01-18 MED ORDER — GUAIFENESIN-CODEINE 100-10 MG/5ML PO SOLN: 10.0000 mL | ORAL | 0 refills | Status: DC | PRN

## 2017-01-18 MED ORDER — CODEINE SULFATE 30 MG PO TABS: 15.0000 mg | ORAL_TABLET | ORAL | Status: DC | PRN

## 2017-01-18 MED ORDER — ALBUTEROL SULFATE (2.5 MG/3ML) 0.083% IN NEBU
2.5000 mg | INHALATION_SOLUTION | RESPIRATORY_TRACT | Status: DC
  Administered 2017-01-18 (×3): 2.5 mg via RESPIRATORY_TRACT
  Filled 2017-01-18 (×3): qty 3

## 2017-01-18 MED ORDER — ALBUTEROL SULFATE (2.5 MG/3ML) 0.083% IN NEBU
2.5000 mg | INHALATION_SOLUTION | Freq: Four times a day (QID) | RESPIRATORY_TRACT | Status: DC
  Administered 2017-01-18 – 2017-01-19 (×3): 2.5 mg via RESPIRATORY_TRACT
  Filled 2017-01-18 (×4): qty 3

## 2017-01-18 MED ORDER — BUDESONIDE-FORMOTEROL FUMARATE 160-4.5 MCG/ACT IN AERO: 2.0000 | INHALATION_SPRAY | Freq: Two times a day (BID) | RESPIRATORY_TRACT | 12 refills | Status: DC

## 2017-01-18 MED ORDER — HYDROCOD POLST-CPM POLST ER 10-8 MG/5ML PO SUER: 5.0000 mL | Freq: Two times a day (BID) | ORAL | 0 refills | Status: DC

## 2017-01-18 MED ORDER — IOPAMIDOL (ISOVUE-370) INJECTION 76%
125.0000 mL | Freq: Once | INTRAVENOUS | Status: AC | PRN
  Administered 2017-01-18: 125 mL via INTRAVENOUS

## 2017-01-18 MED ORDER — LEVOFLOXACIN 750 MG PO TABS: 750.0000 mg | ORAL_TABLET | ORAL | 0 refills | Status: DC

## 2017-01-18 MED ORDER — METHYLPREDNISOLONE SODIUM SUCC 125 MG IJ SOLR
60.0000 mg | INTRAMUSCULAR | Status: DC
  Administered 2017-01-18 – 2017-01-19 (×2): 60 mg via INTRAVENOUS
  Filled 2017-01-18 (×2): qty 2

## 2017-01-18 MED ORDER — PREDNISONE 10 MG PO TABS: 10.0000 mg | ORAL_TABLET | Freq: Every day | ORAL | 0 refills | Status: DC

## 2017-01-18 NOTE — Progress Notes (Signed)
Pt refused to wear BIPAP. Pt on room air. No distress noted. 

## 2017-01-18 NOTE — Clinical Social Work Note (Signed)
PT recommending home health, CSW to sign off please reconsult if other social work needs arise.  Jones Broom. [place], MSW, [place]  01/18/2017 9:09 AM

## 2017-01-18 NOTE — Progress Notes (Signed)
[facility] [place] NAME: Debra Kirk    MR#:  [id number]  DATE OF BIRTH:  23-Feb-1930  SUBJECTIVE:  CHIEF COMPLAINT:   Chief Complaint  Patient presents with  . Cough  . Shortness of Breath  The patient is a 83 year old Caucasian female with past medical history significant for history of carotid artery disease, diabetes, hyperlipidemia, hypertension, who presents to the hospital with complaints of cough, shortness of breath, fever, chest congestion for about one week. She was noted to be hypoxic, CT scan of the chest revealed bilateral pneumonia, more right than the left. She was admitted for sepsis. She was initiated on levofloxacin intravenously and feels somewhat better, still continues to cough, producing minimal phlegm. PT evaluated the patient and recommended home health services and a rolling walker. Chest x-ray today revealed improvement, however, patient is too weak to go home. Left lower extremity swelling was noted, Doppler ultrasound showed plaque in the common femoral artery on the left, consultation with Dr. dew was obtained, who recommended. CT angiogram with femoral runoff, results are pending.  Review of Systems  Constitutional: Negative for chills, fever and weight loss.  HENT: Negative for congestion.   Eyes: Negative for blurred vision and double vision.  Respiratory: Positive for cough, sputum production and shortness of breath. Negative for wheezing.   Cardiovascular: Negative for chest pain, palpitations, orthopnea, leg swelling and PND.  Gastrointestinal: Negative for abdominal pain, blood in stool, constipation, diarrhea, nausea and vomiting.  Genitourinary: Negative for dysuria, frequency, hematuria and urgency.  Musculoskeletal: Negative for falls.  Neurological: Negative for dizziness, tremors, focal weakness and headaches.  Endo/Heme/Allergies: Does not bruise/bleed easily.  Psychiatric/Behavioral:  Negative for depression. The patient does not have insomnia.     VITAL SIGNS: Blood pressure (!) 156/76, pulse 88, temperature 98.1 F (36.7 C), temperature source Oral, resp. rate 18, height 5\' 6"  (1.676 m), weight 70.3 kg (155 lb), SpO2 92 %.  PHYSICAL EXAMINATION:   GENERAL:  83 y.o.-year-old patient lying in the bed in no respiratory distress EYES: Pupils equal, round, reactive to light and accommodation. No scleral icterus. Extraocular muscles intact.  HEENT: Head atraumatic, normocephalic. Oropharynx and nasopharynx clear.  NECK:  Supple, no jugular venous distention. No thyroid enlargement, no tenderness.  LUNGS: Better air entrance bilaterally  scattered rhonchi , wheezes but no crepitations were noted. Not using  accessory muscles of respiration.  CARDIOVASCULAR: S1, S2 normal. No murmurs, rubs, or gallops.  ABDOMEN: Soft, nontender, nondistended. Bowel sounds present. No organomegaly or mass.  EXTREMITIES: No pedal edema, cyanosis, or clubbing. Trace left lower extremity edema, but no calf tenderness, or cyanosis NEUROLOGIC: Cranial nerves II through XII are intact. Muscle strength 5/5 in all extremities. Sensation intact. Gait not checked.  PSYCHIATRIC: The patient is alert and oriented x 3. Somnolent and weak, somewhat withdrawn SKIN: No obvious rash, lesion, or ulcer.   ORDERS/RESULTS REVIEWED:   CBC  Recent Labs Lab 01/15/17 1231 01/16/17 0007 01/18/17 0414  WBC 16.4* 16.4* 11.5*  HGB 11.6* 11.2* 11.7*  HCT 33.7* 31.9* 34.2*  PLT 296 286 364  MCV 82.5 83.1 84.3  MCH 28.5 29.2 28.8  MCHC 34.6 35.1 34.1  RDW 14.2 14.4 14.4  LYMPHSABS 1.2  --   --   MONOABS 1.6*  --   --   EOSABS 0.0  --   --   BASOSABS 0.0  --   --    ------------------------------------------------------------------------------------------------------------------  Chemistries   Recent Labs  Lab 01/15/17 1231 01/16/17 0007 01/16/17 0415 01/17/17 0408 01/18/17 0414  NA 124* 126* 126*  127* 129*  K 2.7* 4.1 3.5 3.0* 3.7  CL 86* 91* 88* 88* 92*  CO2 26 24 27 29 27   GLUCOSE 271* 153* 238* 171* 132*  BUN 18 15 16 18 16   CREATININE 0.95 0.89 0.94 0.77 0.68  CALCIUM 8.5* 8.1* 7.9* 7.7* 8.0*  MG 1.7  --   --  1.7  1.8 1.9  AST 24  --   --   --   --   ALT 14  --   --   --   --   ALKPHOS 75  --   --   --   --   BILITOT 1.0  --   --   --   --    ------------------------------------------------------------------------------------------------------------------ estimated creatinine clearance is 48.1 mL/min (by C-G formula based on SCr of 0.68 mg/dL). ------------------------------------------------------------------------------------------------------------------ No results for input(s): TSH, T4TOTAL, T3FREE, THYROIDAB in the last 72 hours.  Invalid input(s): FREET3  Cardiac Enzymes  Recent Labs Lab 01/15/17 1231 01/16/17 0007  TROPONINI 0.03* 0.19*   ------------------------------------------------------------------------------------------------------------------ Invalid input(s): POCBNP ---------------------------------------------------------------------------------------------------------------  RADIOLOGY: US Venous Img Lower Unilateral Left  Result Date: 01/18/2017 CLINICAL DATA:  Left lower extremity swelling. EXAM: Left LOWER EXTREMITY VENOUS DOPPLER ULTRASOUND TECHNIQUE: Gray-scale sonography with graded compression, as well as color Doppler and duplex ultrasound were performed to evaluate the lower extremity deep venous systems from the level of the common femoral vein and including the common femoral, femoral, profunda femoral, popliteal and calf veins including the posterior tibial, peroneal and gastrocnemius veins when visible. The superficial great saphenous vein was also interrogated. Spectral Doppler was utilized to evaluate flow at rest and with distal augmentation maneuvers in the common femoral, femoral and popliteal veins. COMPARISON:  None. FINDINGS:  Contralateral Common Femoral Vein: Respiratory phasicity is normal and symmetric with the symptomatic side. No evidence of thrombus. Normal compressibility. Common Femoral Vein: No evidence of thrombus. Normal compressibility, respiratory phasicity and response to augmentation. Saphenofemoral Junction: No evidence of thrombus. Normal compressibility and flow on color Doppler imaging. Profunda Femoral Vein: No evidence of thrombus. Normal compressibility and flow on color Doppler imaging. Femoral Vein: No evidence of thrombus. Normal compressibility, respiratory phasicity and response to augmentation. Popliteal Vein: No evidence of thrombus. Normal compressibility, respiratory phasicity and response to augmentation. Calf Veins: No evidence of thrombus. Normal compressibility and flow on color Doppler imaging. Venous Reflux:  None. Other Findings: 3 cm Baker's cyst is noted in left popliteal fossa. Incidental note is made of large irregular and partially mobile plaque in the left common femoral artery resulting in moderate to severe stenosis. IMPRESSION: No evidence of DVT within the left lower extremity. 3 cm Baker's cyst seen in left popliteal fossa. Large irregular and partially mobile plaque is noted in the left common femoral artery resulting in moderate to severe stenosis. This could represent an embolic source into more distal arteries and vascular surgery consultation is recommended. Electronically Signed   By: Marijo Conception, M.D.   On: 01/18/2017 10:11   Dg Chest Port 1 View  Result Date: 01/18/2017 CLINICAL DATA:  Cough.  Recent diagnosis of pneumonia. EXAM: PORTABLE CHEST 1 VIEW COMPARISON:  01/15/2017 FINDINGS: Mild cardiomegaly persists. Aortic atherosclerosis again demonstrated. Patchy bilateral bronchopneumonia right worse than left shows improvement. No worsening or new findings. IMPRESSION: Radiographic improvement in patchy bilateral bronchopneumonia right more than left. Electronically Signed    By: Jan Fireman.D.  On: 01/18/2017 09:34    EKG:  Orders placed or performed during the hospital encounter of 01/15/17  . ED EKG  . ED EKG    ASSESSMENT AND PLAN:  Active Problems:   Sepsis (HCC)   Pneumonia   Leukocytosis   Hilar adenopathy   Hyponatremia   Hypokalemia   Generalized weakness   Demand ischemia ([place])  #1. Sepsis due to pneumonia, continue levofloxacin, clinically and radiologically improved, blood cultures are negative , unable to get sputum cultures, patient denied dysphagia symptoms.  #2. Right lower lobe pneumonia, unable to get sputum cultures ,  continue levofloxacin, clinically improved.  Fever has subsided, white blood cell count is improving, chest x-ray has improved #3. Hyponatremia, minimally improved with therapy, suspected HCTZ related,  urine osmolarity to rule out SIADH is pending #4. Hypokalemia, likely due to HCTZ, supplemented orally, normalized #5 elevated troponin, suspected demand ischemia, echocardiogram was normal, continue metoprolol, aspirin, Crestor. Myoview stress test in August 2016 was normal, normal ejection fraction, no further interventions, patient is asymptomatic #6. Leukocytosis, improved #7. Left lower extremity swelling, no DVT, but Doppler ultrasound revealed common femoral artery plaque, possibly mobile, vascular surgery consultation is obtained, discussed with Dr. Lucky Cowboy, CT angiogram with femoral run off is ordered, pending  Management plans discussed with the patient, family and they are in agreement.   DRUG ALLERGIES:  Allergies  Allergen Reactions  . Penicillins Rash    .pcn    CODE STATUS:     Code Status Orders        Start     Ordered   01/15/17 1651  Full code  Continuous     01/15/17 1650    Code Status History    Date Active Date Inactive Code Status Order ID Comments User Context   This patient has a current code status but no historical code status.    Advance Directive Documentation     Most  Recent Value  Type of Advance Directive  Healthcare Power of Attorney, Living will  Pre-existing out of facility DNR order (yellow form or pink MOST form)  -  "MOST" Form in Place?  -      TOTAL TIME TAKING CARE OF THIS PATIENT: 40 minutes.    Theodoro Grist M.D on 01/18/2017 at 4:19 PM  Between 7am to 6pm - Pager - [phone]  After 6pm go to [email or web address] - password [email or web address] [facility]  Office  [phone]  CC: Primary care physician; Einar Pheasant, MD

## 2017-01-18 NOTE — Care Management (Signed)
Admitted to this facility with the diagnosis of sepsis. Lives alone. Daughter is Mendy [phone]). Seen Dr. Einar Pheasant this past Tuesday. No home health. No skilled facility. No home oxygen. Grab bars and shower chair in the home. Takes care of all basic and instrumental activities of daily living herself, drives. Last fall was years ago. Appetite is getting better. Prescriptions are filled at [facility] in [place]. Family will transport, Physical therapy evaluation completed, Recommending Home Health Physical therapy, Discussed agencies. [place].  Sodium 129 today. 98.3 RA. Shelbie Ammons RN MSN CCM Care Management [phone]

## 2017-01-18 NOTE — Progress Notes (Signed)
Pharmacy Antibiotic Note  Tatum Katesha Eichel is a 83 y.o. female admitted on 01/15/2017 with pneumonia. Pharmacy has been consulted for levofloxacin dosing.  Plan: Patient is afebrile and taking other PO medications. Will continue levofloxacin 750 mg POq 48 hours based on renal function (CrCl <60ml/min). Progress notes indicated patient is clinically improving.  Height: 5\' 6"  (167.6 cm) Weight: 155 lb (70.3 kg) IBW/kg (Calculated) : 59.3  Temp (24hrs), Avg:98.1 F (36.7 C), Min:97.7 F (36.5 C), Max:98.3 F (36.8 C)   Recent Labs Lab 01/15/17 1231 01/16/17 0007 01/16/17 0415 01/17/17 0408 01/18/17 0414  WBC 16.4* 16.4*  --   --  11.5*  CREATININE 0.95 0.89 0.94 0.77 0.68    Estimated Creatinine Clearance: 48.1 mL/min (by C-G formula based on SCr of 0.68 mg/dL).    Allergies  Allergen Reactions  . Penicillins Rash    .pcn    Antimicrobials this admission: levofloxacin 5/29 >>   Dose adjustments this admission:  Microbiology results: 5/29 BCx: NGTD 5/30 MRSA PCR: negative  Thank you for allowing pharmacy to be a part of this patient's care.  Pernell Dupre, PharmD, BCPS Clinical Pharmacist 01/18/2017 9:42 AM

## 2017-01-18 NOTE — Consult Note (Signed)
[facility] Vascular Consult Note  MRN : [id number]  Debra Kirk is a 83 y.o. ([birth year]) female who presents with chief complaint of  Chief Complaint  Patient presents with  . Cough  . Shortness of Breath  .  History of Present Illness: I am asked by Dr. Ether Griffins To see the patient regarding an incidental finding of left femoral artery plaque on the ultrasound performed today. The ultrasound was a DVT study of the left leg as she was admitted with infectious issues and found to have some leg swelling. His leg swelling is reasonably mild. She has neuropathy in both legs. On further questioning she does describe some claudication symptoms on the left leg with fatigue and tiredness in the legs worse on the left than the right with activity. She gets some cramping in the calf and thigh area intermittently that is usually relieved with rest. She does not describe ischemic rest pain. She does not have any nonhealing ulcerations. She denies any previous vascular interventions or surgeries to her knowledge.  I have independently reviewed her ultrasound images from earlier today. He did not have a DVT in the venous system of the left lower extremity. A Baker cyst was seen in the left popliteal fossa. The official interpretation was of a possibly mobile and a regular plaque in the left common femoral artery. Although the plaque is quite irregular and bulky, I'm not sure I would call this mobile. This has more the appearance of a chronic calcified irregular plaque. Hemodynamics were not measured on this study and arterial duplex is not really available at our institution.  Current Facility-Administered Medications  Medication Dose Route Frequency Provider Last Rate Last Dose  . acetaminophen (TYLENOL) tablet 650 mg  650 mg Oral Q6H PRN Fritzi Mandes, MD       Or  . acetaminophen (TYLENOL) suppository 650 mg  650 mg Rectal Q6H PRN Fritzi Mandes, MD      . albuterol (PROVENTIL)  (2.5 MG/3ML) 0.083% nebulizer solution 2.5 mg  2.5 mg Nebulization Q4H Theodoro Grist, MD   2.5 mg at 01/18/17 1528  . aspirin EC tablet 81 mg  81 mg Oral Daily Fritzi Mandes, MD   81 mg at 01/18/17 1235  . calcium-vitamin D (OSCAL WITH D) 500-200 MG-UNIT per tablet 1 tablet  1 tablet Oral BID Fritzi Mandes, MD   1 tablet at 01/18/17 1235  . chlorpheniramine-HYDROcodone (TUSSIONEX) 10-8 MG/5ML suspension 5 mL  5 mL Oral Q12H Theodoro Grist, MD   5 mL at 01/18/17 1234  . codeine tablet 15 mg  15 mg Oral Q4H PRN Theodoro Grist, MD      . enoxaparin (LOVENOX) injection 40 mg  40 mg Subcutaneous Q24H Fritzi Mandes, MD   40 mg at 01/17/17 2147  . glimepiride (AMARYL) tablet 2 mg  2 mg Oral Q breakfast Fritzi Mandes, MD   2 mg at 01/17/17 0277  . guaiFENesin-codeine 100-10 MG/5ML solution 10 mL  10 mL Oral Q4H PRN Theodoro Grist, MD   10 mL at 01/17/17 1725  . insulin aspart (novoLOG) injection 0-9 Units  0-9 Units Subcutaneous TID WC Fritzi Mandes, MD   2 Units at 01/18/17 1234  . levofloxacin (LEVAQUIN) tablet 750 mg  750 mg Oral Q48H Hallaji, Sheema M, RPH   750 mg at 01/17/17 1724  . meclizine (ANTIVERT) tablet 12.5 mg  12.5 mg Oral BID PRN Fritzi Mandes, MD      . methylPREDNISolone sodium succinate (SOLU-MEDROL) 125 mg/2  mL injection 60 mg  60 mg Intravenous Q24H Theodoro Grist, MD   60 mg at 01/18/17 1235  . metoCLOPramide (REGLAN) tablet 5 mg  5 mg Oral BID Fritzi Mandes, MD   5 mg at 01/18/17 1235  . metoprolol tartrate (LOPRESSOR) tablet 75 mg  75 mg Oral BID Fritzi Mandes, MD   75 mg at 01/18/17 1235  . ondansetron (ZOFRAN) tablet 4 mg  4 mg Oral Q6H PRN Fritzi Mandes, MD       Or  . ondansetron [facility]) injection 4 mg  4 mg Intravenous Q6H PRN Fritzi Mandes, MD      . pantoprazole (PROTONIX) EC tablet 40 mg  40 mg Oral Daily Fritzi Mandes, MD   40 mg at 01/18/17 1235  . rosuvastatin (CRESTOR) tablet 5 mg  5 mg Oral Daily Fritzi Mandes, MD   5 mg at 01/18/17 1235  . sertraline (ZOLOFT) tablet 50 mg  50 mg Oral Daily  Fritzi Mandes, MD   50 mg at 01/18/17 1235  . traMADol (ULTRAM) tablet 50 mg  50 mg Oral Q6H PRN Fritzi Mandes, MD        Past Medical History:  Diagnosis Date  . Carotid artery disease ([place])   . Compression fracture    s/p fosamax, prolia  . Diabetes mellitus ([place])   . Femoral neuropathy    left, elevated CRP, ESR, negative FANA and ANCA  . Fibrocystic breast disease   . Hypercholesterolemia   . Hypertension     Past Surgical History:  Procedure Laterality Date  . APPENDECTOMY    . CHOLECYSTECTOMY    . HERNIA REPAIR    . INGUINAL HERNIA REPAIR     left x 1, right x 2  . OVARIAN CYST REMOVAL    . PARTIAL HYSTERECTOMY      Social History Social History  Substance Use Topics  . Smoking status: Former Smoker    Quit date: 08/20/1958  . Smokeless tobacco: Never Used  . Alcohol use No  No IV drug use  Family History Family History  Problem Relation Age of Onset  . Heart disease Father        myocardial infarction  . Heart disease Mother        myocardial infarction  . Diabetes Mother   . Congestive Heart Failure Mother   . Bone cancer Sister   . Diabetes Sister        x2  . Heart disease Sister        CABG  . Rheum arthritis Sister   . Breast cancer Neg Hx   . Colon cancer Neg Hx     Allergies  Allergen Reactions  . Penicillins Rash    .pcn     REVIEW OF SYSTEMS (Negative unless checked)  Constitutional: [] Weight loss  [] Fever  [] Chills Cardiac: [] Chest pain   [] Chest pressure   [] Palpitations   [] Shortness of breath when laying flat   [] Shortness of breath at rest   [] Shortness of breath with exertion. Vascular:  [x] Pain in legs with walking   [] Pain in legs at rest   [] Pain in legs when laying flat   [x] Claudication   [] Pain in feet when walking  [] Pain in feet at rest  [] Pain in feet when laying flat   [] History of DVT   [] Phlebitis   [x] Swelling in legs   [] Varicose veins   [] Non-healing ulcers Pulmonary:   [] Uses home oxygen   [] Productive cough    [] Hemoptysis   [] Wheeze  []   COPD   [] Asthma Neurologic:  [] Dizziness  [] Blackouts   [] Seizures   [] History of stroke   [] History of TIA  [] Aphasia   [] Temporary blindness   [] Dysphagia   [] Weakness or numbness in arms   [x] Weakness or numbness in legs Musculoskeletal:  [x] Arthritis   [] Joint swelling   [] Joint pain   [] Low back pain Hematologic:  [] Easy bruising  [] Easy bleeding   [] Hypercoagulable state   [] Anemic  [] Hepatitis Gastrointestinal:  [] Blood in stool   [] Vomiting blood  [] Gastroesophageal reflux/heartburn   [] Difficulty swallowing. Genitourinary:  [] Chronic kidney disease   [] Difficult urination  [] Frequent urination  [] Burning with urination   [] Blood in urine Skin:  [] Rashes   [] Ulcers   [] Wounds Psychological:  [] History of anxiety   []  History of major depression.  Physical Examination  Vitals:   01/18/17 0755 01/18/17 1147 01/18/17 1326 01/18/17 1527  BP:   (!) 156/76   Pulse:   88   Resp:   18   Temp:   98.1 F (36.7 C)   TempSrc:   Oral   SpO2: 91% 91% 92% 92%  Weight:      Height:       Body mass index is 25.02 kg/m. Gen:  WD/WN, NAD Head: [place]/AT, No temporalis wasting.  Ear/Nose/Throat: Hearing grossly intact, nares w/o erythema or drainage, oropharynx w/o Erythema/Exudate Eyes: Sclera non-icteric, conjunctiva clear Neck: Trachea midline.  No JVD.  Pulmonary:  Good air movement, respirations not labored, equal bilaterally.  Cardiac: RRR, normal S1, S2. Vascular:  Vessel Right Left  Radial Palpable Palpable  Ulnar Palpable Palpable  Brachial Palpable Palpable  Carotid Palpable, without bruit Palpable, without bruit  Aorta Not palpable N/A  Femoral Palpable Palpable  Popliteal 1+ Palpable Not Palpable  PT Palpable 1+ Palpable  DP 1+ Palpable 1+ Palpable   Gastrointestinal: soft, non-tender/non-distended.  Musculoskeletal: M/S 5/5 throughout.  Extremities without ischemic changes.  No deformity or atrophy. Mild left lower extremity edema. Neurologic:  Sensation grossly intact in extremities.  Symmetrical.  Speech is fluent. Motor exam as listed above. Psychiatric: Judgment intact, Mood & affect appropriate for pt's clinical situation. Dermatologic: No rashes or ulcers noted.  No cellulitis or open wounds.       CBC Lab Results  Component Value Date   WBC 11.5 (H) 01/18/2017   HGB 11.7 (L) 01/18/2017   HCT 34.2 (L) 01/18/2017   MCV 84.3 01/18/2017   PLT 364 01/18/2017    BMET    Component Value Date/Time   NA 129 (L) 01/18/2017 0414   K 3.7 01/18/2017 0414   K 3.7 10/26/2013 1159   CL 92 (L) 01/18/2017 0414   CO2 27 01/18/2017 0414   GLUCOSE 132 (H) 01/18/2017 0414   BUN 16 01/18/2017 0414   CREATININE 0.68 01/18/2017 0414   CALCIUM 8.0 (L) 01/18/2017 0414   GFRNONAA >60 01/18/2017 0414   GFRAA >60 01/18/2017 0414   Estimated Creatinine Clearance: 48.1 mL/min (by C-G formula based on SCr of 0.68 mg/dL).  COAG No results found for: INR, PROTIME  Radiology Dg Chest 2 View  Result Date: 01/15/2017 CLINICAL DATA:  Shortness of breath. EXAM: CHEST  2 VIEW COMPARISON:  03/23/2015 FINDINGS: There is asymmetric reticular density at the right base. Fullness of the right hilum which could reflect underlying adenopathy or mass. Background hyperinflation, suspected emphysema. Borderline cardiomegaly. Remote L1 compression fracture. IMPRESSION: Infection and/or atelectasis at the right base. Prominent right hilum which could reflect underlying mass or adenopathy. Recommend chest CT follow-up.  Electronically Signed   By: Monte Fantasia M.D.   On: 01/15/2017 12:59   Ct Chest W Contrast  Result Date: 01/15/2017 CLINICAL DATA:  Worsening shortness of breath over the last couple weeks, oxygen desaturation, possible pneumonia, abnormal chest radiograph EXAM: CT CHEST WITH CONTRAST TECHNIQUE: Multidetector CT imaging of the chest was performed during intravenous contrast administration. Sagittal and coronal MPR images reconstructed from  axial data set. CONTRAST:  32m ISOVUE-300 IOPAMIDOL (ISOVUE-300) INJECTION 61% IV COMPARISON:  06/04/2011 Radiographic correlation: Chest radiographs 01/15/2017 and 03/23/2015 FINDINGS: Cardiovascular: Atherosclerotic calcifications aorta, coronary arteries and proximal great vessels. Upper normal caliber ascending thoracic aorta 3.7 cm diameter. No pericardial effusion. Mediastinum/Nodes: Multiple mildly enlarged mediastinal and RIGHT hilar nodes. RIGHT superior hilar node 18 mm short axis image 76. Enlarged subcarinal node 13 mm short axis image 74. Enlarged precarinal lymph nodes 15 mm short axis image 69 and 11 mm short axis image 62. Numerous additional normal to mildly enlarged RIGHT paratracheal and superior mediastinal nodes. No axillary adenopathy. Base of cervical region unremarkable. Esophagus normal appearance. Lungs/Pleura: Central peribronchial thickening. RIGHT lower lobe infiltrate consistent with pneumonia. Mild tree-in-bud infiltrates in the RIGHT upper lobe and scattered in LEFT lung. Subsegmental atelectasis LEFT lower lobe. No pleural effusion or pneumothorax. No discrete pulmonary mass/nodule. Upper Abdomen: No definite focal upper abdominal abnormalities. Musculoskeletal: Probable vertebral hemangioma at T8 and potential additional vertebral hemangioma at T4 extending into RIGHT pedicle, unchanged. Superior endplate compression fracture of L1 with 25% anterior height loss, unchanged since 03/23/2015. IMPRESSION: Central peribronchial thickening with scattered infiltrates in both lungs greatest in RIGHT lower lobe consistent with pneumonia. RIGHT hilar and mediastinal adenopathy, potentially reactive in the setting of acute infiltrate; recommend follow-up CT chest with contrast in 3-4 months following resolution of the patient's acute process to exclude other causes of adenopathy including tumor and lymphoma. Aortic atherosclerosis and coronary artery calcification. Electronically Signed   By:  MLavonia DanaM.D.   On: 01/15/2017 14:56   UKoreaVenous Img Lower Unilateral Left  Result Date: 01/18/2017 CLINICAL DATA:  Left lower extremity swelling. EXAM: Left LOWER EXTREMITY VENOUS DOPPLER ULTRASOUND TECHNIQUE: Gray-scale sonography with graded compression, as well as color Doppler and duplex ultrasound were performed to evaluate the lower extremity deep venous systems from the level of the common femoral vein and including the common femoral, femoral, profunda femoral, popliteal and calf veins including the posterior tibial, peroneal and gastrocnemius veins when visible. The superficial great saphenous vein was also interrogated. Spectral Doppler was utilized to evaluate flow at rest and with distal augmentation maneuvers in the common femoral, femoral and popliteal veins. COMPARISON:  None. FINDINGS: Contralateral Common Femoral Vein: Respiratory phasicity is normal and symmetric with the symptomatic side. No evidence of thrombus. Normal compressibility. Common Femoral Vein: No evidence of thrombus. Normal compressibility, respiratory phasicity and response to augmentation. Saphenofemoral Junction: No evidence of thrombus. Normal compressibility and flow on color Doppler imaging. Profunda Femoral Vein: No evidence of thrombus. Normal compressibility and flow on color Doppler imaging. Femoral Vein: No evidence of thrombus. Normal compressibility, respiratory phasicity and response to augmentation. Popliteal Vein: No evidence of thrombus. Normal compressibility, respiratory phasicity and response to augmentation. Calf Veins: No evidence of thrombus. Normal compressibility and flow on color Doppler imaging. Venous Reflux:  None. Other Findings: 3 cm Baker's cyst is noted in left popliteal fossa. Incidental note is made of large irregular and partially mobile plaque in the left common femoral artery resulting in moderate to severe stenosis. IMPRESSION: No evidence  of DVT within the left lower extremity. 3 cm  Baker's cyst seen in left popliteal fossa. Large irregular and partially mobile plaque is noted in the left common femoral artery resulting in moderate to severe stenosis. This could represent an embolic source into more distal arteries and vascular surgery consultation is recommended. Electronically Signed   By: Marijo Conception, M.D.   On: 01/18/2017 10:11   Dg Chest Port 1 View  Result Date: 01/18/2017 CLINICAL DATA:  Cough.  Recent diagnosis of pneumonia. EXAM: PORTABLE CHEST 1 VIEW COMPARISON:  01/15/2017 FINDINGS: Mild cardiomegaly persists. Aortic atherosclerosis again demonstrated. Patchy bilateral bronchopneumonia right worse than left shows improvement. No worsening or new findings. IMPRESSION: Radiographic improvement in patchy bilateral bronchopneumonia right more than left. Electronically Signed   By: Nelson Chimes M.D.   On: 01/18/2017 09:34   Dg Chest Port 1 View  Result Date: 01/15/2017 CLINICAL DATA:  83 year old female with acute respiratory distress. EXAM: PORTABLE CHEST 1 VIEW COMPARISON:  Chest CT dated 01/15/2017 FINDINGS: Single portable view of the chest demonstrate a focal confluent areas of nodularity in the right upper lobe and at the right lung base corresponding to the density seen on the prior CT and most consistent with pneumonia. There is no focal consolidation, pleural effusion, or pneumothorax. Mild bronchiectatic changes and perihilar streaky densities noted. The cardiac silhouette is within normal limits. There is atherosclerotic calcification of the aortic arch. No acute osseous pathology. IMPRESSION: Confluent nodular densities predominantly involving the right lung consistent with pneumonia as seen on the earlier CT. Electronically Signed   By: Anner Crete M.D.   On: 01/15/2017 23:49      Assessment/Plan 1. Incidental finding of a left common femoral plaque on the left lower extremity venous duplex. I have independently reviewed her ultrasound images from  earlier today. He did not have a DVT in the venous system of the left lower extremity. A Baker cyst was seen in the left popliteal fossa. The official interpretation was of a possibly mobile and a regular plaque in the left common femoral artery. Although the plaque is quite irregular and bulky, I'm not sure I would call this mobile. This has more the appearance of a chronic calcified irregular plaque. Hemodynamics were not measured on this study and arterial duplex is not really available at our institution.  In questioning the patient she clearly does have some claudication symptoms in the left leg. This would indicate this is probably a hemodynamically significant lesion in the left femoral artery. I have ordered a CT angiogram for further evaluation particularly with the reading by the radiologist this was a mobile lesion which could indicate an embolic situation. That is unlikely given her clinical scenario. I discussed with her we would determine further treatment options once we have a little more information from the CT scan and this may just be medical management alone given the fact that her symptoms are not disabling at this point. 2. DM. Stable on outpatient medications and blood glucose control important in reducing the progression of atherosclerotic disease. Also, involved in wound healing. On appropriate medications. 3. HTN. Stable on outpatient medications and blood pressure control important in reducing the progression of atherosclerotic disease. On appropriate oral medications. 4. Pneumonia. Reason for admission.  Getting treatment and improving. 5. LLE swelling. May be caused or exacerbated by the Bakers cyst.  Not likely from PAD.   Leotis Pain, MD  01/18/2017 3:57 PM    This note was created with Viviann Spare  medical transcription system.  Any error is purely unintentional

## 2017-01-18 NOTE — Progress Notes (Signed)
MEDICATION RELATED CONSULT NOTE  Pharmacy Consult for electrolytes Indication: hypokalemia  Assessment: 6/1 0408 K = 3.7    Mg: 1.9   Goal of Therapy:  Electrolytes WNL  Plan:  No supplementation needed at this time. Will check BMP in 48 hours.    Allergies  Allergen Reactions  . Penicillins Rash    .pcn    Patient Measurements: Height: 5' 6" (167.6 cm) Weight: 155 lb (70.3 kg) IBW/kg (Calculated) : 59.3  Vital Signs: Temp: 98.3 F (36.8 C) (06/01 0405) BP: 148/67 (06/01 0405) Pulse Rate: 79 (06/01 0405) Intake/Output from previous day: 05/31 0701 - 06/01 0700 In: 240 [P.O.:240] Out: -  Intake/Output from this shift: No intake/output data recorded.  Labs:  Recent Labs  01/15/17 1231 01/16/17 0007 01/16/17 0415 01/17/17 0408 01/18/17 0414  WBC 16.4* 16.4*  --   --  11.5*  HGB 11.6* 11.2*  --   --  11.7*  HCT 33.7* 31.9*  --   --  34.2*  PLT 296 286  --   --  364  CREATININE 0.95 0.89 0.94 0.77 0.68  MG 1.7  --   --  1.7  1.8 1.9  ALBUMIN 3.2*  --   --   --   --   PROT 7.4  --   --   --   --   AST 24  --   --   --   --   ALT 14  --   --   --   --   ALKPHOS 75  --   --   --   --   BILITOT 1.0  --   --   --   --    Estimated Creatinine Clearance: 48.1 mL/min (by C-G formula based on SCr of 0.68 mg/dL).   Microbiology: Recent Results (from the past 720 hour(s))  Blood culture (routine x 2)     Status: None (Preliminary result)   Collection Time: 01/15/17  2:16 PM  Result Value Ref Range Status   Specimen Description BLOOD LEFT HAND  Final   Special Requests BOTTLES DRAWN AEROBIC AND ANAEROBIC BCAV  Final   Culture NO GROWTH 3 DAYS  Final   Report Status PENDING  Incomplete  Blood culture (routine x 2)     Status: None (Preliminary result)   Collection Time: 01/15/17  2:16 PM  Result Value Ref Range Status   Specimen Description BLOOD LEFT ANTECUBITAL  Final   Special Requests   Final    BOTTLES DRAWN AEROBIC AND ANAEROBIC Blood Culture adequate  volume   Culture NO GROWTH 3 DAYS  Final   Report Status PENDING  Incomplete  MRSA PCR Screening     Status: None   Collection Time: 01/16/17 12:11 AM  Result Value Ref Range Status   MRSA by PCR NEGATIVE NEGATIVE Final    Comment:        The GeneXpert MRSA Assay (FDA approved for NASAL specimens only), is one component of a comprehensive MRSA colonization surveillance program. It is not intended to diagnose MRSA infection nor to guide or monitor treatment for MRSA infections.     Medical History: Past Medical History:  Diagnosis Date  . Carotid artery disease ([facility])   . Compression fracture    s/p fosamax, prolia  . Diabetes mellitus ([facility])   . Femoral neuropathy    left, elevated CRP, ESR, negative FANA and ANCA  . Fibrocystic breast disease   . Hypercholesterolemia   . Hypertension       Medications:  Scheduled:  . albuterol  2.5 mg Nebulization Q4H  . aspirin EC  81 mg Oral Daily  . calcium-vitamin D  1 tablet Oral BID  . chlorpheniramine-HYDROcodone  5 mL Oral Q12H  . enoxaparin (LOVENOX) injection  40 mg Subcutaneous Q24H  . glimepiride  2 mg Oral Q breakfast  . insulin aspart  0-9 Units Subcutaneous TID WC  . levofloxacin  750 mg Oral Q48H  . methylPREDNISolone (SOLU-MEDROL) injection  60 mg Intravenous Q24H  . metoCLOPramide  5 mg Oral BID  . metoprolol tartrate  75 mg Oral BID  . pantoprazole  40 mg Oral Daily  . rosuvastatin  5 mg Oral Daily  . sertraline  50 mg Oral Daily   Infusions:   Sheema M Hallaji, PharmD, BCPS Clinical Pharmacist 01/18/2017 9:31 AM        

## 2017-01-18 NOTE — Progress Notes (Signed)
PT Cancellation Note  Patient Details Name: Debra Kirk MRN: [id number] DOB: 08/30/1929   Cancelled Treatment:    Reason Eval/Treat Not Completed: Other (comment).  Pt s/p L LE Venous US today and findings include: "No evidence of DVT within the left lower extremity. 3 cm Baker's cyst seen in left popliteal fossa.  Large irregular and partially mobile plaque is noted in the left common femoral artery resulting in moderate to severe stenosis. This could represent an embolic source into more distal arteries and vascular surgery consultation is recommended."  Vascular surgery consult noted in orders and pt now pending CT ANGIO AO + BIFEM.  Will hold PT at this time (will monitor for any changes to pt's plan of care) and re-attempt PT treatment at a later date/time as medically appropriate.  Leitha Bleak, PT 01/18/17, 3:25 PM [phone]

## 2017-01-18 NOTE — Care Management Important Message (Signed)
Important Message  Patient Details  Name: Debra Kirk MRN: [id number] Date of Birth: September 02, 1929   Medicare Important Message Given:  Yes    Shelbie Ammons, RN 01/18/2017, 8:15 AM

## 2017-01-19 LAB — SODIUM: Sodium: 131 mmol/L — ABNORMAL LOW (ref 135–145)

## 2017-01-19 LAB — GLUCOSE, CAPILLARY
GLUCOSE-CAPILLARY: 247 mg/dL — AB (ref 65–99)
Glucose-Capillary: 297 mg/dL — ABNORMAL HIGH (ref 65–99)
Glucose-Capillary: 319 mg/dL — ABNORMAL HIGH (ref 65–99)

## 2017-01-19 LAB — OSMOLALITY, URINE: Osmolality, Ur: 652 mOsm/kg (ref 300–900)

## 2017-01-19 NOTE — Care Management Note (Signed)
Case Management Note  Patient Details  Name: Debra Kirk MRN: [id number] Date of Birth: June 17, 1930  Subjective/Objective:      Debra Kirk chose [facility] as his home health services provider earlier this week to his weekday Case Manager. A referral for home Health PT and RN was called to [facility].             Action/Plan:   Expected Discharge Date:  01/19/17               Expected Discharge Plan:   01/19/17  In-House Referral:     Discharge planning Services     Post Acute Care Choice:   Yes Choice offered to:   Patient  DME Arranged:   NA DME Agency:   NA  HH Arranged:   PT, RN [facility] Agency:   [facility]  Status of Service:   Completed  If discussed at [place] of Stay Meetings, dates discussed:    Additional Comments:  Teghan Philbin A, RN 01/19/2017, 1:09 PM

## 2017-01-19 NOTE — Discharge Instructions (Signed)
[facility] RN and Physical Therapy

## 2017-01-19 NOTE — Progress Notes (Signed)
[facility] previously delivered nebulizer machine for pt in her room; pt's ride present for discharge; pt discharged via wheelchair by nursing to the visitor's entrance

## 2017-01-19 NOTE — Progress Notes (Signed)
MD order in [place] to discharge pt home with home health today; Care Management previously established home health services with [facility] for RN and PT services, [facility] to deliver nebulizer machine to [facility] today; verbally reviewed AVS with pt, Rxs given to pt; no questions voiced at this time; pt's discharge pending arrival of [place] with a nebulizer machine

## 2017-01-19 NOTE — Discharge Summary (Signed)
[facility] NAME: Debra Kirk    MR#:  [id number]  DATE OF BIRTH:  11/08/1929  DATE OF ADMISSION:  01/15/2017 ADMITTING PHYSICIAN: Fritzi Mandes, MD  DATE OF DISCHARGE: 01/19/2017  PRIMARY CARE PHYSICIAN: Einar Pheasant, MD    ADMISSION DIAGNOSIS:  Dyspnea [R06.00] Hypoxia [R09.02] Community acquired pneumonia, unspecified laterality [J18.9]  DISCHARGE DIAGNOSIS:  Active Problems:   Sepsis ([place])   Pneumonia   Leukocytosis   Hilar adenopathy   Hyponatremia   Hypokalemia   Generalized weakness   Demand ischemia ([place])   SECONDARY DIAGNOSIS:   Past Medical History:  Diagnosis Date  . Carotid artery disease ([place])   . Compression fracture    s/p fosamax, prolia  . Diabetes mellitus ([place])   . Femoral neuropathy    left, elevated CRP, ESR, negative FANA and ANCA  . Fibrocystic breast disease   . Hypercholesterolemia   . Hypertension     HOSPITAL COURSE:   83 year old female with past medical history of carotid artery disease, diabetes, hypertension, hyperlipidemia, neuropathy, who presented to the hospital due to shortness of breath, cough, congestion and hypoxia noted to have pneumonia.  1. Sepsis-patient met criteria admission given fever, tachycardia, elevated white cell count, and CT chest findings of bilateral pneumonia. -Patient was treated with IV fluids, IV antibiotics with Levaquin and has improved. Her cultures have been negative. Shortness of breath cough and congestion is also improved. She is now being discharged on oral course of Levaquin.  2. Pneumonia-this is worse of patient's sepsis. Patient's fever has subsided, her white cell count has normalized. Her chest x-ray is improved since admission. -She was treated with IV Levaquin and now being discharged on oral Levaquin.  3. Hyponatremia/hypokalemia-patient was given IV fluids, elecytrolytes have improved and normalized now, her HCTZ was held and she has  been taken off the HCTZ for now.   4. Elevated troponin-this was supply demand ischemia. Patient echocardiogram showed no evidence of any wall motion abnormalities.  5. Left lower extremity swelling-patient underwent Dopplers of the lower extremity which was negative for DVT. Although he DID SHOW A COMMON FEMORAL ARTERY PLAQUE. -A VASCULAR SURGERY CONSULT WAS OBTAINED. PATIENT UNDERWENT CT angiogram which showed hemodynamically significant stenosis of the common femoral artery left greater than right. Discussed with past surgery and no plans for urgent intervention at patient's symptoms are just mild claudication. He'll follow-up the patient is an outpatient. Patient will continue aspirin and statin.  6. GERD - pt. Will cont. Omeprazole.  7. Depression - pt. Will cont. Zoloft.   DISCHARGE CONDITIONS:   Stable.   CONSULTS OBTAINED:  Treatment Team:  Algernon Huxley, MD  DRUG ALLERGIES:   Allergies  Allergen Reactions  . Penicillins Rash    .pcn    DISCHARGE MEDICATIONS:   Allergies as of 01/19/2017      Reactions   Penicillins Rash   .pcn      Medication List    STOP taking these medications   hydrochlorothiazide 25 MG tablet Commonly known as:  HYDRODIURIL     TAKE these medications   albuterol (2.5 MG/3ML) 0.083% nebulizer solution Commonly known as:  PROVENTIL Take 3 mLs (2.5 mg total) by nebulization every 4 (four) hours.   amLODipine 5 MG tablet Commonly known as:  NORVASC TAKE 1 TABLET BY MOUTH DAILY   aspirin 81 MG tablet Take 81 mg by mouth daily.   BAYER CONTOUR TEST test strip Generic drug:  glucose blood CHECK  DAILY AND AS NEEDED **E11.9**   budesonide-formoterol 160-4.5 MCG/ACT inhaler Commonly known as:  SYMBICORT Inhale 2 puffs into the lungs 2 (two) times daily.   CALCIUM 600+D 600-400 MG-UNIT tablet Generic drug:  Calcium Carbonate-Vitamin D Take 1 tablet by mouth 2 (two) times daily.   chlorpheniramine-HYDROcodone 10-8 MG/5ML Suer Commonly  known as:  TUSSIONEX Take 5 mLs by mouth every 12 (twelve) hours.   denosumab 60 MG/ML Soln injection Commonly known as:  PROLIA Inject 60 mg into the skin every 6 (six) months. Administer in upper arm, thigh, or abdomen   glimepiride 2 MG tablet Commonly known as:  AMARYL TAKE 1 TABLET BY MOUTH EVERY DAY WITH BREAKFAST   guaiFENesin-codeine 100-10 MG/5ML syrup Take 10 mLs by mouth every 4 (four) hours as needed for cough.   levofloxacin 750 MG tablet Commonly known as:  LEVAQUIN Take 1 tablet (750 mg total) by mouth every other day.   meclizine 12.5 MG tablet Commonly known as:  ANTIVERT Take 1 tablet (12.5 mg total) by mouth 2 (two) times daily as needed for dizziness.   metoCLOPramide 5 MG tablet Commonly known as:  REGLAN Take 5 mg by mouth 2 (two) times daily.   metoprolol tartrate 50 MG tablet Commonly known as:  LOPRESSOR TAKE 1 AND 1/2 TABLET BY MOUTH TWICE A DAY   omeprazole 20 MG capsule Commonly known as:  PRILOSEC TAKE ONE CAPSULE BY MOUTH TWICE A DAY   predniSONE 10 MG tablet Commonly known as:  DELTASONE Take 1 tablet (10 mg total) by mouth daily with breakfast. Please take 6 pills in the morning on the day 1, then taper by one pill daily until finished, thank you   rosuvastatin 5 MG tablet Commonly known as:  CRESTOR TAKE 1 TABLET (5 MG TOTAL) BY MOUTH DAILY.   sertraline 50 MG tablet Commonly known as:  ZOLOFT TAKE 1 TABLET (50 MG TOTAL) BY MOUTH DAILY.         DISCHARGE INSTRUCTIONS:   DIET:  Cardiac diet  DISCHARGE CONDITION:  Stable  ACTIVITY:  Activity as tolerated  OXYGEN:  Home Oxygen: No.   Oxygen Delivery: room air  DISCHARGE LOCATION:  Home with Home health PT.     If you experience worsening of your admission symptoms, develop shortness of breath, life threatening emergency, suicidal or homicidal thoughts you must seek medical attention immediately by calling 911 or calling your MD immediately  if symptoms less  severe.  You Must read complete instructions/literature along with all the possible adverse reactions/side effects for all the Medicines you take and that have been prescribed to you. Take any new Medicines after you have completely understood and accpet all the possible adverse reactions/side effects.   Please note  You were cared for by a hospitalist during your hospital stay. If you have any questions about your discharge medications or the care you received while you were in the hospital after you are discharged, you can call the unit and asked to speak with the hospitalist on call if the hospitalist that took care of you is not available. Once you are discharged, your primary care physician will handle any further medical issues. Please note that NO REFILLS for any discharge medications will be authorized once you are discharged, as it is imperative that you return to your primary care physician (or establish a relationship with a primary care physician if you do not have one) for your aftercare needs so that they can reassess your need for medications  and monitor your lab values.     Today   Resp. Status has improved.  No other complaints presently.  Mild Left leg pain but improved.  Daughter at bedside.   VITAL SIGNS:  Blood pressure (!) 147/69, pulse 87, temperature 97.8 F (36.6 C), temperature source Oral, resp. rate 20, height 5' 6" (1.676 m), weight 70.3 kg (155 lb), SpO2 92 %.  I/O:   Intake/Output Summary (Last 24 hours) at 01/19/17 1251 Last data filed at 01/19/17 1130  Gross per 24 hour  Intake             1600 ml  Output                0 ml  Net             1600 ml    PHYSICAL EXAMINATION:  GENERAL:  83 y.o.-year-old patient lying in the bed in no acute distress.  EYES: Pupils equal, round, reactive to light and accommodation. No scleral icterus. Extraocular muscles intact.  HEENT: Head atraumatic, normocephalic. Oropharynx and nasopharynx clear.  NECK:  Supple, no  jugular venous distention. No thyroid enlargement, no tenderness.  LUNGS: Normal breath sounds bilaterally, no wheezing, rales,rhonchi. No use of accessory muscles of respiration.  CARDIOVASCULAR: S1, S2 normal. No murmurs, rubs, or gallops.  ABDOMEN: Soft, non-tender, non-distended. Bowel sounds present. No organomegaly or mass.  EXTREMITIES: No pedal edema, cyanosis, or clubbing.  NEUROLOGIC: Cranial nerves II through XII are intact. No focal motor or sensory defecits b/l.  PSYCHIATRIC: The patient is alert and oriented x 3. Good affect.  SKIN: No obvious rash, lesion, or ulcer.   DATA REVIEW:   CBC  Recent Labs Lab 01/18/17 0414  WBC 11.5*  HGB 11.7*  HCT 34.2*  PLT 364    Chemistries   Recent Labs Lab 01/15/17 1231  01/18/17 0414 01/19/17 0419  NA 124*  < > 129* 131*  K 2.7*  < > 3.7  --   CL 86*  < > 92*  --   CO2 26  < > 27  --   GLUCOSE 271*  < > 132*  --   BUN 18  < > 16  --   CREATININE 0.95  < > 0.68  --   CALCIUM 8.5*  < > 8.0*  --   MG 1.7  < > 1.9  --   AST 24  --   --   --   ALT 14  --   --   --   ALKPHOS 75  --   --   --   BILITOT 1.0  --   --   --   < > = values in this interval not displayed.  Cardiac Enzymes  Recent Labs Lab 01/16/17 0007  TROPONINI 0.19*    Microbiology Results  Results for orders placed or performed during the hospital encounter of 01/15/17  Blood culture (routine x 2)     Status: None (Preliminary result)   Collection Time: 01/15/17  2:16 PM  Result Value Ref Range Status   Specimen Description BLOOD LEFT HAND  Final   Special Requests BOTTLES DRAWN AEROBIC AND ANAEROBIC BCAV  Final   Culture NO GROWTH 4 DAYS  Final   Report Status PENDING  Incomplete  Blood culture (routine x 2)     Status: None (Preliminary result)   Collection Time: 01/15/17  2:16 PM  Result Value Ref Range Status   Specimen Description BLOOD LEFT ANTECUBITAL  Final  Special Requests   Final    BOTTLES DRAWN AEROBIC AND ANAEROBIC Blood Culture  adequate volume   Culture NO GROWTH 4 DAYS  Final   Report Status PENDING  Incomplete  MRSA PCR Screening     Status: None   Collection Time: 01/16/17 12:11 AM  Result Value Ref Range Status   MRSA by PCR NEGATIVE NEGATIVE Final    Comment:        The GeneXpert MRSA Assay (FDA approved for NASAL specimens only), is one component of a comprehensive MRSA colonization surveillance program. It is not intended to diagnose MRSA infection nor to guide or monitor treatment for MRSA infections.     RADIOLOGY:  Ct Angio Ao+bifem W & Or Wo Contrast  Result Date: 01/18/2017 CLINICAL DATA:  Incidental finding of atherosclerotic plaque involving the left femoral artery on DVT ultrasound. Evaluate for PAD. EXAM: CT ANGIOGRAPHY OF ABDOMINAL AORTA WITH ILIOFEMORAL RUNOFF TECHNIQUE: Multidetector CT imaging of the abdomen, pelvis and lower extremities was performed using the standard protocol during bolus administration of intravenous contrast. Multiplanar CT image reconstructions and MIPs were obtained to evaluate the vascular anatomy. CONTRAST:  125 cc Isovue 370 COMPARISON:  CT abdomen and pelvis - 03/27/2011 ; left lower extremity venous Doppler ultrasound - earlier same day ; chest CT -01/15/2017 FINDINGS: VASCULAR Aorta: Moderate amount of eccentric mixed calcified and noncalcified atherosclerotic plaque within a tortuous but normal caliber abdominal aorta, not resulting in hemodynamically significant stenosis. No abdominal aortic dissection or periaortic stranding. Celiac: There is a minimal amount of eccentric mixed calcified and noncalcified atherosclerotic plaque involving the cranial aspect of the origin the celiac artery, not resulting in hemodynamically significant stenosis. Conventional branching pattern. SMA: There is eccentric mixed calcified and noncalcified atherosclerotic plaque involving the origin and proximal aspect of the SMA which approaches approximately 50% luminal narrowing  (representative axial image 42, series 4; coronal image 98, series 6). Conventional branching pattern. The distal tributaries of the SMA are widely patent without discrete intraluminal filling defect suggest distal embolism. Renals: Solitary bilaterally there is a minimal amount of eccentric mixed calcified and noncalcified atherosclerotic plaque involving the origin of the bilateral renal arteries, not definitely resulting in a hemodynamically significant stenosis. IMA: Remains patent. RIGHT Lower Extremity Inflow: The right common, external and internal iliac arteries are mildly diseased and tortuous though patent without hemodynamically significant stenosis and of normal caliber. Outflow: There is a moderate to large amount of eccentric mixed calcified and noncalcified atherosclerotic plaque within the right common femoral artery (representative images 127 and 131, series 4) which is approaches approximately 50% luminal narrowing. The right superficial and deep femoral arteries are widely patent without hemodynamically significant stenosis. Both the right above and below-knee popliteal arteries are widely patent without hemodynamically significant stenosis. Runoff: Three-vessel runoff to the right lower leg and foot. The right-sided dorsalis pedis artery is patent to the level of the forefoot. LEFT Lower Extremity Inflow: The left common, external and internal iliac arteries are mildly diseased and tortuous though patent without hemodynamically significant stenosis and of normal caliber. Outflow: There is a large amount of eccentric mixed calcified and noncalcified atherosclerotic plaque within the left common femoral artery (representative images 132 and 136, series 4), compatible with the findings seen on preceding DVT ultrasound and likely resulting in at least 50% luminal narrowing. The left superficial and deep femoral arteries are widely patent. Both the left above and below-knee popliteal arteries are  widely patent. Runoff: Three-vessel runoff to the left lower leg  and foot. The left-sided dorsalis pedis artery is patent to the level of the forefoot. Veins: The IVC and pelvic venous system appears widely patent on this arterial phase examination. Review of the MIP images confirms the above findings. NON-VASCULAR Evaluation of abdominal organs is limited to the arterial phase of enhancement. Lower chest: Limited visualization of lower thorax demonstrates consolidative opacities within in the posteromedial and lateral basilar aspects of the right lower lobe with associated air bronchograms, progressed compared to the 01/15/2017 examination. No pleural effusion. Cardiomegaly. Coronary artery calcifications. No pericardial effusion. Hepatobiliary: Normal hepatic contour. No discrete hyperenhancing hepatic lesions. Post cholecystectomy. No intra extrahepatic bili duct dilatation. No ascites. Pancreas: Normal appearance of the pancreas Spleen: Normal appearance of the spleen. Adrenals/Urinary Tract: There is symmetric enhancement of the bilateral kidneys. No definite renal stones this postcontrast examination. No discrete renal lesions. No urine obstruction. There is a minimal amount of grossly symmetric bilateral perinephric stranding, likely age and body habitus related. No urine obstruction. Normal appearance the bilateral adrenal glands. Normal appearance of the urinary bladder given degree distention. Stomach/Bowel: Rather extensive colonic diverticulosis without evidence of diverticulitis. Normal appearance of the terminal ileum. The appendix is not visualized, however there is no pericecal inflammatory change. No pneumoperitoneum, pneumatosis or portal venous gas. Lymphatic: No bulky retroperitoneal, mesenteric, pelvic or inguinal lymphadenopathy. Reproductive: Post hysterectomy. No discrete adnexal lesion. No free fluid the pelvic cul-de-sac. Other: Post right lower ventral wall hernia repair without evidence  of recurrence. Musculoskeletal: No acute or aggressive osseous abnormalities. Old moderate (approximately 40%) compression deformity involving the superior endplate of the L1 vertebral body, similar to the 03/2011 exam. Moderate severe multilevel lumbar spine DDD, worse at L4-L5 and L5-S1 with disc space height loss, endplate irregularity and sclerosis. Bone islands are incidentally noted within the left acetabulum and right femoral head. IMPRESSION: VASCULAR 1. Moderate amount of mixed calcified and noncalcified atherosclerotic plaque within a normal caliber abdominal aorta. Aortic Atherosclerosis (ICD10-I70.0). 2. Suspected hemodynamically significant stenoses involving the bilateral common femoral arteries, left greater than right. 3. Three-vessel runoff to the bilateral lower legs and feet with dorsalis pedis artery is patent to the level of the forefeet bilaterally. 4. Suspected hemodynamically significant narrowing involving the proximal SMA, however both the celiac and IMA are widely patent without hemodynamically significant stenosis. 5. Coronary artery calcifications. NON-VASCULAR 1. Worsening right basilar consolidative opacities with associated air bronchograms, progressed compared to the 01/15/2017 examination and potentially indicative of worsening infection and/or aspiration. Clinical correlation is advised. 2. Extensive colonic diverticulosis without evidence of diverticulitis. Electronically Signed   By: Sandi Mariscal M.D.   On: 01/18/2017 16:59   US Venous Img Lower Unilateral Left  Result Date: 01/18/2017 CLINICAL DATA:  Left lower extremity swelling. EXAM: Left LOWER EXTREMITY VENOUS DOPPLER ULTRASOUND TECHNIQUE: Gray-scale sonography with graded compression, as well as color Doppler and duplex ultrasound were performed to evaluate the lower extremity deep venous systems from the level of the common femoral vein and including the common femoral, femoral, profunda femoral, popliteal and calf veins  including the posterior tibial, peroneal and gastrocnemius veins when visible. The superficial great saphenous vein was also interrogated. Spectral Doppler was utilized to evaluate flow at rest and with distal augmentation maneuvers in the common femoral, femoral and popliteal veins. COMPARISON:  None. FINDINGS: Contralateral Common Femoral Vein: Respiratory phasicity is normal and symmetric with the symptomatic side. No evidence of thrombus. Normal compressibility. Common Femoral Vein: No evidence of thrombus. Normal compressibility, respiratory phasicity and response to augmentation.  Saphenofemoral Junction: No evidence of thrombus. Normal compressibility and flow on color Doppler imaging. Profunda Femoral Vein: No evidence of thrombus. Normal compressibility and flow on color Doppler imaging. Femoral Vein: No evidence of thrombus. Normal compressibility, respiratory phasicity and response to augmentation. Popliteal Vein: No evidence of thrombus. Normal compressibility, respiratory phasicity and response to augmentation. Calf Veins: No evidence of thrombus. Normal compressibility and flow on color Doppler imaging. Venous Reflux:  None. Other Findings: 3 cm Baker's cyst is noted in left popliteal fossa. Incidental note is made of large irregular and partially mobile plaque in the left common femoral artery resulting in moderate to severe stenosis. IMPRESSION: No evidence of DVT within the left lower extremity. 3 cm Baker's cyst seen in left popliteal fossa. Large irregular and partially mobile plaque is noted in the left common femoral artery resulting in moderate to severe stenosis. This could represent an embolic source into more distal arteries and vascular surgery consultation is recommended. Electronically Signed   By: Marijo Conception, M.D.   On: 01/18/2017 10:11   Dg Chest Port 1 View  Result Date: 01/18/2017 CLINICAL DATA:  Cough.  Recent diagnosis of pneumonia. EXAM: PORTABLE CHEST 1 VIEW COMPARISON:   01/15/2017 FINDINGS: Mild cardiomegaly persists. Aortic atherosclerosis again demonstrated. Patchy bilateral bronchopneumonia right worse than left shows improvement. No worsening or new findings. IMPRESSION: Radiographic improvement in patchy bilateral bronchopneumonia right more than left. Electronically Signed   By: Nelson Chimes M.D.   On: 01/18/2017 09:34      Management plans discussed with the patient, family and they are in agreement.  CODE STATUS:     Code Status Orders        Start     Ordered   01/15/17 1651  Full code  Continuous     01/15/17 1650    Code Status History    Date Active Date Inactive Code Status Order ID Comments User Context   This patient has a current code status but no historical code status.    Advance Directive Documentation     Most Recent Value  Type of Advance Directive  Healthcare Power of Attorney, Living will  Pre-existing out of facility DNR order (yellow form or pink MOST form)  -  "MOST" Form in Place?  -      TOTAL TIME TAKING CARE OF THIS PATIENT: 40 minutes.    Henreitta Leber M.D on 01/19/2017 at 12:51 PM  Between 7am to 6pm - Pager - [phone]  After 6pm go to [email or web address] - Proofreader  [facility]  Office  [phone]  CC: Primary care physician; Einar Pheasant, MD

## 2017-01-20 LAB — CULTURE, BLOOD (ROUTINE X 2)
CULTURE: NO GROWTH
Culture: NO GROWTH
SPECIAL REQUESTS: ADEQUATE

## 2017-01-20 MED ORDER — ALBUTEROL SULFATE (2.5 MG/3ML) 0.083% IN NEBU: 2.5000 mg | INHALATION_SOLUTION | RESPIRATORY_TRACT | 1 refills | Status: DC

## 2017-01-21 ENCOUNTER — Telehealth: Payer: Self-pay | Admitting: Internal Medicine

## 2017-01-21 DIAGNOSIS — F329 Major depressive disorder, single episode, unspecified: Secondary | ICD-10-CM | POA: Diagnosis not present

## 2017-01-21 DIAGNOSIS — J189 Pneumonia, unspecified organism: Secondary | ICD-10-CM | POA: Diagnosis not present

## 2017-01-21 DIAGNOSIS — I248 Other forms of acute ischemic heart disease: Secondary | ICD-10-CM | POA: Diagnosis not present

## 2017-01-21 DIAGNOSIS — I70212 Atherosclerosis of native arteries of extremities with intermittent claudication, left leg: Secondary | ICD-10-CM | POA: Diagnosis not present

## 2017-01-21 DIAGNOSIS — Z7984 Long term (current) use of oral hypoglycemic drugs: Secondary | ICD-10-CM | POA: Diagnosis not present

## 2017-01-21 DIAGNOSIS — M6281 Muscle weakness (generalized): Secondary | ICD-10-CM | POA: Diagnosis not present

## 2017-01-21 DIAGNOSIS — K219 Gastro-esophageal reflux disease without esophagitis: Secondary | ICD-10-CM | POA: Diagnosis not present

## 2017-01-21 DIAGNOSIS — Z7982 Long term (current) use of aspirin: Secondary | ICD-10-CM | POA: Diagnosis not present

## 2017-01-21 DIAGNOSIS — I10 Essential (primary) hypertension: Secondary | ICD-10-CM | POA: Diagnosis not present

## 2017-01-21 DIAGNOSIS — E1142 Type 2 diabetes mellitus with diabetic polyneuropathy: Secondary | ICD-10-CM | POA: Diagnosis not present

## 2017-01-21 DIAGNOSIS — I2510 Atherosclerotic heart disease of native coronary artery without angina pectoris: Secondary | ICD-10-CM | POA: Diagnosis not present

## 2017-01-21 NOTE — Telephone Encounter (Signed)
She is already scheduled Caryl Pina helped me out.

## 2017-01-21 NOTE — Telephone Encounter (Signed)
Transition Care Management Follow-up Telephone Call  How have you been since you were released from the hospital? Feels like she is getting her strength  Back per DPR (daughter)   Do you understand why you were in the hospital? Yes, Pneumonia   Do you understand the discharge instrcutions? Yes.  Items Reviewed:  Medications reviewed:Yes  Allergies reviewed: Yes  Dietary changes reviewed: Yes  Referrals reviewed:Yes   Functional Questionnaire:   Activities of Daily Living (ADLs):   She states they are independent in the following:Patient is able to perform ADLS. States they require assistance with the following: No assist needed at this time.   Any transportation issues/concerns?: No   Any patient concerns? No   Confirmed importance and date/time of follow-up visits scheduled: Yes  Confirmed with patient if condition begins to worsen call PCP or go to the ER.  Patient was given the Call-a-Nurse line [phone]: Yes.

## 2017-01-21 NOTE — Telephone Encounter (Signed)
Pt daughter Lynelle Smoke called needing to make a HFU for pno. Please advise, thank you!  Call Tammy [phone]

## 2017-01-21 NOTE — Telephone Encounter (Signed)
The only spot that I can put her is 01/25/17 at 12:30 - work in during lunch.  Let me know if this is a problem.

## 2017-01-22 NOTE — Telephone Encounter (Signed)
Lmov for patient to call back and schedule appt °

## 2017-01-23 ENCOUNTER — Ambulatory Visit (INDEPENDENT_AMBULATORY_CARE_PROVIDER_SITE_OTHER): Payer: Medicare Other | Admitting: Internal Medicine

## 2017-01-23 ENCOUNTER — Encounter: Payer: Self-pay | Admitting: Internal Medicine

## 2017-01-23 VITALS — BP 140/70 | HR 72 | Temp 98.2°F | Resp 12 | Ht 66.0 in | Wt 155.2 lb

## 2017-01-23 DIAGNOSIS — I10 Essential (primary) hypertension: Secondary | ICD-10-CM

## 2017-01-23 DIAGNOSIS — E871 Hypo-osmolality and hyponatremia: Secondary | ICD-10-CM | POA: Diagnosis not present

## 2017-01-23 DIAGNOSIS — E7800 Pure hypercholesterolemia, unspecified: Secondary | ICD-10-CM | POA: Diagnosis not present

## 2017-01-23 DIAGNOSIS — E1140 Type 2 diabetes mellitus with diabetic neuropathy, unspecified: Secondary | ICD-10-CM | POA: Diagnosis not present

## 2017-01-23 DIAGNOSIS — A419 Sepsis, unspecified organism: Secondary | ICD-10-CM | POA: Diagnosis not present

## 2017-01-23 DIAGNOSIS — K3184 Gastroparesis: Secondary | ICD-10-CM

## 2017-01-23 DIAGNOSIS — I2510 Atherosclerotic heart disease of native coronary artery without angina pectoris: Secondary | ICD-10-CM | POA: Diagnosis not present

## 2017-01-23 DIAGNOSIS — R0602 Shortness of breath: Secondary | ICD-10-CM

## 2017-01-23 DIAGNOSIS — M6281 Muscle weakness (generalized): Secondary | ICD-10-CM | POA: Diagnosis not present

## 2017-01-23 DIAGNOSIS — J189 Pneumonia, unspecified organism: Secondary | ICD-10-CM

## 2017-01-23 DIAGNOSIS — E1142 Type 2 diabetes mellitus with diabetic polyneuropathy: Secondary | ICD-10-CM | POA: Diagnosis not present

## 2017-01-23 DIAGNOSIS — I70212 Atherosclerosis of native arteries of extremities with intermittent claudication, left leg: Secondary | ICD-10-CM | POA: Diagnosis not present

## 2017-01-23 DIAGNOSIS — E119 Type 2 diabetes mellitus without complications: Secondary | ICD-10-CM

## 2017-01-23 DIAGNOSIS — I248 Other forms of acute ischemic heart disease: Secondary | ICD-10-CM | POA: Diagnosis not present

## 2017-01-23 LAB — BASIC METABOLIC PANEL
BUN: 26 mg/dL — ABNORMAL HIGH (ref 6–23)
CO2: 28 meq/L (ref 19–32)
Calcium: 9.3 mg/dL (ref 8.4–10.5)
Chloride: 90 mEq/L — ABNORMAL LOW (ref 96–112)
Creatinine, Ser: 0.94 mg/dL (ref 0.40–1.20)
GFR: 60.12 mL/min (ref >60.00)
GLUCOSE: 426 mg/dL — AB (ref 70–99)
POTASSIUM: 4.9 meq/L (ref 3.5–5.1)
SODIUM: 124 meq/L — AB (ref 135–145)

## 2017-01-23 NOTE — Progress Notes (Signed)
Pre-visit discussion using our clinic review tool. No additional management support is needed unless otherwise documented below in the visit note.  

## 2017-01-23 NOTE — Progress Notes (Signed)
Patient ID: Debra Kirk, female   DOB: [birth year], 83 y.o.   MRN: [id number]   Subjective:    Patient ID: Debra Kirk, female    DOB: 09-Aug-1930, 84 y.o.   MRN: [id number]  HPI  Patient here for hospital follow up.  She is accompanied by her daughter.  History obtained from both of them.  She was admitted 01/15/17 with bilateral pneumonia and sepsis.  She was treated with IV fluids, IV antibiotics.  CXR improved some prior to discharge.  Changed to oral levaquin.  Was found to be hyponatremic and hypokalemic.  Taken off hctz.  Was given IVFs.  Found to have elevated troponin.  Felt to be demand ischemia.  ECHO - no wall motion abnormality.  Had lower extremity swelling.  Had ultrasound that revealed no DVT,  Did have a common femoral artey plaque.  Had CT angiogram as outlined.  Vascular surgery evaluated.  Recommended outpatient follow up.  Has f/u planned next week.  She reports she feels better.  Breathing better.  Eating.  Sugars elevated.  No chest pain.  No nausea or vomiting.  Bowels stable.     Past Medical History:  Diagnosis Date  . Carotid artery disease ([place])   . Compression fracture    s/p fosamax, prolia  . Diabetes mellitus ([place])   . Femoral neuropathy    left, elevated CRP, ESR, negative FANA and ANCA  . Fibrocystic breast disease   . Hypercholesterolemia   . Hypertension    Past Surgical History:  Procedure Laterality Date  . APPENDECTOMY    . CHOLECYSTECTOMY    . HERNIA REPAIR    . INGUINAL HERNIA REPAIR     left x 1, right x 2  . OVARIAN CYST REMOVAL    . PARTIAL HYSTERECTOMY     Family History  Problem Relation Age of Onset  . Heart disease Father        myocardial infarction  . Heart disease Mother        myocardial infarction  . Diabetes Mother   . Congestive Heart Failure Mother   . Bone cancer Sister   . Diabetes Sister        x2  . Heart disease Sister        CABG  . Rheum arthritis Sister   . Breast cancer Neg Hx   . Colon cancer Neg  Hx    Social History   Social History  . Marital status: Widowed    Spouse name: N/A  . Number of children: 3  . Years of education: N/A   Social History Main Topics  . Smoking status: Former Smoker    Quit date: 08/20/1958  . Smokeless tobacco: Never Used  . Alcohol use No  . Drug use: No  . Sexual activity: No   Other Topics Concern  . None   Social History Narrative  . None    Outpatient Encounter Prescriptions as of 01/23/2017  Medication Sig  . albuterol (PROVENTIL) (2.5 MG/3ML) 0.083% nebulizer solution Take 3 mLs (2.5 mg total) by nebulization every 4 (four) hours.  Marland Kitchen amLODipine (NORVASC) 5 MG tablet TAKE 1 TABLET BY MOUTH DAILY  . aspirin 81 MG tablet Take 81 mg by mouth daily.  Marland Kitchen BAYER CONTOUR TEST test strip CHECK DAILY AND AS NEEDED **E11.9**  . budesonide-formoterol (SYMBICORT) 160-4.5 MCG/ACT inhaler Inhale 2 puffs into the lungs 2 (two) times daily.  . Calcium Carbonate-Vitamin D (CALCIUM 600+D) 600-400 MG-UNIT per tablet Take 1 tablet  by mouth 2 (two) times daily.  . chlorpheniramine-HYDROcodone (TUSSIONEX) 10-8 MG/5ML SUER Take 5 mLs by mouth every 12 (twelve) hours.  Marland Kitchen denosumab (PROLIA) 60 MG/ML SOLN injection Inject 60 mg into the skin every 6 (six) months. Administer in upper arm, thigh, or abdomen  . glimepiride (AMARYL) 2 MG tablet TAKE 1 TABLET BY MOUTH EVERY DAY WITH BREAKFAST  . guaiFENesin-codeine 100-10 MG/5ML syrup Take 10 mLs by mouth every 4 (four) hours as needed for cough.  Marland Kitchen levofloxacin (LEVAQUIN) 750 MG tablet Take 1 tablet (750 mg total) by mouth every other day.  . meclizine (ANTIVERT) 12.5 MG tablet Take 1 tablet (12.5 mg total) by mouth 2 (two) times daily as needed for dizziness.  . metoCLOPramide (REGLAN) 5 MG tablet Take 5 mg by mouth 2 (two) times daily.   . metoprolol (LOPRESSOR) 50 MG tablet TAKE 1 AND 1/2 TABLET BY MOUTH TWICE A DAY  . omeprazole (PRILOSEC) 20 MG capsule TAKE ONE CAPSULE BY MOUTH TWICE A DAY  . predniSONE (DELTASONE)  10 MG tablet Take 1 tablet (10 mg total) by mouth daily with breakfast. Please take 6 pills in the morning on the day 1, then taper by one pill daily until finished, thank you  . rosuvastatin (CRESTOR) 5 MG tablet TAKE 1 TABLET (5 MG TOTAL) BY MOUTH DAILY.  Marland Kitchen sertraline (ZOLOFT) 50 MG tablet TAKE 1 TABLET (50 MG TOTAL) BY MOUTH DAILY. (Patient not taking: Reported on 01/24/2017)   No facility-administered encounter medications on file as of 01/23/2017.     Review of Systems  Constitutional: Negative for appetite change and unexpected weight change.  HENT: Positive for congestion. Negative for sinus pressure.   Respiratory: Negative for chest tightness.        Cough and congestion better.    Cardiovascular: Negative for chest pain, palpitations and leg swelling.  Gastrointestinal: Negative for abdominal pain, diarrhea, nausea and vomiting.  Genitourinary: Negative for difficulty urinating and dysuria.  Musculoskeletal: Negative for back pain and joint swelling.  Skin: Negative for color change and rash.  Neurological: Negative for dizziness, light-headedness and headaches.  Psychiatric/Behavioral: Negative for agitation and dysphoric mood.       Objective:    Physical Exam  Constitutional: She appears well-developed and well-nourished. No distress.  HENT:  Nose: Nose normal.  Mouth/Throat: Oropharynx is clear and moist.  Neck: Neck supple. No thyromegaly present.  Cardiovascular: Normal rate and regular rhythm.   Pulmonary/Chest: Breath sounds normal. No respiratory distress. She has no wheezes.  Abdominal: Soft. Bowel sounds are normal. There is no tenderness.  Musculoskeletal: She exhibits no edema or tenderness.  Lymphadenopathy:    She has no cervical adenopathy.  Skin: No rash noted. No erythema.  Psychiatric: She has a normal mood and affect. Her behavior is normal.    BP 140/70 (BP Location: Left Arm, Patient Position: Sitting, Cuff Size: Normal)   Pulse 72   Temp 98.2 F  (36.8 C) (Oral)   Resp 12   Ht 5' 6"  (1.676 m)   Wt 155 lb 3.2 oz (70.4 kg)   SpO2 96%   BMI 25.05 kg/m  Wt Readings from Last 3 Encounters:  01/24/17 153 lb (69.4 kg)  01/23/17 155 lb 3.2 oz (70.4 kg)  01/16/17 155 lb (70.3 kg)     Lab Results  Component Value Date   WBC 11.5 (H) 01/18/2017   HGB 11.7 (L) 01/18/2017   HCT 34.2 (L) 01/18/2017   PLT 364 01/18/2017   GLUCOSE 426 (H)  01/23/2017   CHOL 174 01/09/2017   TRIG 136.0 01/09/2017   HDL 41.90 01/09/2017   LDLCALC 105 (H) 01/09/2017   ALT 14 01/15/2017   AST 24 01/15/2017   NA 124 (L) 01/23/2017   K 4.9 01/23/2017   CL 90 (L) 01/23/2017   CREATININE 0.94 01/23/2017   BUN 26 (H) 01/23/2017   CO2 28 01/23/2017   TSH 1.35 04/25/2016   HGBA1C 8.0 (H) 01/09/2017   MICROALBUR 4.9 (H) 04/25/2016    Ct Angio Ao+bifem W & Or Wo Contrast  Result Date: 01/18/2017 CLINICAL DATA:  Incidental finding of atherosclerotic plaque involving the left femoral artery on DVT ultrasound. Evaluate for PAD. EXAM: CT ANGIOGRAPHY OF ABDOMINAL AORTA WITH ILIOFEMORAL RUNOFF TECHNIQUE: Multidetector CT imaging of the abdomen, pelvis and lower extremities was performed using the standard protocol during bolus administration of intravenous contrast. Multiplanar CT image reconstructions and MIPs were obtained to evaluate the vascular anatomy. CONTRAST:  125 cc Isovue 370 COMPARISON:  CT abdomen and pelvis - 03/27/2011 ; left lower extremity venous Doppler ultrasound - earlier same day ; chest CT -01/15/2017 FINDINGS: VASCULAR Aorta: Moderate amount of eccentric mixed calcified and noncalcified atherosclerotic plaque within a tortuous but normal caliber abdominal aorta, not resulting in hemodynamically significant stenosis. No abdominal aortic dissection or periaortic stranding. Celiac: There is a minimal amount of eccentric mixed calcified and noncalcified atherosclerotic plaque involving the cranial aspect of the origin the celiac artery, not resulting  in hemodynamically significant stenosis. Conventional branching pattern. SMA: There is eccentric mixed calcified and noncalcified atherosclerotic plaque involving the origin and proximal aspect of the SMA which approaches approximately 50% luminal narrowing (representative axial image 42, series 4; coronal image 98, series 6). Conventional branching pattern. The distal tributaries of the SMA are widely patent without discrete intraluminal filling defect suggest distal embolism. Renals: Solitary bilaterally there is a minimal amount of eccentric mixed calcified and noncalcified atherosclerotic plaque involving the origin of the bilateral renal arteries, not definitely resulting in a hemodynamically significant stenosis. IMA: Remains patent. RIGHT Lower Extremity Inflow: The right common, external and internal iliac arteries are mildly diseased and tortuous though patent without hemodynamically significant stenosis and of normal caliber. Outflow: There is a moderate to large amount of eccentric mixed calcified and noncalcified atherosclerotic plaque within the right common femoral artery (representative images 127 and 131, series 4) which is approaches approximately 50% luminal narrowing. The right superficial and deep femoral arteries are widely patent without hemodynamically significant stenosis. Both the right above and below-knee popliteal arteries are widely patent without hemodynamically significant stenosis. Runoff: Three-vessel runoff to the right lower leg and foot. The right-sided dorsalis pedis artery is patent to the level of the forefoot. LEFT Lower Extremity Inflow: The left common, external and internal iliac arteries are mildly diseased and tortuous though patent without hemodynamically significant stenosis and of normal caliber. Outflow: There is a large amount of eccentric mixed calcified and noncalcified atherosclerotic plaque within the left common femoral artery (representative images 132 and 136,  series 4), compatible with the findings seen on preceding DVT ultrasound and likely resulting in at least 50% luminal narrowing. The left superficial and deep femoral arteries are widely patent. Both the left above and below-knee popliteal arteries are widely patent. Runoff: Three-vessel runoff to the left lower leg and foot. The left-sided dorsalis pedis artery is patent to the level of the forefoot. Veins: The IVC and pelvic venous system appears widely patent on this arterial phase examination. Review of the MIP  images confirms the above findings. NON-VASCULAR Evaluation of abdominal organs is limited to the arterial phase of enhancement. Lower chest: Limited visualization of lower thorax demonstrates consolidative opacities within in the posteromedial and lateral basilar aspects of the right lower lobe with associated air bronchograms, progressed compared to the 01/15/2017 examination. No pleural effusion. Cardiomegaly. Coronary artery calcifications. No pericardial effusion. Hepatobiliary: Normal hepatic contour. No discrete hyperenhancing hepatic lesions. Post cholecystectomy. No intra extrahepatic bili duct dilatation. No ascites. Pancreas: Normal appearance of the pancreas Spleen: Normal appearance of the spleen. Adrenals/Urinary Tract: There is symmetric enhancement of the bilateral kidneys. No definite renal stones this postcontrast examination. No discrete renal lesions. No urine obstruction. There is a minimal amount of grossly symmetric bilateral perinephric stranding, likely age and body habitus related. No urine obstruction. Normal appearance the bilateral adrenal glands. Normal appearance of the urinary bladder given degree distention. Stomach/Bowel: Rather extensive colonic diverticulosis without evidence of diverticulitis. Normal appearance of the terminal ileum. The appendix is not visualized, however there is no pericecal inflammatory change. No pneumoperitoneum, pneumatosis or portal venous gas.  Lymphatic: No bulky retroperitoneal, mesenteric, pelvic or inguinal lymphadenopathy. Reproductive: Post hysterectomy. No discrete adnexal lesion. No free fluid the pelvic cul-de-sac. Other: Post right lower ventral wall hernia repair without evidence of recurrence. Musculoskeletal: No acute or aggressive osseous abnormalities. Old moderate (approximately 40%) compression deformity involving the superior endplate of the L1 vertebral body, similar to the 03/2011 exam. Moderate severe multilevel lumbar spine DDD, worse at L4-L5 and L5-S1 with disc space height loss, endplate irregularity and sclerosis. Bone islands are incidentally noted within the left acetabulum and right femoral head. IMPRESSION: VASCULAR 1. Moderate amount of mixed calcified and noncalcified atherosclerotic plaque within a normal caliber abdominal aorta. Aortic Atherosclerosis (ICD10-I70.0). 2. Suspected hemodynamically significant stenoses involving the bilateral common femoral arteries, left greater than right. 3. Three-vessel runoff to the bilateral lower legs and feet with dorsalis pedis artery is patent to the level of the forefeet bilaterally. 4. Suspected hemodynamically significant narrowing involving the proximal SMA, however both the celiac and IMA are widely patent without hemodynamically significant stenosis. 5. Coronary artery calcifications. NON-VASCULAR 1. Worsening right basilar consolidative opacities with associated air bronchograms, progressed compared to the 01/15/2017 examination and potentially indicative of worsening infection and/or aspiration. Clinical correlation is advised. 2. Extensive colonic diverticulosis without evidence of diverticulitis. Electronically Signed   By: Sandi Mariscal M.D.   On: 01/18/2017 16:59   US Venous Img Lower Unilateral Left  Result Date: 01/18/2017 CLINICAL DATA:  Left lower extremity swelling. EXAM: Left LOWER EXTREMITY VENOUS DOPPLER ULTRASOUND TECHNIQUE: Gray-scale sonography with graded  compression, as well as color Doppler and duplex ultrasound were performed to evaluate the lower extremity deep venous systems from the level of the common femoral vein and including the common femoral, femoral, profunda femoral, popliteal and calf veins including the posterior tibial, peroneal and gastrocnemius veins when visible. The superficial great saphenous vein was also interrogated. Spectral Doppler was utilized to evaluate flow at rest and with distal augmentation maneuvers in the common femoral, femoral and popliteal veins. COMPARISON:  None. FINDINGS: Contralateral Common Femoral Vein: Respiratory phasicity is normal and symmetric with the symptomatic side. No evidence of thrombus. Normal compressibility. Common Femoral Vein: No evidence of thrombus. Normal compressibility, respiratory phasicity and response to augmentation. Saphenofemoral Junction: No evidence of thrombus. Normal compressibility and flow on color Doppler imaging. Profunda Femoral Vein: No evidence of thrombus. Normal compressibility and flow on color Doppler imaging. Femoral Vein: No evidence of  thrombus. Normal compressibility, respiratory phasicity and response to augmentation. Popliteal Vein: No evidence of thrombus. Normal compressibility, respiratory phasicity and response to augmentation. Calf Veins: No evidence of thrombus. Normal compressibility and flow on color Doppler imaging. Venous Reflux:  None. Other Findings: 3 cm Baker's cyst is noted in left popliteal fossa. Incidental note is made of large irregular and partially mobile plaque in the left common femoral artery resulting in moderate to severe stenosis. IMPRESSION: No evidence of DVT within the left lower extremity. 3 cm Baker's cyst seen in left popliteal fossa. Large irregular and partially mobile plaque is noted in the left common femoral artery resulting in moderate to severe stenosis. This could represent an embolic source into more distal arteries and vascular  surgery consultation is recommended. Electronically Signed   By: Marijo Conception, M.D.   On: 01/18/2017 10:11   Dg Chest Port 1 View  Result Date: 01/18/2017 CLINICAL DATA:  Cough.  Recent diagnosis of pneumonia. EXAM: PORTABLE CHEST 1 VIEW COMPARISON:  01/15/2017 FINDINGS: Mild cardiomegaly persists. Aortic atherosclerosis again demonstrated. Patchy bilateral bronchopneumonia right worse than left shows improvement. No worsening or new findings. IMPRESSION: Radiographic improvement in patchy bilateral bronchopneumonia right more than left. Electronically Signed   By: Nelson Chimes M.D.   On: 01/18/2017 09:34       Assessment & Plan:   Problem List Items Addressed This Visit    Demand ischemia [facility])    Had slightly elevated troponin in the hospital.  Felt to be demand ischemia.  No further cardiac w/up warranted.  Follow.        Gastroparesis    Followed by GI.        Hypercholesterolemia    Low cholesterol diet and exercise.  On crestor.  Follow lipid panel and liver function tests.        Hypertension    Blood pressure under good control.  Continue same medication regimen.  Follow pressures.  Follow metabolic panel.        Hyponatremia - Primary    Corrects some with sugar level.  Follow.        Relevant Orders   Basic metabolic panel (Completed)   Pneumonia    Recently admitted with sepsis/pneumonia.  Treated with IVFs and IV abx.  Completing oral abx and prednisone.  Breathing better.  Follow.  Get her back in soon to reassess.  Will need f/u cxr to confirm clearance.        Sepsis ([place])    Recently admitted with pneumonia/sepsis.  Treated with IVFs and IV abx.  Compete oral abx and prednisone.  Will need f/u cxr to confirm clearance.        SOB (shortness of breath)    Breathing better with treatment of pneumonia.        Type 2 diabetes mellitus with diabetic neuropathy, without long-term current use of insulin ([facility])    Sugars elevated with recent pneumonia and  steroids.  Remains on prednisone.  Low carb diet.  Stay hydrated.  Follow sugars.            Einar Pheasant, MD

## 2017-01-24 ENCOUNTER — Telehealth: Payer: Self-pay | Admitting: Internal Medicine

## 2017-01-24 ENCOUNTER — Ambulatory Visit (INDEPENDENT_AMBULATORY_CARE_PROVIDER_SITE_OTHER): Payer: Medicare Other | Admitting: Vascular Surgery

## 2017-01-24 ENCOUNTER — Encounter (INDEPENDENT_AMBULATORY_CARE_PROVIDER_SITE_OTHER): Payer: Self-pay | Admitting: Vascular Surgery

## 2017-01-24 VITALS — BP 133/67 | HR 67 | Resp 16 | Ht 66.0 in | Wt 153.0 lb

## 2017-01-24 DIAGNOSIS — E1140 Type 2 diabetes mellitus with diabetic neuropathy, unspecified: Secondary | ICD-10-CM

## 2017-01-24 DIAGNOSIS — I248 Other forms of acute ischemic heart disease: Secondary | ICD-10-CM

## 2017-01-24 DIAGNOSIS — M79605 Pain in left leg: Secondary | ICD-10-CM | POA: Diagnosis not present

## 2017-01-24 DIAGNOSIS — M79604 Pain in right leg: Secondary | ICD-10-CM | POA: Diagnosis not present

## 2017-01-24 DIAGNOSIS — I10 Essential (primary) hypertension: Secondary | ICD-10-CM | POA: Diagnosis not present

## 2017-01-24 DIAGNOSIS — I70213 Atherosclerosis of native arteries of extremities with intermittent claudication, bilateral legs: Secondary | ICD-10-CM | POA: Diagnosis not present

## 2017-01-24 DIAGNOSIS — E7800 Pure hypercholesterolemia, unspecified: Secondary | ICD-10-CM | POA: Diagnosis not present

## 2017-01-24 NOTE — Telephone Encounter (Signed)
Pt called in her fasting blood sugar. Pt took it at 8 am and it was 188. Please advise, thank you!  Call pt @ [phone]

## 2017-01-24 NOTE — Telephone Encounter (Signed)
Called patient states blood sugar is 188 this am and she feels much better.

## 2017-01-24 NOTE — Telephone Encounter (Signed)
Called pt.  She is feeling good.  Eating.  Blood sugar improved.  We discussed insulin.  After discussion and since improvement, will hold on sending in insulin.  Will check sugar before lunch, before supper and before breakfast and call in readings.

## 2017-01-25 DIAGNOSIS — M6281 Muscle weakness (generalized): Secondary | ICD-10-CM | POA: Diagnosis not present

## 2017-01-25 DIAGNOSIS — J189 Pneumonia, unspecified organism: Secondary | ICD-10-CM | POA: Diagnosis not present

## 2017-01-25 DIAGNOSIS — I248 Other forms of acute ischemic heart disease: Secondary | ICD-10-CM | POA: Diagnosis not present

## 2017-01-25 DIAGNOSIS — E1142 Type 2 diabetes mellitus with diabetic polyneuropathy: Secondary | ICD-10-CM | POA: Diagnosis not present

## 2017-01-25 DIAGNOSIS — I70212 Atherosclerosis of native arteries of extremities with intermittent claudication, left leg: Secondary | ICD-10-CM | POA: Diagnosis not present

## 2017-01-25 DIAGNOSIS — I2510 Atherosclerotic heart disease of native coronary artery without angina pectoris: Secondary | ICD-10-CM | POA: Diagnosis not present

## 2017-01-25 NOTE — Telephone Encounter (Signed)
She should be completing the prednisone soon.  Sugars should start coming down some then.  Please confirm she is feeling ok.  See how her sugar was at lunch today.  Have her continue to check and record her sugar at least twice a day.  Call us on Monday.  If any problems, let me know.  Will hold on other medication at this time.

## 2017-01-25 NOTE — Telephone Encounter (Signed)
Please advise 

## 2017-01-25 NOTE — Telephone Encounter (Signed)
Spoke to patient states that she feels fine today her readings before lunch was 265. She will keep a check on it and let us know Monday. If any problems she will give [place] a call

## 2017-01-25 NOTE — Telephone Encounter (Signed)
Pt called in this morning with blood sugar reading:  Before lunch 328 Before dinner 427 Before bed 422 Before breakfast 188  Call pt @ [phone]

## 2017-01-26 ENCOUNTER — Encounter: Payer: Self-pay | Admitting: Internal Medicine

## 2017-01-26 DIAGNOSIS — M79605 Pain in left leg: Secondary | ICD-10-CM | POA: Insufficient documentation

## 2017-01-26 DIAGNOSIS — I70219 Atherosclerosis of native arteries of extremities with intermittent claudication, unspecified extremity: Secondary | ICD-10-CM | POA: Insufficient documentation

## 2017-01-26 DIAGNOSIS — M79606 Pain in leg, unspecified: Secondary | ICD-10-CM | POA: Insufficient documentation

## 2017-01-26 NOTE — Assessment & Plan Note (Signed)
Recently admitted with pneumonia/sepsis.  Treated with IVFs and IV abx.  Compete oral abx and prednisone.  Will need f/u cxr to confirm clearance.

## 2017-01-26 NOTE — Assessment & Plan Note (Signed)
Blood pressure under good control.  Continue same medication regimen.  Follow pressures.  Follow metabolic panel.   

## 2017-01-26 NOTE — Assessment & Plan Note (Signed)
Recently admitted with sepsis/pneumonia.  Treated with IVFs and IV abx.  Completing oral abx and prednisone.  Breathing better.  Follow.  Get her back in soon to reassess.  Will need f/u cxr to confirm clearance.

## 2017-01-26 NOTE — Assessment & Plan Note (Signed)
Had slightly elevated troponin in the hospital.  Felt to be demand ischemia.  No further cardiac w/up warranted.  Follow.

## 2017-01-26 NOTE — Assessment & Plan Note (Signed)
Low cholesterol diet and exercise.  On crestor.  Follow lipid panel and liver function tests.  

## 2017-01-26 NOTE — Progress Notes (Signed)
MRN : [id number]  Debra Kirk is a 83 y.o. (1929/11/07) female who presents with chief complaint of  Chief Complaint  Patient presents with  . New Evaluation    1 week [facility] follow up  .  History of Present Illness:The patient returns to the office for followup and review of the noninvasive studies. There have been no interval changes in lower extremity symptoms. No interval shortening of the patient's claudication distance or development of rest pain symptoms. No new ulcers or wounds have occurred since the last visit.  There have been no significant changes to the patient's overall health care.  The patient denies amaurosis fugax or recent TIA symptoms. There are no recent neurological changes noted. The patient denies history of DVT, PE or superficial thrombophlebitis. The patient denies recent episodes of angina or shortness of breath.    Current Meds  Medication Sig  . albuterol (PROVENTIL) (2.5 MG/3ML) 0.083% nebulizer solution Take 3 mLs (2.5 mg total) by nebulization every 4 (four) hours.  Marland Kitchen amLODipine (NORVASC) 5 MG tablet TAKE 1 TABLET BY MOUTH DAILY  . aspirin 81 MG tablet Take 81 mg by mouth daily.  Marland Kitchen BAYER CONTOUR TEST test strip CHECK DAILY AND AS NEEDED **E11.9**  . budesonide-formoterol (SYMBICORT) 160-4.5 MCG/ACT inhaler Inhale 2 puffs into the lungs 2 (two) times daily.  . Calcium Carbonate-Vitamin D (CALCIUM 600+D) 600-400 MG-UNIT per tablet Take 1 tablet by mouth 2 (two) times daily.  . chlorpheniramine-HYDROcodone (TUSSIONEX) 10-8 MG/5ML SUER Take 5 mLs by mouth every 12 (twelve) hours.  Marland Kitchen denosumab (PROLIA) 60 MG/ML SOLN injection Inject 60 mg into the skin every 6 (six) months. Administer in upper arm, thigh, or abdomen  . glimepiride (AMARYL) 2 MG tablet TAKE 1 TABLET BY MOUTH EVERY DAY WITH BREAKFAST  . guaiFENesin-codeine 100-10 MG/5ML syrup Take 10 mLs by mouth every 4 (four) hours as needed for cough.  Marland Kitchen levofloxacin (LEVAQUIN) 750 MG tablet Take 1  tablet (750 mg total) by mouth every other day.  . meclizine (ANTIVERT) 12.5 MG tablet Take 1 tablet (12.5 mg total) by mouth 2 (two) times daily as needed for dizziness.  . metoCLOPramide (REGLAN) 5 MG tablet Take 5 mg by mouth 2 (two) times daily.   . metoprolol (LOPRESSOR) 50 MG tablet TAKE 1 AND 1/2 TABLET BY MOUTH TWICE A DAY  . omeprazole (PRILOSEC) 20 MG capsule TAKE ONE CAPSULE BY MOUTH TWICE A DAY  . predniSONE (DELTASONE) 10 MG tablet Take 1 tablet (10 mg total) by mouth daily with breakfast. Please take 6 pills in the morning on the day 1, then taper by one pill daily until finished, thank you  . rosuvastatin (CRESTOR) 5 MG tablet TAKE 1 TABLET (5 MG TOTAL) BY MOUTH DAILY.    Past Medical History:  Diagnosis Date  . Carotid artery disease ([place])   . Compression fracture    s/p fosamax, prolia  . Diabetes mellitus ([place])   . Femoral neuropathy    left, elevated CRP, ESR, negative FANA and ANCA  . Fibrocystic breast disease   . Hypercholesterolemia   . Hypertension     Past Surgical History:  Procedure Laterality Date  . APPENDECTOMY    . CHOLECYSTECTOMY    . HERNIA REPAIR    . INGUINAL HERNIA REPAIR     left x 1, right x 2  . OVARIAN CYST REMOVAL    . PARTIAL HYSTERECTOMY      Social History Social History  Substance Use Topics  .  Smoking status: Former Smoker    Quit date: 08/20/1958  . Smokeless tobacco: Never Used  . Alcohol use No    Family History Family History  Problem Relation Age of Onset  . Heart disease Father        myocardial infarction  . Heart disease Mother        myocardial infarction  . Diabetes Mother   . Congestive Heart Failure Mother   . Bone cancer Sister   . Diabetes Sister        x2  . Heart disease Sister        CABG  . Rheum arthritis Sister   . Breast cancer Neg Hx   . Colon cancer Neg Hx   No family history of bleeding/clotting disorders, porphyria or autoimmune disease   Allergies  Allergen Reactions  . Penicillins  Rash    .pcn     REVIEW OF SYSTEMS (Negative unless checked)  Constitutional: [] Weight loss  [] Fever  [] Chills Cardiac: [] Chest pain   [] Chest pressure   [] Palpitations   [] Shortness of breath when laying flat   [x] Shortness of breath with exertion. Vascular:  [] Pain in legs with walking   [x] Pain in legs at rest  [] History of DVT   [] Phlebitis   [] Swelling in legs   [] Varicose veins   [] Non-healing ulcers Pulmonary:   [] Uses home oxygen   [] Productive cough   [] Hemoptysis   [] Wheeze  [] COPD   [] Asthma Neurologic:  [] Dizziness   [] Seizures   [] History of stroke   [] History of TIA  [] Aphasia   [] Vissual changes   [] Weakness or numbness in arm   [] Weakness or numbness in leg Musculoskeletal:   [] Joint swelling   [] Joint pain   [] Low back pain Hematologic:  [] Easy bruising  [] Easy bleeding   [] Hypercoagulable state   [] Anemic Gastrointestinal:  [] Diarrhea   [] Vomiting  [] Gastroesophageal reflux/heartburn   [] Difficulty swallowing. Genitourinary:  [] Chronic kidney disease   [] Difficult urination  [] Frequent urination   [] Blood in urine Skin:  [] Rashes   [] Ulcers  Psychological:  [] History of anxiety   []  History of major depression.  Physical Examination  Vitals:   01/24/17 1313  BP: 133/67  Pulse: 67  Resp: 16  Weight: 69.4 kg (153 lb)  Height: 5' 6"  (1.676 m)   Body mass index is 24.69 kg/m. Gen: WD/WN, NAD Head: [place]/AT, No temporalis wasting.  Ear/Nose/Throat: Hearing grossly intact, nares w/o erythema or drainage, poor dentition Eyes: PER, EOMI, sclera nonicteric.  Neck: Supple, no masses.  No bruit or JVD.  Pulmonary:  Good air movement, clear to auscultation bilaterally, no use of accessory muscles.  Cardiac: RRR, normal S1, S2, no Murmurs. Vascular:  Vessel Right Left  Radial Palpable Palpable  Ulnar Palpable Palpable  Brachial Palpable Palpable  Carotid Palpable Palpable  Femoral 1+ Palpable 1+ Palpable  Popliteal Trace Palpable Trace Palpable  PT Not Palpable Not  Palpable  DP Not Palpable Not Palpable   Gastrointestinal: soft, non-distended. No guarding/no peritoneal signs.  Musculoskeletal: M/S 5/5 throughout.  No deformity or atrophy.  Neurologic: CN 2-12 intact. Pain and light touch intact in extremities.  Symmetrical.  Speech is fluent. Motor exam as listed above. Psychiatric: Judgment intact, Mood & affect appropriate for pt's clinical situation. Dermatologic: No rashes or ulcers noted.  No changes consistent with cellulitis. Lymph : No Cervical lymphadenopathy, no lichenification or skin changes of chronic lymphedema.  CBC Lab Results  Component Value Date   WBC 11.5 (H) 01/18/2017   HGB 11.7 (  L) 01/18/2017   HCT 34.2 (L) 01/18/2017   MCV 84.3 01/18/2017   PLT 364 01/18/2017    BMET    Component Value Date/Time   NA 124 (L) 01/23/2017 1421   K 4.9 01/23/2017 1421   K 3.7 10/26/2013 1159   CL 90 (L) 01/23/2017 1421   CO2 28 01/23/2017 1421   GLUCOSE 426 (H) 01/23/2017 1421   BUN 26 (H) 01/23/2017 1421   CREATININE 0.94 01/23/2017 1421   CALCIUM 9.3 01/23/2017 1421   GFRNONAA >60 01/18/2017 0414   GFRAA >60 01/18/2017 0414   Estimated Creatinine Clearance: 41 mL/min (by C-G formula based on SCr of 0.94 mg/dL).  COAG No results found for: INR, PROTIME  Radiology Dg Chest 2 View  Result Date: 01/15/2017 CLINICAL DATA:  Shortness of breath. EXAM: CHEST  2 VIEW COMPARISON:  03/23/2015 FINDINGS: There is asymmetric reticular density at the right base. Fullness of the right hilum which could reflect underlying adenopathy or mass. Background hyperinflation, suspected emphysema. Borderline cardiomegaly. Remote L1 compression fracture. IMPRESSION: Infection and/or atelectasis at the right base. Prominent right hilum which could reflect underlying mass or adenopathy. Recommend chest CT follow-up. Electronically Signed   By: Monte Fantasia M.D.   On: 01/15/2017 12:59   Ct Chest W Contrast  Result Date: 01/15/2017 CLINICAL DATA:   Worsening shortness of breath over the last couple weeks, oxygen desaturation, possible pneumonia, abnormal chest radiograph EXAM: CT CHEST WITH CONTRAST TECHNIQUE: Multidetector CT imaging of the chest was performed during intravenous contrast administration. Sagittal and coronal MPR images reconstructed from axial data set. CONTRAST:  64m ISOVUE-300 IOPAMIDOL (ISOVUE-300) INJECTION 61% IV COMPARISON:  06/04/2011 Radiographic correlation: Chest radiographs 01/15/2017 and 03/23/2015 FINDINGS: Cardiovascular: Atherosclerotic calcifications aorta, coronary arteries and proximal great vessels. Upper normal caliber ascending thoracic aorta 3.7 cm diameter. No pericardial effusion. Mediastinum/Nodes: Multiple mildly enlarged mediastinal and RIGHT hilar nodes. RIGHT superior hilar node 18 mm short axis image 76. Enlarged subcarinal node 13 mm short axis image 74. Enlarged precarinal lymph nodes 15 mm short axis image 69 and 11 mm short axis image 62. Numerous additional normal to mildly enlarged RIGHT paratracheal and superior mediastinal nodes. No axillary adenopathy. Base of cervical region unremarkable. Esophagus normal appearance. Lungs/Pleura: Central peribronchial thickening. RIGHT lower lobe infiltrate consistent with pneumonia. Mild tree-in-bud infiltrates in the RIGHT upper lobe and scattered in LEFT lung. Subsegmental atelectasis LEFT lower lobe. No pleural effusion or pneumothorax. No discrete pulmonary mass/nodule. Upper Abdomen: No definite focal upper abdominal abnormalities. Musculoskeletal: Probable vertebral hemangioma at T8 and potential additional vertebral hemangioma at T4 extending into RIGHT pedicle, unchanged. Superior endplate compression fracture of L1 with 25% anterior height loss, unchanged since 03/23/2015. IMPRESSION: Central peribronchial thickening with scattered infiltrates in both lungs greatest in RIGHT lower lobe consistent with pneumonia. RIGHT hilar and mediastinal adenopathy,  potentially reactive in the setting of acute infiltrate; recommend follow-up CT chest with contrast in 3-4 months following resolution of the patient's acute process to exclude other causes of adenopathy including tumor and lymphoma. Aortic atherosclerosis and coronary artery calcification. Electronically Signed   By: MLavonia DanaM.D.   On: 01/15/2017 14:56   Ct Angio Ao+bifem W & Or Wo Contrast  Result Date: 01/18/2017 CLINICAL DATA:  Incidental finding of atherosclerotic plaque involving the left femoral artery on DVT ultrasound. Evaluate for PAD. EXAM: CT ANGIOGRAPHY OF ABDOMINAL AORTA WITH ILIOFEMORAL RUNOFF TECHNIQUE: Multidetector CT imaging of the abdomen, pelvis and lower extremities was performed using the standard protocol during bolus administration  of intravenous contrast. Multiplanar CT image reconstructions and MIPs were obtained to evaluate the vascular anatomy. CONTRAST:  125 cc Isovue 370 COMPARISON:  CT abdomen and pelvis - 03/27/2011 ; left lower extremity venous Doppler ultrasound - earlier same day ; chest CT -01/15/2017 FINDINGS: VASCULAR Aorta: Moderate amount of eccentric mixed calcified and noncalcified atherosclerotic plaque within a tortuous but normal caliber abdominal aorta, not resulting in hemodynamically significant stenosis. No abdominal aortic dissection or periaortic stranding. Celiac: There is a minimal amount of eccentric mixed calcified and noncalcified atherosclerotic plaque involving the cranial aspect of the origin the celiac artery, not resulting in hemodynamically significant stenosis. Conventional branching pattern. SMA: There is eccentric mixed calcified and noncalcified atherosclerotic plaque involving the origin and proximal aspect of the SMA which approaches approximately 50% luminal narrowing (representative axial image 42, series 4; coronal image 98, series 6). Conventional branching pattern. The distal tributaries of the SMA are widely patent without discrete  intraluminal filling defect suggest distal embolism. Renals: Solitary bilaterally there is a minimal amount of eccentric mixed calcified and noncalcified atherosclerotic plaque involving the origin of the bilateral renal arteries, not definitely resulting in a hemodynamically significant stenosis. IMA: Remains patent. RIGHT Lower Extremity Inflow: The right common, external and internal iliac arteries are mildly diseased and tortuous though patent without hemodynamically significant stenosis and of normal caliber. Outflow: There is a moderate to large amount of eccentric mixed calcified and noncalcified atherosclerotic plaque within the right common femoral artery (representative images 127 and 131, series 4) which is approaches approximately 50% luminal narrowing. The right superficial and deep femoral arteries are widely patent without hemodynamically significant stenosis. Both the right above and below-knee popliteal arteries are widely patent without hemodynamically significant stenosis. Runoff: Three-vessel runoff to the right lower leg and foot. The right-sided dorsalis pedis artery is patent to the level of the forefoot. LEFT Lower Extremity Inflow: The left common, external and internal iliac arteries are mildly diseased and tortuous though patent without hemodynamically significant stenosis and of normal caliber. Outflow: There is a large amount of eccentric mixed calcified and noncalcified atherosclerotic plaque within the left common femoral artery (representative images 132 and 136, series 4), compatible with the findings seen on preceding DVT ultrasound and likely resulting in at least 50% luminal narrowing. The left superficial and deep femoral arteries are widely patent. Both the left above and below-knee popliteal arteries are widely patent. Runoff: Three-vessel runoff to the left lower leg and foot. The left-sided dorsalis pedis artery is patent to the level of the forefoot. Veins: The IVC and  pelvic venous system appears widely patent on this arterial phase examination. Review of the MIP images confirms the above findings. NON-VASCULAR Evaluation of abdominal organs is limited to the arterial phase of enhancement. Lower chest: Limited visualization of lower thorax demonstrates consolidative opacities within in the posteromedial and lateral basilar aspects of the right lower lobe with associated air bronchograms, progressed compared to the 01/15/2017 examination. No pleural effusion. Cardiomegaly. Coronary artery calcifications. No pericardial effusion. Hepatobiliary: Normal hepatic contour. No discrete hyperenhancing hepatic lesions. Post cholecystectomy. No intra extrahepatic bili duct dilatation. No ascites. Pancreas: Normal appearance of the pancreas Spleen: Normal appearance of the spleen. Adrenals/Urinary Tract: There is symmetric enhancement of the bilateral kidneys. No definite renal stones this postcontrast examination. No discrete renal lesions. No urine obstruction. There is a minimal amount of grossly symmetric bilateral perinephric stranding, likely age and body habitus related. No urine obstruction. Normal appearance the bilateral adrenal glands. Normal appearance of  the urinary bladder given degree distention. Stomach/Bowel: Rather extensive colonic diverticulosis without evidence of diverticulitis. Normal appearance of the terminal ileum. The appendix is not visualized, however there is no pericecal inflammatory change. No pneumoperitoneum, pneumatosis or portal venous gas. Lymphatic: No bulky retroperitoneal, mesenteric, pelvic or inguinal lymphadenopathy. Reproductive: Post hysterectomy. No discrete adnexal lesion. No free fluid the pelvic cul-de-sac. Other: Post right lower ventral wall hernia repair without evidence of recurrence. Musculoskeletal: No acute or aggressive osseous abnormalities. Old moderate (approximately 40%) compression deformity involving the superior endplate of the  L1 vertebral body, similar to the 03/2011 exam. Moderate severe multilevel lumbar spine DDD, worse at L4-L5 and L5-S1 with disc space height loss, endplate irregularity and sclerosis. Bone islands are incidentally noted within the left acetabulum and right femoral head. IMPRESSION: VASCULAR 1. Moderate amount of mixed calcified and noncalcified atherosclerotic plaque within a normal caliber abdominal aorta. Aortic Atherosclerosis (ICD10-I70.0). 2. Suspected hemodynamically significant stenoses involving the bilateral common femoral arteries, left greater than right. 3. Three-vessel runoff to the bilateral lower legs and feet with dorsalis pedis artery is patent to the level of the forefeet bilaterally. 4. Suspected hemodynamically significant narrowing involving the proximal SMA, however both the celiac and IMA are widely patent without hemodynamically significant stenosis. 5. Coronary artery calcifications. NON-VASCULAR 1. Worsening right basilar consolidative opacities with associated air bronchograms, progressed compared to the 01/15/2017 examination and potentially indicative of worsening infection and/or aspiration. Clinical correlation is advised. 2. Extensive colonic diverticulosis without evidence of diverticulitis. Electronically Signed   By: Sandi Mariscal M.D.   On: 01/18/2017 16:59   US Venous Img Lower Unilateral Left  Result Date: 01/18/2017 CLINICAL DATA:  Left lower extremity swelling. EXAM: Left LOWER EXTREMITY VENOUS DOPPLER ULTRASOUND TECHNIQUE: Gray-scale sonography with graded compression, as well as color Doppler and duplex ultrasound were performed to evaluate the lower extremity deep venous systems from the level of the common femoral vein and including the common femoral, femoral, profunda femoral, popliteal and calf veins including the posterior tibial, peroneal and gastrocnemius veins when visible. The superficial great saphenous vein was also interrogated. Spectral Doppler was utilized to  evaluate flow at rest and with distal augmentation maneuvers in the common femoral, femoral and popliteal veins. COMPARISON:  None. FINDINGS: Contralateral Common Femoral Vein: Respiratory phasicity is normal and symmetric with the symptomatic side. No evidence of thrombus. Normal compressibility. Common Femoral Vein: No evidence of thrombus. Normal compressibility, respiratory phasicity and response to augmentation. Saphenofemoral Junction: No evidence of thrombus. Normal compressibility and flow on color Doppler imaging. Profunda Femoral Vein: No evidence of thrombus. Normal compressibility and flow on color Doppler imaging. Femoral Vein: No evidence of thrombus. Normal compressibility, respiratory phasicity and response to augmentation. Popliteal Vein: No evidence of thrombus. Normal compressibility, respiratory phasicity and response to augmentation. Calf Veins: No evidence of thrombus. Normal compressibility and flow on color Doppler imaging. Venous Reflux:  None. Other Findings: 3 cm Baker's cyst is noted in left popliteal fossa. Incidental note is made of large irregular and partially mobile plaque in the left common femoral artery resulting in moderate to severe stenosis. IMPRESSION: No evidence of DVT within the left lower extremity. 3 cm Baker's cyst seen in left popliteal fossa. Large irregular and partially mobile plaque is noted in the left common femoral artery resulting in moderate to severe stenosis. This could represent an embolic source into more distal arteries and vascular surgery consultation is recommended. Electronically Signed   By: Marijo Conception, M.D.   On: 01/18/2017  10:11   Dg Chest Port 1 View  Result Date: 01/18/2017 CLINICAL DATA:  Cough.  Recent diagnosis of pneumonia. EXAM: PORTABLE CHEST 1 VIEW COMPARISON:  01/15/2017 FINDINGS: Mild cardiomegaly persists. Aortic atherosclerosis again demonstrated. Patchy bilateral bronchopneumonia right worse than left shows improvement. No  worsening or new findings. IMPRESSION: Radiographic improvement in patchy bilateral bronchopneumonia right more than left. Electronically Signed   By: Nelson Chimes M.D.   On: 01/18/2017 09:34   Dg Chest Port 1 View  Result Date: 01/15/2017 CLINICAL DATA:  83 year old female with acute respiratory distress. EXAM: PORTABLE CHEST 1 VIEW COMPARISON:  Chest CT dated 01/15/2017 FINDINGS: Single portable view of the chest demonstrate a focal confluent areas of nodularity in the right upper lobe and at the right lung base corresponding to the density seen on the prior CT and most consistent with pneumonia. There is no focal consolidation, pleural effusion, or pneumothorax. Mild bronchiectatic changes and perihilar streaky densities noted. The cardiac silhouette is within normal limits. There is atherosclerotic calcification of the aortic arch. No acute osseous pathology. IMPRESSION: Confluent nodular densities predominantly involving the right lung consistent with pneumonia as seen on the earlier CT. Electronically Signed   By: Anner Crete M.D.   On: 01/15/2017 23:49    Assessment/Plan 1. Pain in both lower extremities  Recommend:  The patient has evidence of atherosclerosis of the lower extremities with claudication.  The patient does not voice lifestyle limiting changes at this point in time.  Noninvasive studies do not suggest clinically significant change.  No invasive studies, angiography or surgery at this time The patient should continue walking and begin a more formal exercise program.  The patient should continue antiplatelet therapy and aggressive treatment of the lipid abnormalities  No changes in the patient's medications at this time  The patient should continue wearing graduated compression socks 10-15 mmHg strength to control the mild edema.   - VAS [place] ABI WITH/WO TBI; Future - VAS [place] LOWER EXTREMITY ARTERIAL DUPLEX; Future  2. Atherosclerosis of native artery of both lower  extremities with intermittent claudication (HCC)  Recommend:  The patient has evidence of atherosclerosis of the lower extremities with claudication.  The patient does not voice lifestyle limiting changes at this point in time.  Noninvasive studies do not suggest clinically significant change.  No invasive studies, angiography or surgery at this time The patient should continue walking and begin a more formal exercise program.  The patient should continue antiplatelet therapy and aggressive treatment of the lipid abnormalities  No changes in the patient's medications at this time  The patient should continue wearing graduated compression socks 10-15 mmHg strength to control the mild edema.   - VAS [place] ABI WITH/WO TBI; Future - VAS [place] LOWER EXTREMITY ARTERIAL DUPLEX; Future  3. Essential hypertension Continue antihypertensive medications as already ordered, these medications have been reviewed and there are no changes at this time.   4. Type 2 diabetes mellitus with diabetic neuropathy, without long-term current use of insulin ([place]) Continue hypoglycemic medications as already ordered, these medications have been reviewed and there are no changes at this time.  Hgb A1C to be monitored as already arranged by primary service   5. Hypercholesterolemia Continue statin as ordered and reviewed, no changes at this time     Hortencia Pilar, MD  01/26/2017 5:21 PM

## 2017-01-26 NOTE — Assessment & Plan Note (Signed)
Breathing better with treatment of pneumonia.

## 2017-01-26 NOTE — Assessment & Plan Note (Signed)
Sugars elevated with recent pneumonia and steroids.  Remains on prednisone.  Low carb diet.  Stay hydrated.  Follow sugars.

## 2017-01-26 NOTE — Assessment & Plan Note (Signed)
Corrects some with sugar level.  Follow.

## 2017-01-26 NOTE — Assessment & Plan Note (Signed)
Followed by GI

## 2017-01-28 ENCOUNTER — Telehealth: Payer: Self-pay | Admitting: Internal Medicine

## 2017-01-28 ENCOUNTER — Telehealth: Payer: Self-pay

## 2017-01-28 DIAGNOSIS — E1142 Type 2 diabetes mellitus with diabetic polyneuropathy: Secondary | ICD-10-CM | POA: Diagnosis not present

## 2017-01-28 DIAGNOSIS — M6281 Muscle weakness (generalized): Secondary | ICD-10-CM | POA: Diagnosis not present

## 2017-01-28 DIAGNOSIS — J189 Pneumonia, unspecified organism: Secondary | ICD-10-CM | POA: Diagnosis not present

## 2017-01-28 DIAGNOSIS — I248 Other forms of acute ischemic heart disease: Secondary | ICD-10-CM | POA: Diagnosis not present

## 2017-01-28 DIAGNOSIS — I2510 Atherosclerotic heart disease of native coronary artery without angina pectoris: Secondary | ICD-10-CM | POA: Diagnosis not present

## 2017-01-28 DIAGNOSIS — I70212 Atherosclerosis of native arteries of extremities with intermittent claudication, left leg: Secondary | ICD-10-CM | POA: Diagnosis not present

## 2017-01-28 NOTE — Telephone Encounter (Signed)
Lmov for patient to call back and schedule appt °

## 2017-01-28 NOTE — Telephone Encounter (Signed)
Sent to advanced and copy put for charge.

## 2017-01-28 NOTE — Telephone Encounter (Signed)
Sugars doing much better.  Continue to monitor.  Should continue to decrease as she is off prednisone, etc.  Marin Comment tus know if any problems.

## 2017-01-28 NOTE — Telephone Encounter (Signed)
Pt called wanting to give her numbers for Diabetes.   01/26/2017 am 192 pm 253 01/27/2017 am 176 pm 222 01/28/2017 am 187  Call pt @ [phone]. Thank you!

## 2017-01-28 NOTE — Telephone Encounter (Signed)
Signed and placed in box.   

## 2017-01-28 NOTE — Telephone Encounter (Signed)
Patient informed will call if any questions or problems.  

## 2017-01-28 NOTE — Telephone Encounter (Signed)
Home health certification in your green folder to be filled out.

## 2017-01-28 NOTE — Telephone Encounter (Signed)
FYI

## 2017-02-04 ENCOUNTER — Encounter: Payer: Self-pay | Admitting: Internal Medicine

## 2017-02-04 ENCOUNTER — Telehealth: Payer: Self-pay | Admitting: Internal Medicine

## 2017-02-04 NOTE — Telephone Encounter (Signed)
Sent letter  Unable to get in contact with patient or daughter

## 2017-02-04 NOTE — Telephone Encounter (Signed)
Spoke to patient she wanted to let you know her readings were a little high she was on vacation. She is going to keep up with them this week and give [place] a call with her readings now that she is home. She was informed that you are out of the office and will not get them until next week. She will call if any questions or symptoms.

## 2017-02-04 NOTE — Telephone Encounter (Signed)
Pt called to give her readings on her sugar.  01/31/2017 187 02/01/2017 219 and 237 02/02/2017 183 and 261 0617/2018 248 and 202  Pt was on vacation. Thank you!

## 2017-02-05 DIAGNOSIS — I248 Other forms of acute ischemic heart disease: Secondary | ICD-10-CM | POA: Diagnosis not present

## 2017-02-05 DIAGNOSIS — J189 Pneumonia, unspecified organism: Secondary | ICD-10-CM | POA: Diagnosis not present

## 2017-02-05 DIAGNOSIS — I2510 Atherosclerotic heart disease of native coronary artery without angina pectoris: Secondary | ICD-10-CM | POA: Diagnosis not present

## 2017-02-05 DIAGNOSIS — I70212 Atherosclerosis of native arteries of extremities with intermittent claudication, left leg: Secondary | ICD-10-CM | POA: Diagnosis not present

## 2017-02-05 DIAGNOSIS — E1142 Type 2 diabetes mellitus with diabetic polyneuropathy: Secondary | ICD-10-CM | POA: Diagnosis not present

## 2017-02-05 DIAGNOSIS — M6281 Muscle weakness (generalized): Secondary | ICD-10-CM | POA: Diagnosis not present

## 2017-02-05 NOTE — Telephone Encounter (Signed)
Patient informed will call if any questions.  

## 2017-02-05 NOTE — Telephone Encounter (Signed)
Noted.  Low carb diet and exercise.  Agree with continued monitoring of sugars.  Send in readings.

## 2017-02-07 ENCOUNTER — Telehealth: Payer: Self-pay | Admitting: Internal Medicine

## 2017-02-07 DIAGNOSIS — I248 Other forms of acute ischemic heart disease: Secondary | ICD-10-CM | POA: Diagnosis not present

## 2017-02-07 DIAGNOSIS — M6281 Muscle weakness (generalized): Secondary | ICD-10-CM | POA: Diagnosis not present

## 2017-02-07 DIAGNOSIS — I2510 Atherosclerotic heart disease of native coronary artery without angina pectoris: Secondary | ICD-10-CM | POA: Diagnosis not present

## 2017-02-07 DIAGNOSIS — E1142 Type 2 diabetes mellitus with diabetic polyneuropathy: Secondary | ICD-10-CM | POA: Diagnosis not present

## 2017-02-07 DIAGNOSIS — J189 Pneumonia, unspecified organism: Secondary | ICD-10-CM | POA: Diagnosis not present

## 2017-02-07 DIAGNOSIS — I70212 Atherosclerosis of native arteries of extremities with intermittent claudication, left leg: Secondary | ICD-10-CM | POA: Diagnosis not present

## 2017-02-07 NOTE — Telephone Encounter (Signed)
Called patient she is still having dry nonproductive cough no shortness for breath or any other symptoms. Her f/u is not until 7/23. Per patient when she was seen on 6/5 you wanted f/u in 4 weeks. I do not see anything in the schedule at that time. Is their somewhere you would like to work her in? She does not feel like she needs to be seen this week to be evaluated. I offered  To make app with another provider she declined. Per Patient PT states her breathing is dong good.

## 2017-02-07 NOTE — Telephone Encounter (Signed)
Please advise were to schedule Debra Kirk only wants to see Dr. Nicki Reaper

## 2017-02-07 NOTE — Telephone Encounter (Signed)
Pt daughter called and stated that pt is still coughing and having some depression. They would really like to see Dr. Nicki Reaper before 7/23. Please advise, thank you.  Call pt @ [phone]

## 2017-02-09 ENCOUNTER — Other Ambulatory Visit: Payer: Self-pay | Admitting: Internal Medicine

## 2017-02-10 NOTE — Telephone Encounter (Signed)
Please hold for work in appt.  Thanks

## 2017-02-11 ENCOUNTER — Ambulatory Visit (INDEPENDENT_AMBULATORY_CARE_PROVIDER_SITE_OTHER): Payer: Medicare Other | Admitting: Internal Medicine

## 2017-02-11 ENCOUNTER — Encounter: Payer: Self-pay | Admitting: Internal Medicine

## 2017-02-11 ENCOUNTER — Ambulatory Visit (INDEPENDENT_AMBULATORY_CARE_PROVIDER_SITE_OTHER): Payer: Medicare Other

## 2017-02-11 VITALS — BP 140/68 | HR 68 | Temp 98.4°F | Resp 14 | Wt 151.8 lb

## 2017-02-11 DIAGNOSIS — I10 Essential (primary) hypertension: Secondary | ICD-10-CM | POA: Diagnosis not present

## 2017-02-11 DIAGNOSIS — J189 Pneumonia, unspecified organism: Secondary | ICD-10-CM

## 2017-02-11 DIAGNOSIS — I248 Other forms of acute ischemic heart disease: Secondary | ICD-10-CM

## 2017-02-11 DIAGNOSIS — R0602 Shortness of breath: Secondary | ICD-10-CM

## 2017-02-11 DIAGNOSIS — E1140 Type 2 diabetes mellitus with diabetic neuropathy, unspecified: Secondary | ICD-10-CM

## 2017-02-11 DIAGNOSIS — E871 Hypo-osmolality and hyponatremia: Secondary | ICD-10-CM | POA: Diagnosis not present

## 2017-02-11 MED ORDER — ALBUTEROL SULFATE HFA 108 (90 BASE) MCG/ACT IN AERS: 2.0000 | INHALATION_SPRAY | Freq: Four times a day (QID) | RESPIRATORY_TRACT | 1 refills | Status: DC | PRN

## 2017-02-11 MED ORDER — BUDESONIDE-FORMOTEROL FUMARATE 160-4.5 MCG/ACT IN AERO: 2.0000 | INHALATION_SPRAY | Freq: Two times a day (BID) | RESPIRATORY_TRACT | 1 refills | Status: DC

## 2017-02-11 MED ORDER — BENZONATATE 100 MG PO CAPS: 100.0000 mg | ORAL_CAPSULE | Freq: Three times a day (TID) | ORAL | 0 refills | Status: DC | PRN

## 2017-02-11 NOTE — Progress Notes (Signed)
Patient ID: Senna Lape, female   DOB: [birth year], 83 y.o.   MRN: [id number]   Subjective:    Patient ID: [name], female    DOB: 1930-03-29, 83 y.o.   MRN: [id number]  HPI  Patient here as a work in with concerns regarding persistent cough.  Was recently admitted 01/15/17 with bilateral pneumonia and sepsis.  She was treated with IV fluids, IV abx.  Completed oral abx and prednisone.  See last note for details.  Breathing is better.  She still reports increased fatigue and increased cough.  Some increased stress with not being back to her "normal self"  No chest pain.  Still some sob.  Still some productive cough.  Eating.  No nausea or vomiting.  Bowels moving.  Sugars better.  Am sugar yesterday am 160.  Sugar at lunch - 116.      Past Medical History:  Diagnosis Date  . Carotid artery disease ([place])   . Compression fracture    s/p fosamax, prolia  . Diabetes mellitus ([place])   . Femoral neuropathy    left, elevated CRP, ESR, negative FANA and ANCA  . Fibrocystic breast disease   . Hypercholesterolemia   . Hypertension    Past Surgical History:  Procedure Laterality Date  . APPENDECTOMY    . CHOLECYSTECTOMY    . HERNIA REPAIR    . INGUINAL HERNIA REPAIR     left x 1, right x 2  . OVARIAN CYST REMOVAL    . PARTIAL HYSTERECTOMY     Family History  Problem Relation Age of Onset  . Heart disease Father        myocardial infarction  . Heart disease Mother        myocardial infarction  . Diabetes Mother   . Congestive Heart Failure Mother   . Bone cancer Sister   . Diabetes Sister        x2  . Heart disease Sister        CABG  . Rheum arthritis Sister   . Breast cancer Neg Hx   . Colon cancer Neg Hx    Social History   Social History  . Marital status: Widowed    Spouse name: N/A  . Number of children: 3  . Years of education: N/A   Social History Main Topics  . Smoking status: Former Smoker    Quit date: 08/20/1958  . Smokeless tobacco: Never Used  .  Alcohol use No  . Drug use: No  . Sexual activity: No   Other Topics Concern  . None   Social History Narrative  . None     Review of Systems  Constitutional: Positive for fatigue. Negative for appetite change and unexpected weight change.  Respiratory: Positive for cough and shortness of breath. Negative for chest tightness.   Cardiovascular: Negative for chest pain, palpitations and leg swelling.  Gastrointestinal: Negative for abdominal pain, diarrhea, nausea and vomiting.  Genitourinary: Negative for difficulty urinating and dysuria.  Musculoskeletal: Negative for back pain and joint swelling.  Neurological: Negative for dizziness, light-headedness and headaches.  Psychiatric/Behavioral: Negative for agitation.       Some increased stress with her current medical issues.         Objective:    Physical Exam  Constitutional: She appears well-developed and well-nourished. No distress.  HENT:  Nose: Nose normal.  Mouth/Throat: Oropharynx is clear and moist.  Neck: Neck supple. No thyromegaly present.  Cardiovascular: Normal rate and regular rhythm.  Pulmonary/Chest: Breath sounds normal. No respiratory distress. She has no wheezes.  Abdominal: Soft. Bowel sounds are normal. There is no tenderness.  Musculoskeletal: She exhibits no edema or tenderness.  Lymphadenopathy:    She has no cervical adenopathy.  Skin: No rash noted. No erythema.  Psychiatric: She has a normal mood and affect. Her behavior is normal.    BP 140/68 (BP Location: Left Arm, Patient Position: Sitting, Cuff Size: Normal)   Pulse 68   Temp 98.4 F (36.9 C) (Oral)   Resp 14   Wt 151 lb 12.8 oz (68.9 kg)   SpO2 97%   BMI 24.50 kg/m  Wt Readings from Last 3 Encounters:  02/11/17 151 lb 12.8 oz (68.9 kg)  01/24/17 153 lb (69.4 kg)  01/23/17 155 lb 3.2 oz (70.4 kg)     Lab Results  Component Value Date   WBC 11.5 (H) 01/18/2017   HGB 11.7 (L) 01/18/2017   HCT 34.2 (L) 01/18/2017   PLT 364  01/18/2017   GLUCOSE 426 (H) 01/23/2017   CHOL 174 01/09/2017   TRIG 136.0 01/09/2017   HDL 41.90 01/09/2017   LDLCALC 105 (H) 01/09/2017   ALT 14 01/15/2017   AST 24 01/15/2017   NA 124 (L) 01/23/2017   K 4.9 01/23/2017   CL 90 (L) 01/23/2017   CREATININE 0.94 01/23/2017   BUN 26 (H) 01/23/2017   CO2 28 01/23/2017   TSH 1.35 04/25/2016   HGBA1C 8.0 (H) 01/09/2017   MICROALBUR 4.9 (H) 04/25/2016    Ct Angio Ao+bifem W & Or Wo Contrast  Result Date: 01/18/2017 CLINICAL DATA:  Incidental finding of atherosclerotic plaque involving the left femoral artery on DVT ultrasound. Evaluate for PAD. EXAM: CT ANGIOGRAPHY OF ABDOMINAL AORTA WITH ILIOFEMORAL RUNOFF TECHNIQUE: Multidetector CT imaging of the abdomen, pelvis and lower extremities was performed using the standard protocol during bolus administration of intravenous contrast. Multiplanar CT image reconstructions and MIPs were obtained to evaluate the vascular anatomy. CONTRAST:  125 cc Isovue 370 COMPARISON:  CT abdomen and pelvis - 03/27/2011 ; left lower extremity venous Doppler ultrasound - earlier same day ; chest CT -01/15/2017 FINDINGS: VASCULAR Aorta: Moderate amount of eccentric mixed calcified and noncalcified atherosclerotic plaque within a tortuous but normal caliber abdominal aorta, not resulting in hemodynamically significant stenosis. No abdominal aortic dissection or periaortic stranding. Celiac: There is a minimal amount of eccentric mixed calcified and noncalcified atherosclerotic plaque involving the cranial aspect of the origin the celiac artery, not resulting in hemodynamically significant stenosis. Conventional branching pattern. SMA: There is eccentric mixed calcified and noncalcified atherosclerotic plaque involving the origin and proximal aspect of the SMA which approaches approximately 50% luminal narrowing (representative axial image 42, series 4; coronal image 98, series 6). Conventional branching pattern. The distal  tributaries of the SMA are widely patent without discrete intraluminal filling defect suggest distal embolism. Renals: Solitary bilaterally there is a minimal amount of eccentric mixed calcified and noncalcified atherosclerotic plaque involving the origin of the bilateral renal arteries, not definitely resulting in a hemodynamically significant stenosis. IMA: Remains patent. RIGHT Lower Extremity Inflow: The right common, external and internal iliac arteries are mildly diseased and tortuous though patent without hemodynamically significant stenosis and of normal caliber. Outflow: There is a moderate to large amount of eccentric mixed calcified and noncalcified atherosclerotic plaque within the right common femoral artery (representative images 127 and 131, series 4) which is approaches approximately 50% luminal narrowing. The right superficial and deep femoral arteries are widely patent without  hemodynamically significant stenosis. Both the right above and below-knee popliteal arteries are widely patent without hemodynamically significant stenosis. Runoff: Three-vessel runoff to the right lower leg and foot. The right-sided dorsalis pedis artery is patent to the level of the forefoot. LEFT Lower Extremity Inflow: The left common, external and internal iliac arteries are mildly diseased and tortuous though patent without hemodynamically significant stenosis and of normal caliber. Outflow: There is a large amount of eccentric mixed calcified and noncalcified atherosclerotic plaque within the left common femoral artery (representative images 132 and 136, series 4), compatible with the findings seen on preceding DVT ultrasound and likely resulting in at least 50% luminal narrowing. The left superficial and deep femoral arteries are widely patent. Both the left above and below-knee popliteal arteries are widely patent. Runoff: Three-vessel runoff to the left lower leg and foot. The left-sided dorsalis pedis artery is  patent to the level of the forefoot. Veins: The IVC and pelvic venous system appears widely patent on this arterial phase examination. Review of the MIP images confirms the above findings. NON-VASCULAR Evaluation of abdominal organs is limited to the arterial phase of enhancement. Lower chest: Limited visualization of lower thorax demonstrates consolidative opacities within in the posteromedial and lateral basilar aspects of the right lower lobe with associated air bronchograms, progressed compared to the 01/15/2017 examination. No pleural effusion. Cardiomegaly. Coronary artery calcifications. No pericardial effusion. Hepatobiliary: Normal hepatic contour. No discrete hyperenhancing hepatic lesions. Post cholecystectomy. No intra extrahepatic bili duct dilatation. No ascites. Pancreas: Normal appearance of the pancreas Spleen: Normal appearance of the spleen. Adrenals/Urinary Tract: There is symmetric enhancement of the bilateral kidneys. No definite renal stones this postcontrast examination. No discrete renal lesions. No urine obstruction. There is a minimal amount of grossly symmetric bilateral perinephric stranding, likely age and body habitus related. No urine obstruction. Normal appearance the bilateral adrenal glands. Normal appearance of the urinary bladder given degree distention. Stomach/Bowel: Rather extensive colonic diverticulosis without evidence of diverticulitis. Normal appearance of the terminal ileum. The appendix is not visualized, however there is no pericecal inflammatory change. No pneumoperitoneum, pneumatosis or portal venous gas. Lymphatic: No bulky retroperitoneal, mesenteric, pelvic or inguinal lymphadenopathy. Reproductive: Post hysterectomy. No discrete adnexal lesion. No free fluid the pelvic cul-de-sac. Other: Post right lower ventral wall hernia repair without evidence of recurrence. Musculoskeletal: No acute or aggressive osseous abnormalities. Old moderate (approximately 40%)  compression deformity involving the superior endplate of the L1 vertebral body, similar to the 03/2011 exam. Moderate severe multilevel lumbar spine DDD, worse at L4-L5 and L5-S1 with disc space height loss, endplate irregularity and sclerosis. Bone islands are incidentally noted within the left acetabulum and right femoral head. IMPRESSION: VASCULAR 1. Moderate amount of mixed calcified and noncalcified atherosclerotic plaque within a normal caliber abdominal aorta. Aortic Atherosclerosis (ICD10-I70.0). 2. Suspected hemodynamically significant stenoses involving the bilateral common femoral arteries, left greater than right. 3. Three-vessel runoff to the bilateral lower legs and feet with dorsalis pedis artery is patent to the level of the forefeet bilaterally. 4. Suspected hemodynamically significant narrowing involving the proximal SMA, however both the celiac and IMA are widely patent without hemodynamically significant stenosis. 5. Coronary artery calcifications. NON-VASCULAR 1. Worsening right basilar consolidative opacities with associated air bronchograms, progressed compared to the 01/15/2017 examination and potentially indicative of worsening infection and/or aspiration. Clinical correlation is advised. 2. Extensive colonic diverticulosis without evidence of diverticulitis. Electronically Signed   By: Sandi Mariscal M.D.   On: 01/18/2017 16:59   US Venous Img Lower Unilateral  Left  Result Date: 01/18/2017 CLINICAL DATA:  Left lower extremity swelling. EXAM: Left LOWER EXTREMITY VENOUS DOPPLER ULTRASOUND TECHNIQUE: Gray-scale sonography with graded compression, as well as color Doppler and duplex ultrasound were performed to evaluate the lower extremity deep venous systems from the level of the common femoral vein and including the common femoral, femoral, profunda femoral, popliteal and calf veins including the posterior tibial, peroneal and gastrocnemius veins when visible. The superficial great saphenous  vein was also interrogated. Spectral Doppler was utilized to evaluate flow at rest and with distal augmentation maneuvers in the common femoral, femoral and popliteal veins. COMPARISON:  None. FINDINGS: Contralateral Common Femoral Vein: Respiratory phasicity is normal and symmetric with the symptomatic side. No evidence of thrombus. Normal compressibility. Common Femoral Vein: No evidence of thrombus. Normal compressibility, respiratory phasicity and response to augmentation. Saphenofemoral Junction: No evidence of thrombus. Normal compressibility and flow on color Doppler imaging. Profunda Femoral Vein: No evidence of thrombus. Normal compressibility and flow on color Doppler imaging. Femoral Vein: No evidence of thrombus. Normal compressibility, respiratory phasicity and response to augmentation. Popliteal Vein: No evidence of thrombus. Normal compressibility, respiratory phasicity and response to augmentation. Calf Veins: No evidence of thrombus. Normal compressibility and flow on color Doppler imaging. Venous Reflux:  None. Other Findings: 3 cm Baker's cyst is noted in left popliteal fossa. Incidental note is made of large irregular and partially mobile plaque in the left common femoral artery resulting in moderate to severe stenosis. IMPRESSION: No evidence of DVT within the left lower extremity. 3 cm Baker's cyst seen in left popliteal fossa. Large irregular and partially mobile plaque is noted in the left common femoral artery resulting in moderate to severe stenosis. This could represent an embolic source into more distal arteries and vascular surgery consultation is recommended. Electronically Signed   By: Marijo Conception, M.D.   On: 01/18/2017 10:11   Dg Chest Port 1 View  Result Date: 01/18/2017 CLINICAL DATA:  Cough.  Recent diagnosis of pneumonia. EXAM: PORTABLE CHEST 1 VIEW COMPARISON:  01/15/2017 FINDINGS: Mild cardiomegaly persists. Aortic atherosclerosis again demonstrated. Patchy bilateral  bronchopneumonia right worse than left shows improvement. No worsening or new findings. IMPRESSION: Radiographic improvement in patchy bilateral bronchopneumonia right more than left. Electronically Signed   By: Nelson Chimes M.D.   On: 01/18/2017 09:34       Assessment & Plan:   Problem List Items Addressed This Visit    Hypertension    Blood pressure has been under good control.  Same medication regimen.  Follow pressures.        Hyponatremia    Last sodium corrected to 130.  Sugars better.  Will need f/u metabolic panel.        Pneumonia - Primary    Recently admitted with sepsis/pneumonia.  Treated with abx and prednisone.  Has improved. Still with persistent cough and sob.  Check cxr.  Restart inhalers.        Relevant Medications   budesonide-formoterol (SYMBICORT) 160-4.5 MCG/ACT inhaler   albuterol (PROVENTIL HFA;VENTOLIN HFA) 108 (90 Base) MCG/ACT inhaler   benzonatate (TESSALON) 100 MG capsule   Other Relevant Orders   DG Chest 2 View (Completed)   SOB (shortness of breath)    Still some sob.  Persistent increased cough.  Recheck cxr.  She is not using inhalers.  Stopped a few days ago.  Restart symbicort and albuterol.  Tessalon perles prn.  May need pulmonary evaluation.  Hold on further abx.  Type 2 diabetes mellitus with diabetic neuropathy, without long-term current use of insulin ([facility])    Sugars better.  Off prednisone.  Low carb diet and exercise.  Follow sugars.  Hold on making changes in medication.  Follow.            Einar Pheasant, MD

## 2017-02-11 NOTE — Progress Notes (Signed)
Pre-visit discussion using our clinic review tool. No additional management support is needed unless otherwise documented below in the visit note.  

## 2017-02-11 NOTE — Telephone Encounter (Signed)
Printed for app today  

## 2017-02-11 NOTE — Telephone Encounter (Signed)
appt was made for today at 4pm

## 2017-02-11 NOTE — Telephone Encounter (Signed)
Can you let me know when app has been made.

## 2017-02-12 ENCOUNTER — Telehealth: Payer: Self-pay | Admitting: Internal Medicine

## 2017-02-12 DIAGNOSIS — E1142 Type 2 diabetes mellitus with diabetic polyneuropathy: Secondary | ICD-10-CM | POA: Diagnosis not present

## 2017-02-12 DIAGNOSIS — I2510 Atherosclerotic heart disease of native coronary artery without angina pectoris: Secondary | ICD-10-CM | POA: Diagnosis not present

## 2017-02-12 DIAGNOSIS — J189 Pneumonia, unspecified organism: Secondary | ICD-10-CM | POA: Diagnosis not present

## 2017-02-12 DIAGNOSIS — M6281 Muscle weakness (generalized): Secondary | ICD-10-CM | POA: Diagnosis not present

## 2017-02-12 DIAGNOSIS — I70212 Atherosclerosis of native arteries of extremities with intermittent claudication, left leg: Secondary | ICD-10-CM | POA: Diagnosis not present

## 2017-02-12 DIAGNOSIS — I248 Other forms of acute ischemic heart disease: Secondary | ICD-10-CM | POA: Diagnosis not present

## 2017-02-12 NOTE — Telephone Encounter (Signed)
Ok to give order? 

## 2017-02-12 NOTE — Telephone Encounter (Signed)
Called and gave the order from Dr. Nicki Reaper to [place] at number below.

## 2017-02-12 NOTE — Telephone Encounter (Signed)
Merry Proud a physical therapist from [facility] called and was looking for verbal orders. They missed a visit and would to extend it for 1 time a week every other week for 2 weeks. Please advise, thank you!  Call Jeff @ [phone]

## 2017-02-12 NOTE — Telephone Encounter (Signed)
Ok

## 2017-02-13 ENCOUNTER — Other Ambulatory Visit: Payer: Self-pay | Admitting: Internal Medicine

## 2017-02-13 ENCOUNTER — Encounter: Payer: Self-pay | Admitting: Internal Medicine

## 2017-02-13 DIAGNOSIS — R9389 Abnormal findings on diagnostic imaging of other specified body structures: Secondary | ICD-10-CM

## 2017-02-13 DIAGNOSIS — R05 Cough: Secondary | ICD-10-CM

## 2017-02-13 DIAGNOSIS — R059 Cough, unspecified: Secondary | ICD-10-CM

## 2017-02-13 NOTE — Assessment & Plan Note (Signed)
Still some sob.  Persistent increased cough.  Recheck cxr.  She is not using inhalers.  Stopped a few days ago.  Restart symbicort and albuterol.  Tessalon perles prn.  May need pulmonary evaluation.  Hold on further abx.

## 2017-02-13 NOTE — Assessment & Plan Note (Signed)
Sugars better.  Off prednisone.  Low carb diet and exercise.  Follow sugars.  Hold on making changes in medication.  Follow.

## 2017-02-13 NOTE — Assessment & Plan Note (Signed)
Blood pressure has been under good control.  Same medication regimen.  Follow pressures.   

## 2017-02-13 NOTE — Assessment & Plan Note (Signed)
Last sodium corrected to 130.  Sugars better.  Will need f/u metabolic panel.

## 2017-02-13 NOTE — Assessment & Plan Note (Signed)
Recently admitted with sepsis/pneumonia.  Treated with abx and prednisone.  Has improved. Still with persistent cough and sob.  Check cxr.  Restart inhalers.

## 2017-02-13 NOTE — Progress Notes (Signed)
Order placed for pulmonary referral.  

## 2017-02-26 DIAGNOSIS — I2510 Atherosclerotic heart disease of native coronary artery without angina pectoris: Secondary | ICD-10-CM | POA: Diagnosis not present

## 2017-02-26 DIAGNOSIS — J189 Pneumonia, unspecified organism: Secondary | ICD-10-CM | POA: Diagnosis not present

## 2017-02-26 DIAGNOSIS — I248 Other forms of acute ischemic heart disease: Secondary | ICD-10-CM | POA: Diagnosis not present

## 2017-02-26 DIAGNOSIS — M6281 Muscle weakness (generalized): Secondary | ICD-10-CM | POA: Diagnosis not present

## 2017-02-26 DIAGNOSIS — E1142 Type 2 diabetes mellitus with diabetic polyneuropathy: Secondary | ICD-10-CM | POA: Diagnosis not present

## 2017-02-26 DIAGNOSIS — I70212 Atherosclerosis of native arteries of extremities with intermittent claudication, left leg: Secondary | ICD-10-CM | POA: Diagnosis not present

## 2017-03-11 ENCOUNTER — Ambulatory Visit: Payer: Medicare Other | Admitting: Internal Medicine

## 2017-03-12 ENCOUNTER — Ambulatory Visit: Payer: Medicare Other | Admitting: Internal Medicine

## 2017-03-14 ENCOUNTER — Encounter: Payer: Self-pay | Admitting: Internal Medicine

## 2017-03-14 ENCOUNTER — Ambulatory Visit (INDEPENDENT_AMBULATORY_CARE_PROVIDER_SITE_OTHER): Payer: Medicare Other | Admitting: Internal Medicine

## 2017-03-14 VITALS — BP 136/66 | HR 74 | Temp 98.6°F | Resp 12 | Ht 66.0 in | Wt 155.0 lb

## 2017-03-14 DIAGNOSIS — E1140 Type 2 diabetes mellitus with diabetic neuropathy, unspecified: Secondary | ICD-10-CM

## 2017-03-14 DIAGNOSIS — I2489 Other forms of acute ischemic heart disease: Secondary | ICD-10-CM

## 2017-03-14 DIAGNOSIS — Z23 Encounter for immunization: Secondary | ICD-10-CM

## 2017-03-14 DIAGNOSIS — J3489 Other specified disorders of nose and nasal sinuses: Secondary | ICD-10-CM | POA: Diagnosis not present

## 2017-03-14 DIAGNOSIS — I248 Other forms of acute ischemic heart disease: Secondary | ICD-10-CM | POA: Diagnosis not present

## 2017-03-14 DIAGNOSIS — K3184 Gastroparesis: Secondary | ICD-10-CM | POA: Diagnosis not present

## 2017-03-14 DIAGNOSIS — I10 Essential (primary) hypertension: Secondary | ICD-10-CM

## 2017-03-14 DIAGNOSIS — R0602 Shortness of breath: Secondary | ICD-10-CM | POA: Diagnosis not present

## 2017-03-14 DIAGNOSIS — E871 Hypo-osmolality and hyponatremia: Secondary | ICD-10-CM | POA: Diagnosis not present

## 2017-03-14 DIAGNOSIS — E7800 Pure hypercholesterolemia, unspecified: Secondary | ICD-10-CM | POA: Diagnosis not present

## 2017-03-14 NOTE — Patient Instructions (Signed)
Take amaryl (glimepiride) 1 tablet with breakfast and 1/2 tablet with your evening meal.

## 2017-03-14 NOTE — Progress Notes (Signed)
Pre-visit discussion using our clinic review tool. No additional management support is needed unless otherwise documented below in the visit note.  

## 2017-03-14 NOTE — Progress Notes (Signed)
Patient ID: Debra Kirk, female   DOB: [birth year], 83 y.o.   MRN: [id number]   Subjective:    Patient ID: Debra Kirk, female    DOB: 1930/05/23, 83 y.o.   MRN: [id number]  HPI  Patient here for a scheduled follow up.  She was admitted 01/15/17 with bilateral pneumonia and sepsis.  Treated with IV fluis and IV abx.  Completed outpatient abx and prednisone.  See previous note for details.  She is doing better.  Energy has improved.  She is eating.  No chest pain.  Still with some fatigue, but has improved. No increased cough.  No acid reflux.  No abdominal pain. Bowels moving.  Recent cxr with no acute infiltrate.  Some interstitial changes.  Blood sugars elevated.  AM sugars averaging 180-210.  PM sugars averaging 150-210.  Discussed low carb diet and exercise as tolerated.  She does report some increased drainage and ears stopped up - at times. No ear ache.  No fever. No significant sinus symptoms.     Past Medical History:  Diagnosis Date  . Carotid artery disease ([place])   . Compression fracture    s/p fosamax, prolia  . Diabetes mellitus ([place])   . Femoral neuropathy    left, elevated CRP, ESR, negative FANA and ANCA  . Fibrocystic breast disease   . Hypercholesterolemia   . Hypertension    Past Surgical History:  Procedure Laterality Date  . APPENDECTOMY    . CHOLECYSTECTOMY    . HERNIA REPAIR    . INGUINAL HERNIA REPAIR     left x 1, right x 2  . OVARIAN CYST REMOVAL    . PARTIAL HYSTERECTOMY     Family History  Problem Relation Age of Onset  . Heart disease Father        myocardial infarction  . Heart disease Mother        myocardial infarction  . Diabetes Mother   . Congestive Heart Failure Mother   . Bone cancer Sister   . Diabetes Sister        x2  . Heart disease Sister        CABG  . Rheum arthritis Sister   . Breast cancer Neg Hx   . Colon cancer Neg Hx    Social History   Social History  . Marital status: Widowed    Spouse name: N/A  . Number  of children: 3  . Years of education: N/A   Social History Main Topics  . Smoking status: Former Smoker    Quit date: 08/20/1958  . Smokeless tobacco: Never Used  . Alcohol use No  . Drug use: No  . Sexual activity: No   Other Topics Concern  . None   Social History Narrative  . None    Outpatient Encounter Prescriptions as of 03/14/2017  Medication Sig  . albuterol (PROVENTIL HFA;VENTOLIN HFA) 108 (90 Base) MCG/ACT inhaler Inhale 2 puffs into the lungs every 6 (six) hours as needed for wheezing or shortness of breath.  Marland Kitchen amLODipine (NORVASC) 5 MG tablet TAKE 1 TABLET BY MOUTH DAILY  . aspirin 81 MG tablet Take 81 mg by mouth daily.  Marland Kitchen BAYER CONTOUR TEST test strip CHECK DAILY AND AS NEEDED **E11.9**  . Calcium Carbonate-Vitamin D (CALCIUM 600+D) 600-400 MG-UNIT per tablet Take 1 tablet by mouth 2 (two) times daily.  Marland Kitchen denosumab (PROLIA) 60 MG/ML SOLN injection Inject 60 mg into the skin every 6 (six) months. Administer in upper arm, thigh,  or abdomen  . glimepiride (AMARYL) 2 MG tablet TAKE 1 TABLET BY MOUTH EVERY DAY WITH BREAKFAST  . levofloxacin (LEVAQUIN) 750 MG tablet Take 1 tablet (750 mg total) by mouth every other day.  . meclizine (ANTIVERT) 12.5 MG tablet Take 1 tablet (12.5 mg total) by mouth 2 (two) times daily as needed for dizziness.  . metoCLOPramide (REGLAN) 5 MG tablet Take 5 mg by mouth 2 (two) times daily.   . metoprolol (LOPRESSOR) 50 MG tablet TAKE 1 AND 1/2 TABLET BY MOUTH TWICE A DAY  . omeprazole (PRILOSEC) 20 MG capsule TAKE ONE CAPSULE BY MOUTH TWICE A DAY  . rosuvastatin (CRESTOR) 5 MG tablet TAKE 1 TABLET (5 MG TOTAL) BY MOUTH DAILY.  Marland Kitchen sertraline (ZOLOFT) 50 MG tablet TAKE 1 TABLET (50 MG TOTAL) BY MOUTH DAILY.  . [DISCONTINUED] albuterol (PROVENTIL HFA;VENTOLIN HFA) 108 (90 Base) MCG/ACT inhaler Inhale 2 puffs into the lungs every 6 (six) hours as needed for wheezing or shortness of breath.  . [DISCONTINUED] benzonatate (TESSALON) 100 MG capsule Take 1  capsule (100 mg total) by mouth 3 (three) times daily as needed for cough.  . [DISCONTINUED] budesonide-formoterol (SYMBICORT) 160-4.5 MCG/ACT inhaler Inhale 2 puffs into the lungs 2 (two) times daily.  . [DISCONTINUED] guaiFENesin-codeine 100-10 MG/5ML syrup Take 10 mLs by mouth every 4 (four) hours as needed for cough.   No facility-administered encounter medications on file as of 03/14/2017.     Review of Systems  Constitutional: Negative for appetite change and unexpected weight change.  HENT: Positive for postnasal drip. Negative for sinus pressure.        Some intermittent ear fullness.   Respiratory: Negative for cough and chest tightness.   Cardiovascular: Negative for chest pain, palpitations and leg swelling.  Gastrointestinal: Negative for abdominal pain, diarrhea, nausea and vomiting.  Genitourinary: Negative for difficulty urinating and dysuria.  Musculoskeletal: Negative for joint swelling and myalgias.  Skin: Negative for color change and rash.  Neurological: Negative for dizziness, light-headedness and headaches.  Psychiatric/Behavioral: Negative for agitation and dysphoric mood.       Objective:    Physical Exam  Constitutional: She appears well-developed and well-nourished. No distress.  HENT:  Nose: Nose normal.  Mouth/Throat: Oropharynx is clear and moist.  Neck: Neck supple. No thyromegaly present.  Cardiovascular: Normal rate and regular rhythm.   Pulmonary/Chest: Breath sounds normal. No respiratory distress. She has no wheezes.  Abdominal: Soft. Bowel sounds are normal. There is no tenderness.  Musculoskeletal: She exhibits no edema or tenderness.  Lymphadenopathy:    She has no cervical adenopathy.  Skin: No rash noted. No erythema.  Psychiatric: She has a normal mood and affect. Her behavior is normal.    BP 136/66 (BP Location: Left Arm, Patient Position: Sitting, Cuff Size: Normal)   Pulse 74   Temp 98.6 F (37 C) (Oral)   Resp 12   Ht 5' 6"   (1.676 m)   Wt 155 lb (70.3 kg)   SpO2 95%   BMI 25.02 kg/m  Wt Readings from Last 3 Encounters:  03/14/17 155 lb (70.3 kg)  02/11/17 151 lb 12.8 oz (68.9 kg)  01/24/17 153 lb (69.4 kg)     Lab Results  Component Value Date   WBC 7.2 03/14/2017   HGB 12.8 03/14/2017   HCT 37.6 03/14/2017   PLT 223.0 03/14/2017   GLUCOSE 164 (H) 03/14/2017   CHOL 174 01/09/2017   TRIG 136.0 01/09/2017   HDL 41.90 01/09/2017   LDLCALC 105 (H)  01/09/2017   ALT 14 01/15/2017   AST 24 01/15/2017   NA 134 (L) 03/14/2017   K 4.4 03/14/2017   CL 99 03/14/2017   CREATININE 1.06 03/14/2017   BUN 16 03/14/2017   CO2 27 03/14/2017   TSH 1.35 04/25/2016   HGBA1C 8.0 (H) 01/09/2017   MICROALBUR 4.9 (H) 04/25/2016    Ct Angio Ao+bifem W & Or Wo Contrast  Result Date: 01/18/2017 CLINICAL DATA:  Incidental finding of atherosclerotic plaque involving the left femoral artery on DVT ultrasound. Evaluate for PAD. EXAM: CT ANGIOGRAPHY OF ABDOMINAL AORTA WITH ILIOFEMORAL RUNOFF TECHNIQUE: Multidetector CT imaging of the abdomen, pelvis and lower extremities was performed using the standard protocol during bolus administration of intravenous contrast. Multiplanar CT image reconstructions and MIPs were obtained to evaluate the vascular anatomy. CONTRAST:  125 cc Isovue 370 COMPARISON:  CT abdomen and pelvis - 03/27/2011 ; left lower extremity venous Doppler ultrasound - earlier same day ; chest CT -01/15/2017 FINDINGS: VASCULAR Aorta: Moderate amount of eccentric mixed calcified and noncalcified atherosclerotic plaque within a tortuous but normal caliber abdominal aorta, not resulting in hemodynamically significant stenosis. No abdominal aortic dissection or periaortic stranding. Celiac: There is a minimal amount of eccentric mixed calcified and noncalcified atherosclerotic plaque involving the cranial aspect of the origin the celiac artery, not resulting in hemodynamically significant stenosis. Conventional branching  pattern. SMA: There is eccentric mixed calcified and noncalcified atherosclerotic plaque involving the origin and proximal aspect of the SMA which approaches approximately 50% luminal narrowing (representative axial image 42, series 4; coronal image 98, series 6). Conventional branching pattern. The distal tributaries of the SMA are widely patent without discrete intraluminal filling defect suggest distal embolism. Renals: Solitary bilaterally there is a minimal amount of eccentric mixed calcified and noncalcified atherosclerotic plaque involving the origin of the bilateral renal arteries, not definitely resulting in a hemodynamically significant stenosis. IMA: Remains patent. RIGHT Lower Extremity Inflow: The right common, external and internal iliac arteries are mildly diseased and tortuous though patent without hemodynamically significant stenosis and of normal caliber. Outflow: There is a moderate to large amount of eccentric mixed calcified and noncalcified atherosclerotic plaque within the right common femoral artery (representative images 127 and 131, series 4) which is approaches approximately 50% luminal narrowing. The right superficial and deep femoral arteries are widely patent without hemodynamically significant stenosis. Both the right above and below-knee popliteal arteries are widely patent without hemodynamically significant stenosis. Runoff: Three-vessel runoff to the right lower leg and foot. The right-sided dorsalis pedis artery is patent to the level of the forefoot. LEFT Lower Extremity Inflow: The left common, external and internal iliac arteries are mildly diseased and tortuous though patent without hemodynamically significant stenosis and of normal caliber. Outflow: There is a large amount of eccentric mixed calcified and noncalcified atherosclerotic plaque within the left common femoral artery (representative images 132 and 136, series 4), compatible with the findings seen on preceding DVT  ultrasound and likely resulting in at least 50% luminal narrowing. The left superficial and deep femoral arteries are widely patent. Both the left above and below-knee popliteal arteries are widely patent. Runoff: Three-vessel runoff to the left lower leg and foot. The left-sided dorsalis pedis artery is patent to the level of the forefoot. Veins: The IVC and pelvic venous system appears widely patent on this arterial phase examination. Review of the MIP images confirms the above findings. NON-VASCULAR Evaluation of abdominal organs is limited to the arterial phase of enhancement. Lower chest: Limited visualization of  lower thorax demonstrates consolidative opacities within in the posteromedial and lateral basilar aspects of the right lower lobe with associated air bronchograms, progressed compared to the 01/15/2017 examination. No pleural effusion. Cardiomegaly. Coronary artery calcifications. No pericardial effusion. Hepatobiliary: Normal hepatic contour. No discrete hyperenhancing hepatic lesions. Post cholecystectomy. No intra extrahepatic bili duct dilatation. No ascites. Pancreas: Normal appearance of the pancreas Spleen: Normal appearance of the spleen. Adrenals/Urinary Tract: There is symmetric enhancement of the bilateral kidneys. No definite renal stones this postcontrast examination. No discrete renal lesions. No urine obstruction. There is a minimal amount of grossly symmetric bilateral perinephric stranding, likely age and body habitus related. No urine obstruction. Normal appearance the bilateral adrenal glands. Normal appearance of the urinary bladder given degree distention. Stomach/Bowel: Rather extensive colonic diverticulosis without evidence of diverticulitis. Normal appearance of the terminal ileum. The appendix is not visualized, however there is no pericecal inflammatory change. No pneumoperitoneum, pneumatosis or portal venous gas. Lymphatic: No bulky retroperitoneal, mesenteric, pelvic or  inguinal lymphadenopathy. Reproductive: Post hysterectomy. No discrete adnexal lesion. No free fluid the pelvic cul-de-sac. Other: Post right lower ventral wall hernia repair without evidence of recurrence. Musculoskeletal: No acute or aggressive osseous abnormalities. Old moderate (approximately 40%) compression deformity involving the superior endplate of the L1 vertebral body, similar to the 03/2011 exam. Moderate severe multilevel lumbar spine DDD, worse at L4-L5 and L5-S1 with disc space height loss, endplate irregularity and sclerosis. Bone islands are incidentally noted within the left acetabulum and right femoral head. IMPRESSION: VASCULAR 1. Moderate amount of mixed calcified and noncalcified atherosclerotic plaque within a normal caliber abdominal aorta. Aortic Atherosclerosis (ICD10-I70.0). 2. Suspected hemodynamically significant stenoses involving the bilateral common femoral arteries, left greater than right. 3. Three-vessel runoff to the bilateral lower legs and feet with dorsalis pedis artery is patent to the level of the forefeet bilaterally. 4. Suspected hemodynamically significant narrowing involving the proximal SMA, however both the celiac and IMA are widely patent without hemodynamically significant stenosis. 5. Coronary artery calcifications. NON-VASCULAR 1. Worsening right basilar consolidative opacities with associated air bronchograms, progressed compared to the 01/15/2017 examination and potentially indicative of worsening infection and/or aspiration. Clinical correlation is advised. 2. Extensive colonic diverticulosis without evidence of diverticulitis. Electronically Signed   By: Sandi Mariscal M.D.   On: 01/18/2017 16:59   US Venous Img Lower Unilateral Left  Result Date: 01/18/2017 CLINICAL DATA:  Left lower extremity swelling. EXAM: Left LOWER EXTREMITY VENOUS DOPPLER ULTRASOUND TECHNIQUE: Gray-scale sonography with graded compression, as well as color Doppler and duplex ultrasound  were performed to evaluate the lower extremity deep venous systems from the level of the common femoral vein and including the common femoral, femoral, profunda femoral, popliteal and calf veins including the posterior tibial, peroneal and gastrocnemius veins when visible. The superficial great saphenous vein was also interrogated. Spectral Doppler was utilized to evaluate flow at rest and with distal augmentation maneuvers in the common femoral, femoral and popliteal veins. COMPARISON:  None. FINDINGS: Contralateral Common Femoral Vein: Respiratory phasicity is normal and symmetric with the symptomatic side. No evidence of thrombus. Normal compressibility. Common Femoral Vein: No evidence of thrombus. Normal compressibility, respiratory phasicity and response to augmentation. Saphenofemoral Junction: No evidence of thrombus. Normal compressibility and flow on color Doppler imaging. Profunda Femoral Vein: No evidence of thrombus. Normal compressibility and flow on color Doppler imaging. Femoral Vein: No evidence of thrombus. Normal compressibility, respiratory phasicity and response to augmentation. Popliteal Vein: No evidence of thrombus. Normal compressibility, respiratory phasicity and response to augmentation.  Calf Veins: No evidence of thrombus. Normal compressibility and flow on color Doppler imaging. Venous Reflux:  None. Other Findings: 3 cm Baker's cyst is noted in left popliteal fossa. Incidental note is made of large irregular and partially mobile plaque in the left common femoral artery resulting in moderate to severe stenosis. IMPRESSION: No evidence of DVT within the left lower extremity. 3 cm Baker's cyst seen in left popliteal fossa. Large irregular and partially mobile plaque is noted in the left common femoral artery resulting in moderate to severe stenosis. This could represent an embolic source into more distal arteries and vascular surgery consultation is recommended. Electronically Signed   By:  Marijo Conception, M.D.   On: 01/18/2017 10:11   Dg Chest Port 1 View  Result Date: 01/18/2017 CLINICAL DATA:  Cough.  Recent diagnosis of pneumonia. EXAM: PORTABLE CHEST 1 VIEW COMPARISON:  01/15/2017 FINDINGS: Mild cardiomegaly persists. Aortic atherosclerosis again demonstrated. Patchy bilateral bronchopneumonia right worse than left shows improvement. No worsening or new findings. IMPRESSION: Radiographic improvement in patchy bilateral bronchopneumonia right more than left. Electronically Signed   By: Nelson Chimes M.D.   On: 01/18/2017 09:34       Assessment & Plan:   Problem List Items Addressed This Visit    Demand ischemia [facility])    Had slightly elevated troponin in the hospital.  Felt to be demand ischemia.  No further cardiac w/up felt warranted.  Overall improved.  Follow.  Continue risk factor modification.        Gastroparesis    Followed by GI.  Remains on reglan.        Hypercholesterolemia    On crestor.  Low cholesterol diet and exercise.  Follow lipid panel and liver function tests.        Relevant Orders   Lipid panel   Hepatic function panel   Hypertension    Blood pressure under good control.  Continue same medication regimen.  Follow pressures.  Follow metabolic panel.        Relevant Orders   TSH   Hyponatremia - Primary    Corrected previously with elevated sugar - calculation.  Recheck sodium today.        Relevant Orders   CBC with Differential/Platelet (Completed)   SOB (shortness of breath)    Recently admitted with pneumonia.  Treated.  Is better.  Still some fatigue and sob with increased exertion, but has improved.  cxr with clearing of infiltrates.  Some interstitial changes noted.  Has appt with pulmonary for further evaluation and testing.  Discussed PFTs, etc.        Type 2 diabetes mellitus with diabetic neuropathy, without long-term current use of insulin (HCC)    Sugars are improved when compared to taking prednisone and when sick, but  still elevated.  Increase amaryl to 14m in the am and 1/2 tablet with supper.  She will continue to check and record sugars and send in readings over the next few weeks.  Follow met b and a1c.        Relevant Orders   CBC with Differential/Platelet (Completed)   Basic metabolic panel (Completed)   Hemoglobin AC3J  Basic metabolic panel   Microalbumin / creatinine urine ratio    Other Visit Diagnoses    Drainage from nose       Increased drainage and ear fullness.  Nasal sprays as directed.  Follow.        SEinar Pheasant MD

## 2017-03-15 LAB — CBC WITH DIFFERENTIAL/PLATELET
BASOS ABS: 0.1 10*3/uL (ref 0.0–0.1)
Basophils Relative: 1.5 % (ref 0.0–3.0)
Eosinophils Absolute: 0 10*3/uL (ref 0.0–0.7)
Eosinophils Relative: 0.1 % (ref 0.0–5.0)
HEMATOCRIT: 37.6 % (ref 36.0–46.0)
HEMOGLOBIN: 12.8 g/dL (ref 12.0–15.0)
LYMPHS ABS: 3.2 10*3/uL (ref 0.7–4.0)
LYMPHS PCT: 44.8 % (ref 12.0–46.0)
MCHC: 34 g/dL (ref 30.0–36.0)
MCV: 88.3 fl (ref 78.0–100.0)
MONOS PCT: 8 % (ref 3.0–12.0)
Monocytes Absolute: 0.6 10*3/uL (ref 0.1–1.0)
NEUTROS PCT: 45.6 % (ref 43.0–77.0)
Neutro Abs: 3.3 10*3/uL (ref 1.4–7.7)
Platelets: 223 10*3/uL (ref 150.0–400.0)
RBC: 4.26 Mil/uL (ref 3.87–5.11)
RDW: 15.9 % — ABNORMAL HIGH (ref 11.5–15.5)
WBC: 7.2 10*3/uL (ref 4.0–10.5)

## 2017-03-15 LAB — BASIC METABOLIC PANEL
BUN: 16 mg/dL (ref 6–23)
CHLORIDE: 99 meq/L (ref 96–112)
CO2: 27 meq/L (ref 19–32)
CREATININE: 1.06 mg/dL (ref 0.40–1.20)
Calcium: 9.3 mg/dL (ref 8.4–10.5)
GFR: 52.32 mL/min — ABNORMAL LOW (ref >60.00)
GLUCOSE: 164 mg/dL — AB (ref 70–99)
Potassium: 4.4 mEq/L (ref 3.5–5.1)
Sodium: 134 mEq/L — ABNORMAL LOW (ref 135–145)

## 2017-03-17 ENCOUNTER — Encounter: Payer: Self-pay | Admitting: Internal Medicine

## 2017-03-17 NOTE — Assessment & Plan Note (Signed)
Corrected previously with elevated sugar - calculation.  Recheck sodium today.

## 2017-03-17 NOTE — Assessment & Plan Note (Signed)
Followed by GI.  Remains on reglan.   

## 2017-03-17 NOTE — Assessment & Plan Note (Signed)
Recently admitted with pneumonia.  Treated.  Is better.  Still some fatigue and sob with increased exertion, but has improved.  cxr with clearing of infiltrates.  Some interstitial changes noted.  Has appt with pulmonary for further evaluation and testing.  Discussed PFTs, etc.

## 2017-03-17 NOTE — Assessment & Plan Note (Signed)
Blood pressure under good control.  Continue same medication regimen.  Follow pressures.  Follow metabolic panel.   

## 2017-03-17 NOTE — Assessment & Plan Note (Signed)
Sugars are improved when compared to taking prednisone and when sick, but still elevated.  Increase amaryl to 38m in the am and 1/2 tablet with supper.  She will continue to check and record sugars and send in readings over the next few weeks.  Follow met b and a1c.

## 2017-03-17 NOTE — Assessment & Plan Note (Signed)
On crestor.  Low cholesterol diet and exercise.  Follow lipid panel and liver function tests.   

## 2017-03-17 NOTE — Assessment & Plan Note (Signed)
Had slightly elevated troponin in the hospital.  Felt to be demand ischemia.  No further cardiac w/up felt warranted.  Overall improved.  Follow.  Continue risk factor modification.

## 2017-03-19 ENCOUNTER — Encounter: Payer: Self-pay | Admitting: Internal Medicine

## 2017-03-19 ENCOUNTER — Ambulatory Visit (INDEPENDENT_AMBULATORY_CARE_PROVIDER_SITE_OTHER): Payer: Medicare Other | Admitting: Internal Medicine

## 2017-03-19 VITALS — BP 138/86 | HR 75 | Resp 16 | Ht 66.0 in | Wt 154.0 lb

## 2017-03-19 DIAGNOSIS — J181 Lobar pneumonia, unspecified organism: Secondary | ICD-10-CM

## 2017-03-19 DIAGNOSIS — J479 Bronchiectasis, uncomplicated: Secondary | ICD-10-CM

## 2017-03-19 DIAGNOSIS — I2510 Atherosclerotic heart disease of native coronary artery without angina pectoris: Secondary | ICD-10-CM | POA: Insufficient documentation

## 2017-03-19 DIAGNOSIS — I248 Other forms of acute ischemic heart disease: Secondary | ICD-10-CM

## 2017-03-19 DIAGNOSIS — IMO0002 Reserved for concepts with insufficient information to code with codable children: Secondary | ICD-10-CM

## 2017-03-19 DIAGNOSIS — G5722 Lesion of femoral nerve, left lower limb: Secondary | ICD-10-CM | POA: Insufficient documentation

## 2017-03-19 DIAGNOSIS — S32010A Wedge compression fracture of first lumbar vertebra, initial encounter for closed fracture: Secondary | ICD-10-CM | POA: Insufficient documentation

## 2017-03-19 DIAGNOSIS — N6019 Diffuse cystic mastopathy of unspecified breast: Secondary | ICD-10-CM | POA: Insufficient documentation

## 2017-03-19 MED ORDER — FLUTTER DEVI: 0 refills | Status: AC

## 2017-03-19 MED ORDER — AMBULATORY NON FORMULARY MEDICATION: 0 refills | Status: AC

## 2017-03-19 NOTE — Progress Notes (Signed)
Name: Debra Kirk MRN: [id number] DOB: 09/01/1929     CONSULTATION DATE: 03/19/17 REFERRING MD :  Dr. Einar Pheasant CHIEF COMPLAINT:  Cough  STUDIES:  CT chest on 01/15/2017 reviewed on 03/19/17 Interpretation patient has bilateral upper lobe and lower lobe bronchiectasis Patient also has bilateral lower lobe patchy infiltrates   HISTORY OF PRESENT ILLNESS:   83 year old pleasant white female seen today for a history and diagnosis of pneumonia In May 2018 patient was diagnosed with pneumonia and had associated symptoms of coughing and fevers with extreme fatigue and lethargy patient subsequently went to urgent care received oral antibiotics and cough medicine and it did not help patient subsequently had progressive restaurant distress and was admitted for pneumonia and she was in the hospital for approximately 1 week Patient had CT of the chest which showed bilateral patchy infiltrative disease along with bronchiectasis and what looks like areas of pneumonia Patient has intermittent cough at this time No shortness of breath no chest pain at this time Her symptoms are much improved She has intermittent lethargy but overall has much improved  Patient was diagnosed with pneumonia 20 years ago Patient is a nonsmoker however she has extensive exposure to secondhand smoke 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  has a past surgical history that includes Partial hysterectomy; Inguinal hernia repair; Appendectomy; Ovarian cyst removal; Cholecystectomy; and Hernia repair. Prior to Admission medications   Medication Sig Start Date End Date Taking? Authorizing Provider    amLODipine (NORVASC) 5 MG tablet TAKE 1 TABLET BY MOUTH DAILY 11/01/16  Yes Einar Pheasant, MD  aspirin 81 MG tablet Take 81 mg by mouth daily.   Yes [provider]  BAYER CONTOUR TEST test strip CHECK DAILY AND AS NEEDED **E11.9** 10/03/16  Yes Einar Pheasant, MD  Calcium Carbonate-Vitamin D (CALCIUM 600+D) 600-400 MG-UNIT per tablet Take 1 tablet by mouth 2 (two) times daily.   Yes [provider]  denosumab (PROLIA) 60 MG/ML SOLN injection Inject 60 mg into the skin every 6 (six) months. Administer in upper arm, thigh, or abdomen   Yes [provider]  glimepiride (AMARYL) 2 MG tablet TAKE 1 TABLET BY MOUTH EVERY DAY WITH BREAKFAST 02/11/17  Yes Einar Pheasant, MD  meclizine (ANTIVERT) 12.5 MG tablet Take 1 tablet (12.5 mg total) by mouth 2 (two) times daily as needed for dizziness. 01/30/16  Yes Einar Pheasant, MD  metoCLOPramide (REGLAN) 5 MG tablet Take 5 mg by mouth 2 (two) times daily.    Yes [provider]  metoprolol (LOPRESSOR) 50 MG tablet TAKE 1 AND 1/2 TABLET BY MOUTH TWICE A DAY 11/12/16  Yes Einar Pheasant, MD  omeprazole (PRILOSEC) 20 MG capsule TAKE ONE CAPSULE BY MOUTH TWICE A DAY 11/14/15  Yes Einar Pheasant, MD  rosuvastatin (CRESTOR) 5 MG tablet TAKE 1 TABLET (5 MG TOTAL) BY MOUTH DAILY. 11/12/16  Yes Einar Pheasant, MD  albuterol (PROVENTIL HFA;VENTOLIN HFA) 108 (90 Base) MCG/ACT inhaler Inhale 2 puffs into the lungs every 6 (six) hours as needed for wheezing or shortness of breath.    [provider]   Allergies  Allergen Reactions  . Penicillins Rash    .pcn    FAMILY  HISTORY:  family history includes Bone cancer in her sister; Congestive Heart Failure in her mother; Diabetes in her mother and sister; Heart disease in her father, mother, and sister; Rheum arthritis in her sister. SOCIAL HISTORY:  reports that she quit smoking about 58 years ago. She has never used smokeless tobacco. She reports that she does not drink  alcohol or use drugs.  REVIEW OF SYSTEMS:   Constitutional: Negative for fever, chills, weight loss, malaise/fatigue and diaphoresis.  HENT: Negative for hearing loss, ear pain, nosebleeds, congestion, sore throat, neck pain, tinnitus and ear discharge.   Eyes: Negative for blurred vision, double vision, photophobia, pain, discharge and redness.  Respiratory: Negative for cough, hemoptysis, +sputum production, shortness of breath, wheezing and stridor.   Cardiovascular: Negative for chest pain, palpitations, orthopnea, claudication, leg swelling and PND.  Gastrointestinal: Negative for heartburn, nausea, vomiting, abdominal pain, diarrhea, constipation, blood in stool and melena.  Genitourinary: Negative for dysuria, urgency, frequency, hematuria and flank pain.  Musculoskeletal: Negative for myalgias, back pain, joint pain and falls.  Skin: Negative for itching and rash.  Neurological: Negative for dizziness, tingling, tremors, sensory change, speech change, focal weakness, seizures, loss of consciousness, weakness and headaches.  Endo/Heme/Allergies: Negative for environmental allergies and polydipsia. Does not bruise/bleed easily.  ALL OTHER ROS ARE NEGATIVE  BP 138/86 (BP Location: Left Arm, Cuff Size: Normal)   Pulse 75   Resp 16   Ht 5\' 6"  (1.676 m)   Wt 154 lb (69.9 kg)   SpO2 92%   BMI 24.86 kg/m   Physical Examination:   GENERAL:NAD, no fevers, chills, no weakness no fatigue HEAD: Normocephalic, atraumatic.  EYES: Pupils equal, round, reactive to light. Extraocular muscles intact. No scleral icterus.  MOUTH: Moist mucosal membrane.   EAR, NOSE, THROAT: Clear without exudates. No external lesions.  NECK: Supple. No thyromegaly. No nodules. No JVD.  PULMONARY:CTA B/L no wheezes, no crackles, + rhonchi CARDIOVASCULAR: S1 and S2. Regular rate and rhythm. No murmurs, rubs, or gallops. No edema.  GASTROINTESTINAL: Soft, nontender, nondistended. No masses. Positive bowel sounds.   MUSCULOSKELETAL: No swelling, clubbing, or edema. Range of motion full in all extremities.  NEUROLOGIC: Cranial nerves II through XII are intact. No gross focal neurological deficits.  SKIN: No ulceration, lesions, rashes, or cyanosis. Skin warm and dry. Turgor intact.  PSYCHIATRIC: Mood, affect within normal limits. The patient is awake, alert and oriented x 3. Insight, judgment intact.       Recent Labs Lab 03/14/17 1558  NA 134*  K 4.4  CL 99  CO2 27  BUN 16  CREATININE 1.06  GLUCOSE 164*    Recent Labs Lab 03/14/17 1558  HGB 12.8  HCT 37.6  WBC 7.2  PLT 223.0   No results found.  ASSESSMENT / PLAN:   83 year old pleasant white female seen today for a recent diagnosis of pneumonia and respiratory failure was admitted to the hospital and since her discharge she has remarkably improved with antibiotics and bronchodilator therapy and after further review of her CAT scan she has had bilateral interstitial opacities consistent with bilateral pneumonia in the setting of bilateral bronchiectasis  At this time she has no acute signs of infection at this time and has improving respiratory symptoms since her discharge from the hospital  At this time the recommendations I have is to start incentive spirometry therapy approximately 10-15 times per day along with flutter valve therapy for 15 times per day I have also advised to use her albuterol as  needed I have also recommended a follow-up CAT scan of her chest without contrast in 3 months to assess for interval changes   Patient/Family are satisfied with Plan of action and management. All questions answered Follow-up in 3 months   Zahava Quant Patricia Pesa, M.D.  [facility]  Medical Director [place] Director [facility] Cardio-Pulmonary Department

## 2017-03-19 NOTE — Addendum Note (Signed)
Addended by: Oscar La R on: 03/19/2017 03:11 PM   Modules accepted: Orders

## 2017-03-19 NOTE — Patient Instructions (Signed)
Follow-up in 3 months with CT of the chest Incentive spirometry 10-15 times per day  flutter valve 10-15 times per day Albuterol as needed

## 2017-05-07 ENCOUNTER — Other Ambulatory Visit: Payer: Self-pay | Admitting: Internal Medicine

## 2017-05-07 DIAGNOSIS — Z1231 Encounter for screening mammogram for malignant neoplasm of breast: Secondary | ICD-10-CM

## 2017-05-09 ENCOUNTER — Other Ambulatory Visit: Payer: Self-pay

## 2017-05-09 ENCOUNTER — Telehealth: Payer: Self-pay | Admitting: Internal Medicine

## 2017-05-09 ENCOUNTER — Telehealth: Payer: Self-pay

## 2017-05-09 MED ORDER — GLIMEPIRIDE 2 MG PO TABS: 2.0000 mg | ORAL_TABLET | Freq: Two times a day (BID) | ORAL | 0 refills | Status: DC

## 2017-05-09 NOTE — Telephone Encounter (Signed)
Received a fax stating that Pt is to be on 1 tab in AM and 1 tab at supper, but per your note on 7/26 Pt is to take 1 tab in AM and 1/2 at night. Please advise

## 2017-05-09 NOTE — Telephone Encounter (Signed)
Left pt message asking to call Ebony Hail back directly at [phone] to schedule AWV. Thanks!  *NOTE* Last AWV 09/05/15

## 2017-05-09 NOTE — Telephone Encounter (Signed)
Please confirm with pt what she is taking and how sugars have been running.  Continue on the dose she has been taking.

## 2017-05-09 NOTE — Telephone Encounter (Signed)
Ok, please change medication on our list and if needs refill, ok to call in with dose taking.

## 2017-05-09 NOTE — Telephone Encounter (Signed)
Patient called back states that her blood sugars are still kind of high running between 100-200. She is taking 1 tab in am and one at dinner. She has f/u on 10/15 and will bring log in then but did not have when she called.

## 2017-05-09 NOTE — Telephone Encounter (Signed)
Script changed on our list and called in.

## 2017-05-09 NOTE — Telephone Encounter (Signed)
Left message to return call to our office.  

## 2017-05-14 ENCOUNTER — Other Ambulatory Visit: Payer: Self-pay | Admitting: Internal Medicine

## 2017-05-29 ENCOUNTER — Other Ambulatory Visit (INDEPENDENT_AMBULATORY_CARE_PROVIDER_SITE_OTHER): Payer: Medicare Other

## 2017-05-29 DIAGNOSIS — E7800 Pure hypercholesterolemia, unspecified: Secondary | ICD-10-CM | POA: Diagnosis not present

## 2017-05-29 DIAGNOSIS — I10 Essential (primary) hypertension: Secondary | ICD-10-CM

## 2017-05-29 DIAGNOSIS — E1140 Type 2 diabetes mellitus with diabetic neuropathy, unspecified: Secondary | ICD-10-CM | POA: Diagnosis not present

## 2017-05-29 LAB — BASIC METABOLIC PANEL
BUN: 17 mg/dL (ref 6–23)
CALCIUM: 9.7 mg/dL (ref 8.4–10.5)
CO2: 29 mEq/L (ref 19–32)
Chloride: 99 mEq/L (ref 96–112)
Creatinine, Ser: 1 mg/dL (ref 0.40–1.20)
GFR: 55.93 mL/min — AB (ref >60.00)
GLUCOSE: 214 mg/dL — AB (ref 70–99)
POTASSIUM: 3.9 meq/L (ref 3.5–5.1)
Sodium: 137 mEq/L (ref 135–145)

## 2017-05-29 LAB — MICROALBUMIN / CREATININE URINE RATIO
Creatinine,U: 222.7 mg/dL
Microalb Creat Ratio: 1.8 mg/g (ref 0.0–30.0)
Microalb, Ur: 4.1 mg/dL — ABNORMAL HIGH (ref 0.0–1.9)

## 2017-05-29 LAB — LIPID PANEL
CHOLESTEROL: 180 mg/dL (ref 0–200)
HDL: 44.4 mg/dL (ref >39.00)
LDL CALC: 110 mg/dL — AB (ref 0–99)
NonHDL: 135.68
TRIGLYCERIDES: 127 mg/dL (ref 0.0–149.0)
Total CHOL/HDL Ratio: 4
VLDL: 25.4 mg/dL (ref 0.0–40.0)

## 2017-05-29 LAB — HEPATIC FUNCTION PANEL
ALT: 12 U/L (ref 0–35)
AST: 14 U/L (ref 0–37)
Albumin: 4.1 g/dL (ref 3.5–5.2)
Alkaline Phosphatase: 37 U/L — ABNORMAL LOW (ref 39–117)
BILIRUBIN TOTAL: 0.6 mg/dL (ref 0.2–1.2)
Bilirubin, Direct: 0.2 mg/dL (ref 0.0–0.3)
TOTAL PROTEIN: 7 g/dL (ref 6.0–8.3)

## 2017-05-29 LAB — TSH: TSH: 1.72 u[IU]/mL (ref 0.35–4.50)

## 2017-05-29 LAB — HEMOGLOBIN A1C: Hgb A1c MFr Bld: 8.1 % — ABNORMAL HIGH (ref 4.6–6.5)

## 2017-06-03 ENCOUNTER — Encounter: Payer: Self-pay | Admitting: Internal Medicine

## 2017-06-03 ENCOUNTER — Ambulatory Visit (INDEPENDENT_AMBULATORY_CARE_PROVIDER_SITE_OTHER): Payer: Medicare Other | Admitting: Internal Medicine

## 2017-06-03 DIAGNOSIS — I248 Other forms of acute ischemic heart disease: Secondary | ICD-10-CM

## 2017-06-03 DIAGNOSIS — E119 Type 2 diabetes mellitus without complications: Secondary | ICD-10-CM

## 2017-06-03 DIAGNOSIS — E7800 Pure hypercholesterolemia, unspecified: Secondary | ICD-10-CM | POA: Diagnosis not present

## 2017-06-03 DIAGNOSIS — J189 Pneumonia, unspecified organism: Secondary | ICD-10-CM | POA: Diagnosis not present

## 2017-06-03 DIAGNOSIS — I10 Essential (primary) hypertension: Secondary | ICD-10-CM | POA: Diagnosis not present

## 2017-06-03 DIAGNOSIS — Z0000 Encounter for general adult medical examination without abnormal findings: Secondary | ICD-10-CM | POA: Diagnosis not present

## 2017-06-03 DIAGNOSIS — E1143 Type 2 diabetes mellitus with diabetic autonomic (poly)neuropathy: Secondary | ICD-10-CM

## 2017-06-03 DIAGNOSIS — K3184 Gastroparesis: Secondary | ICD-10-CM | POA: Diagnosis not present

## 2017-06-03 MED ORDER — ROSUVASTATIN CALCIUM 10 MG PO TABS: 10.0000 mg | ORAL_TABLET | Freq: Every day | ORAL | 3 refills | Status: DC

## 2017-06-03 MED ORDER — GLIMEPIRIDE 2 MG PO TABS: 2.0000 mg | ORAL_TABLET | Freq: Two times a day (BID) | ORAL | 1 refills | Status: DC

## 2017-06-03 MED ORDER — METOPROLOL TARTRATE 50 MG PO TABS: ORAL_TABLET | ORAL | 3 refills | Status: DC

## 2017-06-03 MED ORDER — OMEPRAZOLE 20 MG PO CPDR: 20.0000 mg | DELAYED_RELEASE_CAPSULE | Freq: Two times a day (BID) | ORAL | 3 refills | Status: DC

## 2017-06-03 NOTE — Progress Notes (Signed)
Patient ID: Candia Kingsbury, female   DOB: May 19, 1930, 83 y.o.   MRN: [id number]   Subjective:    Patient ID: [name], female    DOB: 06/21/1930, 83 y.o.   MRN: [id number]  HPI  Patient with past history of diabetes, hypertension and hypercholesterolemia.  She comes in today to follow up on these issues as well as for a complete physical exam.  Recently diagnosed with pneumonia.  See previous notes.  Saw Dr Mortimer Fries.  He recommended incentive spirometry.  Recommended f/u CT scan in 3 months.  She states is scheduled for 07/2017.  Breathing is better.  No chest pain.  No acid reflux.  No abdominal pain.  No nausea or vomiting.  Bowels moving.  She did go to the beach recently.  Has noticed some swelling in her feet.  Is better now.  Discussed her lab results.  Discussed elevated sugars.  Sugars are varying.  See attached list.  AM sugars 170-220.  Discussed diet and exercise.  Discussed adjusting her medications.  She is only on amaryl.  Unable to take metformin.  Discussed cholesterol labs and calculated risk.  She is tolerating low dose crestor.  Discussed increasing the dose.     Past Medical History:  Diagnosis Date  . Carotid artery disease ([place])   . Compression fracture    s/p fosamax, prolia  . Diabetes mellitus ([place])   . Femoral neuropathy    left, elevated CRP, ESR, negative FANA and ANCA  . Fibrocystic breast disease   . Hypercholesterolemia   . Hypertension    Past Surgical History:  Procedure Laterality Date  . APPENDECTOMY    . CHOLECYSTECTOMY    . HERNIA REPAIR    . INGUINAL HERNIA REPAIR     left x 1, right x 2  . OVARIAN CYST REMOVAL    . PARTIAL HYSTERECTOMY     Family History  Problem Relation Age of Onset  . Heart disease Father        myocardial infarction  . Heart disease Mother        myocardial infarction  . Diabetes Mother   . Congestive Heart Failure Mother   . Bone cancer Sister   . Diabetes Sister        x2  . Heart disease Sister    CABG  . Rheum arthritis Sister   . Breast cancer Neg Hx   . Colon cancer Neg Hx    Social History   Social History  . Marital status: Widowed    Spouse name: N/A  . Number of children: 3  . Years of education: N/A   Social History Main Topics  . Smoking status: Former Smoker    Quit date: 08/20/1958  . Smokeless tobacco: Never Used  . Alcohol use No  . Drug use: No  . Sexual activity: No   Other Topics Concern  . None   Social History Narrative  . None    Outpatient Encounter Prescriptions as of 06/03/2017  Medication Sig  . albuterol (PROVENTIL HFA;VENTOLIN HFA) 108 (90 Base) MCG/ACT inhaler Inhale 2 puffs into the lungs every 6 (six) hours as needed for wheezing or shortness of breath.  . AMBULATORY NON FORMULARY MEDICATION Medication Name: Incentive Spirometry Use 10-15 times daily.  Marland Kitchen amLODipine (NORVASC) 5 MG tablet TAKE 1 TABLET BY MOUTH DAILY  . aspirin 81 MG tablet Take 81 mg by mouth daily.  Marland Kitchen BAYER CONTOUR TEST test strip CHECK DAILY AND AS NEEDED **E11.9**  .  Calcium Carbonate-Vitamin D (CALCIUM 600+D) 600-400 MG-UNIT per tablet Take 1 tablet by mouth 2 (two) times daily.  Marland Kitchen denosumab (PROLIA) 60 MG/ML SOLN injection Inject 60 mg into the skin every 6 (six) months. Administer in upper arm, thigh, or abdomen  . glimepiride (AMARYL) 2 MG tablet Take 1 tablet (2 mg total) by mouth 2 (two) times daily.  . meclizine (ANTIVERT) 12.5 MG tablet Take 1 tablet (12.5 mg total) by mouth 2 (two) times daily as needed for dizziness.  . metoCLOPramide (REGLAN) 5 MG tablet Take 5 mg by mouth 2 (two) times daily.   . metoprolol tartrate (LOPRESSOR) 50 MG tablet TAKE 1 AND 1/2 TABLET BY MOUTH TWICE A DAY  . omeprazole (PRILOSEC) 20 MG capsule Take 1 capsule (20 mg total) by mouth 2 (two) times daily.  Marland Kitchen Respiratory Therapy Supplies (FLUTTER) DEVI Use 10-15 times daily  . [DISCONTINUED] glimepiride (AMARYL) 2 MG tablet Take 1 tablet (2 mg total) by mouth 2 (two) times daily.  .  [DISCONTINUED] metoprolol (LOPRESSOR) 50 MG tablet TAKE 1 AND 1/2 TABLET BY MOUTH TWICE A DAY  . [DISCONTINUED] omeprazole (PRILOSEC) 20 MG capsule TAKE ONE CAPSULE BY MOUTH TWICE A DAY  . [DISCONTINUED] rosuvastatin (CRESTOR) 5 MG tablet TAKE 1 TABLET (5 MG TOTAL) BY MOUTH DAILY.  . rosuvastatin (CRESTOR) 10 MG tablet Take 1 tablet (10 mg total) by mouth daily.   No facility-administered encounter medications on file as of 06/03/2017.     Review of Systems  Constitutional: Negative for appetite change and unexpected weight change.  HENT: Negative for congestion and sinus pressure.   Eyes: Negative for pain and visual disturbance.  Respiratory: Negative for cough, chest tightness and shortness of breath.   Cardiovascular: Negative for chest pain and palpitations.       Noticed some recent swelling in her feet.  Better.    Gastrointestinal: Negative for abdominal pain, diarrhea, nausea and vomiting.  Genitourinary: Negative for difficulty urinating and dysuria.  Musculoskeletal: Negative for back pain and joint swelling.  Skin: Negative for color change and rash.  Neurological: Negative for dizziness, light-headedness and headaches.  Hematological: Negative for adenopathy. Does not bruise/bleed easily.  Psychiatric/Behavioral: Negative for agitation and dysphoric mood.       Objective:    Physical Exam  Constitutional: She is oriented to person, place, and time. She appears well-developed and well-nourished. No distress.  HENT:  Nose: Nose normal.  Mouth/Throat: Oropharynx is clear and moist.  Eyes: Right eye exhibits no discharge. Left eye exhibits no discharge. No scleral icterus.  Neck: Neck supple. No thyromegaly present.  Cardiovascular: Normal rate and regular rhythm.   Pulmonary/Chest: Breath sounds normal. No accessory muscle usage. No tachypnea. No respiratory distress. She has no decreased breath sounds. She has no wheezes. She has no rhonchi. Right breast exhibits no  inverted nipple, no mass, no nipple discharge and no tenderness (no axillary adenopathy). Left breast exhibits no inverted nipple, no mass, no nipple discharge and no tenderness (no axilarry adenopathy).  Abdominal: Soft. Bowel sounds are normal. There is no tenderness.  Musculoskeletal: She exhibits no tenderness.  No increased edema.    Lymphadenopathy:    She has no cervical adenopathy.  Neurological: She is alert and oriented to person, place, and time.  Skin: Skin is warm. No rash noted. No erythema.  Psychiatric: She has a normal mood and affect. Her behavior is normal.    BP 140/88 (BP Location: Left Arm, Patient Position: Sitting, Cuff Size: Normal)  Pulse 67   Temp 98.6 F (37 C) (Oral)   Resp 14   Ht 5' 6.14" (1.68 m)   Wt 156 lb 3.2 oz (70.9 kg)   SpO2 96%   BMI 25.10 kg/m  Wt Readings from Last 3 Encounters:  06/03/17 156 lb 3.2 oz (70.9 kg)  03/19/17 154 lb (69.9 kg)  03/14/17 155 lb (70.3 kg)     Lab Results  Component Value Date   WBC 7.2 03/14/2017   HGB 12.8 03/14/2017   HCT 37.6 03/14/2017   PLT 223.0 03/14/2017   GLUCOSE 214 (H) 05/29/2017   CHOL 180 05/29/2017   TRIG 127.0 05/29/2017   HDL 44.40 05/29/2017   LDLCALC 110 (H) 05/29/2017   ALT 12 05/29/2017   AST 14 05/29/2017   NA 137 05/29/2017   K 3.9 05/29/2017   CL 99 05/29/2017   CREATININE 1.00 05/29/2017   BUN 17 05/29/2017   CO2 29 05/29/2017   TSH 1.72 05/29/2017   HGBA1C 8.1 (H) 05/29/2017   MICROALBUR 4.1 (H) 05/29/2017    Ct Angio Ao+bifem W & Or Wo Contrast  Result Date: 01/18/2017 CLINICAL DATA:  Incidental finding of atherosclerotic plaque involving the left femoral artery on DVT ultrasound. Evaluate for PAD. EXAM: CT ANGIOGRAPHY OF ABDOMINAL AORTA WITH ILIOFEMORAL RUNOFF TECHNIQUE: Multidetector CT imaging of the abdomen, pelvis and lower extremities was performed using the standard protocol during bolus administration of intravenous contrast. Multiplanar CT image reconstructions  and MIPs were obtained to evaluate the vascular anatomy. CONTRAST:  125 cc Isovue 370 COMPARISON:  CT abdomen and pelvis - 03/27/2011 ; left lower extremity venous Doppler ultrasound - earlier same day ; chest CT -01/15/2017 FINDINGS: VASCULAR Aorta: Moderate amount of eccentric mixed calcified and noncalcified atherosclerotic plaque within a tortuous but normal caliber abdominal aorta, not resulting in hemodynamically significant stenosis. No abdominal aortic dissection or periaortic stranding. Celiac: There is a minimal amount of eccentric mixed calcified and noncalcified atherosclerotic plaque involving the cranial aspect of the origin the celiac artery, not resulting in hemodynamically significant stenosis. Conventional branching pattern. SMA: There is eccentric mixed calcified and noncalcified atherosclerotic plaque involving the origin and proximal aspect of the SMA which approaches approximately 50% luminal narrowing (representative axial image 42, series 4; coronal image 98, series 6). Conventional branching pattern. The distal tributaries of the SMA are widely patent without discrete intraluminal filling defect suggest distal embolism. Renals: Solitary bilaterally there is a minimal amount of eccentric mixed calcified and noncalcified atherosclerotic plaque involving the origin of the bilateral renal arteries, not definitely resulting in a hemodynamically significant stenosis. IMA: Remains patent. RIGHT Lower Extremity Inflow: The right common, external and internal iliac arteries are mildly diseased and tortuous though patent without hemodynamically significant stenosis and of normal caliber. Outflow: There is a moderate to large amount of eccentric mixed calcified and noncalcified atherosclerotic plaque within the right common femoral artery (representative images 127 and 131, series 4) which is approaches approximately 50% luminal narrowing. The right superficial and deep femoral arteries are widely  patent without hemodynamically significant stenosis. Both the right above and below-knee popliteal arteries are widely patent without hemodynamically significant stenosis. Runoff: Three-vessel runoff to the right lower leg and foot. The right-sided dorsalis pedis artery is patent to the level of the forefoot. LEFT Lower Extremity Inflow: The left common, external and internal iliac arteries are mildly diseased and tortuous though patent without hemodynamically significant stenosis and of normal caliber. Outflow: There is a large amount of eccentric mixed calcified  and noncalcified atherosclerotic plaque within the left common femoral artery (representative images 132 and 136, series 4), compatible with the findings seen on preceding DVT ultrasound and likely resulting in at least 50% luminal narrowing. The left superficial and deep femoral arteries are widely patent. Both the left above and below-knee popliteal arteries are widely patent. Runoff: Three-vessel runoff to the left lower leg and foot. The left-sided dorsalis pedis artery is patent to the level of the forefoot. Veins: The IVC and pelvic venous system appears widely patent on this arterial phase examination. Review of the MIP images confirms the above findings. NON-VASCULAR Evaluation of abdominal organs is limited to the arterial phase of enhancement. Lower chest: Limited visualization of lower thorax demonstrates consolidative opacities within in the posteromedial and lateral basilar aspects of the right lower lobe with associated air bronchograms, progressed compared to the 01/15/2017 examination. No pleural effusion. Cardiomegaly. Coronary artery calcifications. No pericardial effusion. Hepatobiliary: Normal hepatic contour. No discrete hyperenhancing hepatic lesions. Post cholecystectomy. No intra extrahepatic bili duct dilatation. No ascites. Pancreas: Normal appearance of the pancreas Spleen: Normal appearance of the spleen. Adrenals/Urinary Tract:  There is symmetric enhancement of the bilateral kidneys. No definite renal stones this postcontrast examination. No discrete renal lesions. No urine obstruction. There is a minimal amount of grossly symmetric bilateral perinephric stranding, likely age and body habitus related. No urine obstruction. Normal appearance the bilateral adrenal glands. Normal appearance of the urinary bladder given degree distention. Stomach/Bowel: Rather extensive colonic diverticulosis without evidence of diverticulitis. Normal appearance of the terminal ileum. The appendix is not visualized, however there is no pericecal inflammatory change. No pneumoperitoneum, pneumatosis or portal venous gas. Lymphatic: No bulky retroperitoneal, mesenteric, pelvic or inguinal lymphadenopathy. Reproductive: Post hysterectomy. No discrete adnexal lesion. No free fluid the pelvic cul-de-sac. Other: Post right lower ventral wall hernia repair without evidence of recurrence. Musculoskeletal: No acute or aggressive osseous abnormalities. Old moderate (approximately 40%) compression deformity involving the superior endplate of the L1 vertebral body, similar to the 03/2011 exam. Moderate severe multilevel lumbar spine DDD, worse at L4-L5 and L5-S1 with disc space height loss, endplate irregularity and sclerosis. Bone islands are incidentally noted within the left acetabulum and right femoral head. IMPRESSION: VASCULAR 1. Moderate amount of mixed calcified and noncalcified atherosclerotic plaque within a normal caliber abdominal aorta. Aortic Atherosclerosis (ICD10-I70.0). 2. Suspected hemodynamically significant stenoses involving the bilateral common femoral arteries, left greater than right. 3. Three-vessel runoff to the bilateral lower legs and feet with dorsalis pedis artery is patent to the level of the forefeet bilaterally. 4. Suspected hemodynamically significant narrowing involving the proximal SMA, however both the celiac and IMA are widely patent  without hemodynamically significant stenosis. 5. Coronary artery calcifications. NON-VASCULAR 1. Worsening right basilar consolidative opacities with associated air bronchograms, progressed compared to the 01/15/2017 examination and potentially indicative of worsening infection and/or aspiration. Clinical correlation is advised. 2. Extensive colonic diverticulosis without evidence of diverticulitis. Electronically Signed   By: Sandi Mariscal M.D.   On: 01/18/2017 16:59   US Venous Img Lower Unilateral Left  Result Date: 01/18/2017 CLINICAL DATA:  Left lower extremity swelling. EXAM: Left LOWER EXTREMITY VENOUS DOPPLER ULTRASOUND TECHNIQUE: Gray-scale sonography with graded compression, as well as color Doppler and duplex ultrasound were performed to evaluate the lower extremity deep venous systems from the level of the common femoral vein and including the common femoral, femoral, profunda femoral, popliteal and calf veins including the posterior tibial, peroneal and gastrocnemius veins when visible. The superficial great saphenous vein  was also interrogated. Spectral Doppler was utilized to evaluate flow at rest and with distal augmentation maneuvers in the common femoral, femoral and popliteal veins. COMPARISON:  None. FINDINGS: Contralateral Common Femoral Vein: Respiratory phasicity is normal and symmetric with the symptomatic side. No evidence of thrombus. Normal compressibility. Common Femoral Vein: No evidence of thrombus. Normal compressibility, respiratory phasicity and response to augmentation. Saphenofemoral Junction: No evidence of thrombus. Normal compressibility and flow on color Doppler imaging. Profunda Femoral Vein: No evidence of thrombus. Normal compressibility and flow on color Doppler imaging. Femoral Vein: No evidence of thrombus. Normal compressibility, respiratory phasicity and response to augmentation. Popliteal Vein: No evidence of thrombus. Normal compressibility, respiratory phasicity and  response to augmentation. Calf Veins: No evidence of thrombus. Normal compressibility and flow on color Doppler imaging. Venous Reflux:  None. Other Findings: 3 cm Baker's cyst is noted in left popliteal fossa. Incidental note is made of large irregular and partially mobile plaque in the left common femoral artery resulting in moderate to severe stenosis. IMPRESSION: No evidence of DVT within the left lower extremity. 3 cm Baker's cyst seen in left popliteal fossa. Large irregular and partially mobile plaque is noted in the left common femoral artery resulting in moderate to severe stenosis. This could represent an embolic source into more distal arteries and vascular surgery consultation is recommended. Electronically Signed   By: Marijo Conception, M.D.   On: 01/18/2017 10:11   Dg Chest Port 1 View  Result Date: 01/18/2017 CLINICAL DATA:  Cough.  Recent diagnosis of pneumonia. EXAM: PORTABLE CHEST 1 VIEW COMPARISON:  01/15/2017 FINDINGS: Mild cardiomegaly persists. Aortic atherosclerosis again demonstrated. Patchy bilateral bronchopneumonia right worse than left shows improvement. No worsening or new findings. IMPRESSION: Radiographic improvement in patchy bilateral bronchopneumonia right more than left. Electronically Signed   By: Nelson Chimes M.D.   On: 01/18/2017 09:34       Assessment & Plan:   Problem List Items Addressed This Visit    Demand ischemia [facility])    Had slightly elevated troponin in the hospital.  Felt to be demand ischemia.  No further cardiac w/up felt warranted.  Continue risk factor modification.  Currently without any acute issues.        Relevant Medications   metoprolol tartrate (LOPRESSOR) 50 MG tablet   rosuvastatin (CRESTOR) 10 MG tablet   Diabetes mellitus type 2, uncomplicated ([facility])    Was previously taken off metformin due to perceived problems/intolerance.  Only on amaryl.  Blood sugars as outlined.  Discussed low carb diet and exercise.  a1c has gradually increased.   Will d/w pharmacist regarding cost effective medication options.        Relevant Medications   glimepiride (AMARYL) 2 MG tablet   rosuvastatin (CRESTOR) 10 MG tablet   Other Relevant Orders   Hemoglobin A1c   Gastroparesis due to DM ([place])    Followed by GI.  Remains on reglan.        Relevant Medications   glimepiride (AMARYL) 2 MG tablet   rosuvastatin (CRESTOR) 10 MG tablet   Health care maintenance    Physical today 06/03/17.  Mammogram 05/23/16 - Birads I.  Scheduled for f/u mammogram.  Colonoscopy 02/2005.       Hypercholesterolemia    On crestor.  Tolerating.  Discussed calculated cholesterol risk.  Agreed to increase crestor to 68m q day.  Follow lipid panel and liver function tests.        Relevant Medications   metoprolol tartrate (LOPRESSOR) 50  MG tablet   rosuvastatin (CRESTOR) 10 MG tablet   Other Relevant Orders   Hepatic function panel   Lipid panel   Hypertension    Blood pressure as outlined.  Will continue same medication regimen.  Follow pressures.  Follow metabolic panel.        Relevant Medications   metoprolol tartrate (LOPRESSOR) 50 MG tablet   rosuvastatin (CRESTOR) 10 MG tablet   Other Relevant Orders   Basic metabolic panel   Pneumonia    Was recently admitted with sepsis and pneumonia.  Treated.  Recently evaluated by Dr Mortimer Fries.  Note reviewed.  Recommended incentive spirometer.  Recommended repeat CT chest in 3 months.  Pt states is scheduled for 07/2017.  Breathing better.            Einar Pheasant, MD

## 2017-06-03 NOTE — Patient Instructions (Signed)
Increase crestor to 10mg per day 

## 2017-06-05 ENCOUNTER — Encounter: Payer: Self-pay | Admitting: Internal Medicine

## 2017-06-06 ENCOUNTER — Encounter: Payer: Self-pay | Admitting: Internal Medicine

## 2017-06-06 DIAGNOSIS — Z23 Encounter for immunization: Secondary | ICD-10-CM | POA: Diagnosis not present

## 2017-06-06 NOTE — Assessment & Plan Note (Addendum)
Physical today 06/03/17.  Mammogram 05/23/16 - Birads I.  Scheduled for f/u mammogram.  Colonoscopy 02/2005.

## 2017-06-06 NOTE — Assessment & Plan Note (Signed)
Was previously taken off metformin due to perceived problems/intolerance.  Only on amaryl.  Blood sugars as outlined.  Discussed low carb diet and exercise.  a1c has gradually increased.  Will d/w pharmacist regarding cost effective medication options.

## 2017-06-06 NOTE — Assessment & Plan Note (Signed)
Was recently admitted with sepsis and pneumonia.  Treated.  Recently evaluated by Dr Mortimer Fries.  Note reviewed.  Recommended incentive spirometer.  Recommended repeat CT chest in 3 months.  Pt states is scheduled for 07/2017.  Breathing better.

## 2017-06-06 NOTE — Assessment & Plan Note (Signed)
Blood pressure as outlined.  Will continue same medication regimen.  Follow pressures.  Follow metabolic panel.   

## 2017-06-06 NOTE — Assessment & Plan Note (Signed)
Followed by GI.  Remains on reglan.

## 2017-06-06 NOTE — Assessment & Plan Note (Signed)
On crestor.  Tolerating.  Discussed calculated cholesterol risk.  Agreed to increase crestor to 10mg  q day.  Follow lipid panel and liver function tests.

## 2017-06-06 NOTE — Assessment & Plan Note (Signed)
Had slightly elevated troponin in the hospital.  Felt to be demand ischemia.  No further cardiac w/up felt warranted.  Continue risk factor modification.  Currently without any acute issues.

## 2017-06-10 DIAGNOSIS — E119 Type 2 diabetes mellitus without complications: Secondary | ICD-10-CM | POA: Diagnosis not present

## 2017-06-10 DIAGNOSIS — H353131 Nonexudative age-related macular degeneration, bilateral, early dry stage: Secondary | ICD-10-CM | POA: Diagnosis not present

## 2017-06-10 LAB — HM DIABETES EYE EXAM

## 2017-06-12 ENCOUNTER — Ambulatory Visit
Admission: RE | Admit: 2017-06-12 | Discharge: 2017-06-12 | Disposition: A | Discharge status: Home / Self Care | Payer: Medicare Other | Source: Ambulatory Visit | Attending: Internal Medicine | Admitting: Internal Medicine

## 2017-06-12 DIAGNOSIS — Z1231 Encounter for screening mammogram for malignant neoplasm of breast: Secondary | ICD-10-CM | POA: Diagnosis not present

## 2017-06-17 ENCOUNTER — Ambulatory Visit
Admission: RE | Admit: 2017-06-17 | Discharge: 2017-06-17 | Disposition: A | Discharge status: Home / Self Care | Payer: Medicare Other | Source: Ambulatory Visit | Attending: Internal Medicine | Admitting: Internal Medicine

## 2017-06-17 DIAGNOSIS — I2510 Atherosclerotic heart disease of native coronary artery without angina pectoris: Secondary | ICD-10-CM | POA: Insufficient documentation

## 2017-06-17 DIAGNOSIS — R911 Solitary pulmonary nodule: Secondary | ICD-10-CM | POA: Diagnosis not present

## 2017-06-17 DIAGNOSIS — I289 Disease of pulmonary vessels, unspecified: Secondary | ICD-10-CM | POA: Diagnosis not present

## 2017-06-17 DIAGNOSIS — J181 Lobar pneumonia, unspecified organism: Secondary | ICD-10-CM | POA: Diagnosis present

## 2017-06-17 DIAGNOSIS — Z8701 Personal history of pneumonia (recurrent): Secondary | ICD-10-CM | POA: Insufficient documentation

## 2017-06-17 DIAGNOSIS — I7789 Other specified disorders of arteries and arterioles: Secondary | ICD-10-CM | POA: Insufficient documentation

## 2017-06-17 DIAGNOSIS — J189 Pneumonia, unspecified organism: Secondary | ICD-10-CM | POA: Diagnosis not present

## 2017-06-17 DIAGNOSIS — I700 Atherosclerosis of aorta: Secondary | ICD-10-CM | POA: Diagnosis not present

## 2017-06-17 DIAGNOSIS — Z09 Encounter for follow-up examination after completed treatment for conditions other than malignant neoplasm: Secondary | ICD-10-CM | POA: Diagnosis not present

## 2017-06-17 NOTE — Telephone Encounter (Signed)
Pt declined AWV. States she goes to the doctors every 3-4 months

## 2017-06-20 ENCOUNTER — Ambulatory Visit (INDEPENDENT_AMBULATORY_CARE_PROVIDER_SITE_OTHER): Payer: Medicare Other | Admitting: Pulmonary Disease

## 2017-06-20 ENCOUNTER — Encounter: Payer: Self-pay | Admitting: Pulmonary Disease

## 2017-06-20 VITALS — BP 136/76 | HR 64 | Ht 66.0 in | Wt 155.0 lb

## 2017-06-20 DIAGNOSIS — J189 Pneumonia, unspecified organism: Secondary | ICD-10-CM | POA: Diagnosis not present

## 2017-06-20 DIAGNOSIS — I248 Other forms of acute ischemic heart disease: Secondary | ICD-10-CM

## 2017-06-20 NOTE — Patient Instructions (Addendum)
Your CAT scan reveals complete resolution of the pneumonia seen back in May of this year.  No further evaluation or follow-up is needed.  Follow-up as needed

## 2017-06-21 DIAGNOSIS — M810 Age-related osteoporosis without current pathological fracture: Secondary | ICD-10-CM | POA: Diagnosis not present

## 2017-06-25 ENCOUNTER — Encounter: Payer: Self-pay | Admitting: Pulmonary Disease

## 2017-06-25 NOTE — Progress Notes (Signed)
ACUTE PULMONARY OFFICE VISIT  PULMONARY OFFICE FOLLOW UP  DATA: 01/15/17 CT chest: Right greater than left reticulonodular infiltrate with right hilar and mediastinal adenopathy 06/17/17 CT chest: Resolution of pneumonia and thoracic adenopathy. No acute process in the chest  SUBJ: Patient previously seen by Dr. Mortimer Fries after a respiratory tract infection with abnormal CT chest in May 2018.  Dr. Mortimer Fries ordered a repeat CT scan of the chest in this follow-up.  At this point, she is all the way back to her baseline.  He has no chest or respiratory complaints.  Specifically, she denies fever, chest pain, cough, sputum production, hemoptysis, lower extremity edema, calf tenderness.  OBJ: Vitals:   06/20/17 1130 06/20/17 1132  BP:  136/76  Pulse:  64  SpO2:  98%  Weight: 70.3 kg (155 lb)   Height: 5\' 6"  (1.676 m)      Gen: WDWN in NAD HEENT: NCAT, sclerae white, oropharynx normal Neck: NO LAN, no JVD noted Lungs: full BS, normal percussion note throughout, no adventitious sounds Cardiovascular: Reg rate, normal rhythm, no M noted Abdomen: Soft, NT, +BS Ext: no C/C/E Neuro: PERRL, EOMI, motor/sensory grossly intact Skin: No lesions noted   CT chest as reported above  IMPRESSION: Resolution of previous pulmonary infiltrates which probably represented an acute atypical pulmonary infection  No ongoing pulmonary or respiratory problems  PLAN:   No further follow-up is warranted I do not believe that she needs any further scans in the future Follow-up as needed   I have reviewed this patient's medical problems, current medications and therapies and prior pulmonary office notes in evaluation and formulation of the above assessment and plan  Merton Border, MD PCCM service Mobile [phone] Pager [phone] 06/25/2017 3:43 PM

## 2017-07-03 DIAGNOSIS — M810 Age-related osteoporosis without current pathological fracture: Secondary | ICD-10-CM | POA: Diagnosis not present

## 2017-07-29 ENCOUNTER — Encounter (INDEPENDENT_AMBULATORY_CARE_PROVIDER_SITE_OTHER): Payer: Medicare Other

## 2017-07-29 ENCOUNTER — Ambulatory Visit (INDEPENDENT_AMBULATORY_CARE_PROVIDER_SITE_OTHER): Payer: Medicare Other | Admitting: Vascular Surgery

## 2017-08-28 ENCOUNTER — Ambulatory Visit (INDEPENDENT_AMBULATORY_CARE_PROVIDER_SITE_OTHER): Payer: Medicare Other | Admitting: Internal Medicine

## 2017-08-28 DIAGNOSIS — E119 Type 2 diabetes mellitus without complications: Secondary | ICD-10-CM

## 2017-08-28 DIAGNOSIS — E7800 Pure hypercholesterolemia, unspecified: Secondary | ICD-10-CM

## 2017-08-28 DIAGNOSIS — I10 Essential (primary) hypertension: Secondary | ICD-10-CM

## 2017-08-28 LAB — HEPATIC FUNCTION PANEL
ALT: 9 U/L (ref 0–35)
AST: 13 U/L (ref 0–37)
Albumin: 4.1 g/dL (ref 3.5–5.2)
Alkaline Phosphatase: 48 U/L (ref 39–117)
BILIRUBIN DIRECT: 0.1 mg/dL (ref 0.0–0.3)
BILIRUBIN TOTAL: 0.6 mg/dL (ref 0.2–1.2)
TOTAL PROTEIN: 7.2 g/dL (ref 6.0–8.3)

## 2017-08-28 LAB — LIPID PANEL
CHOL/HDL RATIO: 4
CHOLESTEROL: 165 mg/dL (ref 0–200)
HDL: 44.1 mg/dL (ref >39.00)
LDL CALC: 97 mg/dL (ref 0–99)
NonHDL: 120.82
Triglycerides: 120 mg/dL (ref 0.0–149.0)
VLDL: 24 mg/dL (ref 0.0–40.0)

## 2017-08-28 LAB — BASIC METABOLIC PANEL
BUN: 13 mg/dL (ref 6–23)
CHLORIDE: 100 meq/L (ref 96–112)
CO2: 27 meq/L (ref 19–32)
Calcium: 8.8 mg/dL (ref 8.4–10.5)
Creatinine, Ser: 0.9 mg/dL (ref 0.40–1.20)
GFR: 63.12 mL/min (ref >60.00)
Glucose, Bld: 254 mg/dL — ABNORMAL HIGH (ref 70–99)
POTASSIUM: 3.9 meq/L (ref 3.5–5.1)
Sodium: 135 mEq/L (ref 135–145)

## 2017-08-28 LAB — HEMOGLOBIN A1C: HEMOGLOBIN A1C: 8.2 % — AB (ref 4.6–6.5)

## 2017-08-28 NOTE — Progress Notes (Signed)
Pt seen for lab.  Labs ordered.

## 2017-09-03 ENCOUNTER — Ambulatory Visit (INDEPENDENT_AMBULATORY_CARE_PROVIDER_SITE_OTHER): Payer: Medicare Other | Admitting: Internal Medicine

## 2017-09-03 ENCOUNTER — Encounter: Payer: Self-pay | Admitting: Internal Medicine

## 2017-09-03 ENCOUNTER — Telehealth: Payer: Self-pay | Admitting: Internal Medicine

## 2017-09-03 ENCOUNTER — Other Ambulatory Visit: Payer: Self-pay

## 2017-09-03 DIAGNOSIS — I2510 Atherosclerotic heart disease of native coronary artery without angina pectoris: Secondary | ICD-10-CM

## 2017-09-03 DIAGNOSIS — R05 Cough: Secondary | ICD-10-CM

## 2017-09-03 DIAGNOSIS — E119 Type 2 diabetes mellitus without complications: Secondary | ICD-10-CM

## 2017-09-03 DIAGNOSIS — I10 Essential (primary) hypertension: Secondary | ICD-10-CM

## 2017-09-03 DIAGNOSIS — G6289 Other specified polyneuropathies: Secondary | ICD-10-CM

## 2017-09-03 DIAGNOSIS — K3184 Gastroparesis: Secondary | ICD-10-CM

## 2017-09-03 DIAGNOSIS — E1143 Type 2 diabetes mellitus with diabetic autonomic (poly)neuropathy: Secondary | ICD-10-CM

## 2017-09-03 DIAGNOSIS — R059 Cough, unspecified: Secondary | ICD-10-CM

## 2017-09-03 DIAGNOSIS — E7800 Pure hypercholesterolemia, unspecified: Secondary | ICD-10-CM

## 2017-09-03 MED ORDER — GLUCOSE BLOOD VI STRP: ORAL_STRIP | 6 refills | Status: DC

## 2017-09-03 NOTE — Patient Instructions (Signed)
mucinex - one tablet per day as needed for congestion.

## 2017-09-03 NOTE — Telephone Encounter (Signed)
Medication has been refilled.

## 2017-09-03 NOTE — Telephone Encounter (Signed)
Copied from [place] [phone]. Topic: Quick Communication - Rx Refill/Question >> Sep 03, 2017  1:22 PM Synthia Innocent wrote: Medication: Test strips, does not know type of machine she has, was seen this morning, was to already be sent.    Has the patient contacted their pharmacy? Yes.     (Agent: If no, request that the patient contact the pharmacy for the refill.)   Preferred Pharmacy (with phone number or street name): [facility] [address] [place]   Agent: Please be advised that RX refills may take up to 3 business days. We ask that you follow-up with your pharmacy.

## 2017-09-03 NOTE — Progress Notes (Signed)
Patient ID: Debra Kirk, female   DOB: [birth year], 84 y.o.   MRN: [id number]   Subjective:    Patient ID: Debra Kirk, female    DOB: [birth year], 84 y.o.   MRN: [id number]  HPI  Patient here for a scheduled follow up.  She reports she is doing relatively well.  Tries to stay active.  Has had some cough and congestion recently.  Cough is non productive.  Taking tylenol.  Is better.  No sob.  No chest pain.  No acid reflux.  No abdominal pain.  Bowels moving.  Discussed recent lab results.  a1c 8.2. Discussed diet and exercise.     Past Medical History:  Diagnosis Date  . Carotid artery disease ([place])   . Compression fracture    s/p fosamax, prolia  . Diabetes mellitus ([place])   . Femoral neuropathy    left, elevated CRP, ESR, negative FANA and ANCA  . Fibrocystic breast disease   . Hypercholesterolemia   . Hypertension    Past Surgical History:  Procedure Laterality Date  . APPENDECTOMY    . CHOLECYSTECTOMY    . HERNIA REPAIR    . INGUINAL HERNIA REPAIR     left x 1, right x 2  . OVARIAN CYST REMOVAL    . PARTIAL HYSTERECTOMY     Family History  Problem Relation Age of Onset  . Heart disease Father        myocardial infarction  . Heart disease Mother        myocardial infarction  . Diabetes Mother   . Congestive Heart Failure Mother   . Bone cancer Sister   . Diabetes Sister        x2  . Ovarian cancer Sister 59  . Heart disease Sister        CABG  . Rheum arthritis Sister   . Breast cancer Neg Hx   . Colon cancer Neg Hx    Social History   Socioeconomic History  . Marital status: Widowed    Spouse name: None  . Number of children: 3  . Years of education: None  . Highest education level: None  Social Needs  . Financial resource strain: None  . Food insecurity - worry: None  . Food insecurity - inability: None  . Transportation needs - medical: None  . Transportation needs - non-medical: None  Occupational History  . None  Tobacco Use  .  Smoking status: Former Smoker    Last attempt to quit: 08/20/1958    Years since quitting: 59.0  . Smokeless tobacco: Never Used  Substance and Sexual Activity  . Alcohol use: No    Alcohol/week: 0.0 oz  . Drug use: No  . Sexual activity: No  Other Topics Concern  . None  Social History Narrative  . None    Outpatient Encounter Medications as of 09/03/2017  Medication Sig  . albuterol (PROVENTIL HFA;VENTOLIN HFA) 108 (90 Base) MCG/ACT inhaler Inhale 2 puffs into the lungs every 6 (six) hours as needed for wheezing or shortness of breath.  . AMBULATORY NON FORMULARY MEDICATION Medication Name: Incentive Spirometry Use 10-15 times daily.  Marland Kitchen amLODipine (NORVASC) 5 MG tablet TAKE 1 TABLET BY MOUTH DAILY  . aspirin 81 MG tablet Take 81 mg by mouth daily.  . Calcium Carbonate-Vitamin D (CALCIUM 600+D) 600-400 MG-UNIT per tablet Take 1 tablet by mouth 2 (two) times daily.  Marland Kitchen denosumab (PROLIA) 60 MG/ML SOLN injection Inject 60 mg into the skin every 6 (  six) months. Administer in upper arm, thigh, or abdomen  . glimepiride (AMARYL) 2 MG tablet Take 1 tablet (2 mg total) by mouth 2 (two) times daily.  . meclizine (ANTIVERT) 12.5 MG tablet Take 1 tablet (12.5 mg total) by mouth 2 (two) times daily as needed for dizziness.  . metoCLOPramide (REGLAN) 5 MG tablet Take 5 mg by mouth 2 (two) times daily.   . metoprolol tartrate (LOPRESSOR) 50 MG tablet TAKE 1 AND 1/2 TABLET BY MOUTH TWICE A DAY  . omeprazole (PRILOSEC) 20 MG capsule Take 1 capsule (20 mg total) by mouth 2 (two) times daily.  Marland Kitchen Respiratory Therapy Supplies (FLUTTER) DEVI Use 10-15 times daily  . rosuvastatin (CRESTOR) 10 MG tablet Take 1 tablet (10 mg total) by mouth daily.  . [DISCONTINUED] BAYER CONTOUR TEST test strip CHECK DAILY AND AS NEEDED **E11.9**   No facility-administered encounter medications on file as of 09/03/2017.     Review of Systems  Constitutional: Negative for appetite change and unexpected weight change.    HENT: Positive for congestion. Negative for sinus pressure.   Respiratory: Negative for chest tightness and shortness of breath.        Some recent cough.  Is better.    Cardiovascular: Negative for chest pain, palpitations and leg swelling.  Gastrointestinal: Negative for abdominal pain, diarrhea, nausea and vomiting.  Genitourinary: Negative for difficulty urinating and dysuria.  Musculoskeletal: Negative for joint swelling and myalgias.  Skin: Negative for color change and rash.  Neurological: Negative for dizziness, light-headedness and headaches.  Psychiatric/Behavioral: Negative for agitation and dysphoric mood.       Objective:    Physical Exam  Constitutional: She appears well-developed and well-nourished. No distress.  HENT:  Nose: Nose normal.  Mouth/Throat: Oropharynx is clear and moist.  Neck: Neck supple. No thyromegaly present.  Cardiovascular: Normal rate and regular rhythm.  Pulmonary/Chest: Breath sounds normal. No respiratory distress. She has no wheezes.  Abdominal: Soft. Bowel sounds are normal. There is no tenderness.  Musculoskeletal: She exhibits no edema or tenderness.  Lymphadenopathy:    She has no cervical adenopathy.  Skin: No rash noted. No erythema.  Psychiatric: She has a normal mood and affect. Her behavior is normal.    BP 130/72   Pulse (!) 59   Temp 97.9 F (36.6 C) (Oral)   Resp 16   Wt 155 lb 9.6 oz (70.6 kg)   SpO2 96%   BMI 25.11 kg/m  Wt Readings from Last 3 Encounters:  09/03/17 155 lb 9.6 oz (70.6 kg)  06/20/17 155 lb (70.3 kg)  06/03/17 156 lb 3.2 oz (70.9 kg)     Lab Results  Component Value Date   WBC 7.2 03/14/2017   HGB 12.8 03/14/2017   HCT 37.6 03/14/2017   PLT 223.0 03/14/2017   GLUCOSE 254 (H) 08/28/2017   CHOL 165 08/28/2017   TRIG 120.0 08/28/2017   HDL 44.10 08/28/2017   LDLCALC 97 08/28/2017   ALT 9 08/28/2017   AST 13 08/28/2017   NA 135 08/28/2017   K 3.9 08/28/2017   CL 100 08/28/2017    CREATININE 0.90 08/28/2017   BUN 13 08/28/2017   CO2 27 08/28/2017   TSH 1.72 05/29/2017   HGBA1C 8.2 (H) 08/28/2017   MICROALBUR 4.1 (H) 05/29/2017    Ct Chest Wo Contrast  Result Date: 06/17/2017 CLINICAL DATA:  Former smoker. Follow-up of pneumonia. Shortness of breath. EXAM: CT CHEST WITHOUT CONTRAST TECHNIQUE: Multidetector CT imaging of the chest was performed following  the standard protocol without IV contrast. COMPARISON:  02/11/2017 chest radiograph.  CT 01/15/2017. FINDINGS: Cardiovascular: Aortic and branch vessel atherosclerosis. Tortuous thoracic aorta. Mild cardiomegaly, without pericardial effusion. Multivessel coronary artery atherosclerosis. Pulmonary artery enlargement, 3.2 cm outflow tract. Mediastinum/Nodes: No supraclavicular adenopathy. Resolved thoracic adenopathy. Index right paratracheal node measures 8 mm on image 49/series 2 versus 1.2 cm on the prior. Resolved hilar adenopathy, suboptimally evaluated on this noncontrast exam. Lungs/Pleura: No pleural fluid. 3 mm right lower lobe pulmonary nodule an image 84/series 3. Not readily apparent on priors. Resolved bilateral pneumonia.  Mild centrilobular emphysema. Upper Abdomen: Normal imaged portions of the liver, spleen, stomach, adrenal glands, kidneys. Musculoskeletal: Moderate L1 compression deformity with ventral canal encroachment, similar. IMPRESSION: 1. Resolution of pneumonia and thoracic adenopathy. 2.  No acute process in the chest. 3. Right lower lobe 3 mm pulmonary nodule is not readily apparent on remote CTs. No follow-up needed if patient is low-risk. Non-contrast chest CT can be considered in 12 months if patient is high-risk. This recommendation follows the consensus statement: Guidelines for Management of Incidental Pulmonary Nodules Detected on CT Images: From the Fleischner Society 2017; Radiology 2017; 284:228-243. 4. Coronary artery atherosclerosis. Aortic Atherosclerosis (ICD10-I70.0). 5. Pulmonary artery  enlargement suggests pulmonary arterial hypertension. Electronically Signed   By: Abigail Miyamoto M.D.   On: 06/17/2017 10:39       Assessment & Plan:   Problem List Items Addressed This Visit    CAD (coronary artery disease)    Continue risk factor modification.        Cough    Minimal recently.  Better now.  Add mucinex as directed.  Follow.  Notify me if any worsening or change.  Seeing pulmonary.   Had abnormal CT scan as outlined.  Saw pulmonary 06/2017.  Recommended no further scan.  See note.       Diabetes mellitus type 2, uncomplicated ([place])    83 year old.  a1c 8.2.  Trending up.  Was previous taken off metformin secondary to intolerance.  Only on amaryl.  Discussed diet and exercise.  Send trending up, consider additional medication.         Relevant Orders   Hemoglobin E1D   Basic metabolic panel   Gastroparesis due to DM ([place])    Followed by GI.  On reglan. Doing well.       Hypercholesterolemia    On crestor.  Low cholesterol diet and exercise.  Follow lipid panel and liver function tests.  LDL 97.        Relevant Orders   Hepatic function panel   Lipid panel   Hypertension    Blood pressure under good control.  Continue same medication regimen.  Follow pressures.  Follow metabolic panel.        Peripheral neuropathy    Stable.            Einar Pheasant, MD

## 2017-09-03 NOTE — Telephone Encounter (Signed)
See request for refill on diabetic test strips; was unsure if the pt. discussed a different machine with Dr. Nicki Reaper; not enough information to handle this request

## 2017-09-06 ENCOUNTER — Encounter: Payer: Self-pay | Admitting: Internal Medicine

## 2017-09-06 NOTE — Assessment & Plan Note (Signed)
Continue risk factor modification 

## 2017-09-06 NOTE — Assessment & Plan Note (Signed)
84 year old.  a1c 8.2.  Trending up.  Was previous taken off metformin secondary to intolerance.  Only on amaryl.  Discussed diet and exercise.  Send trending up, consider additional medication.

## 2017-09-06 NOTE — Assessment & Plan Note (Signed)
Blood pressure under good control.  Continue same medication regimen.  Follow pressures.  Follow metabolic panel.   

## 2017-09-06 NOTE — Assessment & Plan Note (Addendum)
Minimal recently.  Better now.  Add mucinex as directed.  Follow.  Notify me if any worsening or change.  Seeing pulmonary.   Had abnormal CT scan as outlined.  Saw pulmonary 06/2017.  Recommended no further scan.  See note.

## 2017-09-06 NOTE — Assessment & Plan Note (Signed)
Followed by GI.  On reglan. Doing well.

## 2017-09-06 NOTE — Assessment & Plan Note (Signed)
On crestor.  Low cholesterol diet and exercise.  Follow lipid panel and liver function tests.  LDL 97.

## 2017-09-06 NOTE — Assessment & Plan Note (Signed)
Stable

## 2017-09-08 DIAGNOSIS — J189 Pneumonia, unspecified organism: Secondary | ICD-10-CM | POA: Diagnosis not present

## 2017-09-09 ENCOUNTER — Ambulatory Visit (INDEPENDENT_AMBULATORY_CARE_PROVIDER_SITE_OTHER): Payer: Medicare Other | Admitting: Vascular Surgery

## 2017-09-09 ENCOUNTER — Encounter (INDEPENDENT_AMBULATORY_CARE_PROVIDER_SITE_OTHER): Payer: Medicare Other

## 2017-09-16 ENCOUNTER — Telehealth: Payer: Self-pay | Admitting: Internal Medicine

## 2017-09-16 NOTE — Telephone Encounter (Signed)
Copied from [place] [id number]. Topic: General - Other >> Sep 16, 2017  3:01 PM Darl Householder, RMA wrote: Reason for CRM: patient is requesting a call back from Dr. Nicki Reaper or CMA concerning if she needs a follow up for pneumonia she has finished all medication

## 2017-09-16 NOTE — Telephone Encounter (Signed)
I can see her tomorrow at 4:00.  Thanks

## 2017-09-16 NOTE — Telephone Encounter (Signed)
Patient was seen on 1/15 and had a cough. Patient started feeling worse and decided to go to walk-in on Sunday 1/20. The MD at [facility] diagnosed her with Pneumonia. Patient was given 5 days of abx and prednisone taper. Patient has completed meds and is feeling much better but still has a cough and some congestion. She was wondering if you wanted her to follow up with you to be sure that pnuemonia has cleared up. Please advise.

## 2017-09-17 ENCOUNTER — Ambulatory Visit (INDEPENDENT_AMBULATORY_CARE_PROVIDER_SITE_OTHER): Payer: Medicare Other | Admitting: Internal Medicine

## 2017-09-17 ENCOUNTER — Encounter: Payer: Self-pay | Admitting: Internal Medicine

## 2017-09-17 DIAGNOSIS — J189 Pneumonia, unspecified organism: Secondary | ICD-10-CM | POA: Diagnosis not present

## 2017-09-17 DIAGNOSIS — I2510 Atherosclerotic heart disease of native coronary artery without angina pectoris: Secondary | ICD-10-CM | POA: Diagnosis not present

## 2017-09-17 DIAGNOSIS — E119 Type 2 diabetes mellitus without complications: Secondary | ICD-10-CM | POA: Diagnosis not present

## 2017-09-17 MED ORDER — LEVOFLOXACIN 500 MG PO TABS: 500.0000 mg | ORAL_TABLET | Freq: Every day | ORAL | 0 refills | Status: DC

## 2017-09-17 NOTE — Telephone Encounter (Signed)
Patient has already been scheduled

## 2017-09-17 NOTE — Progress Notes (Signed)
Patient ID: Debra Kirk, female   DOB: October 21, 1929, 84 y.o.   MRN: [id number]   Subjective:    Patient ID: Debra Kirk, female    DOB: 1930-05-05, 83 y.o.   MRN: [id number]  HPI  Patient here as a work in for f/u pneumonia.  States when symptoms started, she was sneezing.  Then developed low grade temp.  Symptoms progressed.  Increased cough and congestion.  Wheezing.  When to Fast [facility].  Had cxr.  Diagnosed with pneumonia.  Treated with levaquin and prednisone.  Also using inhalers.  Symptoms have improved.  Still with some cough and congestion.  Eating.  No nausea or vomiting.  No diarrhea.     Past Medical History:  Diagnosis Date  . Carotid artery disease ([place])   . Compression fracture    s/p fosamax, prolia  . Diabetes mellitus ([place])   . Femoral neuropathy    left, elevated CRP, ESR, negative FANA and ANCA  . Fibrocystic breast disease   . Hypercholesterolemia   . Hypertension    Past Surgical History:  Procedure Laterality Date  . APPENDECTOMY    . CHOLECYSTECTOMY    . HERNIA REPAIR    . INGUINAL HERNIA REPAIR     left x 1, right x 2  . OVARIAN CYST REMOVAL    . PARTIAL HYSTERECTOMY     Family History  Problem Relation Age of Onset  . Heart disease Father        myocardial infarction  . Heart disease Mother        myocardial infarction  . Diabetes Mother   . Congestive Heart Failure Mother   . Bone cancer Sister   . Diabetes Sister        x2  . Ovarian cancer Sister 82  . Heart disease Sister        CABG  . Rheum arthritis Sister   . Breast cancer Neg Hx   . Colon cancer Neg Hx    Social History   Socioeconomic History  . Marital status: Widowed    Spouse name: None  . Number of children: 3  . Years of education: None  . Highest education level: None  Social Needs  . Financial resource strain: None  . Food insecurity - worry: None  . Food insecurity - inability: None  . Transportation needs - medical: None  . Transportation needs -  non-medical: None  Occupational History  . None  Tobacco Use  . Smoking status: Former Smoker    Last attempt to quit: 08/20/1958    Years since quitting: 59.1  . Smokeless tobacco: Never Used  Substance and Sexual Activity  . Alcohol use: No    Alcohol/week: 0.0 oz  . Drug use: No  . Sexual activity: No  Other Topics Concern  . None  Social History Narrative  . None    Outpatient Encounter Medications as of 09/17/2017  Medication Sig  . albuterol (PROVENTIL HFA;VENTOLIN HFA) 108 (90 Base) MCG/ACT inhaler Inhale 2 puffs into the lungs every 6 (six) hours as needed for wheezing or shortness of breath.  . AMBULATORY NON FORMULARY MEDICATION Medication Name: Incentive Spirometry Use 10-15 times daily.  Marland Kitchen amLODipine (NORVASC) 5 MG tablet TAKE 1 TABLET BY MOUTH DAILY  . aspirin 81 MG tablet Take 81 mg by mouth daily.  . Calcium Carbonate-Vitamin D (CALCIUM 600+D) 600-400 MG-UNIT per tablet Take 1 tablet by mouth 2 (two) times daily.  Marland Kitchen denosumab (PROLIA) 60 MG/ML SOLN injection Inject  60 mg into the skin every 6 (six) months. Administer in upper arm, thigh, or abdomen  . glimepiride (AMARYL) 2 MG tablet Take 1 tablet (2 mg total) by mouth 2 (two) times daily.  Marland Kitchen glucose blood (BAYER CONTOUR TEST) test strip CHECK DAILY AND AS NEEDED **E11.9**  . levofloxacin (LEVAQUIN) 500 MG tablet Take 1 tablet (500 mg total) by mouth daily.  . meclizine (ANTIVERT) 12.5 MG tablet Take 1 tablet (12.5 mg total) by mouth 2 (two) times daily as needed for dizziness.  . metoCLOPramide (REGLAN) 5 MG tablet Take 5 mg by mouth 2 (two) times daily.   . metoprolol tartrate (LOPRESSOR) 50 MG tablet TAKE 1 AND 1/2 TABLET BY MOUTH TWICE A DAY  . omeprazole (PRILOSEC) 20 MG capsule Take 1 capsule (20 mg total) by mouth 2 (two) times daily.  Marland Kitchen Respiratory Therapy Supplies (FLUTTER) DEVI Use 10-15 times daily  . rosuvastatin (CRESTOR) 10 MG tablet Take 1 tablet (10 mg total) by mouth daily.   No facility-administered  encounter medications on file as of 09/17/2017.     Review of Systems  Constitutional: Negative for appetite change and unexpected weight change.  HENT: Positive for congestion and postnasal drip. Negative for sinus pressure.   Respiratory: Positive for cough. Negative for chest tightness and shortness of breath.   Cardiovascular: Negative for chest pain and leg swelling.  Gastrointestinal: Negative for abdominal pain, diarrhea, nausea and vomiting.  Genitourinary: Negative for difficulty urinating.  Skin: Negative for color change and rash.  Neurological: Negative for dizziness, light-headedness and headaches.  Psychiatric/Behavioral: Negative for agitation and dysphoric mood.       Objective:    Physical Exam  Constitutional: She appears well-developed and well-nourished. No distress.  HENT:  Nose: Nose normal.  Mouth/Throat: Oropharynx is clear and moist.  Neck: Neck supple.  Cardiovascular: Normal rate and regular rhythm.  Pulmonary/Chest: Breath sounds normal. No respiratory distress. She has no wheezes.  Abdominal: Soft. Bowel sounds are normal. There is no tenderness.  Musculoskeletal: She exhibits no edema or tenderness.  Lymphadenopathy:    She has no cervical adenopathy.    BP 130/76 (BP Location: Left Arm, Patient Position: Sitting, Cuff Size: Normal)   Pulse 68   Temp 98.3 F (36.8 C) (Oral)   Resp 20   Wt 154 lb 9.6 oz (70.1 kg)   SpO2 97%   BMI 24.95 kg/m  Wt Readings from Last 3 Encounters:  09/17/17 154 lb 9.6 oz (70.1 kg)  09/03/17 155 lb 9.6 oz (70.6 kg)  06/20/17 155 lb (70.3 kg)     Lab Results  Component Value Date   WBC 7.2 03/14/2017   HGB 12.8 03/14/2017   HCT 37.6 03/14/2017   PLT 223.0 03/14/2017   GLUCOSE 254 (H) 08/28/2017   CHOL 165 08/28/2017   TRIG 120.0 08/28/2017   HDL 44.10 08/28/2017   LDLCALC 97 08/28/2017   ALT 9 08/28/2017   AST 13 08/28/2017   NA 135 08/28/2017   K 3.9 08/28/2017   CL 100 08/28/2017   CREATININE  0.90 08/28/2017   BUN 13 08/28/2017   CO2 27 08/28/2017   TSH 1.72 05/29/2017   HGBA1C 8.2 (H) 08/28/2017   MICROALBUR 4.1 (H) 05/29/2017    Ct Chest Wo Contrast  Result Date: 06/17/2017 CLINICAL DATA:  Former smoker. Follow-up of pneumonia. Shortness of breath. EXAM: CT CHEST WITHOUT CONTRAST TECHNIQUE: Multidetector CT imaging of the chest was performed following the standard protocol without IV contrast. COMPARISON:  02/11/2017 chest  radiograph.  CT 01/15/2017. FINDINGS: Cardiovascular: Aortic and branch vessel atherosclerosis. Tortuous thoracic aorta. Mild cardiomegaly, without pericardial effusion. Multivessel coronary artery atherosclerosis. Pulmonary artery enlargement, 3.2 cm outflow tract. Mediastinum/Nodes: No supraclavicular adenopathy. Resolved thoracic adenopathy. Index right paratracheal node measures 8 mm on image 49/series 2 versus 1.2 cm on the prior. Resolved hilar adenopathy, suboptimally evaluated on this noncontrast exam. Lungs/Pleura: No pleural fluid. 3 mm right lower lobe pulmonary nodule an image 84/series 3. Not readily apparent on priors. Resolved bilateral pneumonia.  Mild centrilobular emphysema. Upper Abdomen: Normal imaged portions of the liver, spleen, stomach, adrenal glands, kidneys. Musculoskeletal: Moderate L1 compression deformity with ventral canal encroachment, similar. IMPRESSION: 1. Resolution of pneumonia and thoracic adenopathy. 2.  No acute process in the chest. 3. Right lower lobe 3 mm pulmonary nodule is not readily apparent on remote CTs. No follow-up needed if patient is low-risk. Non-contrast chest CT can be considered in 12 months if patient is high-risk. This recommendation follows the consensus statement: Guidelines for Management of Incidental Pulmonary Nodules Detected on CT Images: From the Fleischner Society 2017; Radiology 2017; 284:228-243. 4. Coronary artery atherosclerosis. Aortic Atherosclerosis (ICD10-I70.0). 5. Pulmonary artery enlargement  suggests pulmonary arterial hypertension. Electronically Signed   By: Abigail Miyamoto M.D.   On: 06/17/2017 10:39       Assessment & Plan:   Problem List Items Addressed This Visit    Diabetes mellitus type 2, uncomplicated ([place])    Follow sugars.  Was just on prednisone.  Stay hydrated.  Low carb diet and exercise.        Pneumonia    Recently diagnosed with pneumonia.  Being treated with levaquin and prednisone.  Completed levaquin.  Persistent cough and congestion.  Feeling better.  Eating.  Continue inhalers.  Hold on further prednisone.  Extend out levaquin.  Follow.  Will need repeat cxr in several weeks.       Relevant Medications   levofloxacin (LEVAQUIN) 500 MG tablet      I spent 25 minutes with the patient and more than 50% of the time was spent in consultation regarding the above.  tme spent discussing her symptoms and Fast [facility] evaluation.  Also discussed treatment and plans for further evaluation.   Einar Pheasant, MD

## 2017-09-21 ENCOUNTER — Encounter: Payer: Self-pay | Admitting: Internal Medicine

## 2017-09-21 NOTE — Assessment & Plan Note (Signed)
Recently diagnosed with pneumonia.  Being treated with levaquin and prednisone.  Completed levaquin.  Persistent cough and congestion.  Feeling better.  Eating.  Continue inhalers.  Hold on further prednisone.  Extend out levaquin.  Follow.  Will need repeat cxr in several weeks.

## 2017-09-21 NOTE — Assessment & Plan Note (Signed)
Follow sugars.  Was just on prednisone.  Stay hydrated.  Low carb diet and exercise.

## 2017-10-22 ENCOUNTER — Ambulatory Visit (INDEPENDENT_AMBULATORY_CARE_PROVIDER_SITE_OTHER): Payer: Medicare Other | Admitting: Internal Medicine

## 2017-10-22 ENCOUNTER — Ambulatory Visit (INDEPENDENT_AMBULATORY_CARE_PROVIDER_SITE_OTHER): Payer: Medicare Other

## 2017-10-22 ENCOUNTER — Encounter: Payer: Self-pay | Admitting: Internal Medicine

## 2017-10-22 VITALS — BP 140/82 | HR 67 | Temp 98.3°F | Resp 20 | Wt 153.0 lb

## 2017-10-22 DIAGNOSIS — R05 Cough: Secondary | ICD-10-CM

## 2017-10-22 DIAGNOSIS — K3184 Gastroparesis: Secondary | ICD-10-CM

## 2017-10-22 DIAGNOSIS — E119 Type 2 diabetes mellitus without complications: Secondary | ICD-10-CM

## 2017-10-22 DIAGNOSIS — E1143 Type 2 diabetes mellitus with diabetic autonomic (poly)neuropathy: Secondary | ICD-10-CM | POA: Diagnosis not present

## 2017-10-22 DIAGNOSIS — I70213 Atherosclerosis of native arteries of extremities with intermittent claudication, bilateral legs: Secondary | ICD-10-CM

## 2017-10-22 DIAGNOSIS — R0989 Other specified symptoms and signs involving the circulatory and respiratory systems: Secondary | ICD-10-CM | POA: Diagnosis not present

## 2017-10-22 DIAGNOSIS — J069 Acute upper respiratory infection, unspecified: Secondary | ICD-10-CM | POA: Diagnosis not present

## 2017-10-22 DIAGNOSIS — I2510 Atherosclerotic heart disease of native coronary artery without angina pectoris: Secondary | ICD-10-CM

## 2017-10-22 DIAGNOSIS — I10 Essential (primary) hypertension: Secondary | ICD-10-CM

## 2017-10-22 DIAGNOSIS — R059 Cough, unspecified: Secondary | ICD-10-CM

## 2017-10-22 DIAGNOSIS — E7800 Pure hypercholesterolemia, unspecified: Secondary | ICD-10-CM

## 2017-10-22 MED ORDER — ALBUTEROL SULFATE HFA 108 (90 BASE) MCG/ACT IN AERS: 2.0000 | INHALATION_SPRAY | Freq: Four times a day (QID) | RESPIRATORY_TRACT | 2 refills | Status: DC | PRN

## 2017-10-22 MED ORDER — DOXYCYCLINE HYCLATE 100 MG PO TABS: 100.0000 mg | ORAL_TABLET | Freq: Two times a day (BID) | ORAL | 0 refills | Status: DC

## 2017-10-22 NOTE — Patient Instructions (Signed)
Take doxycycline (antibiotic) twice a day.    Take a probiotic daily with the antibiotic and for two weeks after completing the antibiotic.    Take mucinex in the morning and robitussin DM in the evening.    Saline nasal spray - flush nose 2-3x/day  nasacort nasal spray - 2 sprays each nostril one time per day.  Do this in the evening.    Albuterol inhaler - 2 puffs 4x/day as needed.

## 2017-10-22 NOTE — Progress Notes (Signed)
Patient ID: Debra Kirk, female   DOB: Aug 02, 1930, 84 y.o.   MRN: [id number]   Subjective:    Patient ID: Debra Kirk, female    DOB: 1929/08/31, 84 y.o.   MRN: [id number]  HPI  Patient here for a scheduled follow up.  Was seen on 09/17/17 for f/u pneumonia.  Last visit, was having persistent symptoms.  abx extended.  Reports symptoms resolved.  Was doing well until this past week.  Reports increased cough and congestion.  No sinus pressure.  Some increased chest congestion.  Sore throat.  Taking tylenol.  Using her inhaler, but not using regularly.  Symptoms appear to be worsening. She does not feel as tight as previous.  Eating.  No vomiting.  Bowels moving.  No abdominal pain.  States sugars staying in the 200s.     Past Medical History:  Diagnosis Date  . Carotid artery disease ([place])   . Compression fracture    s/p fosamax, prolia  . Diabetes mellitus ([place])   . Femoral neuropathy    left, elevated CRP, ESR, negative FANA and ANCA  . Fibrocystic breast disease   . Hypercholesterolemia   . Hypertension    Past Surgical History:  Procedure Laterality Date  . APPENDECTOMY    . CHOLECYSTECTOMY    . HERNIA REPAIR    . INGUINAL HERNIA REPAIR     left x 1, right x 2  . OVARIAN CYST REMOVAL    . PARTIAL HYSTERECTOMY     Family History  Problem Relation Age of Onset  . Heart disease Father        myocardial infarction  . Heart disease Mother        myocardial infarction  . Diabetes Mother   . Congestive Heart Failure Mother   . Bone cancer Sister   . Diabetes Sister        x2  . Ovarian cancer Sister 80  . Heart disease Sister        CABG  . Rheum arthritis Sister   . Breast cancer Neg Hx   . Colon cancer Neg Hx    Social History   Socioeconomic History  . Marital status: Widowed    Spouse name: None  . Number of children: 3  . Years of education: None  . Highest education level: None  Social Needs  . Financial resource strain: None  . Food insecurity -  worry: None  . Food insecurity - inability: None  . Transportation needs - medical: None  . Transportation needs - non-medical: None  Occupational History  . None  Tobacco Use  . Smoking status: Former Smoker    Last attempt to quit: 08/20/1958    Years since quitting: 59.2  . Smokeless tobacco: Never Used  Substance and Sexual Activity  . Alcohol use: No    Alcohol/week: 0.0 oz  . Drug use: No  . Sexual activity: No  Other Topics Concern  . None  Social History Narrative  . None    Outpatient Encounter Medications as of 10/22/2017  Medication Sig  . albuterol (PROVENTIL HFA;VENTOLIN HFA) 108 (90 Base) MCG/ACT inhaler Inhale 2 puffs into the lungs every 6 (six) hours as needed for wheezing or shortness of breath.  . AMBULATORY NON FORMULARY MEDICATION Medication Name: Incentive Spirometry Use 10-15 times daily.  Marland Kitchen amLODipine (NORVASC) 5 MG tablet TAKE 1 TABLET BY MOUTH DAILY  . aspirin 81 MG tablet Take 81 mg by mouth daily.  . Calcium Carbonate-Vitamin D (CALCIUM  600+D) 600-400 MG-UNIT per tablet Take 1 tablet by mouth 2 (two) times daily.  Marland Kitchen denosumab (PROLIA) 60 MG/ML SOLN injection Inject 60 mg into the skin every 6 (six) months. Administer in upper arm, thigh, or abdomen  . glucose blood (BAYER CONTOUR TEST) test strip CHECK DAILY AND AS NEEDED **E11.9**  . meclizine (ANTIVERT) 12.5 MG tablet Take 1 tablet (12.5 mg total) by mouth 2 (two) times daily as needed for dizziness.  . metoCLOPramide (REGLAN) 5 MG tablet Take 5 mg by mouth 2 (two) times daily.   . metoprolol tartrate (LOPRESSOR) 50 MG tablet TAKE 1 AND 1/2 TABLET BY MOUTH TWICE A DAY  . omeprazole (PRILOSEC) 20 MG capsule Take 1 capsule (20 mg total) by mouth 2 (two) times daily.  Marland Kitchen Respiratory Therapy Supplies (FLUTTER) DEVI Use 10-15 times daily  . rosuvastatin (CRESTOR) 10 MG tablet Take 1 tablet (10 mg total) by mouth daily.  . [DISCONTINUED] albuterol (PROVENTIL HFA;VENTOLIN HFA) 108 (90 Base) MCG/ACT inhaler  Inhale 2 puffs into the lungs every 6 (six) hours as needed for wheezing or shortness of breath.  . doxycycline (VIBRA-TABS) 100 MG tablet Take 1 tablet (100 mg total) by mouth 2 (two) times daily.  Marland Kitchen glimepiride (AMARYL) 2 MG tablet Take 1 tablet (2 mg total) by mouth 2 (two) times daily.  . [DISCONTINUED] levofloxacin (LEVAQUIN) 500 MG tablet Take 1 tablet (500 mg total) by mouth daily.   No facility-administered encounter medications on file as of 10/22/2017.     Review of Systems  Constitutional: Negative for appetite change and unexpected weight change.  HENT: Positive for congestion and sore throat. Negative for sinus pressure.   Respiratory: Positive for cough. Negative for chest tightness.        No increased sob.  Increased chest congestion.   Cardiovascular: Negative for chest pain, palpitations and leg swelling.  Gastrointestinal: Negative for abdominal pain, diarrhea, nausea and vomiting.  Genitourinary: Negative for difficulty urinating and dysuria.  Musculoskeletal: Negative for joint swelling and myalgias.  Skin: Negative for color change and rash.  Neurological: Negative for dizziness, light-headedness and headaches.  Psychiatric/Behavioral: Negative for agitation and dysphoric mood.       Objective:     Blood pressure rechecked by me:  140/82  Physical Exam  Constitutional: She appears well-developed and well-nourished. No distress.  HENT:  Nose: Nose normal.  Mouth/Throat: Oropharynx is clear and moist.  Neck: Neck supple. No thyromegaly present.  Cardiovascular: Normal rate and regular rhythm.  Pulmonary/Chest: Breath sounds normal. No respiratory distress. She has no wheezes.  Abdominal: Soft. Bowel sounds are normal. There is no tenderness.  Musculoskeletal: She exhibits no edema or tenderness.  Lymphadenopathy:    She has no cervical adenopathy.  Skin: No rash noted. No erythema.  Psychiatric: She has a normal mood and affect. Her behavior is normal.     BP 140/82   Pulse 67   Temp 98.3 F (36.8 C) (Oral)   Resp 20   Wt 153 lb (69.4 kg)   SpO2 99%   BMI 24.69 kg/m  Wt Readings from Last 3 Encounters:  10/22/17 153 lb (69.4 kg)  09/17/17 154 lb 9.6 oz (70.1 kg)  09/03/17 155 lb 9.6 oz (70.6 kg)     Lab Results  Component Value Date   WBC 7.2 03/14/2017   HGB 12.8 03/14/2017   HCT 37.6 03/14/2017   PLT 223.0 03/14/2017   GLUCOSE 254 (H) 08/28/2017   CHOL 165 08/28/2017   TRIG 120.0 08/28/2017  HDL 44.10 08/28/2017   LDLCALC 97 08/28/2017   ALT 9 08/28/2017   AST 13 08/28/2017   NA 135 08/28/2017   K 3.9 08/28/2017   CL 100 08/28/2017   CREATININE 0.90 08/28/2017   BUN 13 08/28/2017   CO2 27 08/28/2017   TSH 1.72 05/29/2017   HGBA1C 8.2 (H) 08/28/2017   MICROALBUR 4.1 (H) 05/29/2017    Ct Chest Wo Contrast  Result Date: 06/17/2017 CLINICAL DATA:  Former smoker. Follow-up of pneumonia. Shortness of breath. EXAM: CT CHEST WITHOUT CONTRAST TECHNIQUE: Multidetector CT imaging of the chest was performed following the standard protocol without IV contrast. COMPARISON:  02/11/2017 chest radiograph.  CT 01/15/2017. FINDINGS: Cardiovascular: Aortic and branch vessel atherosclerosis. Tortuous thoracic aorta. Mild cardiomegaly, without pericardial effusion. Multivessel coronary artery atherosclerosis. Pulmonary artery enlargement, 3.2 cm outflow tract. Mediastinum/Nodes: No supraclavicular adenopathy. Resolved thoracic adenopathy. Index right paratracheal node measures 8 mm on image 49/series 2 versus 1.2 cm on the prior. Resolved hilar adenopathy, suboptimally evaluated on this noncontrast exam. Lungs/Pleura: No pleural fluid. 3 mm right lower lobe pulmonary nodule an image 84/series 3. Not readily apparent on priors. Resolved bilateral pneumonia.  Mild centrilobular emphysema. Upper Abdomen: Normal imaged portions of the liver, spleen, stomach, adrenal glands, kidneys. Musculoskeletal: Moderate L1 compression deformity with  ventral canal encroachment, similar. IMPRESSION: 1. Resolution of pneumonia and thoracic adenopathy. 2.  No acute process in the chest. 3. Right lower lobe 3 mm pulmonary nodule is not readily apparent on remote CTs. No follow-up needed if patient is low-risk. Non-contrast chest CT can be considered in 12 months if patient is high-risk. This recommendation follows the consensus statement: Guidelines for Management of Incidental Pulmonary Nodules Detected on CT Images: From the Fleischner Society 2017; Radiology 2017; 284:228-243. 4. Coronary artery atherosclerosis. Aortic Atherosclerosis (ICD10-I70.0). 5. Pulmonary artery enlargement suggests pulmonary arterial hypertension. Electronically Signed   By: Abigail Miyamoto M.D.   On: 06/17/2017 10:39       Assessment & Plan:   Problem List Items Addressed This Visit    Atherosclerosis of native arteries of extremity with intermittent claudication (HCC)    Saw Dr Ronalee Belts.  Stable.  Continue risk factor modification.        CAD (coronary artery disease)    Continue risk factor modification.  Has been stable.        Cough - Primary   Relevant Orders   DG Chest 2 View (Completed)   Diabetes mellitus type 2, uncomplicated (HCC)    Sugars elevated.  Had previously been on prednisone.  Off now.  Treat current infection.  Has been unable to take metformin.  She will send in sugar readings.  Adjust medications.  Consider jardiance, etc.        Gastroparesis due to DM ([place])    On reglan.  Followed by GI.       Hypercholesterolemia    On crestor.  Low cholesterol diet and exercise.  Follow lipid panel and liver function tests.        Hypertension    Blood pressure on recheck improved.  Follow pressures.  Same medication regimen.        URI (upper respiratory infection)    Increased cough and congestion as outlined.  Treat with doxycycline as directed.  Hold on prednisone.  Saline nasal spray and steroid nasal spray as directed.  Mucinex/robitussin.   Inhaler as directed.  Follow.           Einar Pheasant, MD

## 2017-10-25 ENCOUNTER — Encounter: Payer: Self-pay | Admitting: Internal Medicine

## 2017-10-25 NOTE — Assessment & Plan Note (Signed)
Blood pressure on recheck improved.  Follow pressures.  Same medication regimen.

## 2017-10-25 NOTE — Assessment & Plan Note (Signed)
On reglan.  Followed by GI.

## 2017-10-25 NOTE — Assessment & Plan Note (Signed)
Continue risk factor modification.  Has been stable.

## 2017-10-25 NOTE — Assessment & Plan Note (Signed)
Sugars elevated.  Had previously been on prednisone.  Off now.  Treat current infection.  Has been unable to take metformin.  She will send in sugar readings.  Adjust medications.  Consider jardiance, etc.

## 2017-10-25 NOTE — Assessment & Plan Note (Signed)
Saw Dr Ronalee Belts.  Stable.  Continue risk factor modification.

## 2017-10-25 NOTE — Assessment & Plan Note (Signed)
Increased cough and congestion as outlined.  Treat with doxycycline as directed.  Hold on prednisone.  Saline nasal spray and steroid nasal spray as directed.  Mucinex/robitussin.  Inhaler as directed.  Follow.

## 2017-10-25 NOTE — Assessment & Plan Note (Signed)
On crestor.  Low cholesterol diet and exercise.  Follow lipid panel and liver function tests.   

## 2017-10-28 ENCOUNTER — Other Ambulatory Visit: Payer: Self-pay | Admitting: Internal Medicine

## 2017-10-29 DIAGNOSIS — R197 Diarrhea, unspecified: Secondary | ICD-10-CM | POA: Diagnosis not present

## 2017-10-29 DIAGNOSIS — K219 Gastro-esophageal reflux disease without esophagitis: Secondary | ICD-10-CM | POA: Diagnosis not present

## 2017-10-29 DIAGNOSIS — K3184 Gastroparesis: Secondary | ICD-10-CM | POA: Diagnosis not present

## 2017-11-11 ENCOUNTER — Other Ambulatory Visit: Payer: Self-pay | Admitting: Internal Medicine

## 2017-12-04 ENCOUNTER — Other Ambulatory Visit (INDEPENDENT_AMBULATORY_CARE_PROVIDER_SITE_OTHER): Payer: Medicare Other

## 2017-12-04 DIAGNOSIS — E119 Type 2 diabetes mellitus without complications: Secondary | ICD-10-CM

## 2017-12-04 DIAGNOSIS — E7800 Pure hypercholesterolemia, unspecified: Secondary | ICD-10-CM

## 2017-12-04 LAB — BASIC METABOLIC PANEL
BUN: 12 mg/dL (ref 6–23)
CO2: 29 meq/L (ref 19–32)
CREATININE: 0.96 mg/dL (ref 0.40–1.20)
Calcium: 9.5 mg/dL (ref 8.4–10.5)
Chloride: 98 mEq/L (ref 96–112)
GFR: 58.56 mL/min — ABNORMAL LOW (ref >60.00)
GLUCOSE: 250 mg/dL — AB (ref 70–99)
Potassium: 4 mEq/L (ref 3.5–5.1)
Sodium: 134 mEq/L — ABNORMAL LOW (ref 135–145)

## 2017-12-04 LAB — HEPATIC FUNCTION PANEL
ALT: 10 U/L (ref 0–35)
AST: 13 U/L (ref 0–37)
Albumin: 4 g/dL (ref 3.5–5.2)
Alkaline Phosphatase: 43 U/L (ref 39–117)
BILIRUBIN TOTAL: 0.7 mg/dL (ref 0.2–1.2)
Bilirubin, Direct: 0.1 mg/dL (ref 0.0–0.3)
TOTAL PROTEIN: 7.4 g/dL (ref 6.0–8.3)

## 2017-12-04 LAB — LIPID PANEL
CHOL/HDL RATIO: 3
CHOLESTEROL: 168 mg/dL (ref 0–200)
HDL: 48.3 mg/dL (ref >39.00)
LDL CALC: 98 mg/dL (ref 0–99)
NonHDL: 120.13
TRIGLYCERIDES: 111 mg/dL (ref 0.0–149.0)
VLDL: 22.2 mg/dL (ref 0.0–40.0)

## 2017-12-04 LAB — HEMOGLOBIN A1C: HEMOGLOBIN A1C: 8.9 % — AB (ref 4.6–6.5)

## 2017-12-05 ENCOUNTER — Other Ambulatory Visit: Payer: Self-pay | Admitting: Internal Medicine

## 2017-12-05 DIAGNOSIS — R413 Other amnesia: Secondary | ICD-10-CM

## 2017-12-05 DIAGNOSIS — D649 Anemia, unspecified: Secondary | ICD-10-CM

## 2017-12-05 NOTE — Progress Notes (Signed)
Orders placed for f/u labs.  

## 2017-12-09 ENCOUNTER — Telehealth: Payer: Self-pay | Admitting: Internal Medicine

## 2017-12-09 NOTE — Telephone Encounter (Signed)
-----   Message from Carlean Jews, [facility] sent at 12/05/2017  2:07 PM EDT ----- Regarding: RE: question Hey,  I'd try to avoid TZDs (like actos) because of her age/steroid use and risk for osteoporosis and h/o fracture. The only agents that have data for steroid induced hyperglycemia are insulin and sulfonylureas. That being said, if you want to stave off insulin therapy, we can try an SGLT2 inhibitor. They make sense mechanistically. Her insurance plan is [employer] ([place]) - Vania Rea is covered. OK for her renal function.   Chrys Racer ----- Message ----- From: Einar Pheasant, MD Sent: 12/05/2017   5:47 AM To: Carlean Jews, RPH Subject: question                                       Ms Rispoli has had to be on prednisone intermittently for some pulmonary issues.  Previously she had been under reasonable control on amaryl only.  Her a1c has been increasing.  Not looking for tight control, but is now increasing.  She was unable to take metformin.  What do you suggest given her age and history?  I am going to talk with her about getting an appointment with you.    Thanks for your help. Einar Pheasant

## 2017-12-10 ENCOUNTER — Ambulatory Visit (INDEPENDENT_AMBULATORY_CARE_PROVIDER_SITE_OTHER): Payer: Medicare Other | Admitting: Internal Medicine

## 2017-12-10 DIAGNOSIS — E119 Type 2 diabetes mellitus without complications: Secondary | ICD-10-CM

## 2017-12-10 DIAGNOSIS — I70213 Atherosclerosis of native arteries of extremities with intermittent claudication, bilateral legs: Secondary | ICD-10-CM | POA: Diagnosis not present

## 2017-12-10 DIAGNOSIS — I2510 Atherosclerotic heart disease of native coronary artery without angina pectoris: Secondary | ICD-10-CM

## 2017-12-10 DIAGNOSIS — K3184 Gastroparesis: Secondary | ICD-10-CM

## 2017-12-10 DIAGNOSIS — E1143 Type 2 diabetes mellitus with diabetic autonomic (poly)neuropathy: Secondary | ICD-10-CM | POA: Diagnosis not present

## 2017-12-10 DIAGNOSIS — J479 Bronchiectasis, uncomplicated: Secondary | ICD-10-CM

## 2017-12-10 DIAGNOSIS — I10 Essential (primary) hypertension: Secondary | ICD-10-CM

## 2017-12-10 DIAGNOSIS — R911 Solitary pulmonary nodule: Secondary | ICD-10-CM

## 2017-12-10 DIAGNOSIS — E7800 Pure hypercholesterolemia, unspecified: Secondary | ICD-10-CM | POA: Diagnosis not present

## 2017-12-10 MED ORDER — EMPAGLIFLOZIN 10 MG PO TABS: 10.0000 mg | ORAL_TABLET | Freq: Every day | ORAL | 2 refills | Status: DC

## 2017-12-10 NOTE — Progress Notes (Signed)
Patient ID: Debra Kirk, female   DOB: [birth year], 84 y.o.   MRN: [id number]   Subjective:    Patient ID: Debra Kirk, female    DOB: 1930/05/03, 84 y.o.   MRN: [id number]  HPI  Patient here for a scheduled follow up.  States she feels better.  Breathing better.  No chest pain.  No increased cough or congestion.  No sob.  Eating.  No acid reflux.  No abdominal pain.  Bowels moving.  Previous CT scan - RLL nodule.  Recommended f/u CT in 12 months.  Last 05-2017.  Blood sugars elevated.  Reviewed outside readings.  (sugars 180-200s -200).  Was hoping would improve now off prednisone.  Discussed diet and exercise.  Doing well with her swallowing.  Saw GI 10/19/17 for f/u gastroparesis. Stable on reglan.  Recommended f/u in one year.     Past Medical History:  Diagnosis Date  . Carotid artery disease ([place])   . Compression fracture    s/p fosamax, prolia  . Diabetes mellitus ([place])   . Femoral neuropathy    left, elevated CRP, ESR, negative FANA and ANCA  . Fibrocystic breast disease   . Hypercholesterolemia   . Hypertension    Past Surgical History:  Procedure Laterality Date  . APPENDECTOMY    . CHOLECYSTECTOMY    . HERNIA REPAIR    . INGUINAL HERNIA REPAIR     left x 1, right x 2  . OVARIAN CYST REMOVAL    . PARTIAL HYSTERECTOMY     Family History  Problem Relation Age of Onset  . Heart disease Father        myocardial infarction  . Heart disease Mother        myocardial infarction  . Diabetes Mother   . Congestive Heart Failure Mother   . Bone cancer Sister   . Diabetes Sister        x2  . Ovarian cancer Sister 12  . Heart disease Sister        CABG  . Rheum arthritis Sister   . Breast cancer Neg Hx   . Colon cancer Neg Hx    Social History   Socioeconomic History  . Marital status: Widowed    Spouse name: Not on file  . Number of children: 3  . Years of education: Not on file  . Highest education level: Not on file  Occupational History  . Not on file   Social Needs  . Financial resource strain: Not on file  . Food insecurity:    Worry: Not on file    Inability: Not on file  . Transportation needs:    Medical: Not on file    Non-medical: Not on file  Tobacco Use  . Smoking status: Former Smoker    Last attempt to quit: 08/20/1958    Years since quitting: 59.3  . Smokeless tobacco: Never Used  Substance and Sexual Activity  . Alcohol use: No    Alcohol/week: 0.0 oz  . Drug use: No  . Sexual activity: Never  Lifestyle  . Physical activity:    Days per week: Not on file    Minutes per session: Not on file  . Stress: Not on file  Relationships  . Social connections:    Talks on phone: Not on file    Gets together: Not on file    Attends religious service: Not on file    Active member of club or organization: Not on file  Attends meetings of clubs or organizations: Not on file    Relationship status: Not on file  Other Topics Concern  . Not on file  Social History Narrative  . Not on file    Outpatient Encounter Medications as of 12/10/2017  Medication Sig  . albuterol (PROVENTIL HFA;VENTOLIN HFA) 108 (90 Base) MCG/ACT inhaler Inhale 2 puffs into the lungs every 6 (six) hours as needed for wheezing or shortness of breath.  . AMBULATORY NON FORMULARY MEDICATION Medication Name: Incentive Spirometry Use 10-15 times daily.  Marland Kitchen amLODipine (NORVASC) 5 MG tablet TAKE 1 TABLET BY MOUTH DAILY  . aspirin 81 MG tablet Take 81 mg by mouth daily.  . Calcium Carbonate-Vitamin D (CALCIUM 600+D) 600-400 MG-UNIT per tablet Take 1 tablet by mouth 2 (two) times daily.  Marland Kitchen denosumab (PROLIA) 60 MG/ML SOLN injection Inject 60 mg into the skin every 6 (six) months. Administer in upper arm, thigh, or abdomen  . empagliflozin (JARDIANCE) 10 MG TABS tablet Take 10 mg by mouth daily.  Marland Kitchen glimepiride (AMARYL) 2 MG tablet Take 1 tablet (2 mg total) by mouth 2 (two) times daily.  Marland Kitchen glucose blood (BAYER CONTOUR TEST) test strip CHECK DAILY AND AS NEEDED  **E11.9**  . meclizine (ANTIVERT) 12.5 MG tablet Take 1 tablet (12.5 mg total) by mouth 2 (two) times daily as needed for dizziness.  . metoCLOPramide (REGLAN) 5 MG tablet Take 5 mg by mouth 2 (two) times daily.   . metoprolol tartrate (LOPRESSOR) 50 MG tablet TAKE 1 AND 1/2 TABLET BY MOUTH TWICE A DAY  . omeprazole (PRILOSEC) 20 MG capsule Take 1 capsule (20 mg total) by mouth 2 (two) times daily.  Marland Kitchen Respiratory Therapy Supplies (FLUTTER) DEVI Use 10-15 times daily  . rosuvastatin (CRESTOR) 10 MG tablet Take 1 tablet (10 mg total) by mouth daily.  . rosuvastatin (CRESTOR) 5 MG tablet TAKE 1 TABLET (5 MG TOTAL) BY MOUTH DAILY.  . [DISCONTINUED] doxycycline (VIBRA-TABS) 100 MG tablet Take 1 tablet (100 mg total) by mouth 2 (two) times daily.   No facility-administered encounter medications on file as of 12/10/2017.     Review of Systems  Constitutional: Negative for appetite change and unexpected weight change.  HENT: Negative for congestion and sinus pressure.   Respiratory: Negative for cough, chest tightness and shortness of breath.   Cardiovascular: Negative for chest pain, palpitations and leg swelling.  Gastrointestinal: Negative for abdominal pain, diarrhea, nausea and vomiting.  Genitourinary: Negative for difficulty urinating and dysuria.  Musculoskeletal: Negative for joint swelling and myalgias.  Skin: Negative for color change and rash.  Neurological: Negative for dizziness, light-headedness and headaches.  Psychiatric/Behavioral: Negative for agitation and dysphoric mood.       Objective:     Blood pressure rechecked by me:  148/78  Physical Exam  Constitutional: She appears well-developed and well-nourished. No distress.  HENT:  Nose: Nose normal.  Mouth/Throat: Oropharynx is clear and moist.  Neck: Neck supple. No thyromegaly present.  Cardiovascular: Normal rate and regular rhythm.  Pulmonary/Chest: Breath sounds normal. No respiratory distress. She has no wheezes.   Abdominal: Soft. Bowel sounds are normal. There is no tenderness.  Musculoskeletal: She exhibits no edema or tenderness.  Feet:  No lesions.  DP pulses palpable and equal bilaterally.  Sensation intact to light touch and pin prick.    Lymphadenopathy:    She has no cervical adenopathy.  Skin: No rash noted. No erythema.  Psychiatric: She has a normal mood and affect. Her behavior is normal.  BP (!) 148/78   Pulse 62   Temp 97.9 F (36.6 C) (Oral)   Resp 18   Wt 152 lb (68.9 kg)   SpO2 96%   BMI 24.53 kg/m  Wt Readings from Last 3 Encounters:  12/10/17 152 lb (68.9 kg)  10/22/17 153 lb (69.4 kg)  09/17/17 154 lb 9.6 oz (70.1 kg)     Lab Results  Component Value Date   WBC 7.2 03/14/2017   HGB 12.8 03/14/2017   HCT 37.6 03/14/2017   PLT 223.0 03/14/2017   GLUCOSE 250 (H) 12/04/2017   CHOL 168 12/04/2017   TRIG 111.0 12/04/2017   HDL 48.30 12/04/2017   LDLCALC 98 12/04/2017   ALT 10 12/04/2017   AST 13 12/04/2017   NA 134 (L) 12/04/2017   K 4.0 12/04/2017   CL 98 12/04/2017   CREATININE 0.96 12/04/2017   BUN 12 12/04/2017   CO2 29 12/04/2017   TSH 1.72 05/29/2017   HGBA1C 8.9 (H) 12/04/2017   MICROALBUR 4.1 (H) 05/29/2017    Ct Chest Wo Contrast  Result Date: 06/17/2017 CLINICAL DATA:  Former smoker. Follow-up of pneumonia. Shortness of breath. EXAM: CT CHEST WITHOUT CONTRAST TECHNIQUE: Multidetector CT imaging of the chest was performed following the standard protocol without IV contrast. COMPARISON:  02/11/2017 chest radiograph.  CT 01/15/2017. FINDINGS: Cardiovascular: Aortic and branch vessel atherosclerosis. Tortuous thoracic aorta. Mild cardiomegaly, without pericardial effusion. Multivessel coronary artery atherosclerosis. Pulmonary artery enlargement, 3.2 cm outflow tract. Mediastinum/Nodes: No supraclavicular adenopathy. Resolved thoracic adenopathy. Index right paratracheal node measures 8 mm on image 49/series 2 versus 1.2 cm on the prior. Resolved  hilar adenopathy, suboptimally evaluated on this noncontrast exam. Lungs/Pleura: No pleural fluid. 3 mm right lower lobe pulmonary nodule an image 84/series 3. Not readily apparent on priors. Resolved bilateral pneumonia.  Mild centrilobular emphysema. Upper Abdomen: Normal imaged portions of the liver, spleen, stomach, adrenal glands, kidneys. Musculoskeletal: Moderate L1 compression deformity with ventral canal encroachment, similar. IMPRESSION: 1. Resolution of pneumonia and thoracic adenopathy. 2.  No acute process in the chest. 3. Right lower lobe 3 mm pulmonary nodule is not readily apparent on remote CTs. No follow-up needed if patient is low-risk. Non-contrast chest CT can be considered in 12 months if patient is high-risk. This recommendation follows the consensus statement: Guidelines for Management of Incidental Pulmonary Nodules Detected on CT Images: From the Fleischner Society 2017; Radiology 2017; 284:228-243. 4. Coronary artery atherosclerosis. Aortic Atherosclerosis (ICD10-I70.0). 5. Pulmonary artery enlargement suggests pulmonary arterial hypertension. Electronically Signed   By: Abigail Miyamoto M.D.   On: 06/17/2017 10:39       Assessment & Plan:   Problem List Items Addressed This Visit    Atherosclerosis of native arteries of extremity with intermittent claudication ([place])    Continue risk factor modification.  Stable.  Saw Dr Delana Meyer.       Bronchiectasis without complication ([place])    Evaluated by Dr Mortimer Fries and recently by Dr Alva Garnet.  CT as outlined.  Recommended f/u in one year.        CAD (coronary artery disease)    Stable.  Continue risk factor modification.       Diabetes mellitus type 2, uncomplicated (HCC)    Sugars elevated. Have been increasing.  Was on prednisone previously on multiple occasions.  Off now.  Breathing better.  Discussed with her today.  Start jardiance.  Follow sugars.  Discussed sick protocol with jardiance.  Follow met b and a1c.  Relevant  Medications   empagliflozin (JARDIANCE) 10 MG TABS tablet   Gastroparesis due to DM ([place])    Followed by GI.  On reglan.  Just evaluated.  Stable.       Relevant Medications   empagliflozin (JARDIANCE) 10 MG TABS tablet   Hypercholesterolemia    On crestor.  Low cholesterol diet and exercise.  Follow lipid panel and liver function tests.   Lab Results  Component Value Date   CHOL 168 12/04/2017   HDL 48.30 12/04/2017   LDLCALC 98 12/04/2017   TRIG 111.0 12/04/2017   CHOLHDL 3 12/04/2017        Hypertension    Blood pressure as outlined.  Continue same medication regimen.  Follow pressures.  Follow metabolic panel.       Lung nodule    Noted on CT.  Recommended f/u in one year.            Einar Pheasant, MD

## 2017-12-14 ENCOUNTER — Encounter: Payer: Self-pay | Admitting: Internal Medicine

## 2017-12-14 DIAGNOSIS — J479 Bronchiectasis, uncomplicated: Secondary | ICD-10-CM | POA: Insufficient documentation

## 2017-12-14 DIAGNOSIS — R911 Solitary pulmonary nodule: Secondary | ICD-10-CM | POA: Insufficient documentation

## 2017-12-14 NOTE — Assessment & Plan Note (Signed)
On crestor.  Low cholesterol diet and exercise.  Follow lipid panel and liver function tests.   Lab Results  Component Value Date   CHOL 168 12/04/2017   HDL 48.30 12/04/2017   LDLCALC 98 12/04/2017   TRIG 111.0 12/04/2017   CHOLHDL 3 12/04/2017

## 2017-12-14 NOTE — Assessment & Plan Note (Signed)
Followed by GI.  On reglan.  Just evaluated.  Stable.

## 2017-12-14 NOTE — Assessment & Plan Note (Signed)
Continue risk factor modification.  Stable.  Saw Dr Delana Meyer.

## 2017-12-14 NOTE — Assessment & Plan Note (Signed)
Blood pressure as outlined.  Continue same medication regimen.  Follow pressures.  Follow metabolic panel.  

## 2017-12-14 NOTE — Assessment & Plan Note (Signed)
Noted on CT.  Recommended f/u in one year.

## 2017-12-14 NOTE — Assessment & Plan Note (Signed)
Stable. Continue risk factor modification.  

## 2017-12-14 NOTE — Assessment & Plan Note (Signed)
Evaluated by Dr Mortimer Fries and recently by Dr Alva Garnet.  CT as outlined.  Recommended f/u in one year.

## 2017-12-14 NOTE — Assessment & Plan Note (Signed)
Sugars elevated. Have been increasing.  Was on prednisone previously on multiple occasions.  Off now.  Breathing better.  Discussed with her today.  Start jardiance.  Follow sugars.  Discussed sick protocol with jardiance.  Follow met b and a1c.

## 2018-01-01 DIAGNOSIS — L570 Actinic keratosis: Secondary | ICD-10-CM | POA: Diagnosis not present

## 2018-01-01 DIAGNOSIS — Z08 Encounter for follow-up examination after completed treatment for malignant neoplasm: Secondary | ICD-10-CM | POA: Diagnosis not present

## 2018-01-01 DIAGNOSIS — Z85828 Personal history of other malignant neoplasm of skin: Secondary | ICD-10-CM | POA: Diagnosis not present

## 2018-01-01 DIAGNOSIS — L821 Other seborrheic keratosis: Secondary | ICD-10-CM | POA: Diagnosis not present

## 2018-01-01 DIAGNOSIS — X32XXXA Exposure to sunlight, initial encounter: Secondary | ICD-10-CM | POA: Diagnosis not present

## 2018-01-21 ENCOUNTER — Other Ambulatory Visit: Payer: Self-pay | Admitting: Internal Medicine

## 2018-01-22 ENCOUNTER — Other Ambulatory Visit (INDEPENDENT_AMBULATORY_CARE_PROVIDER_SITE_OTHER): Payer: Self-pay

## 2018-01-22 DIAGNOSIS — I70213 Atherosclerosis of native arteries of extremities with intermittent claudication, bilateral legs: Secondary | ICD-10-CM

## 2018-01-22 DIAGNOSIS — I70219 Atherosclerosis of native arteries of extremities with intermittent claudication, unspecified extremity: Secondary | ICD-10-CM

## 2018-01-23 DIAGNOSIS — M810 Age-related osteoporosis without current pathological fracture: Secondary | ICD-10-CM | POA: Diagnosis not present

## 2018-02-03 ENCOUNTER — Ambulatory Visit (INDEPENDENT_AMBULATORY_CARE_PROVIDER_SITE_OTHER): Payer: Medicare Other | Admitting: Vascular Surgery

## 2018-02-03 ENCOUNTER — Encounter (INDEPENDENT_AMBULATORY_CARE_PROVIDER_SITE_OTHER): Payer: Medicare Other

## 2018-02-06 DIAGNOSIS — M8000XD Age-related osteoporosis with current pathological fracture, unspecified site, subsequent encounter for fracture with routine healing: Secondary | ICD-10-CM | POA: Diagnosis not present

## 2018-02-22 IMAGING — US US EXTREM LOW VENOUS*L*
1 series · 13 of 24 positions shown · non-contrast
Comparison: None.

CLINICAL DATA: Left lower extremity swelling.



[Series 1: us extrem low venous*left* · 0.08mm/px · 13 of 45 slices shown]
[im 1/45]
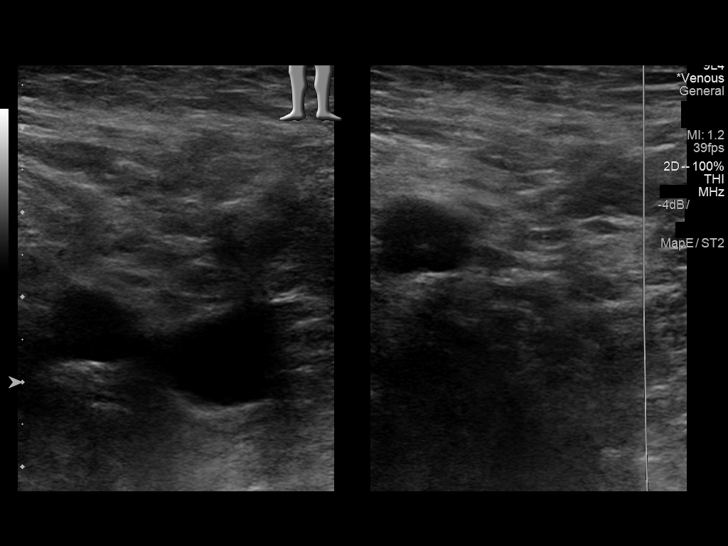
[im 4/45]
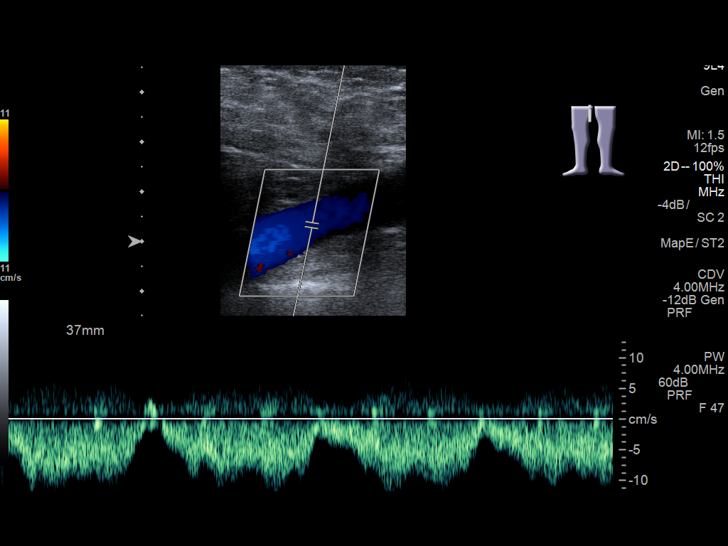
[im 8/45]
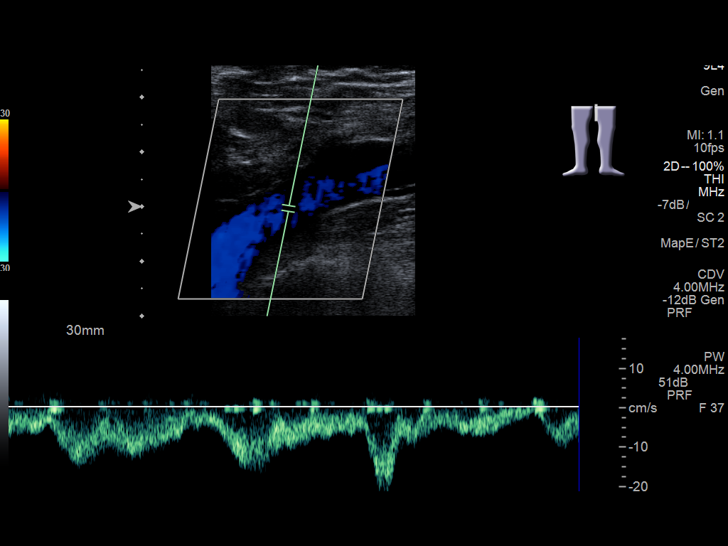
[im 12/45]
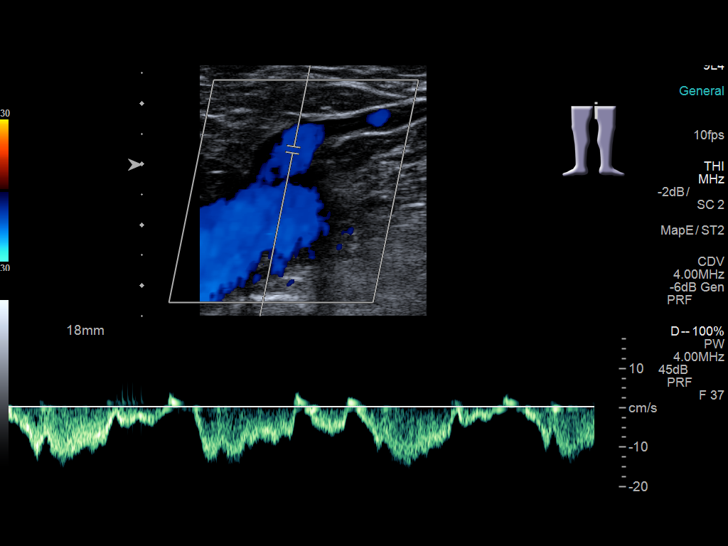
[im 16/45]
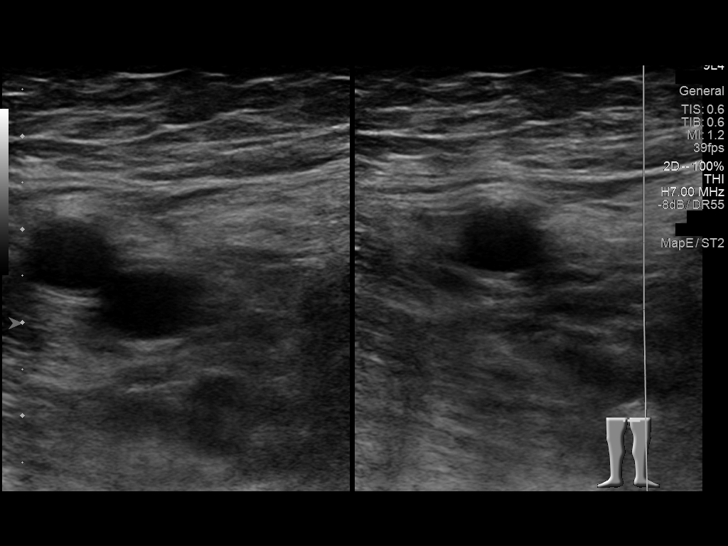
[im 20/45]
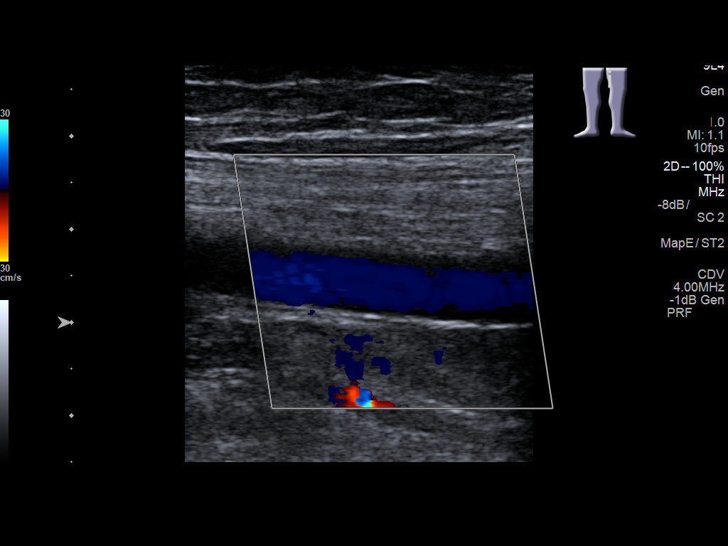
[im 23/45]
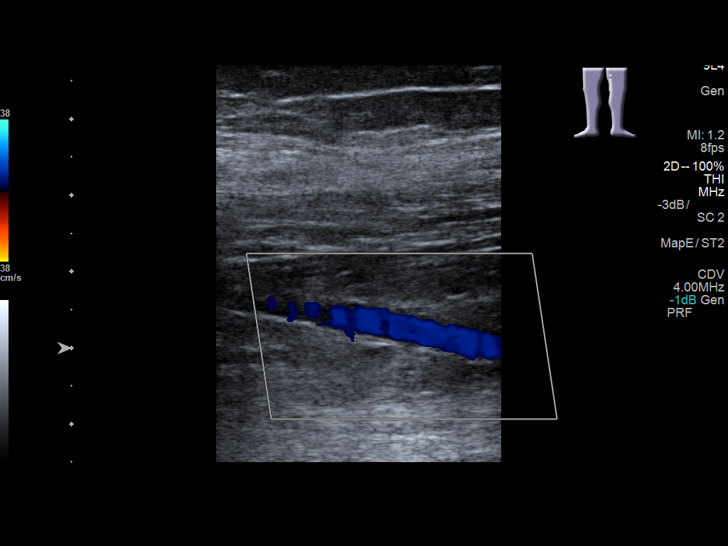
[im 25/45]
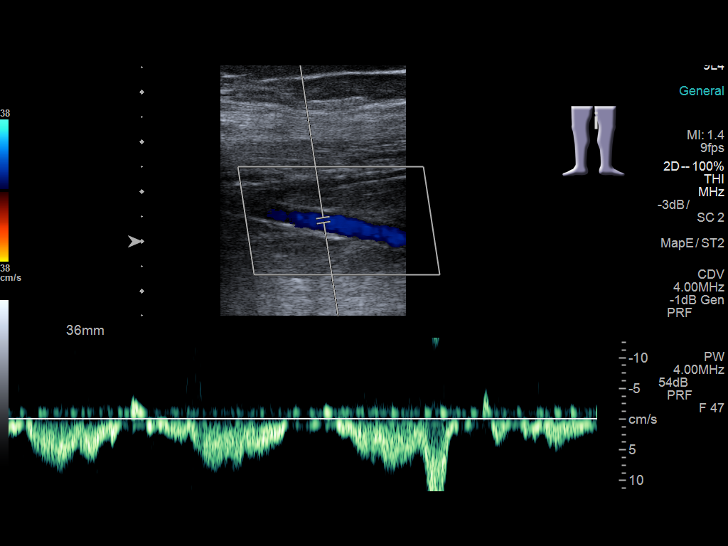
[im 29/45]
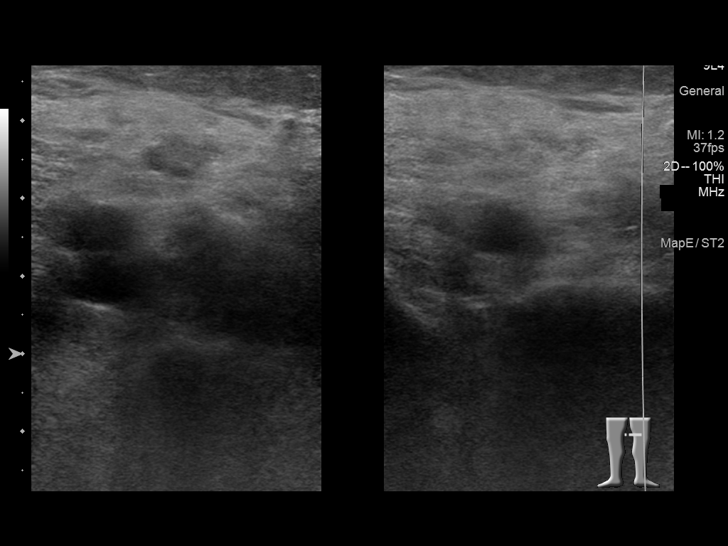
[im 33/45]
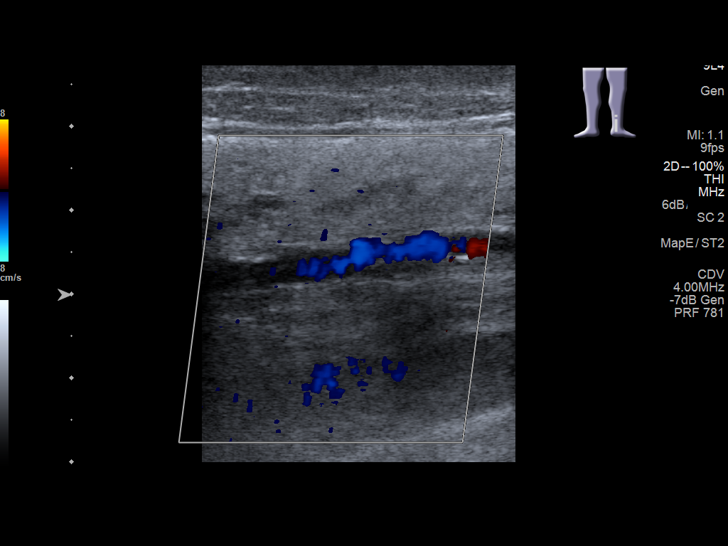
[im 37/45]
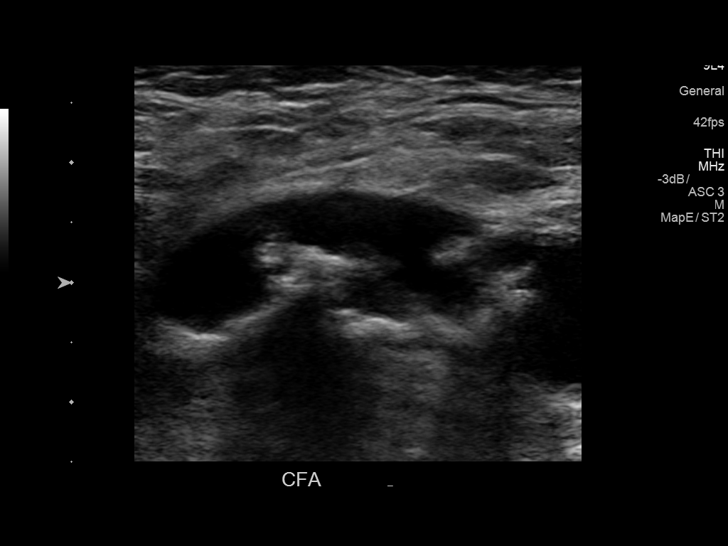
[im 41/45]
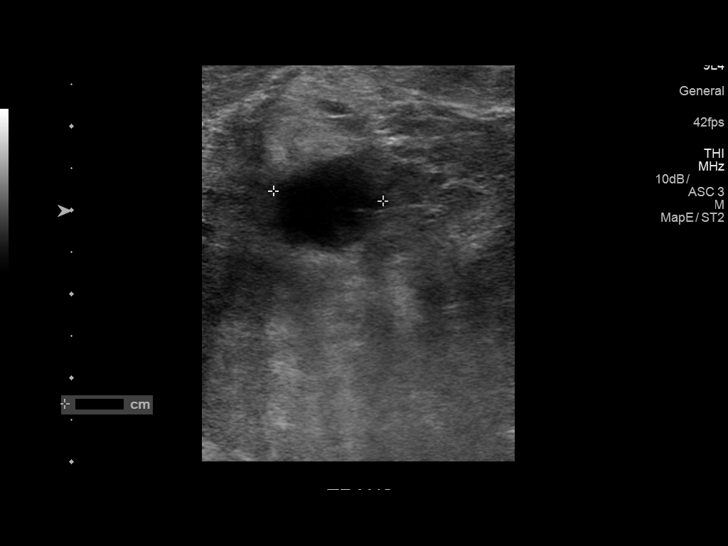
[im 45/45]
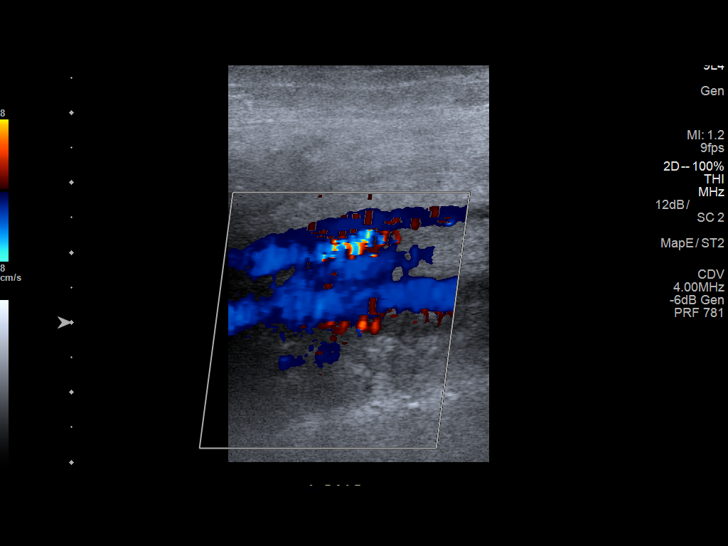

[13 of 24 positions shown; findings below may reference images not displayed]

FINDINGS: Contralateral Common Femoral Vein: Respiratory phasicity is normal
and symmetric with the symptomatic side. No evidence of thrombus.
Normal compressibility.

Common Femoral Vein: No evidence of thrombus. Normal
compressibility, respiratory phasicity and response to augmentation.

Saphenofemoral Junction: No evidence of thrombus. Normal
compressibility and flow on color Doppler imaging.

Profunda Femoral Vein: No evidence of thrombus. Normal
compressibility and flow on color Doppler imaging.

Femoral Vein: No evidence of thrombus. Normal compressibility,
respiratory phasicity and response to augmentation.

Popliteal Vein: No evidence of thrombus. Normal compressibility,
respiratory phasicity and response to augmentation.

Calf Veins: No evidence of thrombus. Normal compressibility and flow
on color Doppler imaging.

Venous Reflux:  None.

Other Findings: 3 cm Baker's cyst is noted in left popliteal fossa.
Incidental note is made of large irregular and partially mobile
plaque in the left common femoral artery resulting in moderate to
severe stenosis.
IMPRESSION: No evidence of DVT within the left lower extremity. 3 cm Baker's
cyst seen in left popliteal fossa.

Large irregular and partially mobile plaque is noted in the left
common femoral artery resulting in moderate to severe stenosis. This
could represent an embolic source into more distal arteries and
vascular surgery consultation is recommended.

## 2018-02-25 ENCOUNTER — Other Ambulatory Visit (INDEPENDENT_AMBULATORY_CARE_PROVIDER_SITE_OTHER): Payer: Self-pay | Admitting: Vascular Surgery

## 2018-02-25 DIAGNOSIS — I739 Peripheral vascular disease, unspecified: Secondary | ICD-10-CM

## 2018-02-25 DIAGNOSIS — I70219 Atherosclerosis of native arteries of extremities with intermittent claudication, unspecified extremity: Secondary | ICD-10-CM

## 2018-03-03 ENCOUNTER — Ambulatory Visit
Admission: RE | Admit: 2018-03-03 | Discharge: 2018-03-03 | Disposition: A | Discharge status: Home / Self Care | Payer: Medicare Other | Source: Ambulatory Visit | Attending: Vascular Surgery | Admitting: Vascular Surgery

## 2018-03-03 ENCOUNTER — Other Ambulatory Visit: Payer: Self-pay | Admitting: Internal Medicine

## 2018-03-03 ENCOUNTER — Ambulatory Visit: Admission: RE | Admit: 2018-03-03 | Payer: Medicare Other | Source: Ambulatory Visit

## 2018-03-03 DIAGNOSIS — I70213 Atherosclerosis of native arteries of extremities with intermittent claudication, bilateral legs: Secondary | ICD-10-CM | POA: Diagnosis not present

## 2018-03-03 DIAGNOSIS — I70219 Atherosclerosis of native arteries of extremities with intermittent claudication, unspecified extremity: Secondary | ICD-10-CM

## 2018-03-03 DIAGNOSIS — I739 Peripheral vascular disease, unspecified: Secondary | ICD-10-CM | POA: Diagnosis not present

## 2018-04-01 ENCOUNTER — Ambulatory Visit (INDEPENDENT_AMBULATORY_CARE_PROVIDER_SITE_OTHER): Payer: Medicare Other

## 2018-04-01 ENCOUNTER — Ambulatory Visit (INDEPENDENT_AMBULATORY_CARE_PROVIDER_SITE_OTHER): Payer: Medicare Other | Admitting: Internal Medicine

## 2018-04-01 VITALS — BP 142/82 | HR 62 | Temp 98.3°F | Ht 66.0 in | Wt 150.0 lb

## 2018-04-01 DIAGNOSIS — M545 Low back pain, unspecified: Secondary | ICD-10-CM

## 2018-04-01 DIAGNOSIS — M47814 Spondylosis without myelopathy or radiculopathy, thoracic region: Secondary | ICD-10-CM | POA: Diagnosis not present

## 2018-04-01 DIAGNOSIS — K3184 Gastroparesis: Secondary | ICD-10-CM

## 2018-04-01 DIAGNOSIS — E119 Type 2 diabetes mellitus without complications: Secondary | ICD-10-CM | POA: Diagnosis not present

## 2018-04-01 DIAGNOSIS — I2510 Atherosclerotic heart disease of native coronary artery without angina pectoris: Secondary | ICD-10-CM | POA: Diagnosis not present

## 2018-04-01 DIAGNOSIS — M549 Dorsalgia, unspecified: Secondary | ICD-10-CM

## 2018-04-01 DIAGNOSIS — I70213 Atherosclerosis of native arteries of extremities with intermittent claudication, bilateral legs: Secondary | ICD-10-CM | POA: Diagnosis not present

## 2018-04-01 DIAGNOSIS — E1143 Type 2 diabetes mellitus with diabetic autonomic (poly)neuropathy: Secondary | ICD-10-CM | POA: Diagnosis not present

## 2018-04-01 DIAGNOSIS — I10 Essential (primary) hypertension: Secondary | ICD-10-CM | POA: Diagnosis not present

## 2018-04-01 DIAGNOSIS — E7800 Pure hypercholesterolemia, unspecified: Secondary | ICD-10-CM

## 2018-04-01 DIAGNOSIS — J479 Bronchiectasis, uncomplicated: Secondary | ICD-10-CM

## 2018-04-01 DIAGNOSIS — R911 Solitary pulmonary nodule: Secondary | ICD-10-CM | POA: Diagnosis not present

## 2018-04-01 DIAGNOSIS — M5136 Other intervertebral disc degeneration, lumbar region: Secondary | ICD-10-CM | POA: Diagnosis not present

## 2018-04-01 DIAGNOSIS — M5134 Other intervertebral disc degeneration, thoracic region: Secondary | ICD-10-CM | POA: Diagnosis not present

## 2018-04-01 NOTE — Progress Notes (Signed)
Patient ID: Debra Kirk, female   DOB: 02-05-1930, 84 y.o.   MRN: [id number]   Subjective:    Patient ID: [name], female    DOB: 12-24-1929, 84 y.o.   MRN: [id number]  HPI  Patient here for a scheduled follow up.  She reports that starting 3-4 weeks ago, she developed mid to lower back pain.  Pain radiates into her right hip.  Taking otc arthritis pain medication.  When lies down - no pain.  AM - hip worse.  Walking helps.  No chest pain.  Breathing stable.  No acid reflux.  No abdominal pain.  Bowels moving.  No urine change.  Blood sugars doing better. States sugars are averaging 150-200.  On Jardiance.     Past Medical History:  Diagnosis Date  . Carotid artery disease ([place])   . Compression fracture    s/p fosamax, prolia  . Diabetes mellitus ([place])   . Femoral neuropathy    left, elevated CRP, ESR, negative FANA and ANCA  . Fibrocystic breast disease   . Hypercholesterolemia   . Hypertension    Past Surgical History:  Procedure Laterality Date  . APPENDECTOMY    . CHOLECYSTECTOMY    . HERNIA REPAIR    . INGUINAL HERNIA REPAIR     left x 1, right x 2  . OVARIAN CYST REMOVAL    . PARTIAL HYSTERECTOMY     Family History  Problem Relation Age of Onset  . Heart disease Father        myocardial infarction  . Heart disease Mother        myocardial infarction  . Diabetes Mother   . Congestive Heart Failure Mother   . Bone cancer Sister   . Diabetes Sister        x2  . Ovarian cancer Sister 66  . Heart disease Sister        CABG  . Rheum arthritis Sister   . Breast cancer Neg Hx   . Colon cancer Neg Hx    Social History   Socioeconomic History  . Marital status: Widowed    Spouse name: Not on file  . Number of children: 3  . Years of education: Not on file  . Highest education level: Not on file  Occupational History  . Not on file  Social Needs  . Financial resource strain: Not on file  . Food insecurity:    Worry: Not on file    Inability:  Not on file  . Transportation needs:    Medical: Not on file    Non-medical: Not on file  Tobacco Use  . Smoking status: Former Smoker    Last attempt to quit: 08/20/1958    Years since quitting: 59.6  . Smokeless tobacco: Never Used  Substance and Sexual Activity  . Alcohol use: No    Alcohol/week: 0.0 standard drinks  . Drug use: No  . Sexual activity: Never  Lifestyle  . Physical activity:    Days per week: Not on file    Minutes per session: Not on file  . Stress: Not on file  Relationships  . Social connections:    Talks on phone: Not on file    Gets together: Not on file    Attends religious service: Not on file    Active member of club or organization: Not on file    Attends meetings of clubs or organizations: Not on file    Relationship status: Not on file  Other Topics Concern  . Not on file  Social History Narrative  . Not on file    Outpatient Encounter Medications as of 04/01/2018  Medication Sig  . AMBULATORY NON FORMULARY MEDICATION Medication Name: Incentive Spirometry Use 10-15 times daily.  Marland Kitchen amLODipine (NORVASC) 5 MG tablet TAKE 1 TABLET BY MOUTH DAILY  . aspirin 81 MG tablet Take 81 mg by mouth daily.  . Calcium Carbonate-Vitamin D (CALCIUM 600+D) 600-400 MG-UNIT per tablet Take 1 tablet by mouth 2 (two) times daily.  Marland Kitchen denosumab (PROLIA) 60 MG/ML SOLN injection Inject 60 mg into the skin every 6 (six) months. Administer in upper arm, thigh, or abdomen  . glimepiride (AMARYL) 2 MG tablet TAKE 1 TABLET (2 MG TOTAL) BY MOUTH 2 (TWO) TIMES DAILY.  Marland Kitchen glucose blood (BAYER CONTOUR TEST) test strip CHECK DAILY AND AS NEEDED **E11.9**  . JARDIANCE 10 MG TABS tablet TAKE 1 TABLET BY MOUTH DAILY.  . meclizine (ANTIVERT) 12.5 MG tablet Take 1 tablet (12.5 mg total) by mouth 2 (two) times daily as needed for dizziness.  . metoCLOPramide (REGLAN) 5 MG tablet Take 5 mg by mouth 2 (two) times daily.   . metoprolol tartrate (LOPRESSOR) 50 MG tablet TAKE 1 AND 1/2 TABLET  BY MOUTH TWICE A DAY  . omeprazole (PRILOSEC) 20 MG capsule Take 1 capsule (20 mg total) by mouth 2 (two) times daily.  Marland Kitchen Respiratory Therapy Supplies (FLUTTER) DEVI Use 10-15 times daily  . rosuvastatin (CRESTOR) 10 MG tablet Take 1 tablet (10 mg total) by mouth daily.  . rosuvastatin (CRESTOR) 5 MG tablet TAKE 1 TABLET (5 MG TOTAL) BY MOUTH DAILY.  Marland Kitchen albuterol (PROVENTIL HFA;VENTOLIN HFA) 108 (90 Base) MCG/ACT inhaler Inhale 2 puffs into the lungs every 6 (six) hours as needed for wheezing or shortness of breath. (Patient not taking: Reported on 04/01/2018)   No facility-administered encounter medications on file as of 04/01/2018.     Review of Systems  Constitutional: Negative for appetite change and unexpected weight change.  HENT: Negative for congestion and sinus pressure.   Respiratory: Negative for cough, chest tightness and shortness of breath.   Cardiovascular: Negative for chest pain, palpitations and leg swelling.  Gastrointestinal: Negative for abdominal pain, diarrhea, nausea and vomiting.  Genitourinary: Negative for difficulty urinating and dysuria.  Musculoskeletal: Negative for myalgias.       Back pain as outlined.  Pain into right hip.    Skin: Negative for color change and rash.  Neurological: Negative for dizziness, light-headedness and headaches.  Psychiatric/Behavioral: Negative for agitation and dysphoric mood.       Objective:     Blood pressure rechecked by me:  138/78  Physical Exam  Constitutional: She appears well-developed and well-nourished. No distress.  HENT:  Nose: Nose normal.  Mouth/Throat: Oropharynx is clear and moist.  Neck: Neck supple. No thyromegaly present.  Cardiovascular: Normal rate and regular rhythm.  Pulmonary/Chest: Breath sounds normal. No respiratory distress. She has no wheezes.  Abdominal: Soft. Bowel sounds are normal. There is no tenderness.  Musculoskeletal: She exhibits no edema or tenderness.  Lymphadenopathy:    She  has no cervical adenopathy.  Skin: No rash noted. No erythema.  Psychiatric: She has a normal mood and affect. Her behavior is normal.    BP (!) 142/82   Pulse 62   Temp 98.3 F (36.8 C) (Oral)   Ht 5' 6"  (1.676 m)   Wt 150 lb (68 kg)   SpO2 97%   BMI 24.21 kg/m  Wt Readings from Last 3 Encounters:  04/01/18 150 lb (68 kg)  12/10/17 152 lb (68.9 kg)  10/22/17 153 lb (69.4 kg)     Lab Results  Component Value Date   WBC 5.5 04/02/2018   HGB 14.2 04/02/2018   HCT 42.0 04/02/2018   PLT 218.0 04/02/2018   GLUCOSE 194 (H) 04/03/2018   CHOL 185 04/03/2018   TRIG 145.0 04/03/2018   HDL 49.30 04/03/2018   LDLCALC 106 (H) 04/03/2018   ALT 12 04/03/2018   AST 14 04/03/2018   NA 138 04/03/2018   K 4.0 04/03/2018   CL 101 04/03/2018   CREATININE 0.96 04/03/2018   BUN 18 04/03/2018   CO2 24 04/03/2018   TSH 1.72 05/29/2017   HGBA1C 7.4 (H) 04/03/2018   MICROALBUR 4.1 (H) 05/29/2017    US Arterial Abi (screening Lower Extremity)  Result Date: 03/03/2018 CLINICAL DATA:  84 year old female with peripheral vascular disease and claudication EXAM: BILATERAL LOWER EXTREMITY ARTERIAL DUPLEX SCAN TECHNIQUE: Gray-scale sonography as well as color Doppler and duplex ultrasound was performed to evaluate the arteries of both lower extremities including the common, superficial and profunda femoral arteries, popliteal artery and calf arteries. COMPARISON:  Prior duplex venous ultrasound 01/18/2017 FINDINGS: Right Lower Extremity ABI: 1.2 Inflow: Bulky calcified atherosclerotic plaque along the posterior wall of the common femoral artery. Normal common femoral arterial waveforms and velocities. No evidence of inflow (aortoiliac) disease. Outflow: Normal profunda femoral, superficial femoral and popliteal arterial waveforms and velocities. No focal elevation of the PSV to suggest stenosis. Runoff: Normal posterior and anterior tibial arterial waveforms and velocities. Vessels are patent to the  ankle. Left Lower Extremity ABI: 1.3 Inflow: Bulky calcified atherosclerotic plaque along the posterior wall of the common femoral artery. Normal common femoral arterial waveforms and velocities. No evidence of inflow (aortoiliac) disease. Outflow: Normal profunda femoral, superficial femoral and popliteal arterial waveforms and velocities. No focal elevation of the PSV to suggest stenosis. Runoff: Normal posterior and anterior tibial arterial waveforms and velocities. Vessels are patent to the ankle. IMPRESSION: 1. Normal resting bilateral ankle-brachial indices. 2. Bulky heterogeneous calcified atherosclerotic plaque along the posterior walls of the bilateral common femoral arteries. No evidence of hemodynamically significant stenosis or occlusion. Signed, Criselda Peaches, MD [facility] Radiology Electronically Signed   By: Jacqulynn Cadet M.D.   On: 03/03/2018 16:41   [place] Lower Ext Art Bilat  Result Date: 03/03/2018 CLINICAL DATA:  84 year old female with peripheral vascular disease and claudication EXAM: BILATERAL LOWER EXTREMITY ARTERIAL DUPLEX SCAN TECHNIQUE: Gray-scale sonography as well as color Doppler and duplex ultrasound was performed to evaluate the arteries of both lower extremities including the common, superficial and profunda femoral arteries, popliteal artery and calf arteries. COMPARISON:  Prior duplex venous ultrasound 01/18/2017 FINDINGS: Right Lower Extremity ABI: 1.2 Inflow: Bulky calcified atherosclerotic plaque along the posterior wall of the common femoral artery. Normal common femoral arterial waveforms and velocities. No evidence of inflow (aortoiliac) disease. Outflow: Normal profunda femoral, superficial femoral and popliteal arterial waveforms and velocities. No focal elevation of the PSV to suggest stenosis. Runoff: Normal posterior and anterior tibial arterial waveforms and velocities. Vessels are patent to the ankle. Left  Lower Extremity ABI: 1.3 Inflow: Bulky calcified atherosclerotic plaque along the posterior wall of the common femoral artery. Normal common femoral arterial waveforms and velocities. No evidence of inflow (aortoiliac) disease. Outflow: Normal profunda femoral, superficial femoral and popliteal arterial waveforms and velocities. No focal elevation of the PSV to suggest  stenosis. Runoff: Normal posterior and anterior tibial arterial waveforms and velocities. Vessels are patent to the ankle. IMPRESSION: 1. Normal resting bilateral ankle-brachial indices. 2. Bulky heterogeneous calcified atherosclerotic plaque along the posterior walls of the bilateral common femoral arteries. No evidence of hemodynamically significant stenosis or occlusion. Signed, Criselda Peaches, MD [facility] Radiology Electronically Signed   By: Jacqulynn Cadet M.D.   On: 03/03/2018 16:41       Assessment & Plan:   Problem List Items Addressed This Visit    Atherosclerosis of native arteries of extremity with intermittent claudication ([place])    Continue risk factor modification.  Saw Dr Delana Meyer.  Stable.        Bronchiectasis without complication ([place])    Evaluated by Dr Mortimer Fries and has seen Dr Alva Garnet.  Recommended f/u in one year.        CAD (coronary artery disease)    Stable.  Continue risk factor modification.        Diabetes mellitus type 2, uncomplicated (HCC)    Sugars better. On Jardiance.  Discussed the need to stop jardiance if sick.  Stay hydrated.  Low carb diet.  Follow met b and a1c.        Relevant Orders   Hemoglobin A1c (Completed)   Basic metabolic panel (Completed)   Gastroparesis due to DM ([place])    Followed by GI.  Stable.        Hypercholesterolemia    On crestor.  Low cholesterol diet and exercise.  Follow lipid panel and liver function tests.        Relevant Orders   Hepatic function panel (Completed)   Lipid panel (Completed)    Hypertension    Blood pressure under good control.  Continue same medication regimen.  Follow pressures.  Follow metabolic panel.        Lung nodule    Last CT 05/2017.  Recommended f/u CT chest in one year.         Other Visit Diagnoses    Mid back pain    -  Primary   Persistent pain.  May need xray.     Relevant Orders   DG Lumbar Spine 2-3 Views (Completed)   DG Thoracic Spine 2 View (Completed)   Midline low back pain without sciatica, unspecified chronicity       Persistent pain.  Check xray.     Relevant Orders   DG Lumbar Spine 2-3 Views (Completed)   DG Thoracic Spine 2 View (Completed)       Einar Pheasant, MD

## 2018-04-02 ENCOUNTER — Other Ambulatory Visit (INDEPENDENT_AMBULATORY_CARE_PROVIDER_SITE_OTHER): Payer: Medicare Other

## 2018-04-02 ENCOUNTER — Encounter: Payer: Self-pay | Admitting: *Deleted

## 2018-04-02 DIAGNOSIS — R413 Other amnesia: Secondary | ICD-10-CM

## 2018-04-02 DIAGNOSIS — D649 Anemia, unspecified: Secondary | ICD-10-CM

## 2018-04-02 LAB — CBC WITH DIFFERENTIAL/PLATELET
Basophils Absolute: 0 10*3/uL (ref 0.0–0.1)
Basophils Relative: 0.9 % (ref 0.0–3.0)
Eosinophils Absolute: 0 10*3/uL (ref 0.0–0.7)
Eosinophils Relative: 0 % (ref 0.0–5.0)
HCT: 42 % (ref 36.0–46.0)
Hemoglobin: 14.2 g/dL (ref 12.0–15.0)
Lymphocytes Relative: 35.3 % (ref 12.0–46.0)
Lymphs Abs: 2 10*3/uL (ref 0.7–4.0)
MCHC: 33.9 g/dL (ref 30.0–36.0)
MCV: 86.7 fl (ref 78.0–100.0)
Monocytes Absolute: 0.5 10*3/uL (ref 0.1–1.0)
Monocytes Relative: 8.9 % (ref 3.0–12.0)
Neutro Abs: 3 10*3/uL (ref 1.4–7.7)
Neutrophils Relative %: 54.9 % (ref 43.0–77.0)
Platelets: 218 10*3/uL (ref 150.0–400.0)
RBC: 4.85 Mil/uL (ref 3.87–5.11)
RDW: 14.9 % (ref 11.5–15.5)
WBC: 5.5 10*3/uL (ref 4.0–10.5)

## 2018-04-02 LAB — IBC PANEL
Iron: 76 ug/dL (ref 42–145)
Saturation Ratios: 20.3 % (ref 20.0–50.0)
Transferrin: 267 mg/dL (ref 212.0–360.0)

## 2018-04-02 LAB — VITAMIN B12: Vitamin B-12: 365 pg/mL (ref 211–911)

## 2018-04-02 LAB — FERRITIN: Ferritin: 18.7 ng/mL (ref 10.0–291.0)

## 2018-04-03 ENCOUNTER — Other Ambulatory Visit (INDEPENDENT_AMBULATORY_CARE_PROVIDER_SITE_OTHER): Payer: Medicare Other

## 2018-04-03 DIAGNOSIS — E7800 Pure hypercholesterolemia, unspecified: Secondary | ICD-10-CM | POA: Diagnosis not present

## 2018-04-03 DIAGNOSIS — E119 Type 2 diabetes mellitus without complications: Secondary | ICD-10-CM | POA: Diagnosis not present

## 2018-04-03 LAB — BASIC METABOLIC PANEL
BUN: 18 mg/dL (ref 6–23)
CHLORIDE: 101 meq/L (ref 96–112)
CO2: 24 meq/L (ref 19–32)
Calcium: 9.5 mg/dL (ref 8.4–10.5)
Creatinine, Ser: 0.96 mg/dL (ref 0.40–1.20)
GFR: 58.51 mL/min — ABNORMAL LOW (ref >60.00)
GLUCOSE: 194 mg/dL — AB (ref 70–99)
POTASSIUM: 4 meq/L (ref 3.5–5.1)
Sodium: 138 mEq/L (ref 135–145)

## 2018-04-03 LAB — LIPID PANEL
CHOL/HDL RATIO: 4
Cholesterol: 185 mg/dL (ref 0–200)
HDL: 49.3 mg/dL (ref >39.00)
LDL CALC: 106 mg/dL — AB (ref 0–99)
NONHDL: 135.46
Triglycerides: 145 mg/dL (ref 0.0–149.0)
VLDL: 29 mg/dL (ref 0.0–40.0)

## 2018-04-03 LAB — HEPATIC FUNCTION PANEL
ALBUMIN: 4.2 g/dL (ref 3.5–5.2)
ALT: 12 U/L (ref 0–35)
AST: 14 U/L (ref 0–37)
Alkaline Phosphatase: 46 U/L (ref 39–117)
Bilirubin, Direct: 0.1 mg/dL (ref 0.0–0.3)
Total Bilirubin: 0.6 mg/dL (ref 0.2–1.2)
Total Protein: 7.3 g/dL (ref 6.0–8.3)

## 2018-04-03 LAB — HEMOGLOBIN A1C: HEMOGLOBIN A1C: 7.4 % — AB (ref 4.6–6.5)

## 2018-04-06 ENCOUNTER — Encounter: Payer: Self-pay | Admitting: Internal Medicine

## 2018-04-06 NOTE — Assessment & Plan Note (Signed)
Blood pressure under good control.  Continue same medication regimen.  Follow pressures.  Follow metabolic panel.   

## 2018-04-06 NOTE — Assessment & Plan Note (Signed)
Last CT 05/2017.  Recommended f/u CT chest in one year.

## 2018-04-06 NOTE — Assessment & Plan Note (Signed)
On crestor.  Low cholesterol diet and exercise.  Follow lipid panel and liver function tests.   

## 2018-04-06 NOTE — Assessment & Plan Note (Signed)
Sugars better. On Jardiance.  Discussed the need to stop jardiance if sick.  Stay hydrated.  Low carb diet.  Follow met b and a1c.

## 2018-04-06 NOTE — Assessment & Plan Note (Signed)
Followed by GI.  Stable.   

## 2018-04-06 NOTE — Assessment & Plan Note (Signed)
Stable. Continue risk factor modification.  

## 2018-04-06 NOTE — Assessment & Plan Note (Signed)
Continue risk factor modification.  Saw Dr Delana Meyer.  Stable.

## 2018-04-06 NOTE — Assessment & Plan Note (Signed)
Evaluated by Dr Mortimer Fries and has seen Dr Alva Garnet.  Recommended f/u in one year.

## 2018-04-18 ENCOUNTER — Other Ambulatory Visit: Payer: Self-pay | Admitting: Internal Medicine

## 2018-04-28 ENCOUNTER — Other Ambulatory Visit: Payer: Self-pay | Admitting: Internal Medicine

## 2018-05-23 ENCOUNTER — Other Ambulatory Visit: Payer: Self-pay | Admitting: Internal Medicine

## 2018-06-12 DIAGNOSIS — Z23 Encounter for immunization: Secondary | ICD-10-CM | POA: Diagnosis not present

## 2018-07-09 ENCOUNTER — Other Ambulatory Visit: Payer: Self-pay

## 2018-07-15 ENCOUNTER — Ambulatory Visit (INDEPENDENT_AMBULATORY_CARE_PROVIDER_SITE_OTHER): Payer: Medicare Other | Admitting: Internal Medicine

## 2018-07-15 DIAGNOSIS — Z0000 Encounter for general adult medical examination without abnormal findings: Secondary | ICD-10-CM

## 2018-07-15 DIAGNOSIS — I2510 Atherosclerotic heart disease of native coronary artery without angina pectoris: Secondary | ICD-10-CM | POA: Diagnosis not present

## 2018-07-15 DIAGNOSIS — E7800 Pure hypercholesterolemia, unspecified: Secondary | ICD-10-CM | POA: Diagnosis not present

## 2018-07-15 DIAGNOSIS — I70213 Atherosclerosis of native arteries of extremities with intermittent claudication, bilateral legs: Secondary | ICD-10-CM

## 2018-07-15 DIAGNOSIS — E119 Type 2 diabetes mellitus without complications: Secondary | ICD-10-CM | POA: Diagnosis not present

## 2018-07-15 DIAGNOSIS — R911 Solitary pulmonary nodule: Secondary | ICD-10-CM | POA: Diagnosis not present

## 2018-07-15 DIAGNOSIS — K3184 Gastroparesis: Secondary | ICD-10-CM

## 2018-07-15 DIAGNOSIS — E1143 Type 2 diabetes mellitus with diabetic autonomic (poly)neuropathy: Secondary | ICD-10-CM

## 2018-07-15 DIAGNOSIS — J479 Bronchiectasis, uncomplicated: Secondary | ICD-10-CM

## 2018-07-15 DIAGNOSIS — I10 Essential (primary) hypertension: Secondary | ICD-10-CM

## 2018-07-15 LAB — HM DIABETES FOOT EXAM

## 2018-07-15 MED ORDER — ROSUVASTATIN CALCIUM 10 MG PO TABS: 10.0000 mg | ORAL_TABLET | Freq: Every day | ORAL | 3 refills | Status: DC

## 2018-07-15 NOTE — Progress Notes (Addendum)
Patient ID: Debra Kirk, female   DOB: [birth year], 84 y.o.   MRN: [id number]   Subjective:    Patient ID: Debra Kirk, female    DOB: [birth year], 84 y.o.   MRN: [id number]  HPI  Patient here for a scheduled follow up.  She reports she is doing relatively well.  Feels better.  No chest pain.  No sob.  No acid reflux.  No abdominal pain.  Bowels moving.  States eye check - up to date.  Discussed shingrx.  States sugars doing better.  Brought in no recorded sugar readings.  No cough or congestion.  Eating well.     Past Medical History:  Diagnosis Date  . Carotid artery disease ([place])   . Compression fracture    s/p fosamax, prolia  . Diabetes mellitus ([place])   . Femoral neuropathy    left, elevated CRP, ESR, negative FANA and ANCA  . Fibrocystic breast disease   . Hypercholesterolemia   . Hypertension    Past Surgical History:  Procedure Laterality Date  . APPENDECTOMY    . CHOLECYSTECTOMY    . HERNIA REPAIR    . INGUINAL HERNIA REPAIR     left x 1, right x 2  . OVARIAN CYST REMOVAL    . PARTIAL HYSTERECTOMY     Family History  Problem Relation Age of Onset  . Heart disease Father        myocardial infarction  . Heart disease Mother        myocardial infarction  . Diabetes Mother   . Congestive Heart Failure Mother   . Bone cancer Sister   . Diabetes Sister        x2  . Ovarian cancer Sister 47  . Heart disease Sister        CABG  . Rheum arthritis Sister   . Breast cancer Neg Hx   . Colon cancer Neg Hx    Social History   Socioeconomic History  . Marital status: Widowed    Spouse name: Not on file  . Number of children: 3  . Years of education: Not on file  . Highest education level: Not on file  Occupational History  . Not on file  Social Needs  . Financial resource strain: Not on file  . Food insecurity:    Worry: Not on file    Inability: Not on file  . Transportation needs:    Medical: Not on file    Non-medical: Not on file  Tobacco Use    . Smoking status: Former Smoker    Last attempt to quit: 08/20/1958    Years since quitting: 59.9  . Smokeless tobacco: Never Used  Substance and Sexual Activity  . Alcohol use: No    Alcohol/week: 0.0 standard drinks  . Drug use: No  . Sexual activity: Never  Lifestyle  . Physical activity:    Days per week: Not on file    Minutes per session: Not on file  . Stress: Not on file  Relationships  . Social connections:    Talks on phone: Not on file    Gets together: Not on file    Attends religious service: Not on file    Active member of club or organization: Not on file    Attends meetings of clubs or organizations: Not on file    Relationship status: Not on file  Other Topics Concern  . Not on file  Social History Narrative  . Not on file  Outpatient Encounter Medications as of 07/15/2018  Medication Sig  . albuterol (PROVENTIL HFA;VENTOLIN HFA) 108 (90 Base) MCG/ACT inhaler Inhale 2 puffs into the lungs every 6 (six) hours as needed for wheezing or shortness of breath. (Patient not taking: Reported on 04/01/2018)  . AMBULATORY NON FORMULARY MEDICATION Medication Name: Incentive Spirometry Use 10-15 times daily.  Marland Kitchen amLODipine (NORVASC) 5 MG tablet TAKE 1 TABLET BY MOUTH DAILY  . aspirin 81 MG tablet Take 81 mg by mouth daily.  . Calcium Carbonate-Vitamin D (CALCIUM 600+D) 600-400 MG-UNIT per tablet Take 1 tablet by mouth 2 (two) times daily.  Marland Kitchen denosumab (PROLIA) 60 MG/ML SOLN injection Inject 60 mg into the skin every 6 (six) months. Administer in upper arm, thigh, or abdomen  . glimepiride (AMARYL) 2 MG tablet TAKE 1 TABLET (2 MG TOTAL) BY MOUTH 2 (TWO) TIMES DAILY.  Marland Kitchen glucose blood (BAYER CONTOUR TEST) test strip CHECK DAILY AND AS NEEDED **E11.9**  . JARDIANCE 10 MG TABS tablet TAKE 1 TABLET BY MOUTH DAILY.  . meclizine (ANTIVERT) 12.5 MG tablet Take 1 tablet (12.5 mg total) by mouth 2 (two) times daily as needed for dizziness.  . metoCLOPramide (REGLAN) 5 MG tablet  Take 5 mg by mouth 2 (two) times daily.   . metoprolol tartrate (LOPRESSOR) 50 MG tablet TAKE 1 AND 1/2 TABLET BY MOUTH TWICE A DAY  . omeprazole (PRILOSEC) 20 MG capsule Take 1 capsule (20 mg total) by mouth 2 (two) times daily.  Marland Kitchen Respiratory Therapy Supplies (FLUTTER) DEVI Use 10-15 times daily  . rosuvastatin (CRESTOR) 10 MG tablet Take 1 tablet (10 mg total) by mouth daily.  . [DISCONTINUED] rosuvastatin (CRESTOR) 10 MG tablet Take 1 tablet (10 mg total) by mouth daily.  . [DISCONTINUED] rosuvastatin (CRESTOR) 5 MG tablet TAKE 1 TABLET (5 MG TOTAL) BY MOUTH DAILY.   No facility-administered encounter medications on file as of 07/15/2018.     Review of Systems  Constitutional: Negative for appetite change and unexpected weight change.  HENT: Negative for congestion and sinus pressure.   Respiratory: Negative for cough, chest tightness and shortness of breath.   Cardiovascular: Negative for chest pain, palpitations and leg swelling.  Gastrointestinal: Negative for abdominal pain, diarrhea, nausea and vomiting.  Genitourinary: Negative for difficulty urinating and dysuria.  Musculoskeletal: Negative for joint swelling and myalgias.  Skin: Negative for color change and rash.  Neurological: Negative for dizziness, light-headedness and headaches.  Psychiatric/Behavioral: Negative for agitation and dysphoric mood.       Objective:    Physical Exam  Constitutional: She appears well-developed and well-nourished. No distress.  HENT:  Nose: Nose normal.  Mouth/Throat: Oropharynx is clear and moist.  Neck: Neck supple. No thyromegaly present.  Cardiovascular: Normal rate and regular rhythm.  Pulmonary/Chest: Breath sounds normal. No respiratory distress. She has no wheezes.  Abdominal: Soft. Bowel sounds are normal. There is no tenderness.  Musculoskeletal: She exhibits no edema or tenderness.  Feet:  No lesions.  DP pulses palpable and equal bilaterally.  Sensation intact to light  touch and pin prick.    Lymphadenopathy:    She has no cervical adenopathy.  Skin: No rash noted. No erythema.  Psychiatric: She has a normal mood and affect. Her behavior is normal.    BP 136/72 (BP Location: Left Arm, Patient Position: Sitting, Cuff Size: Normal)   Pulse (!) 57   Temp 97.9 F (36.6 C) (Oral)   Resp 18   Ht 5' 6"  (1.676 m)   Wt 151 lb  9.6 oz (68.8 kg)   SpO2 96%   BMI 24.47 kg/m  Wt Readings from Last 3 Encounters:  07/15/18 151 lb 9.6 oz (68.8 kg)  04/01/18 150 lb (68 kg)  12/10/17 152 lb (68.9 kg)     Lab Results  Component Value Date   WBC 5.5 04/02/2018   HGB 14.2 04/02/2018   HCT 42.0 04/02/2018   PLT 218.0 04/02/2018   GLUCOSE 194 (H) 04/03/2018   CHOL 185 04/03/2018   TRIG 145.0 04/03/2018   HDL 49.30 04/03/2018   LDLCALC 106 (H) 04/03/2018   ALT 12 04/03/2018   AST 14 04/03/2018   NA 138 04/03/2018   K 4.0 04/03/2018   CL 101 04/03/2018   CREATININE 0.96 04/03/2018   BUN 18 04/03/2018   CO2 24 04/03/2018   TSH 1.72 05/29/2017   HGBA1C 7.4 (H) 04/03/2018   MICROALBUR 4.1 (H) 05/29/2017    US Arterial Abi (screening Lower Extremity)  Result Date: 03/03/2018 CLINICAL DATA:  84 year old female with peripheral vascular disease and claudication EXAM: BILATERAL LOWER EXTREMITY ARTERIAL DUPLEX SCAN TECHNIQUE: Gray-scale sonography as well as color Doppler and duplex ultrasound was performed to evaluate the arteries of both lower extremities including the common, superficial and profunda femoral arteries, popliteal artery and calf arteries. COMPARISON:  Prior duplex venous ultrasound 01/18/2017 FINDINGS: Right Lower Extremity ABI: 1.2 Inflow: Bulky calcified atherosclerotic plaque along the posterior wall of the common femoral artery. Normal common femoral arterial waveforms and velocities. No evidence of inflow (aortoiliac) disease. Outflow: Normal profunda femoral, superficial femoral and popliteal arterial waveforms and velocities. No focal  elevation of the PSV to suggest stenosis. Runoff: Normal posterior and anterior tibial arterial waveforms and velocities. Vessels are patent to the ankle. Left Lower Extremity ABI: 1.3 Inflow: Bulky calcified atherosclerotic plaque along the posterior wall of the common femoral artery. Normal common femoral arterial waveforms and velocities. No evidence of inflow (aortoiliac) disease. Outflow: Normal profunda femoral, superficial femoral and popliteal arterial waveforms and velocities. No focal elevation of the PSV to suggest stenosis. Runoff: Normal posterior and anterior tibial arterial waveforms and velocities. Vessels are patent to the ankle. IMPRESSION: 1. Normal resting bilateral ankle-brachial indices. 2. Bulky heterogeneous calcified atherosclerotic plaque along the posterior walls of the bilateral common femoral arteries. No evidence of hemodynamically significant stenosis or occlusion. Signed, Criselda Peaches, MD [facility] Radiology Electronically Signed   By: Jacqulynn Cadet M.D.   On: 03/03/2018 16:41   [place] Lower Ext Art Bilat  Result Date: 03/03/2018 CLINICAL DATA:  84 year old female with peripheral vascular disease and claudication EXAM: BILATERAL LOWER EXTREMITY ARTERIAL DUPLEX SCAN TECHNIQUE: Gray-scale sonography as well as color Doppler and duplex ultrasound was performed to evaluate the arteries of both lower extremities including the common, superficial and profunda femoral arteries, popliteal artery and calf arteries. COMPARISON:  Prior duplex venous ultrasound 01/18/2017 FINDINGS: Right Lower Extremity ABI: 1.2 Inflow: Bulky calcified atherosclerotic plaque along the posterior wall of the common femoral artery. Normal common femoral arterial waveforms and velocities. No evidence of inflow (aortoiliac) disease. Outflow: Normal profunda femoral, superficial femoral and popliteal arterial waveforms and velocities. No focal elevation of  the PSV to suggest stenosis. Runoff: Normal posterior and anterior tibial arterial waveforms and velocities. Vessels are patent to the ankle. Left Lower Extremity ABI: 1.3 Inflow: Bulky calcified atherosclerotic plaque along the posterior wall of the common femoral artery. Normal common femoral arterial waveforms and velocities. No evidence of inflow (aortoiliac) disease. Outflow: Normal profunda femoral,  superficial femoral and popliteal arterial waveforms and velocities. No focal elevation of the PSV to suggest stenosis. Runoff: Normal posterior and anterior tibial arterial waveforms and velocities. Vessels are patent to the ankle. IMPRESSION: 1. Normal resting bilateral ankle-brachial indices. 2. Bulky heterogeneous calcified atherosclerotic plaque along the posterior walls of the bilateral common femoral arteries. No evidence of hemodynamically significant stenosis or occlusion. Signed, Criselda Peaches, MD [facility] Radiology Electronically Signed   By: Jacqulynn Cadet M.D.   On: 03/03/2018 16:41       Assessment & Plan:   Problem List Items Addressed This Visit    Atherosclerosis of native arteries of extremity with intermittent claudication (HCC)    Saw Dr Delana Meyer.  Stable.  Continue risk factor modification.        Bronchiectasis without complication ([place])    Evaluated by Dr Mortimer Fries and has seen Dr Alva Garnet.  Breathing stable.  Follow.        CAD (coronary artery disease)    Stable.  Continue risk factor modification.        Relevant Medications   rosuvastatin (CRESTOR) 10 MG tablet   Diabetes mellitus type 2, uncomplicated (HCC)    Low carb diet and exercise.  Sugars have been doing better - per her report.  Low carb diet and exercise.  Follow met b and a1c.        Relevant Medications   rosuvastatin (CRESTOR) 10 MG tablet   Other Relevant Orders   Hemoglobin V9T   Basic metabolic panel   Microalbumin / creatinine urine ratio    Gastroparesis due to DM ([place])    Has been followed by GI.  Stable.        Relevant Medications   rosuvastatin (CRESTOR) 10 MG tablet   Health care maintenance    Physical today 07/15/18.  Mammogram 06/12/17 - Birads II.  Declines further mammograms.  Colonoscopy 20/2880.  84 years old now.  Hold colon screening.        Hypercholesterolemia    On crestor.  Low cholesterol diet and exercise.  Follow lipid panel and liver function tests.        Relevant Medications   rosuvastatin (CRESTOR) 10 MG tablet   Other Relevant Orders   Hepatic function panel   Lipid panel   TSH   Hypertension    Blood pressure under good control.  Continue same medication regimen.  Follow pressures.  Follow metabolic panel.        Relevant Medications   rosuvastatin (CRESTOR) 10 MG tablet   Lung nodule    Last CT 05/2017.  Recommended f/u CT chest in one year.  Need to schedule.            Einar Pheasant, MD

## 2018-07-16 ENCOUNTER — Other Ambulatory Visit: Payer: Self-pay | Admitting: Internal Medicine

## 2018-07-19 ENCOUNTER — Encounter: Payer: Self-pay | Admitting: Internal Medicine

## 2018-07-19 NOTE — Assessment & Plan Note (Signed)
Blood pressure under good control.  Continue same medication regimen.  Follow pressures.  Follow metabolic panel.   

## 2018-07-19 NOTE — Assessment & Plan Note (Signed)
On crestor.  Low cholesterol diet and exercise.  Follow lipid panel and liver function tests.   

## 2018-07-19 NOTE — Assessment & Plan Note (Signed)
Stable. Continue risk factor modification.  

## 2018-07-19 NOTE — Assessment & Plan Note (Signed)
Evaluated by Dr Mortimer Fries and has seen Dr Alva Garnet.  Breathing stable.  Follow.

## 2018-07-19 NOTE — Assessment & Plan Note (Signed)
Last CT 05/2017.  Recommended f/u CT chest in one year.  Need to schedule.

## 2018-07-19 NOTE — Assessment & Plan Note (Signed)
Saw Dr Delana Meyer.  Stable.  Continue risk factor modification.

## 2018-07-19 NOTE — Assessment & Plan Note (Signed)
Physical today 07/15/18.  Mammogram 06/12/17 - Birads II.  Declines further mammograms.  Colonoscopy 22/6565.  84 years old now.  Hold colon screening.

## 2018-07-19 NOTE — Assessment & Plan Note (Signed)
Low carb diet and exercise.  Sugars have been doing better - per her report.  Low carb diet and exercise.  Follow met b and a1c.

## 2018-07-19 NOTE — Assessment & Plan Note (Signed)
Has been followed by GI.  Stable.

## 2018-07-21 ENCOUNTER — Other Ambulatory Visit: Payer: Self-pay | Admitting: Internal Medicine

## 2018-07-21 ENCOUNTER — Telehealth: Payer: Self-pay | Admitting: Internal Medicine

## 2018-07-21 DIAGNOSIS — R918 Other nonspecific abnormal finding of lung field: Secondary | ICD-10-CM

## 2018-07-21 NOTE — Telephone Encounter (Signed)
-----   Message from Nanci Pina, [place] sent at 60/0/4599  8:42 AM EST ----- Regarding: RE: CT chest Patient said she had forgotten but is willing to have CT to please schedule. ----- Message ----- From: Einar Pheasant, MD Sent: 07/19/2018   7:08 PM EST To: Nanci Pina, LPN Subject: CT chest                                       Pt was here recently for physical.  Last year, she had CT chest and they recommended a f/u chest CT (to confirm a small nodule stable).  We had discussed this last year.  Due f/u CT chest.  (last 05/2017).  If agreeable, I would like to schedule her for CT chest.  Let me know either way if agreeable.  Thanks    Dr Nicki Reaper

## 2018-07-21 NOTE — Progress Notes (Signed)
Order placed for f/u chest CT

## 2018-07-21 NOTE — Telephone Encounter (Signed)
Order placed for chest CT 

## 2018-07-24 ENCOUNTER — Other Ambulatory Visit: Payer: Self-pay | Admitting: Internal Medicine

## 2018-07-25 ENCOUNTER — Other Ambulatory Visit: Payer: Self-pay | Admitting: Internal Medicine

## 2018-07-30 ENCOUNTER — Other Ambulatory Visit (INDEPENDENT_AMBULATORY_CARE_PROVIDER_SITE_OTHER): Payer: Medicare Other

## 2018-07-30 DIAGNOSIS — E7800 Pure hypercholesterolemia, unspecified: Secondary | ICD-10-CM

## 2018-07-30 DIAGNOSIS — E119 Type 2 diabetes mellitus without complications: Secondary | ICD-10-CM

## 2018-07-30 LAB — TSH: TSH: 1.44 u[IU]/mL (ref 0.35–4.50)

## 2018-07-30 LAB — LIPID PANEL
CHOL/HDL RATIO: 3
Cholesterol: 164 mg/dL (ref 0–200)
HDL: 50.4 mg/dL (ref >39.00)
LDL Cholesterol: 90 mg/dL (ref 0–99)
NonHDL: 113.7
Triglycerides: 117 mg/dL (ref 0.0–149.0)
VLDL: 23.4 mg/dL (ref 0.0–40.0)

## 2018-07-30 LAB — MICROALBUMIN / CREATININE URINE RATIO
Creatinine,U: 107.6 mg/dL
Microalb Creat Ratio: 3.6 mg/g (ref 0.0–30.0)
Microalb, Ur: 3.9 mg/dL — ABNORMAL HIGH (ref 0.0–1.9)

## 2018-07-30 LAB — HEPATIC FUNCTION PANEL
ALT: 11 U/L (ref 0–35)
AST: 15 U/L (ref 0–37)
Albumin: 4.2 g/dL (ref 3.5–5.2)
Alkaline Phosphatase: 47 U/L (ref 39–117)
BILIRUBIN DIRECT: 0.2 mg/dL (ref 0.0–0.3)
Total Bilirubin: 0.7 mg/dL (ref 0.2–1.2)
Total Protein: 7.4 g/dL (ref 6.0–8.3)

## 2018-07-30 LAB — BASIC METABOLIC PANEL
BUN: 16 mg/dL (ref 6–23)
CO2: 29 mEq/L (ref 19–32)
CREATININE: 0.85 mg/dL (ref 0.40–1.20)
Calcium: 9.5 mg/dL (ref 8.4–10.5)
Chloride: 99 mEq/L (ref 96–112)
GFR: 67.28 mL/min (ref >60.00)
Glucose, Bld: 196 mg/dL — ABNORMAL HIGH (ref 70–99)
Potassium: 3.6 mEq/L (ref 3.5–5.1)
Sodium: 137 mEq/L (ref 135–145)

## 2018-07-30 LAB — HEMOGLOBIN A1C: Hgb A1c MFr Bld: 7.4 % — ABNORMAL HIGH (ref 4.6–6.5)

## 2018-08-01 ENCOUNTER — Other Ambulatory Visit: Payer: Self-pay | Admitting: Internal Medicine

## 2018-08-06 DIAGNOSIS — M8000XD Age-related osteoporosis with current pathological fracture, unspecified site, subsequent encounter for fracture with routine healing: Secondary | ICD-10-CM | POA: Diagnosis not present

## 2018-08-06 DIAGNOSIS — M810 Age-related osteoporosis without current pathological fracture: Secondary | ICD-10-CM | POA: Diagnosis not present

## 2018-08-14 ENCOUNTER — Ambulatory Visit: Payer: Self-pay | Admitting: *Deleted

## 2018-08-14 MED ORDER — OSELTAMIVIR PHOSPHATE 30 MG PO CAPS: 30.0000 mg | ORAL_CAPSULE | Freq: Every day | ORAL | 0 refills | Status: DC

## 2018-08-14 NOTE — Telephone Encounter (Signed)
Sent to [place] to advise.   Thanks

## 2018-08-14 NOTE — Telephone Encounter (Signed)
Pt's daughter, Jerolyn Center; she states that the pt's great grandchildren were diagnosed with the flu; they had fevers when she was with them on 08/13/18; recommendations made per nurse triage protocol; she uses [facility], [place]; Mendy's best contact number is [phone], and a detailed message can be left at this number; the pt is normally seen by Dr Einar Pheasant, [place]; explained that this request could take 48-72 hours; she verbalized understanding; will route to office for final disposition.   Reason for Disposition . [1] Influenza EXPOSURE (Close Contact) within last 48 hours (2 days) AND [2] exposed person is HIGH RISK (e.g., age > 60 years, pregnant, HIV+, chronic medical condition)  Answer Assessment - Initial Assessment Questions 1. TYPE of EXPOSURE: "How were you exposed?" (e.g., close contact, not a close contact)     Close contact; grandchildren 08/13/18 2. DATE of EXPOSURE: "When did the exposure occur?" (e.g., hour, days, weeks)     08/13/18 3. PREGNANCY: "Is there any chance you are pregnant?" "When was your last menstrual period?"     no 4. HIGH RISK for COMPLICATIONS: "Do you have any heart or lung problems? Do you have a weakened immune system?" (e.g., CHF, COPD, asthma, HIV positive, chemotherapy, renal failure, diabetes mellitus, sickle cell anemia)     diabetes 5. SYMPTOMS: "Do you have any symptoms?" (e.g., cough, fever, sore throat, difficulty breathing).     no  Protocols used: INFLUENZA EXPOSURE-A-AH

## 2018-08-14 NOTE — Telephone Encounter (Signed)
Please let the patient or her daughter know that I sent Tamiflu in for her to start on for prophylaxis.  Given her age she is at high risk.  She should understand that the medication may be somewhat expensive and there is a risk of psychiatric issues with taking this medication and if she develops any psychiatric symptoms such as confusion, drowsiness, hallucinations, or any other symptoms she should be evaluated.

## 2018-08-14 NOTE — Addendum Note (Signed)
Addended by: Leone Haven on: 08/14/2018 04:51 PM   Modules accepted: Orders

## 2018-08-14 NOTE — Telephone Encounter (Signed)
Called and spoke with patient's daughter Mendy. Daughter advised and voiced understanding.

## 2018-08-18 ENCOUNTER — Ambulatory Visit
Admission: RE | Admit: 2018-08-18 | Discharge: 2018-08-18 | Disposition: A | Discharge status: Home / Self Care | Payer: Medicare Other | Source: Ambulatory Visit | Attending: Internal Medicine | Admitting: Internal Medicine

## 2018-08-18 DIAGNOSIS — R918 Other nonspecific abnormal finding of lung field: Secondary | ICD-10-CM | POA: Diagnosis not present

## 2018-08-18 DIAGNOSIS — R911 Solitary pulmonary nodule: Secondary | ICD-10-CM | POA: Diagnosis not present

## 2018-08-21 ENCOUNTER — Ambulatory Visit: Payer: Self-pay

## 2018-08-21 NOTE — Telephone Encounter (Signed)
FYI

## 2018-08-21 NOTE — Telephone Encounter (Signed)
Patient was up all night long coughing. Not very productive. She does have some drainage. She is feeling better as the day went on. She felt bad when she got up this morning. She is currently on tamiflu due to exposure and has 3 doses left. She has not had any sx like fever, chills, body aches, etc. She can not remember what the OTC medication is that the triage nurse told her to take. Patient is wondering what she can take or anything that you recommend

## 2018-08-21 NOTE — Telephone Encounter (Signed)
Patient called in with c/o "cold symptoms." She says "I have a cough, dry, hacking cough that started last Friday with a dry, tickle throat. Sunday, my nose started running clear. Now, nothing is draining out, it's clear and not stopped up. My breathing is fine, no fever. I just feel like it's cold in my throat. I feel a little drainage to the back of my throat. I just don't want this to go into pneumonia. The last time I had a cold, I ended up with pneumonia. Dr. Nicki Reaper told me anytime I got a cold, to call and she would call me in something." I asked about other symptoms, she says "I have a dry throat and I sound hoarse. I had a earache last Friday when the dry throat started, but now it's not hurting nor is my throat soar." According to protocol, home care advice given, patient verbalized understanding. I advised I will send this to Dr. Nicki Reaper for review and someone will call if she has any other recommendations.  Reason for Disposition . Care advice for mild cough, questions about  Answer Assessment - Initial Assessment Questions 1. ONSET: "When did the nasal discharge start?"      Sunday 2. AMOUNT: "How much discharge is there?"      Nothing is draining out, not stopped up 3. COUGH: "Do you have a cough?" If yes, ask: "Describe the color of your sputum" (clear, white, yellow, green)    Yes, dry, hacking cough 4. RESPIRATORY DISTRESS: "Describe your breathing."      Breathing is ok 5. FEVER: "Do you have a fever?" If so, ask: "What is your temperature, how was it measured, and when did it start?"     No 6. SEVERITY: "Overall, how bad are you feeling right now?" (e.g., doesn't interfere with normal activities, staying home from school/work, staying in bed)      Feeling a little better than I did when I got up. Did not sleep much last night because of the coughing 7. OTHER SYMPTOMS: "Do you have any other symptoms?" (e.g., sore throat, earache, wheezing, vomiting)     Hoarse sounding, dry  throat 8. PREGNANCY: "Is there any chance you are pregnant?" "When was your last menstrual period?"    No  Protocols used: COMMON COLD-A-AH

## 2018-08-21 NOTE — Telephone Encounter (Signed)
Patient aware and will try these

## 2018-08-21 NOTE — Telephone Encounter (Signed)
Can take robitussin DM twice a day as needed for cough and congestion.  If having drainage, can use nasacort nasal spray - 2 sprays each nostril one time per day.  Do this in the evening.  Will need to be evaluated for persistent cough.

## 2018-09-10 DIAGNOSIS — M8000XD Age-related osteoporosis with current pathological fracture, unspecified site, subsequent encounter for fracture with routine healing: Secondary | ICD-10-CM | POA: Diagnosis not present

## 2018-09-27 ENCOUNTER — Other Ambulatory Visit: Payer: Self-pay | Admitting: Internal Medicine

## 2018-10-28 IMAGING — CR DG CHEST 2V
1 series · 2 of 2 positions shown · non-contrast
Comparison: 03/23/2015

CLINICAL DATA: Shortness of breath.

EXAM:
CHEST  2 VIEW

[Series 1: dg chest 2 view · 0.14mm/px · 2 of 2 slices shown]
[im 1/2]
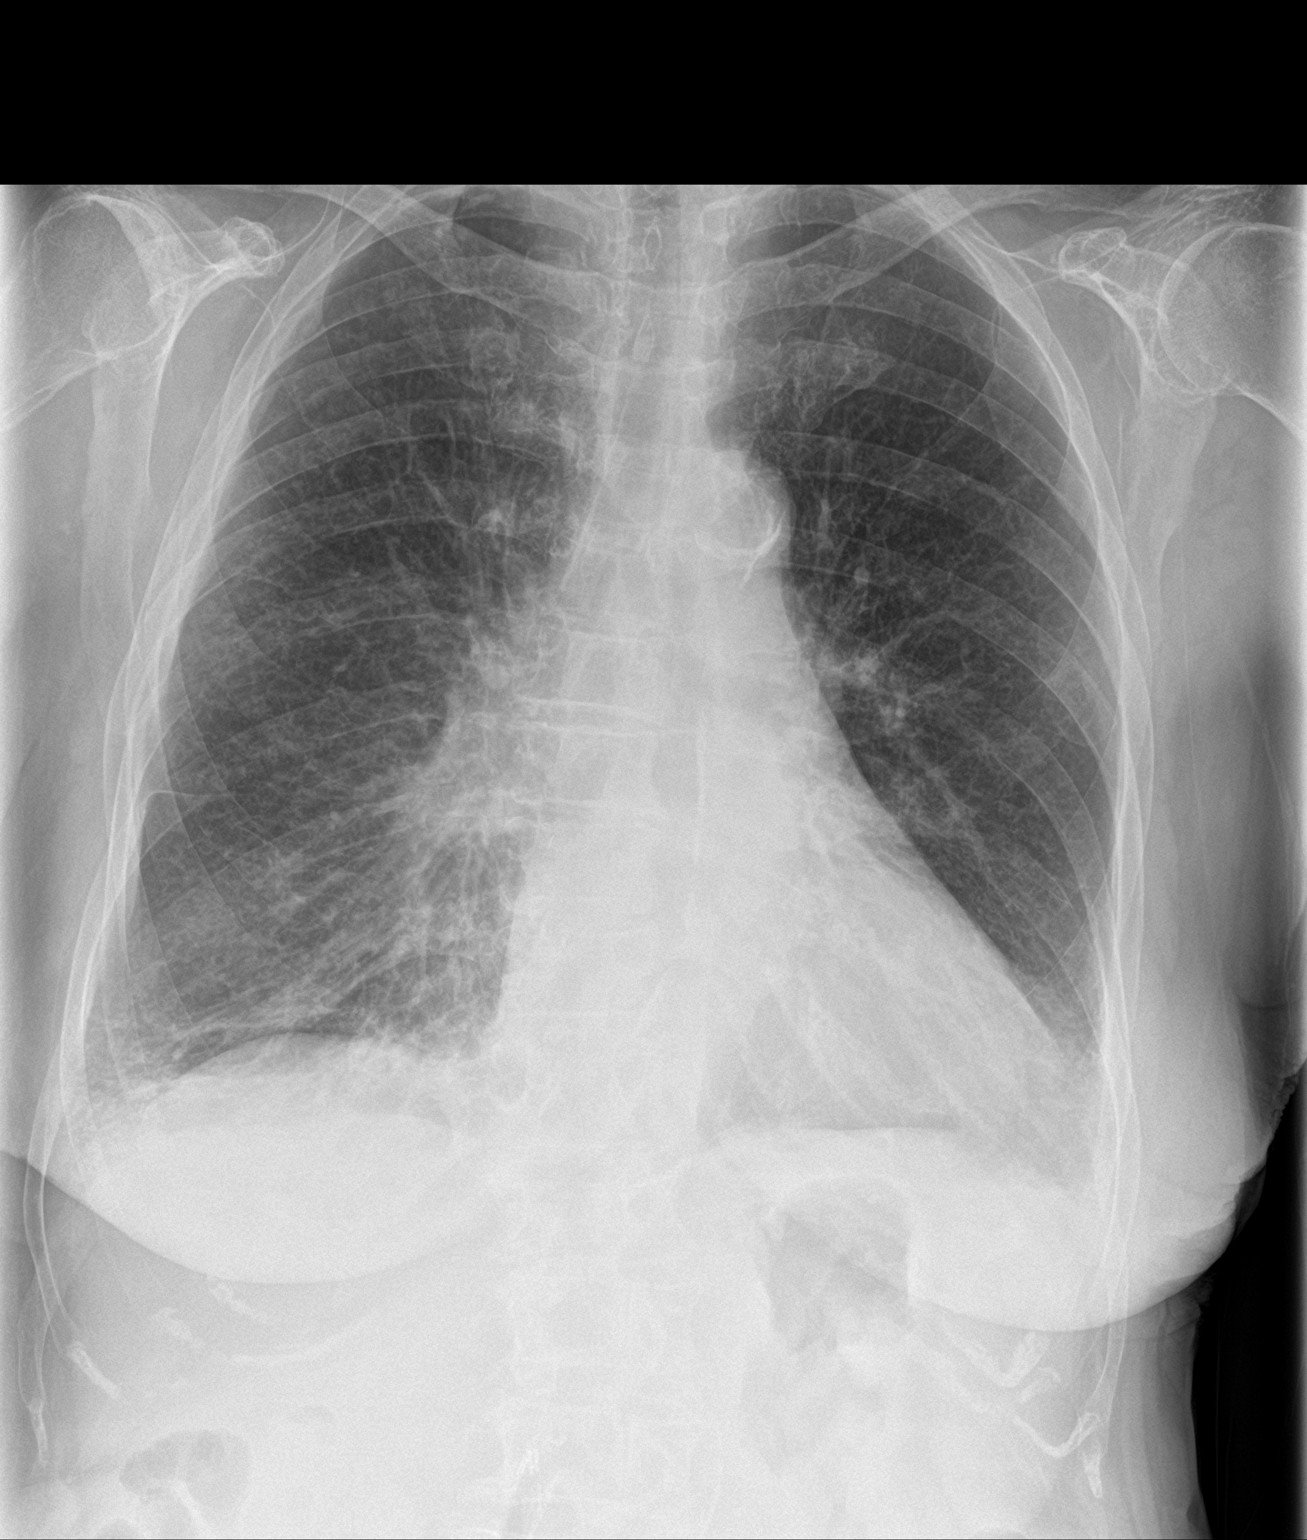
[im 2/2]
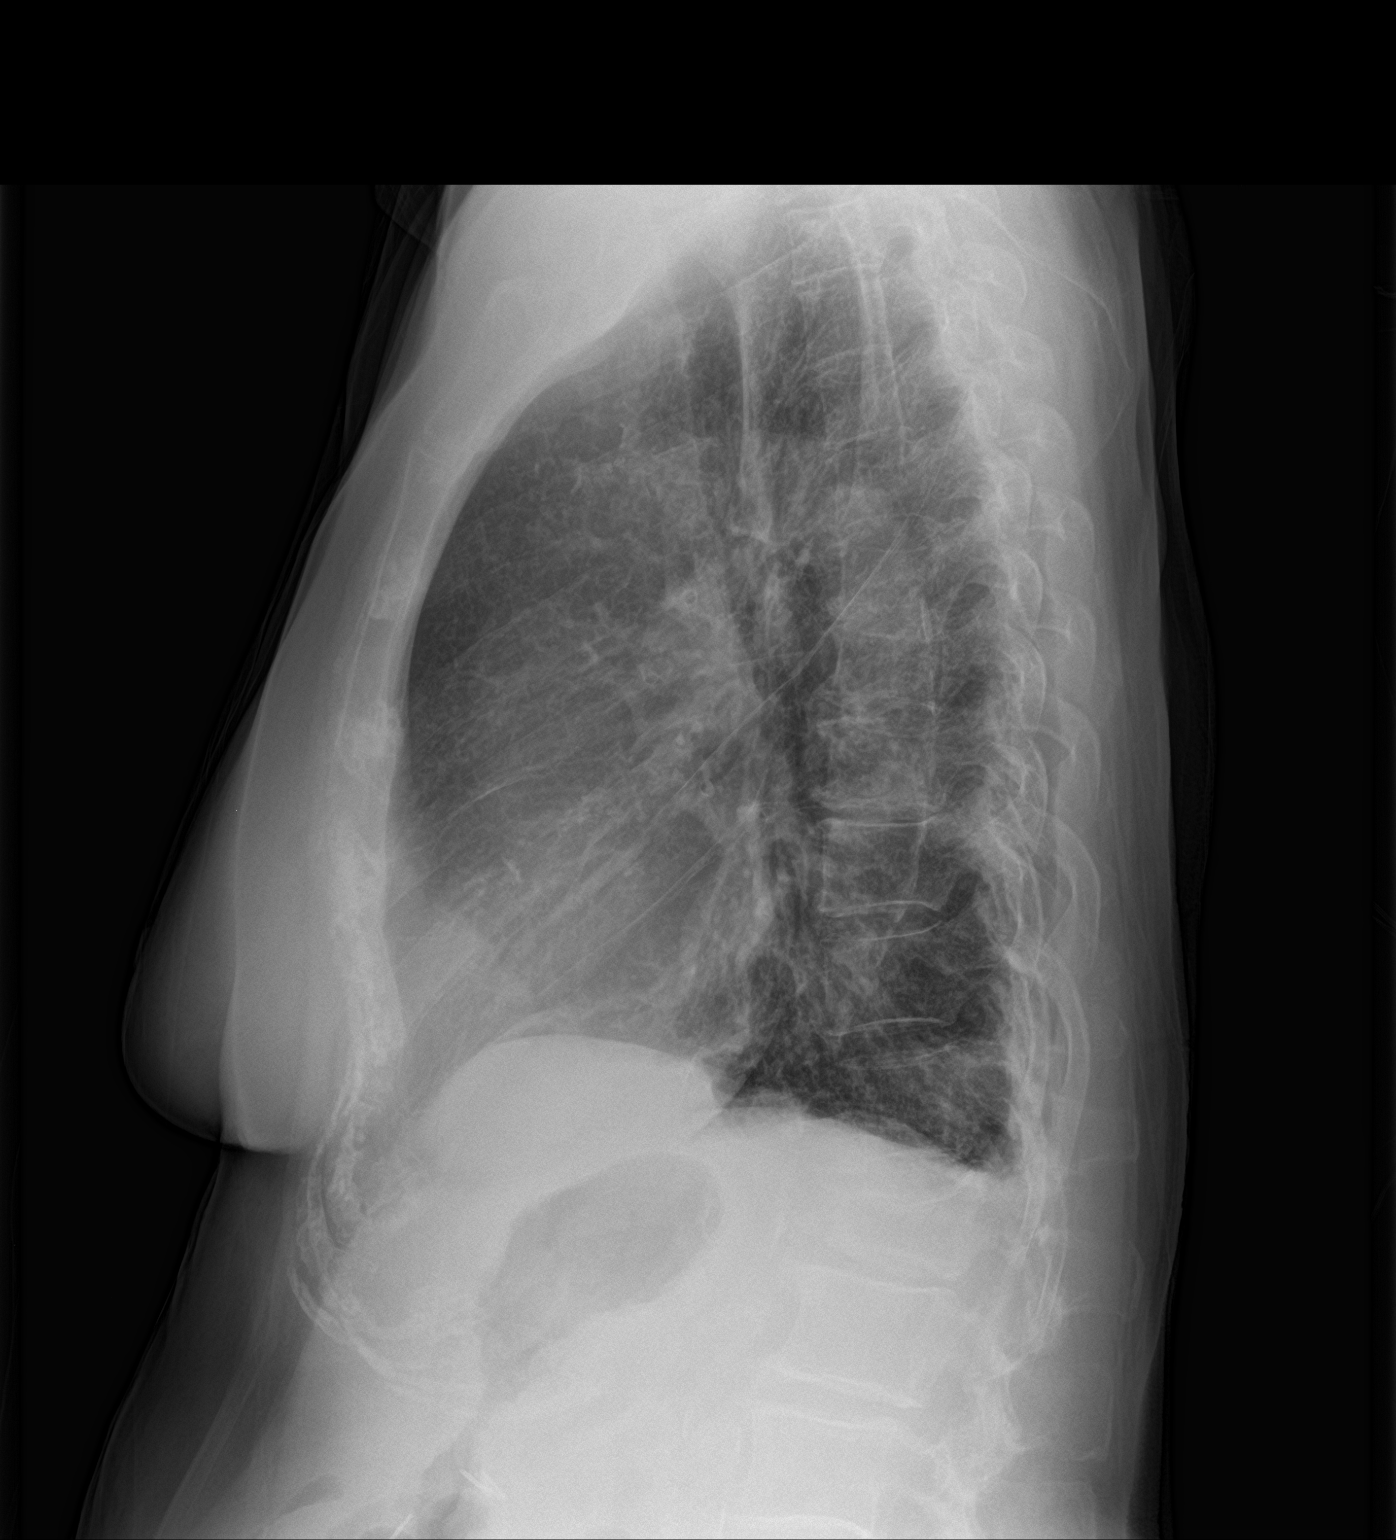

[2 of 2 positions shown; findings below may reference images not displayed]

FINDINGS: There is asymmetric reticular density at the right base. Fullness of
the right hilum which could reflect underlying adenopathy or mass.
Background hyperinflation, suspected emphysema. Borderline
cardiomegaly. Remote L1 compression fracture.
IMPRESSION: Infection and/or atelectasis at the right base. Prominent right
hilum which could reflect underlying mass or adenopathy. Recommend
chest CT follow-up.

## 2018-10-31 IMAGING — CT CT ANGIO AOBIFEM WO/W CM
2 of 10 series · 10 of 33 positions shown · IV contrast (APPLIED)
Comparison: CT abdomen and pelvis - 03/27/2011 ;

CLINICAL DATA: Incidental finding of atherosclerotic plaque
involving the left femoral artery on DVT ultrasound. Evaluate for
PAD.

EXAM:
CT ANGIOGRAPHY OF ABDOMINAL AORTA WITH ILIOFEMORAL RUNOFF
TECHNIQUE: Multidetector CT imaging of the abdomen, pelvis and lower
extremities was performed using the standard protocol during bolus
administration of intravenous contrast. Multiplanar CT image
reconstructions and MIPs were obtained to evaluate the vascular
anatomy.
CONTRAST:  125 cc Isovue 370

[Series 4: axial arterial upper · axial · arterial · 0.86mm/px · z∈[+91,+706]mm · 6 of 289 slices shown]
[im 42/289  soft-tissue]
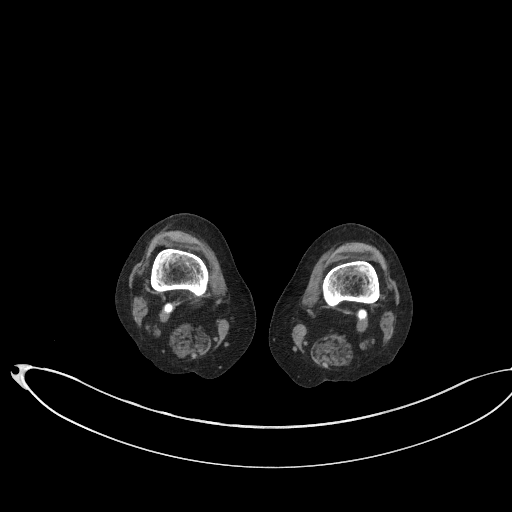
[im 83/289  bone]
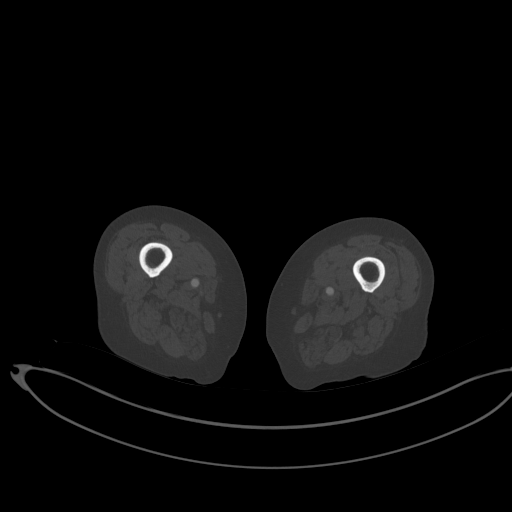
[im 124/289  soft-tissue]
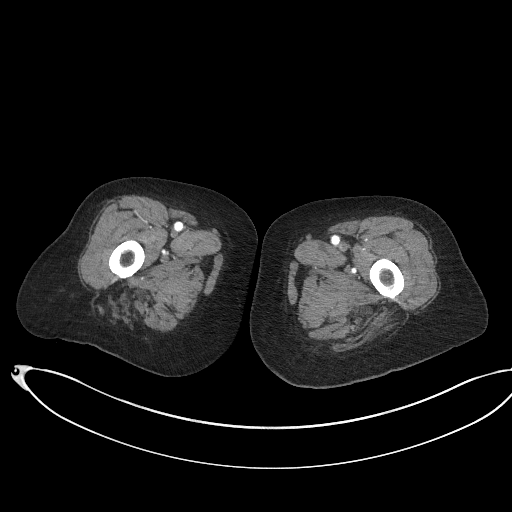
[im 165/289  bone]
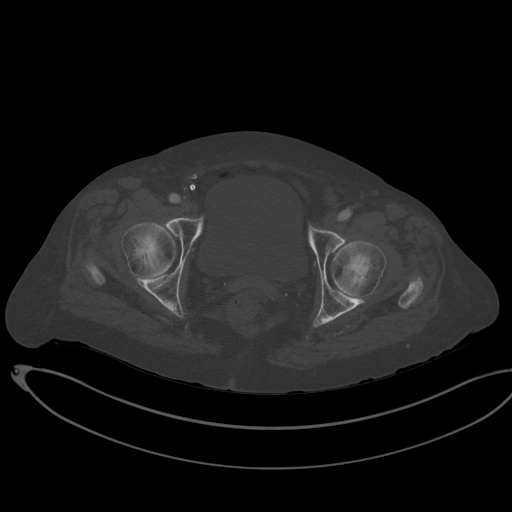
[im 206/289  soft-tissue]
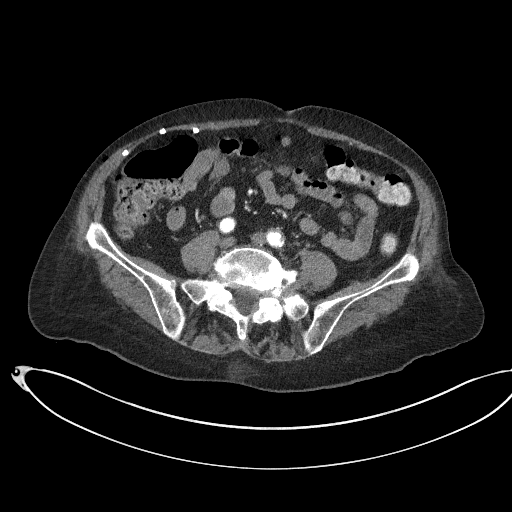
[im 247/289  bone]
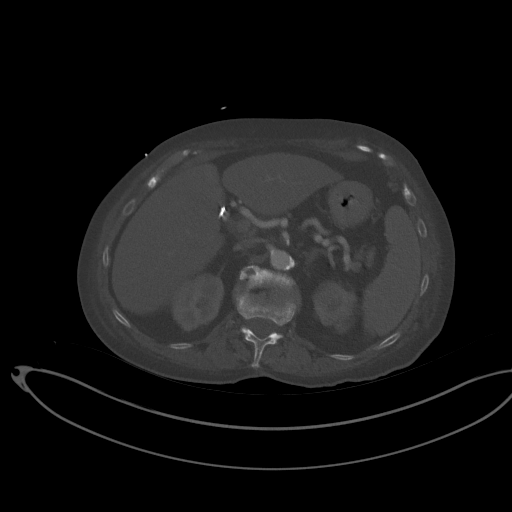

[Series 10: axial arterial lower · axial · arterial · 0.77mm/px · z∈[-391,+2]mm · 4 of 219 slices shown]
[im 44/219  soft-tissue]
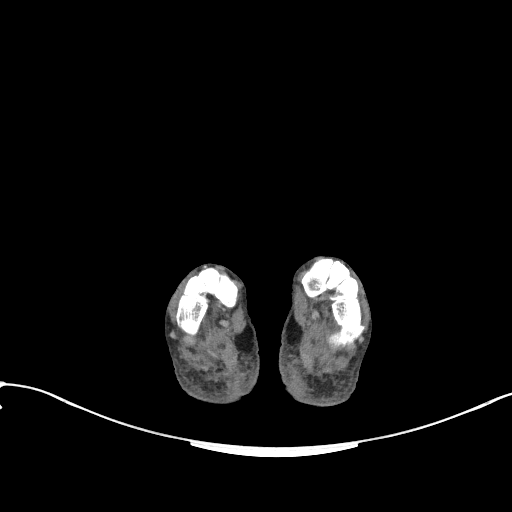
[im 88/219  soft-tissue]
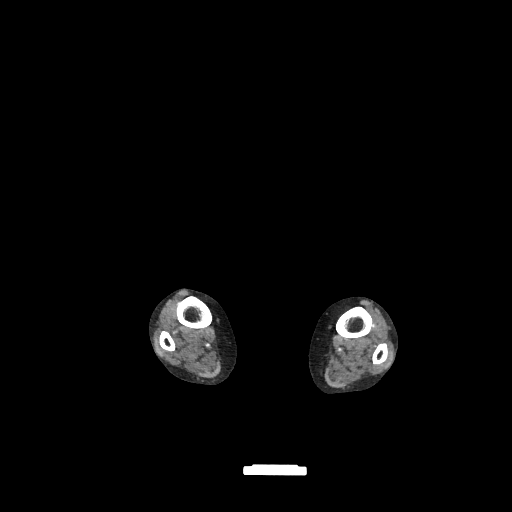
[im 131/219  soft-tissue]
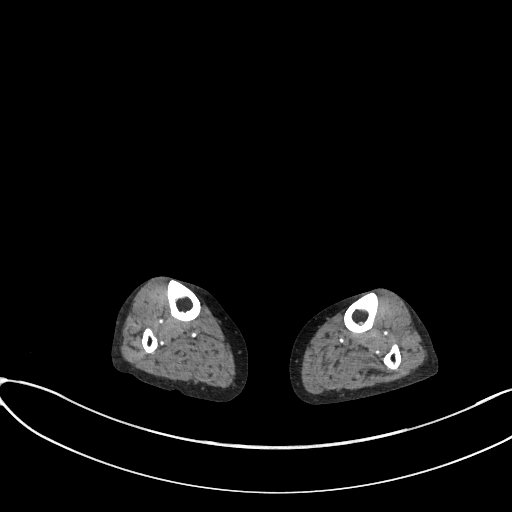
[im 175/219  soft-tissue]
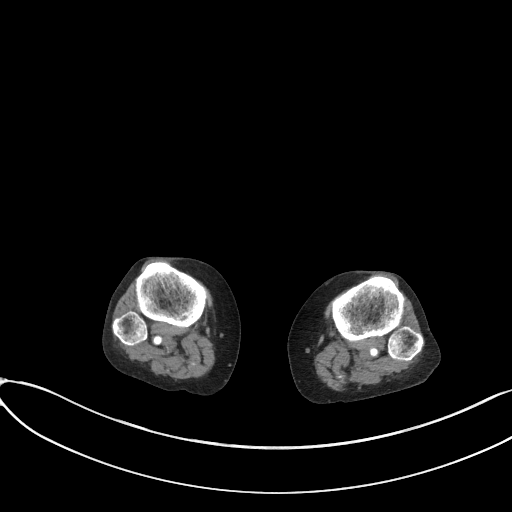

[10 of 33 positions shown; findings below may reference images not displayed]

left lower
extremity venous Doppler ultrasound - earlier same day ; chest CT
-01/15/2017
FINDINGS: VASCULAR

Aorta: Moderate amount of eccentric mixed calcified and noncalcified
atherosclerotic plaque within a tortuous but normal caliber
abdominal aorta, not resulting in hemodynamically significant
stenosis. No abdominal aortic dissection or periaortic stranding.

Celiac: There is a minimal amount of eccentric mixed calcified and
noncalcified atherosclerotic plaque involving the cranial aspect of
the origin the celiac artery, not resulting in hemodynamically
significant stenosis. Conventional branching pattern.

SMA: There is eccentric mixed calcified and noncalcified
atherosclerotic plaque involving the origin and proximal aspect of
the SMA which approaches approximately 50% luminal narrowing
(representative axial image 42, series 4; coronal image 98, series
6). Conventional branching pattern. The distal tributaries of the
SMA are widely patent without discrete intraluminal filling defect
suggest distal embolism.

Renals: Solitary bilaterally there is a minimal amount of eccentric
mixed calcified and noncalcified atherosclerotic plaque involving
the origin of the bilateral renal arteries, not definitely resulting
in a hemodynamically significant stenosis.

IMA: Remains patent.

RIGHT Lower Extremity

Inflow: The right common, external and internal iliac arteries are
mildly diseased and tortuous though patent without hemodynamically
significant stenosis and of normal caliber.

Outflow: There is a moderate to large amount of eccentric mixed
calcified and noncalcified atherosclerotic plaque within the right
common femoral artery (representative images 127 and 131, series 4)
which is approaches approximately 50% luminal narrowing.

The right superficial and deep femoral arteries are widely patent
without hemodynamically significant stenosis.

Both the right above and below-knee popliteal arteries are widely
patent without hemodynamically significant stenosis.

Runoff: Three-vessel runoff to the right lower leg and foot. The
right-sided dorsalis pedis artery is patent to the level of the
forefoot.

LEFT Lower Extremity

Inflow: The left common, external and internal iliac arteries are
mildly diseased and tortuous though patent without hemodynamically
significant stenosis and of normal caliber.

Outflow: There is a large amount of eccentric mixed calcified and
noncalcified atherosclerotic plaque within the left common femoral
artery (representative images 132 and 136, series 4), compatible
with the findings seen on preceding DVT ultrasound and likely
resulting in at least 50% luminal narrowing.

The left superficial and deep femoral arteries are widely patent.

Both the left above and below-knee popliteal arteries are widely
patent.

Runoff: Three-vessel runoff to the left lower leg and foot. The
left-sided dorsalis pedis artery is patent to the level of the
forefoot.

Veins: The IVC and pelvic venous system appears widely patent on
this arterial phase examination.

Review of the MIP images confirms the above findings.

NON-VASCULAR

Evaluation of abdominal organs is limited to the arterial phase of
enhancement.

Lower chest: Limited visualization of lower thorax demonstrates
consolidative opacities within in the posteromedial and lateral
basilar aspects of the right lower lobe with associated air
bronchograms, progressed compared to the 01/15/2017 examination. No
pleural effusion.

Cardiomegaly. Coronary artery calcifications. No pericardial
effusion.

Hepatobiliary: Normal hepatic contour. No discrete hyperenhancing
hepatic lesions. Post cholecystectomy. No intra extrahepatic bili
duct dilatation. No ascites.

Pancreas: Normal appearance of the pancreas

Spleen: Normal appearance of the spleen.

Adrenals/Urinary Tract: There is symmetric enhancement of the
bilateral kidneys. No definite renal stones this postcontrast
examination. No discrete renal lesions. No urine obstruction. There
is a minimal amount of grossly symmetric bilateral perinephric
stranding, likely age and body habitus related. No urine
obstruction.

Normal appearance the bilateral adrenal glands.

Normal appearance of the urinary bladder given degree distention.

Stomach/Bowel: Rather extensive colonic diverticulosis without
evidence of diverticulitis. Normal appearance of the terminal ileum.
The appendix is not visualized, however there is no pericecal
inflammatory change. No pneumoperitoneum, pneumatosis or portal
venous gas.

Lymphatic: No bulky retroperitoneal, mesenteric, pelvic or inguinal
lymphadenopathy.

Reproductive: Post hysterectomy. No discrete adnexal lesion. No free
fluid the pelvic cul-de-sac.

Other: Post right lower ventral wall hernia repair without evidence
of recurrence.

Musculoskeletal: No acute or aggressive osseous abnormalities. Old
moderate (approximately 40%) compression deformity involving the
superior endplate of the L1 vertebral body, similar to the [DATE]
exam. Moderate severe multilevel lumbar spine DDD, worse at L4-L5
and L5-S1 with disc space height loss, endplate irregularity and
sclerosis. Bone islands are incidentally noted within the left
acetabulum and right femoral head.
IMPRESSION: VASCULAR

1. Moderate amount of mixed calcified and noncalcified
atherosclerotic plaque within a normal caliber abdominal aorta.
Aortic Atherosclerosis ([id number]).
2. Suspected hemodynamically significant stenoses involving the
bilateral common femoral arteries, left greater than right.
3. Three-vessel runoff to the bilateral lower legs and feet with
dorsalis pedis artery is patent to the level of the forefeet
bilaterally.
4. Suspected hemodynamically significant narrowing involving the
proximal SMA, however both the celiac and IMA are widely patent
without hemodynamically significant stenosis.
5. Coronary artery calcifications.
NON-VASCULAR

1. Worsening right basilar consolidative opacities with associated
air bronchograms, progressed compared to the 01/15/2017 examination
and potentially indicative of worsening infection and/or aspiration.
Clinical correlation is advised.
2. Extensive colonic diverticulosis without evidence of
diverticulitis.

## 2018-11-04 DIAGNOSIS — K219 Gastro-esophageal reflux disease without esophagitis: Secondary | ICD-10-CM | POA: Diagnosis not present

## 2018-11-04 DIAGNOSIS — K3184 Gastroparesis: Secondary | ICD-10-CM | POA: Diagnosis not present

## 2018-11-04 DIAGNOSIS — R197 Diarrhea, unspecified: Secondary | ICD-10-CM | POA: Diagnosis not present

## 2018-11-25 ENCOUNTER — Ambulatory Visit: Payer: Medicare Other | Admitting: Internal Medicine

## 2018-12-08 ENCOUNTER — Other Ambulatory Visit: Payer: Self-pay

## 2018-12-08 ENCOUNTER — Encounter: Payer: Self-pay | Admitting: Internal Medicine

## 2018-12-08 ENCOUNTER — Ambulatory Visit (INDEPENDENT_AMBULATORY_CARE_PROVIDER_SITE_OTHER): Payer: Medicare Other | Admitting: Internal Medicine

## 2018-12-08 DIAGNOSIS — R911 Solitary pulmonary nodule: Secondary | ICD-10-CM | POA: Diagnosis not present

## 2018-12-08 DIAGNOSIS — E119 Type 2 diabetes mellitus without complications: Secondary | ICD-10-CM | POA: Diagnosis not present

## 2018-12-08 DIAGNOSIS — I2510 Atherosclerotic heart disease of native coronary artery without angina pectoris: Secondary | ICD-10-CM | POA: Diagnosis not present

## 2018-12-08 DIAGNOSIS — K3184 Gastroparesis: Secondary | ICD-10-CM | POA: Diagnosis not present

## 2018-12-08 DIAGNOSIS — E1143 Type 2 diabetes mellitus with diabetic autonomic (poly)neuropathy: Secondary | ICD-10-CM

## 2018-12-08 DIAGNOSIS — J479 Bronchiectasis, uncomplicated: Secondary | ICD-10-CM | POA: Diagnosis not present

## 2018-12-08 DIAGNOSIS — I10 Essential (primary) hypertension: Secondary | ICD-10-CM | POA: Diagnosis not present

## 2018-12-08 DIAGNOSIS — I70213 Atherosclerosis of native arteries of extremities with intermittent claudication, bilateral legs: Secondary | ICD-10-CM

## 2018-12-08 DIAGNOSIS — E7800 Pure hypercholesterolemia, unspecified: Secondary | ICD-10-CM

## 2018-12-08 NOTE — Progress Notes (Signed)
Patient ID: Debra Kirk, female   DOB: 04/22/1930, 85 y.o.   MRN: [id number] Virtual Visit via Telephone: Note  This visit type was conducted due to national recommendations for restrictions regarding the COVID-19 pandemic (e.g. social distancing).  This format is felt to be most appropriate for this patient at this time.  All issues noted in this document were discussed and addressed.  No physical exam was performed (except for noted visual exam findings with Video Visits).   I connected with Debra Kirk on 12/08/18 at  9:00 AM EDT by telephone and verified that I am speaking with the correct person using two identifiers. Location patient: home Location provider: work Persons participating in the telephone visit: patient, provider  I discussed the limitations, risks, security and privacy concerns of performing an evaluation and management service by telephone and the availability of in person appointments. The patient expressed understanding and agreed to proceed.   Reason for visit: scheduled follow up.   HPI: She reports she is doing well.  Trying to stay active.  Walking around her house.  Not going out.  Staying in.  Family bringing her food.  No fever.  No chest congestion, cough or sob.  Eating and drinking well.  No acid reflux.  No abdominal pain.  Bowels moving. Saw GI 11/04/18.  Was given Bentyl to have if needed.  Has not needed.  No chest pain with increased activity or exertion.  Had f/u CT scan 07/2018.  Unchanged.  Per report, no further imaging warranted.  She reports her sugars are averaging 150s in am.  May be a little higher in the pm.  Discussed low carb diet and exercise.  Handling stress well.     ROS: See pertinent positives and negatives per HPI.  Past Medical History:  Diagnosis Date  . Carotid artery disease (Debra Kirk)   . Compression fracture    s/p fosamax, prolia  . Diabetes mellitus (Debra Kirk)   . Femoral neuropathy    left, elevated CRP, ESR, negative FANA and  ANCA  . Fibrocystic breast disease   . Hypercholesterolemia   . Hypertension     Past Surgical History:  Procedure Laterality Date  . APPENDECTOMY    . CHOLECYSTECTOMY    . HERNIA REPAIR    . INGUINAL HERNIA REPAIR     left x 1, right x 2  . OVARIAN CYST REMOVAL    . PARTIAL HYSTERECTOMY      Family History  Problem Relation Age of Onset  . Heart disease Father        myocardial infarction  . Heart disease Mother        myocardial infarction  . Diabetes Mother   . Congestive Heart Failure Mother   . Bone cancer Sister   . Diabetes Sister        x2  . Ovarian cancer Sister 69  . Heart disease Sister        CABG  . Rheum arthritis Sister   . Breast cancer Neg Hx   . Colon cancer Neg Hx     SOCIAL HX: reviewed.    Current Outpatient Medications:  .  albuterol (PROVENTIL HFA;VENTOLIN HFA) 108 (90 Base) MCG/ACT inhaler, Inhale 2 puffs into the lungs every 6 (six) hours as needed for wheezing or shortness of breath. (Patient not taking: Reported on 04/01/2018), Disp: 1 Inhaler, Rfl: 2 .  AMBULATORY NON FORMULARY MEDICATION, Medication Name: Incentive Spirometry Use 10-15 times daily., Disp: 1 each, Rfl: 0 .  amLODipine (NORVASC) 5 MG tablet, TAKE 1 TABLET BY MOUTH DAILY, Disp: 90 tablet, Rfl: 1 .  aspirin 81 MG tablet, Take 81 mg by mouth daily., Disp: , Rfl:  .  Calcium Carbonate-Vitamin D (CALCIUM 600+D) 600-400 MG-UNIT per tablet, Take 1 tablet by mouth 2 (two) times daily., Disp: , Rfl:  .  denosumab (PROLIA) 60 MG/ML SOLN injection, Inject 60 mg into the skin every 6 (six) months. Administer in upper arm, thigh, or abdomen, Disp: , Rfl:  .  dicyclomine (BENTYL) 10 MG capsule, TAKE 1 CAPSULE BY MOUTH 3 (THREE) TIMES DAILY AS NEEDED FOR DIARRHEA/ABDOMINAL PAIN, Disp: , Rfl:  .  glimepiride (AMARYL) 2 MG tablet, TAKE 1 TABLET (2 MG TOTAL) BY MOUTH 2 (TWO) TIMES DAILY., Disp: 180 tablet, Rfl: 1 .  glucose blood (BAYER CONTOUR TEST) test strip, CHECK DAILY AND AS NEEDED  **E11.9**, Disp: 50 each, Rfl: 6 .  JARDIANCE 10 MG TABS tablet, TAKE 1 TABLET BY MOUTH DAILY., Disp: 30 tablet, Rfl: 3 .  meclizine (ANTIVERT) 12.5 MG tablet, Take 1 tablet (12.5 mg total) by mouth 2 (two) times daily as needed for dizziness., Disp: 20 tablet, Rfl: 0 .  metoCLOPramide (REGLAN) 5 MG tablet, Take 5 mg by mouth 2 (two) times daily. , Disp: , Rfl:  .  metoprolol tartrate (LOPRESSOR) 50 MG tablet, TAKE 1 AND 1/2 TABLET BY MOUTH TWICE A DAY, Disp: 270 tablet, Rfl: 3 .  omeprazole (PRILOSEC) 20 MG capsule, Take 1 capsule (20 mg total) by mouth 2 (two) times daily., Disp: 180 capsule, Rfl: 3 .  Respiratory Therapy Supplies (FLUTTER) DEVI, Use 10-15 times daily, Disp: 1 each, Rfl: 0 .  rosuvastatin (CRESTOR) 10 MG tablet, Take 1 tablet (10 mg total) by mouth daily., Disp: 90 tablet, Rfl: 3  EXAM:  GENERAL: alert.  Answering questions appropriately.  Sounds to be in no acute distress.    PSYCH/NEURO: pleasant and cooperative, no obvious depression or anxiety, speech and thought processing grossly intact  ASSESSMENT AND PLAN:  Discussed the following assessment and plan:  Atherosclerosis of native artery of both lower extremities with intermittent claudication (HCC)  Bronchiectasis without complication (HCC)  Coronary artery disease involving native coronary artery of native heart without angina pectoris  Type 2 diabetes mellitus without complication, without long-term current use of insulin ([place]) - Plan: Hemoglobin Z6X, Basic metabolic panel  Gastroparesis due to DM ([place])  Hypercholesterolemia - Plan: Hepatic function panel, Lipid panel  Essential hypertension - Plan: CBC with Differential/Platelet  Lung nodule  Atherosclerosis of native arteries of extremity with intermittent claudication ([place]) Has been evaluated by Dr Debra Kirk.  Continue risk factor modification.    Bronchiectasis without complication  [facility]) Has seen Dr Debra Kirk and Dr Debra Kirk.  Breathing stable.  No sob.     CAD (coronary artery disease) Stable.  Continue risk factor modification.    Diabetes mellitus type 2, uncomplicated (HCC) Last W9U 7.4.  Discussed low carb diet and exercise.  Follow met b and a1c.    Gastroparesis due to DM [facility]) Just saw GI.  Has bentyl if needed.  Has not needed since her last visit with GI.  Overall feels she is doing well.    Hypercholesterolemia On crestor.  Low cholesterol diet and exercise.  Follow lipid panel and liver function tests.    Hypertension She is not checking her pressures.  Feels has been doing ok.  Continue current medication regimen.  Follow pressures.  Follow metabolic panel.   Lung nodule Had chest CT  07/2018.  Unchanged. Per report, no further w/up warranted.      I discussed the assessment and treatment plan with the patient. The patient was provided an opportunity to ask questions and all were answered. The patient agreed with the plan and demonstrated an understanding of the instructions.   The patient was advised to call back or seek an in-person evaluation if the symptoms worsen or if the condition fails to improve as anticipated.  I provided 15 minutes of non-face-to-face time during this encounter.   Einar Pheasant, MD

## 2018-12-08 NOTE — Assessment & Plan Note (Signed)
Just saw GI.  Has bentyl if needed.  Has not needed since her last visit with GI.  Overall feels she is doing well.

## 2018-12-08 NOTE — Assessment & Plan Note (Signed)
She is not checking her pressures.  Feels has been doing ok.  Continue current medication regimen.  Follow pressures.  Follow metabolic panel.

## 2018-12-08 NOTE — Assessment & Plan Note (Signed)
Has seen Dr Mortimer Fries and Dr Alva Garnet.  Breathing stable.  No sob.

## 2018-12-08 NOTE — Assessment & Plan Note (Signed)
Had chest CT 07/2018.  Unchanged. Per report, no further w/up warranted.

## 2018-12-08 NOTE — Assessment & Plan Note (Signed)
Stable. Continue risk factor modification.  

## 2018-12-08 NOTE — Assessment & Plan Note (Signed)
Has been evaluated by Dr Delana Meyer.  Continue risk factor modification.

## 2018-12-08 NOTE — Assessment & Plan Note (Signed)
On crestor.  Low cholesterol diet and exercise.  Follow lipid panel and liver function tests.   

## 2018-12-08 NOTE — Assessment & Plan Note (Signed)
Last a1c 7.4.  Discussed low carb diet and exercise.  Follow met b and a1c.

## 2019-01-16 ENCOUNTER — Other Ambulatory Visit: Payer: Self-pay | Admitting: Internal Medicine

## 2019-01-22 ENCOUNTER — Other Ambulatory Visit: Payer: Self-pay | Admitting: Internal Medicine

## 2019-03-09 ENCOUNTER — Telehealth: Payer: Self-pay | Admitting: Internal Medicine

## 2019-03-09 DIAGNOSIS — M810 Age-related osteoporosis without current pathological fracture: Secondary | ICD-10-CM | POA: Diagnosis not present

## 2019-03-09 DIAGNOSIS — H353131 Nonexudative age-related macular degeneration, bilateral, early dry stage: Secondary | ICD-10-CM | POA: Diagnosis not present

## 2019-03-09 LAB — HM DIABETES EYE EXAM

## 2019-03-09 NOTE — Telephone Encounter (Signed)
Pt dropped off paper work for a medication renewal. Paper work is up front in [employer]

## 2019-03-09 NOTE — Telephone Encounter (Signed)
Paperwork completed and placed in quick sign for signature

## 2019-03-09 NOTE — Telephone Encounter (Signed)
Signed and placed in box.   

## 2019-03-13 NOTE — Telephone Encounter (Signed)
Pt will pick up original

## 2019-03-13 NOTE — Telephone Encounter (Signed)
Faxed

## 2019-03-22 ENCOUNTER — Encounter: Payer: Self-pay | Admitting: Internal Medicine

## 2019-03-22 NOTE — Progress Notes (Signed)
Abstraction for diabetic eye exam - no retinopathy

## 2019-03-25 ENCOUNTER — Other Ambulatory Visit: Payer: Medicare Other

## 2019-03-25 DIAGNOSIS — M810 Age-related osteoporosis without current pathological fracture: Secondary | ICD-10-CM | POA: Diagnosis not present

## 2019-03-26 ENCOUNTER — Ambulatory Visit: Payer: Self-pay | Admitting: Pharmacist

## 2019-03-26 NOTE — Chronic Care Management (AMB) (Signed)
  Chronic Care Management   Note  03/26/2019 Name: Debra Kirk MRN: [id number] DOB: 04-05-1930  [place] Debra Kirk is a 85 y.o. year old female who is a primary care patient of Einar Pheasant, MD.   Received question from clinic [place] [facility] regarding an application submitted for [employer] assistance for patient and a rejection notification they received; I contacted  patient assistance - patient had marked that she did not have Medicare and had commercial insurance. I informed [employer] that she only has Medicare A/B - they are going to reprocess application today.  I would be happy to outreach this patient and provide Chronic Care Management medication management and support for this patient with T2DM, CAD w/ HTN, HLD, among other chronic conditions. Will collaborate with PCP Dr. Nicki Reaper for referral if appropriate.   Catie Darnelle Maffucci, PharmD, [place] Pharmacist [facility] [place] [phone]

## 2019-03-26 NOTE — Progress Notes (Signed)
Reviewed information. [place] with CCM referral if pt is agreeable.    Dr Nicki Reaper

## 2019-03-30 ENCOUNTER — Ambulatory Visit: Payer: Medicare Other | Admitting: Internal Medicine

## 2019-04-11 ENCOUNTER — Other Ambulatory Visit: Payer: Self-pay | Admitting: Internal Medicine

## 2019-04-21 ENCOUNTER — Ambulatory Visit: Payer: Medicare Other | Admitting: Internal Medicine

## 2019-04-22 ENCOUNTER — Other Ambulatory Visit (INDEPENDENT_AMBULATORY_CARE_PROVIDER_SITE_OTHER): Payer: Medicare Other

## 2019-04-22 ENCOUNTER — Other Ambulatory Visit: Payer: Self-pay

## 2019-04-22 DIAGNOSIS — I10 Essential (primary) hypertension: Secondary | ICD-10-CM

## 2019-04-22 DIAGNOSIS — E119 Type 2 diabetes mellitus without complications: Secondary | ICD-10-CM

## 2019-04-22 DIAGNOSIS — E7800 Pure hypercholesterolemia, unspecified: Secondary | ICD-10-CM

## 2019-04-22 LAB — CBC WITH DIFFERENTIAL/PLATELET
Basophils Absolute: 0.1 10*3/uL (ref 0.0–0.1)
Basophils Relative: 1.2 % (ref 0.0–3.0)
Eosinophils Absolute: 0 10*3/uL (ref 0.0–0.7)
Eosinophils Relative: 0 % (ref 0.0–5.0)
HCT: 45.2 % (ref 36.0–46.0)
Hemoglobin: 15 g/dL (ref 12.0–15.0)
Lymphocytes Relative: 31.7 % (ref 12.0–46.0)
Lymphs Abs: 1.8 10*3/uL (ref 0.7–4.0)
MCHC: 33.1 g/dL (ref 30.0–36.0)
MCV: 89.7 fl (ref 78.0–100.0)
Monocytes Absolute: 0.5 10*3/uL (ref 0.1–1.0)
Monocytes Relative: 7.9 % (ref 3.0–12.0)
Neutro Abs: 3.4 10*3/uL (ref 1.4–7.7)
Neutrophils Relative %: 59.2 % (ref 43.0–77.0)
Platelets: 210 10*3/uL (ref 150.0–400.0)
RBC: 5.05 Mil/uL (ref 3.87–5.11)
RDW: 14.5 % (ref 11.5–15.5)
WBC: 5.7 10*3/uL (ref 4.0–10.5)

## 2019-04-22 LAB — HEMOGLOBIN A1C: Hgb A1c MFr Bld: 7.7 % — ABNORMAL HIGH (ref 4.6–6.5)

## 2019-04-22 LAB — LIPID PANEL
Cholesterol: 177 mg/dL (ref 0–200)
HDL: 51.2 mg/dL
LDL Cholesterol: 100 mg/dL — ABNORMAL HIGH (ref 0–99)
NonHDL: 125.3
Total CHOL/HDL Ratio: 3
Triglycerides: 128 mg/dL (ref 0.0–149.0)
VLDL: 25.6 mg/dL (ref 0.0–40.0)

## 2019-04-22 LAB — HEPATIC FUNCTION PANEL
ALT: 9 U/L (ref 0–35)
AST: 13 U/L (ref 0–37)
Albumin: 4.3 g/dL (ref 3.5–5.2)
Alkaline Phosphatase: 52 U/L (ref 39–117)
Bilirubin, Direct: 0.2 mg/dL (ref 0.0–0.3)
Total Bilirubin: 0.7 mg/dL (ref 0.2–1.2)
Total Protein: 7.3 g/dL (ref 6.0–8.3)

## 2019-04-22 LAB — BASIC METABOLIC PANEL WITH GFR
BUN: 17 mg/dL (ref 6–23)
CO2: 27 meq/L (ref 19–32)
Calcium: 9.1 mg/dL (ref 8.4–10.5)
Chloride: 100 meq/L (ref 96–112)
Creatinine, Ser: 0.91 mg/dL (ref 0.40–1.20)
GFR: 58.41 mL/min — ABNORMAL LOW
Glucose, Bld: 205 mg/dL — ABNORMAL HIGH (ref 70–99)
Potassium: 3.9 meq/L (ref 3.5–5.1)
Sodium: 136 meq/L (ref 135–145)

## 2019-04-28 ENCOUNTER — Encounter: Payer: Self-pay | Admitting: Internal Medicine

## 2019-04-28 ENCOUNTER — Other Ambulatory Visit: Payer: Self-pay

## 2019-04-28 ENCOUNTER — Ambulatory Visit (INDEPENDENT_AMBULATORY_CARE_PROVIDER_SITE_OTHER): Payer: Medicare Other | Admitting: Internal Medicine

## 2019-04-28 DIAGNOSIS — S32010D Wedge compression fracture of first lumbar vertebra, subsequent encounter for fracture with routine healing: Secondary | ICD-10-CM

## 2019-04-28 DIAGNOSIS — E119 Type 2 diabetes mellitus without complications: Secondary | ICD-10-CM | POA: Diagnosis not present

## 2019-04-28 DIAGNOSIS — I2510 Atherosclerotic heart disease of native coronary artery without angina pectoris: Secondary | ICD-10-CM

## 2019-04-28 DIAGNOSIS — J479 Bronchiectasis, uncomplicated: Secondary | ICD-10-CM | POA: Diagnosis not present

## 2019-04-28 DIAGNOSIS — I70213 Atherosclerosis of native arteries of extremities with intermittent claudication, bilateral legs: Secondary | ICD-10-CM | POA: Diagnosis not present

## 2019-04-28 DIAGNOSIS — E1143 Type 2 diabetes mellitus with diabetic autonomic (poly)neuropathy: Secondary | ICD-10-CM

## 2019-04-28 DIAGNOSIS — G6289 Other specified polyneuropathies: Secondary | ICD-10-CM

## 2019-04-28 DIAGNOSIS — E7800 Pure hypercholesterolemia, unspecified: Secondary | ICD-10-CM

## 2019-04-28 DIAGNOSIS — K3184 Gastroparesis: Secondary | ICD-10-CM | POA: Diagnosis not present

## 2019-04-28 DIAGNOSIS — I10 Essential (primary) hypertension: Secondary | ICD-10-CM | POA: Diagnosis not present

## 2019-04-28 LAB — HM DIABETES FOOT EXAM

## 2019-04-28 MED ORDER — METOPROLOL TARTRATE 50 MG PO TABS: ORAL_TABLET | ORAL | 3 refills | Status: DC

## 2019-04-28 MED ORDER — GLIMEPIRIDE 2 MG PO TABS: 2.0000 mg | ORAL_TABLET | Freq: Two times a day (BID) | ORAL | 1 refills | Status: DC

## 2019-04-28 MED ORDER — OMEPRAZOLE 20 MG PO CPDR: 20.0000 mg | DELAYED_RELEASE_CAPSULE | Freq: Two times a day (BID) | ORAL | 3 refills | Status: DC

## 2019-04-28 MED ORDER — ROSUVASTATIN CALCIUM 20 MG PO TABS: 20.0000 mg | ORAL_TABLET | Freq: Every day | ORAL | 1 refills | Status: DC

## 2019-04-28 NOTE — Progress Notes (Addendum)
Patient ID: Debra Kirk, female   DOB: Jan 07, 1930, 85 y.o.   MRN: [id number]   Subjective:    Patient ID: Debra Kirk, female    DOB: 01/09/1930, 85 y.o.   MRN: [id number]  HPI  Patient here for a scheduled follow up.  She reports she is doing relatively well.  Trying to watch what she eats.  Has been staying in due to covid restrictions.  No chest pain.  No sob.  No acid reflux.  No abdominal pain.  Bowels moving.  No fever, cough or congestion.  Discussed labs.  a1c 7.7.  She states she can do better with her diet.  Hold on changing medication.  Receiving prolia - through endocrinology.     Past Medical History:  Diagnosis Date  . Carotid artery disease ([place])   . Compression fracture    s/p fosamax, prolia  . Diabetes mellitus ([place])   . Femoral neuropathy    left, elevated CRP, ESR, negative FANA and ANCA  . Fibrocystic breast disease   . Hypercholesterolemia   . Hypertension    Past Surgical History:  Procedure Laterality Date  . APPENDECTOMY    . CHOLECYSTECTOMY    . HERNIA REPAIR    . INGUINAL HERNIA REPAIR     left x 1, right x 2  . OVARIAN CYST REMOVAL    . PARTIAL HYSTERECTOMY     Family History  Problem Relation Age of Onset  . Heart disease Father        myocardial infarction  . Heart disease Mother        myocardial infarction  . Diabetes Mother   . Congestive Heart Failure Mother   . Bone cancer Sister   . Diabetes Sister        x2  . Ovarian cancer Sister 69  . Heart disease Sister        CABG  . Rheum arthritis Sister   . Breast cancer Neg Hx   . Colon cancer Neg Hx    Social History   Socioeconomic History  . Marital status: Widowed    Spouse name: Not on file  . Number of children: 3  . Years of education: Not on file  . Highest education level: Not on file  Occupational History  . Not on file  Social Needs  . Financial resource strain: Not on file  . Food insecurity    Worry: Not on file    Inability: Not on file  .  Transportation needs    Medical: Not on file    Non-medical: Not on file  Tobacco Use  . Smoking status: Former Smoker    Quit date: 08/20/1958    Years since quitting: 60.7  . Smokeless tobacco: Never Used  Substance and Sexual Activity  . Alcohol use: No    Alcohol/week: 0.0 standard drinks  . Drug use: No  . Sexual activity: Never  Lifestyle  . Physical activity    Days per week: Not on file    Minutes per session: Not on file  . Stress: Not on file  Relationships  . Social Herbalist on phone: Not on file    Gets together: Not on file    Attends religious service: Not on file    Active member of club or organization: Not on file    Attends meetings of clubs or organizations: Not on file    Relationship status: Not on file  Other Topics Concern  .  Not on file  Social History Narrative  . Not on file    Outpatient Encounter Medications as of 04/28/2019  Medication Sig  . albuterol (PROVENTIL HFA;VENTOLIN HFA) 108 (90 Base) MCG/ACT inhaler Inhale 2 puffs into the lungs every 6 (six) hours as needed for wheezing or shortness of breath. (Patient not taking: Reported on 04/01/2018)  . AMBULATORY NON FORMULARY MEDICATION Medication Name: Incentive Spirometry Use 10-15 times daily.  Marland Kitchen amLODipine (NORVASC) 5 MG tablet TAKE 1 TABLET BY MOUTH DAILY  . aspirin 81 MG tablet Take 81 mg by mouth daily.  . Calcium Carbonate-Vitamin D (CALCIUM 600+D) 600-400 MG-UNIT per tablet Take 1 tablet by mouth 2 (two) times daily.  Marland Kitchen denosumab (PROLIA) 60 MG/ML SOLN injection Inject 60 mg into the skin every 6 (six) months. Administer in upper arm, thigh, or abdomen  . dicyclomine (BENTYL) 10 MG capsule TAKE 1 CAPSULE BY MOUTH 3 (THREE) TIMES DAILY AS NEEDED FOR DIARRHEA/ABDOMINAL PAIN  . glimepiride (AMARYL) 2 MG tablet Take 1 tablet (2 mg total) by mouth 2 (two) times daily.  Marland Kitchen glucose blood (BAYER CONTOUR TEST) test strip CHECK DAILY AND AS NEEDED **E11.9**  . JARDIANCE 10 MG TABS tablet  TAKE 1 TABLET BY MOUTH EVERY DAY  . meclizine (ANTIVERT) 12.5 MG tablet Take 1 tablet (12.5 mg total) by mouth 2 (two) times daily as needed for dizziness.  . metoCLOPramide (REGLAN) 5 MG tablet Take 5 mg by mouth 2 (two) times daily.   . metoprolol tartrate (LOPRESSOR) 50 MG tablet Take 1 and 1/2 tablet by mouth twice daily  . omeprazole (PRILOSEC) 20 MG capsule Take 1 capsule (20 mg total) by mouth 2 (two) times daily.  Marland Kitchen Respiratory Therapy Supplies (FLUTTER) DEVI Use 10-15 times daily  . rosuvastatin (CRESTOR) 20 MG tablet Take 1 tablet (20 mg total) by mouth daily.  . [DISCONTINUED] glimepiride (AMARYL) 2 MG tablet TAKE 1 TABLET (2 MG TOTAL) BY MOUTH 2 (TWO) TIMES DAILY.  . [DISCONTINUED] metoprolol tartrate (LOPRESSOR) 50 MG tablet TAKE 1 AND 1/2 TABLET BY MOUTH TWICE A DAY  . [DISCONTINUED] omeprazole (PRILOSEC) 20 MG capsule Take 1 capsule (20 mg total) by mouth 2 (two) times daily.  . [DISCONTINUED] rosuvastatin (CRESTOR) 10 MG tablet Take 1 tablet (10 mg total) by mouth daily.   No facility-administered encounter medications on file as of 04/28/2019.     Review of Systems  Constitutional: Negative for appetite change and unexpected weight change.  HENT: Negative for congestion and sinus pressure.   Respiratory: Negative for cough, chest tightness and shortness of breath.   Cardiovascular: Negative for chest pain, palpitations and leg swelling.  Gastrointestinal: Negative for abdominal pain, diarrhea, nausea and vomiting.  Genitourinary: Negative for difficulty urinating and dysuria.  Musculoskeletal: Negative for joint swelling and myalgias.  Skin: Negative for color change and rash.  Neurological: Negative for dizziness, light-headedness and headaches.  Psychiatric/Behavioral: Negative for agitation and dysphoric mood.       Objective:    Physical Exam Constitutional:      General: She is not in acute distress.    Appearance: Normal appearance.  HENT:     Right Ear:  External ear normal.     Left Ear: External ear normal.  Eyes:     General: No scleral icterus.       Right eye: No discharge.        Left eye: No discharge.     Conjunctiva/sclera: Conjunctivae normal.  Neck:     Musculoskeletal: Neck supple.  No muscular tenderness.     Thyroid: No thyromegaly.  Cardiovascular:     Rate and Rhythm: Normal rate and regular rhythm.  Pulmonary:     Effort: No respiratory distress.     Breath sounds: Normal breath sounds. No wheezing.  Abdominal:     General: Bowel sounds are normal.     Palpations: Abdomen is soft.     Tenderness: There is no abdominal tenderness.  Musculoskeletal:        General: No swelling or tenderness.  Lymphadenopathy:     Cervical: No cervical adenopathy.  Skin:    Findings: No erythema or rash.  Neurological:     Mental Status: She is alert.  Psychiatric:        Mood and Affect: Mood normal.        Behavior: Behavior normal.     BP 138/84   Pulse 63   Temp 97.7 F (36.5 C) (Temporal)   Resp 16   Wt 152 lb 9.6 oz (69.2 kg)   SpO2 97%   BMI 24.63 kg/m  Wt Readings from Last 3 Encounters:  04/28/19 152 lb 9.6 oz (69.2 kg)  07/15/18 151 lb 9.6 oz (68.8 kg)  04/01/18 150 lb (68 kg)     Lab Results  Component Value Date   WBC 5.7 04/22/2019   HGB 15.0 04/22/2019   HCT 45.2 04/22/2019   PLT 210.0 04/22/2019   GLUCOSE 205 (H) 04/22/2019   CHOL 177 04/22/2019   TRIG 128.0 04/22/2019   HDL 51.20 04/22/2019   LDLCALC 100 (H) 04/22/2019   ALT 9 04/22/2019   AST 13 04/22/2019   NA 136 04/22/2019   K 3.9 04/22/2019   CL 100 04/22/2019   CREATININE 0.91 04/22/2019   BUN 17 04/22/2019   CO2 27 04/22/2019   TSH 1.44 07/30/2018   HGBA1C 7.7 (H) 04/22/2019   MICROALBUR 3.9 (H) 07/30/2018    Ct Chest Wo Contrast  Result Date: 08/18/2018 CLINICAL DATA:  Follow-up exam for patient with pulmonary nodule. EXAM: CT CHEST WITHOUT CONTRAST TECHNIQUE: Multidetector CT imaging of the chest was performed following  the standard protocol without IV contrast. COMPARISON:  CT chest 06/17/2017, 01/15/2017 and 06/04/2011. FINDINGS: Cardiovascular: Extensive calcific aortic and coronary atherosclerosis is noted. No aneurysm. No pericardial effusion. Mild cardiomegaly noted. Mild prominence of the pulmonary outflow tract 3 cm is unchanged. Mediastinum/Nodes: No enlarged mediastinal or axillary lymph nodes. Thyroid gland, trachea, and esophagus demonstrate no significant findings. Lungs/Pleura: A 0.3 cm flat nodule is seen along a superior accessory fissure in the right lower lobe on image 92 of series 3 and is consistent with a lymph node. It is unchanged. No other nodule is identified. The lungs are otherwise clear. Upper Abdomen: Status post cholecystectomy. No acute or focal abnormality. Musculoskeletal: Remote L1 superior endplate compression fracture is noted. Hemangiomas in T8 and T4 are also identified. No lytic or sclerotic lesion. IMPRESSION: 0.3 cm nodule along a superior accessory fissure in the right lower lobe is unchanged and consistent with a benign lymph node. No follow-up imaging is recommended. Calcific coronary artery disease. Remote L1 superior endplate compression fracture. Aortic Atherosclerosis (ICD10-I70.0). Electronically Signed   By: Inge Rise M.D.   On: 08/18/2018 14:16       Assessment & Plan:   Problem List Items Addressed This Visit    Atherosclerosis of native arteries of extremity with intermittent claudication ([place])    Has been evaluated by Dr Delana Meyer.  Continue risk factor modification.  Bronchiectasis without complication [facility])    Has seen Dr Mortimer Fries and Dr Alva Garnet.  Breathing stable.        CAD (coronary artery disease)    Continue risk factor modification.  Stable.       Relevant Medications   metoprolol tartrate (LOPRESSOR) 50 MG tablet   rosuvastatin (CRESTOR) 20 MG tablet   Compression fracture of L1 lumbar vertebra [facility])    Seeing endocrinology.  Receiving  prolia injections.       Diabetes mellitus type 2, uncomplicated (HCC)    Low carb diet and exercise.  a1c 7.7.  Follow met b and a1c.  Hold on making any changes.  Pt 89 - a1c 7.7.  Follow.        Relevant Medications   glimepiride (AMARYL) 2 MG tablet   rosuvastatin (CRESTOR) 20 MG tablet   Other Relevant Orders   Hemoglobin E1D   Basic metabolic panel   Microalbumin / creatinine urine ratio   Gastroparesis due to DM (HCC)    Stable.  Has been evaluated by GI.  Continue current medication regimen.       Relevant Medications   glimepiride (AMARYL) 2 MG tablet   rosuvastatin (CRESTOR) 20 MG tablet   Hypercholesterolemia    On crestor.  Low cholesterol diet and exercise.  Follow lipid panel and liver function tests.        Relevant Medications   metoprolol tartrate (LOPRESSOR) 50 MG tablet   rosuvastatin (CRESTOR) 20 MG tablet   Other Relevant Orders   Lipid panel   Hepatic function panel   Hypertension    Blood pressure as outlined.  Improved on recheck. Have her spot check her pressure.  Send in readings.  Continue current medication regimen.  Follow metabolic panel.       Relevant Medications   metoprolol tartrate (LOPRESSOR) 50 MG tablet   rosuvastatin (CRESTOR) 20 MG tablet   Other Relevant Orders   TSH   Peripheral neuropathy    Stable.            Einar Pheasant, MD

## 2019-05-02 ENCOUNTER — Encounter: Payer: Self-pay | Admitting: Internal Medicine

## 2019-05-02 NOTE — Assessment & Plan Note (Signed)
Stable

## 2019-05-02 NOTE — Assessment & Plan Note (Signed)
Continue risk factor modification.  Stable.

## 2019-05-02 NOTE — Assessment & Plan Note (Signed)
Low carb diet and exercise.  a1c 7.7.  Follow met b and a1c.  Hold on making any changes.  Pt 89 - a1c 7.7.  Follow.

## 2019-05-02 NOTE — Assessment & Plan Note (Signed)
Stable.  Has been evaluated by GI.  Continue current medication regimen.

## 2019-05-02 NOTE — Assessment & Plan Note (Signed)
Has been evaluated by Dr Schnier.  Continue risk factor modification.   

## 2019-05-02 NOTE — Assessment & Plan Note (Signed)
Seeing endocrinology.  Receiving prolia injections.  

## 2019-05-02 NOTE — Assessment & Plan Note (Signed)
Blood pressure as outlined.  Improved on recheck. Have her spot check her pressure.  Send in readings.  Continue current medication regimen.  Follow metabolic panel.

## 2019-05-02 NOTE — Assessment & Plan Note (Signed)
On crestor.  Low cholesterol diet and exercise.  Follow lipid panel and liver function tests.   

## 2019-05-02 NOTE — Assessment & Plan Note (Signed)
Has seen Dr Mortimer Fries and Dr Alva Garnet.  Breathing stable.

## 2019-06-03 DIAGNOSIS — Z85828 Personal history of other malignant neoplasm of skin: Secondary | ICD-10-CM | POA: Diagnosis not present

## 2019-06-03 DIAGNOSIS — L821 Other seborrheic keratosis: Secondary | ICD-10-CM | POA: Diagnosis not present

## 2019-06-03 DIAGNOSIS — L570 Actinic keratosis: Secondary | ICD-10-CM | POA: Diagnosis not present

## 2019-06-03 DIAGNOSIS — Z08 Encounter for follow-up examination after completed treatment for malignant neoplasm: Secondary | ICD-10-CM | POA: Diagnosis not present

## 2019-06-10 ENCOUNTER — Telehealth: Payer: Self-pay | Admitting: Internal Medicine

## 2019-06-10 DIAGNOSIS — Z23 Encounter for immunization: Secondary | ICD-10-CM | POA: Diagnosis not present

## 2019-06-10 NOTE — Telephone Encounter (Signed)
Pt daughter dropped off parking form and medication form  Handed to [place]

## 2019-06-11 NOTE — Telephone Encounter (Signed)
Forms signed and placed in box.

## 2019-06-11 NOTE — Telephone Encounter (Signed)
Filled out forms and placed in quick sign for your signature.

## 2019-06-15 NOTE — Telephone Encounter (Signed)
I have called patient to let her know forms are ready. I will fax the application to patient assistance and handicap form has been placed up front for pick up. Patient said she will come pick up by Wednesday

## 2019-06-24 DIAGNOSIS — M810 Age-related osteoporosis without current pathological fracture: Secondary | ICD-10-CM | POA: Diagnosis not present

## 2019-07-01 DIAGNOSIS — E119 Type 2 diabetes mellitus without complications: Secondary | ICD-10-CM | POA: Diagnosis not present

## 2019-07-01 DIAGNOSIS — L03031 Cellulitis of right toe: Secondary | ICD-10-CM | POA: Diagnosis not present

## 2019-07-01 DIAGNOSIS — B351 Tinea unguium: Secondary | ICD-10-CM | POA: Diagnosis not present

## 2019-07-01 DIAGNOSIS — M79675 Pain in left toe(s): Secondary | ICD-10-CM | POA: Diagnosis not present

## 2019-07-01 DIAGNOSIS — M79674 Pain in right toe(s): Secondary | ICD-10-CM | POA: Diagnosis not present

## 2019-07-01 DIAGNOSIS — L600 Ingrowing nail: Secondary | ICD-10-CM | POA: Diagnosis not present

## 2019-07-07 ENCOUNTER — Other Ambulatory Visit: Payer: Self-pay

## 2019-07-07 ENCOUNTER — Other Ambulatory Visit: Payer: Self-pay | Admitting: Internal Medicine

## 2019-07-07 MED ORDER — ROSUVASTATIN CALCIUM 20 MG PO TABS: 20.0000 mg | ORAL_TABLET | Freq: Every day | ORAL | 1 refills | Status: DC

## 2019-07-27 ENCOUNTER — Other Ambulatory Visit: Payer: Self-pay

## 2019-07-29 ENCOUNTER — Other Ambulatory Visit (INDEPENDENT_AMBULATORY_CARE_PROVIDER_SITE_OTHER): Payer: Medicare Other

## 2019-07-29 ENCOUNTER — Other Ambulatory Visit: Payer: Self-pay

## 2019-07-29 DIAGNOSIS — E119 Type 2 diabetes mellitus without complications: Secondary | ICD-10-CM

## 2019-07-29 DIAGNOSIS — I10 Essential (primary) hypertension: Secondary | ICD-10-CM | POA: Diagnosis not present

## 2019-07-29 DIAGNOSIS — E7800 Pure hypercholesterolemia, unspecified: Secondary | ICD-10-CM

## 2019-07-29 LAB — LIPID PANEL
Cholesterol: 177 mg/dL (ref 0–200)
HDL: 51.4 mg/dL (ref >39.00)
LDL Cholesterol: 103 mg/dL — ABNORMAL HIGH (ref 0–99)
NonHDL: 125.19
Total CHOL/HDL Ratio: 3
Triglycerides: 110 mg/dL (ref 0.0–149.0)
VLDL: 22 mg/dL (ref 0.0–40.0)

## 2019-07-29 LAB — HEPATIC FUNCTION PANEL
ALT: 12 U/L (ref 0–35)
AST: 16 U/L (ref 0–37)
Albumin: 4.4 g/dL (ref 3.5–5.2)
Alkaline Phosphatase: 46 U/L (ref 39–117)
Bilirubin, Direct: 0.1 mg/dL (ref 0.0–0.3)
Total Bilirubin: 0.7 mg/dL (ref 0.2–1.2)
Total Protein: 7.5 g/dL (ref 6.0–8.3)

## 2019-07-29 LAB — MICROALBUMIN / CREATININE URINE RATIO
Creatinine,U: 99.5 mg/dL
Microalb Creat Ratio: 3.2 mg/g (ref 0.0–30.0)
Microalb, Ur: 3.2 mg/dL — ABNORMAL HIGH (ref 0.0–1.9)

## 2019-07-29 LAB — BASIC METABOLIC PANEL
BUN: 18 mg/dL (ref 6–23)
CO2: 25 mEq/L (ref 19–32)
Calcium: 9.1 mg/dL (ref 8.4–10.5)
Chloride: 101 mEq/L (ref 96–112)
Creatinine, Ser: 0.84 mg/dL (ref 0.40–1.20)
GFR: 64.03 mL/min (ref >60.00)
Glucose, Bld: 201 mg/dL — ABNORMAL HIGH (ref 70–99)
Potassium: 3.5 mEq/L (ref 3.5–5.1)
Sodium: 137 mEq/L (ref 135–145)

## 2019-07-29 LAB — HEMOGLOBIN A1C: Hgb A1c MFr Bld: 7.8 % — ABNORMAL HIGH (ref 4.6–6.5)

## 2019-07-29 LAB — TSH: TSH: 1.99 u[IU]/mL (ref 0.35–4.50)

## 2019-08-04 ENCOUNTER — Other Ambulatory Visit: Payer: Self-pay

## 2019-08-04 ENCOUNTER — Encounter: Payer: Self-pay | Admitting: Internal Medicine

## 2019-08-04 ENCOUNTER — Ambulatory Visit (INDEPENDENT_AMBULATORY_CARE_PROVIDER_SITE_OTHER): Payer: Medicare Other | Admitting: Internal Medicine

## 2019-08-04 DIAGNOSIS — E119 Type 2 diabetes mellitus without complications: Secondary | ICD-10-CM | POA: Diagnosis not present

## 2019-08-04 DIAGNOSIS — E7800 Pure hypercholesterolemia, unspecified: Secondary | ICD-10-CM

## 2019-08-04 DIAGNOSIS — J479 Bronchiectasis, uncomplicated: Secondary | ICD-10-CM

## 2019-08-04 DIAGNOSIS — G6289 Other specified polyneuropathies: Secondary | ICD-10-CM

## 2019-08-04 DIAGNOSIS — I2510 Atherosclerotic heart disease of native coronary artery without angina pectoris: Secondary | ICD-10-CM

## 2019-08-04 DIAGNOSIS — K3184 Gastroparesis: Secondary | ICD-10-CM

## 2019-08-04 DIAGNOSIS — E1143 Type 2 diabetes mellitus with diabetic autonomic (poly)neuropathy: Secondary | ICD-10-CM | POA: Diagnosis not present

## 2019-08-04 DIAGNOSIS — I10 Essential (primary) hypertension: Secondary | ICD-10-CM

## 2019-08-04 NOTE — Progress Notes (Signed)
Patient ID: Debra Kirk, female   DOB: 08/28/1929, 85 y.o.   MRN: [id number]   Virtual Visit via telephone Note  This visit type was conducted due to national recommendations for restrictions regarding the COVID-19 pandemic (e.g. social distancing).  This format is felt to be most appropriate for this patient at this time.  All issues noted in this document were discussed and addressed.  No physical exam was performed (except for noted visual exam findings with Video Visits).   I connected with Debra Kirk by telephone and verified that I am speaking with the correct person using two identifiers. Location patient: home Location provider: work Persons participating in the virtual visit: patient, provider  I discussed the limitations, risks, security and privacy concerns of performing an evaluation and management service by telephone and the availability of in person appointments.  The patient expressed understanding and agreed to proceed.   Reason for visit: scheduled follow up.    HPI: She reports she is doing relatively well.  States she is bored.  Having to stay in due to covid restrictions.  Family is supportive.  She does try to stay active.  States she gets up and walks in the house.  Does this daily.  No chest pain.  No sob.  Eating.  No nausea or vomiting.  Denies any problems swallowing.  No acid reflux.  No abdominal pain.  Bowels moving.  Some unsteadiness when walking.  Stable. Uses a cane.  Discussed neuropathy.  No headache or dizziness.  Followed by endocrinology for prolia injections. Wants to start getting these in our office.  Tolerating.  Last 8//20.  Discussed screening mammograms.  She declines.  Does not want to continue mammograms.  Discussed labs.  Sees podiatry.    ROS: See pertinent positives and negatives per HPI.  Past Medical History:  Diagnosis Date  . Carotid artery disease ([place])   . Compression fracture    s/p fosamax, prolia  . Diabetes mellitus ([place])    . Femoral neuropathy    left, elevated CRP, ESR, negative FANA and ANCA  . Fibrocystic breast disease   . Hypercholesterolemia   . Hypertension     Past Surgical History:  Procedure Laterality Date  . APPENDECTOMY    . CHOLECYSTECTOMY    . HERNIA REPAIR    . INGUINAL HERNIA REPAIR     left x 1, right x 2  . OVARIAN CYST REMOVAL    . PARTIAL HYSTERECTOMY      Family History  Problem Relation Age of Onset  . Heart disease Father        myocardial infarction  . Heart disease Mother        myocardial infarction  . Diabetes Mother   . Congestive Heart Failure Mother   . Bone cancer Sister   . Diabetes Sister        x2  . Ovarian cancer Sister 77  . Heart disease Sister        CABG  . Rheum arthritis Sister   . Breast cancer Neg Hx   . Colon cancer Neg Hx     SOCIAL HX: reviewed.    Current Outpatient Medications:  .  albuterol (PROVENTIL HFA;VENTOLIN HFA) 108 (90 Base) MCG/ACT inhaler, Inhale 2 puffs into the lungs every 6 (six) hours as needed for wheezing or shortness of breath. (Patient not taking: Reported on 04/01/2018), Disp: 1 Inhaler, Rfl: 2 .  AMBULATORY NON FORMULARY MEDICATION, Medication Name: Incentive Spirometry Use 10-15 times daily.,  Disp: 1 each, Rfl: 0 .  amLODipine (NORVASC) 5 MG tablet, TAKE 1 TABLET BY MOUTH DAILY, Disp: 90 tablet, Rfl: 1 .  aspirin 81 MG tablet, Take 81 mg by mouth daily., Disp: , Rfl:  .  Calcium Carbonate-Vitamin D (CALCIUM 600+D) 600-400 MG-UNIT per tablet, Take 1 tablet by mouth 2 (two) times daily., Disp: , Rfl:  .  denosumab (PROLIA) 60 MG/ML SOLN injection, Inject 60 mg into the skin every 6 (six) months. Administer in upper arm, thigh, or abdomen, Disp: , Rfl:  .  dicyclomine (BENTYL) 10 MG capsule, TAKE 1 CAPSULE BY MOUTH 3 (THREE) TIMES DAILY AS NEEDED FOR DIARRHEA/ABDOMINAL PAIN, Disp: , Rfl:  .  glimepiride (AMARYL) 2 MG tablet, Take 1 tablet (2 mg total) by mouth 2 (two) times daily., Disp: 180 tablet, Rfl: 1 .  glucose  blood (BAYER CONTOUR TEST) test strip, CHECK DAILY AND AS NEEDED **E11.9**, Disp: 50 each, Rfl: 6 .  JARDIANCE 10 MG TABS tablet, TAKE 1 TABLET BY MOUTH EVERY DAY, Disp: 30 tablet, Rfl: 3 .  meclizine (ANTIVERT) 12.5 MG tablet, Take 1 tablet (12.5 mg total) by mouth 2 (two) times daily as needed for dizziness., Disp: 20 tablet, Rfl: 0 .  metoCLOPramide (REGLAN) 5 MG tablet, Take 5 mg by mouth 2 (two) times daily. , Disp: , Rfl:  .  metoprolol tartrate (LOPRESSOR) 50 MG tablet, TAKE 1 AND 1/2 TABLETS BY MOUTH TWICE A DAY, Disp: 270 tablet, Rfl: 3 .  omeprazole (PRILOSEC) 20 MG capsule, Take 1 capsule (20 mg total) by mouth 2 (two) times daily., Disp: 180 capsule, Rfl: 3 .  Respiratory Therapy Supplies (FLUTTER) DEVI, Use 10-15 times daily, Disp: 1 each, Rfl: 0 .  rosuvastatin (CRESTOR) 20 MG tablet, Take 1 tablet (20 mg total) by mouth daily., Disp: 90 tablet, Rfl: 1  EXAM:  GENERAL: alert.  Sounds to be in no acute distress.  Answering questions appropriately.    PSYCH/NEURO: pleasant and cooperative, no obvious depression or anxiety, speech and thought processing grossly intact  ASSESSMENT AND PLAN:  Discussed the following assessment and plan:  Bronchiectasis without complication [facility]) Has seen Dr Alva Garnet and Dr Mortimer Fries.  Breathing stable.   CAD (coronary artery disease) Continue risk factor modification.  Stable.   Diabetes mellitus type 2, uncomplicated (HCC) Low carb diet and exercise.  a1c 7.8 - stable.  Continue current medication regimen.  Follow met b and a1c.   Gastroparesis due to DM [facility]) Has been evaluated by GI.  Stable.    Peripheral neuropathy Stable.   Hypertension Blood pressure under good control.  Continue same medication regimen.  Follow pressures.  Follow metabolic panel.    Hypercholesterolemia On crestor.  Low cholesterol diet and exercise.  Follow lipid panel and liver function tests.      I discussed the assessment and treatment plan with the patient.  The patient was provided an opportunity to ask questions and all were answered. The patient agreed with the plan and demonstrated an understanding of the instructions.   The patient was advised to call back or seek an in-person evaluation if the symptoms worsen or if the condition fails to improve as anticipated.  I provided 25 minutes of non-face-to-face time during this encounter.   Einar Pheasant, MD

## 2019-08-06 ENCOUNTER — Other Ambulatory Visit: Payer: Self-pay | Admitting: Internal Medicine

## 2019-08-07 ENCOUNTER — Other Ambulatory Visit: Payer: Self-pay | Admitting: Internal Medicine

## 2019-08-08 ENCOUNTER — Encounter: Payer: Self-pay | Admitting: Internal Medicine

## 2019-08-08 ENCOUNTER — Telehealth: Payer: Self-pay | Admitting: Internal Medicine

## 2019-08-08 NOTE — Assessment & Plan Note (Signed)
Continue risk factor modification.  Stable.

## 2019-08-08 NOTE — Assessment & Plan Note (Signed)
Blood pressure under good control.  Continue same medication regimen.  Follow pressures.  Follow metabolic panel.   

## 2019-08-08 NOTE — Assessment & Plan Note (Signed)
On crestor.  Low cholesterol diet and exercise.  Follow lipid panel and liver function tests.   

## 2019-08-08 NOTE — Assessment & Plan Note (Signed)
Has been evaluated by GI.  Stable.

## 2019-08-08 NOTE — Telephone Encounter (Signed)
Pt is receiving prolia at [facility] (through endocrinology).  She wants to start getting here.  Her last injection was 04/09/19.  What do we need to do to get her prolia here at our office.  Thanks

## 2019-08-08 NOTE — Assessment & Plan Note (Signed)
Stable

## 2019-08-08 NOTE — Assessment & Plan Note (Signed)
Low carb diet and exercise.  a1c 7.8 - stable.  Continue current medication regimen.  Follow met b and a1c.

## 2019-08-08 NOTE — Assessment & Plan Note (Signed)
Has seen Dr Alva Garnet and Dr Mortimer Fries.  Breathing stable.

## 2019-08-31 NOTE — Telephone Encounter (Signed)
Insurance verification for Prolia filed on [employer] Portal. 

## 2019-09-18 DIAGNOSIS — E119 Type 2 diabetes mellitus without complications: Secondary | ICD-10-CM | POA: Diagnosis not present

## 2019-09-18 LAB — HM DIABETES EYE EXAM

## 2019-10-01 DIAGNOSIS — M810 Age-related osteoporosis without current pathological fracture: Secondary | ICD-10-CM | POA: Diagnosis not present

## 2019-10-15 ENCOUNTER — Other Ambulatory Visit: Payer: Self-pay | Admitting: Internal Medicine

## 2019-10-22 ENCOUNTER — Telehealth: Payer: Self-pay | Admitting: Internal Medicine

## 2019-10-22 MED ORDER — GLUCOSE BLOOD VI STRP: ORAL_STRIP | 6 refills | Status: DC

## 2019-10-22 NOTE — Addendum Note (Signed)
Addended by: Elpidio Galea T on: 10/22/2019 04:13 PM   Modules accepted: Orders

## 2019-10-22 NOTE — Telephone Encounter (Signed)
Pt needs glucose blood (BAYER CONTOUR TEST) test strip sent to [facility]

## 2019-10-29 ENCOUNTER — Telehealth: Payer: Self-pay | Admitting: *Deleted

## 2019-10-29 NOTE — Telephone Encounter (Signed)
Patient Prolia approved for this office and first injection due 03/31/20 has been scheduled.

## 2019-10-29 NOTE — Telephone Encounter (Signed)
-----   Message from Nanci Pina, RN sent at 08/31/2019  4:19 PM EST ----- Regarding: Prolia Check Prolia verification

## 2019-11-02 DIAGNOSIS — K219 Gastro-esophageal reflux disease without esophagitis: Secondary | ICD-10-CM | POA: Diagnosis not present

## 2019-11-02 DIAGNOSIS — E1143 Type 2 diabetes mellitus with diabetic autonomic (poly)neuropathy: Secondary | ICD-10-CM | POA: Diagnosis not present

## 2019-11-02 DIAGNOSIS — R198 Other specified symptoms and signs involving the digestive system and abdomen: Secondary | ICD-10-CM | POA: Diagnosis not present

## 2019-11-02 DIAGNOSIS — K3184 Gastroparesis: Secondary | ICD-10-CM | POA: Diagnosis not present

## 2019-11-18 DIAGNOSIS — J340 Abscess, furuncle and carbuncle of nose: Secondary | ICD-10-CM | POA: Diagnosis not present

## 2019-11-18 DIAGNOSIS — R040 Epistaxis: Secondary | ICD-10-CM | POA: Diagnosis not present

## 2019-12-02 DIAGNOSIS — R040 Epistaxis: Secondary | ICD-10-CM | POA: Diagnosis not present

## 2019-12-02 DIAGNOSIS — J340 Abscess, furuncle and carbuncle of nose: Secondary | ICD-10-CM | POA: Diagnosis not present

## 2019-12-06 ENCOUNTER — Other Ambulatory Visit: Payer: Self-pay | Admitting: Internal Medicine

## 2019-12-09 ENCOUNTER — Other Ambulatory Visit (INDEPENDENT_AMBULATORY_CARE_PROVIDER_SITE_OTHER): Payer: Medicare Other

## 2019-12-09 ENCOUNTER — Other Ambulatory Visit: Payer: Self-pay

## 2019-12-09 DIAGNOSIS — E119 Type 2 diabetes mellitus without complications: Secondary | ICD-10-CM

## 2019-12-09 DIAGNOSIS — E7800 Pure hypercholesterolemia, unspecified: Secondary | ICD-10-CM

## 2019-12-09 LAB — BASIC METABOLIC PANEL
BUN: 21 mg/dL (ref 6–23)
CO2: 27 mEq/L (ref 19–32)
Calcium: 9 mg/dL (ref 8.4–10.5)
Chloride: 101 mEq/L (ref 96–112)
Creatinine, Ser: 0.79 mg/dL (ref 0.40–1.20)
GFR: 68.67 mL/min (ref >60.00)
Glucose, Bld: 222 mg/dL — ABNORMAL HIGH (ref 70–99)
Potassium: 3.8 mEq/L (ref 3.5–5.1)
Sodium: 136 mEq/L (ref 135–145)

## 2019-12-09 LAB — LIPID PANEL
Cholesterol: 164 mg/dL (ref 0–200)
HDL: 51.1 mg/dL (ref >39.00)
LDL Cholesterol: 91 mg/dL (ref 0–99)
NonHDL: 112.52
Total CHOL/HDL Ratio: 3
Triglycerides: 107 mg/dL (ref 0.0–149.0)
VLDL: 21.4 mg/dL (ref 0.0–40.0)

## 2019-12-09 LAB — HEPATIC FUNCTION PANEL
ALT: 13 U/L (ref 0–35)
AST: 16 U/L (ref 0–37)
Albumin: 4.2 g/dL (ref 3.5–5.2)
Alkaline Phosphatase: 38 U/L — ABNORMAL LOW (ref 39–117)
Bilirubin, Direct: 0.1 mg/dL (ref 0.0–0.3)
Total Bilirubin: 0.6 mg/dL (ref 0.2–1.2)
Total Protein: 7.2 g/dL (ref 6.0–8.3)

## 2019-12-09 LAB — HEMOGLOBIN A1C: Hgb A1c MFr Bld: 8.4 % — ABNORMAL HIGH (ref 4.6–6.5)

## 2019-12-15 ENCOUNTER — Telehealth (INDEPENDENT_AMBULATORY_CARE_PROVIDER_SITE_OTHER): Payer: Medicare Other | Admitting: Internal Medicine

## 2019-12-15 ENCOUNTER — Encounter: Payer: Self-pay | Admitting: Internal Medicine

## 2019-12-15 DIAGNOSIS — E7800 Pure hypercholesterolemia, unspecified: Secondary | ICD-10-CM | POA: Diagnosis not present

## 2019-12-15 DIAGNOSIS — I10 Essential (primary) hypertension: Secondary | ICD-10-CM | POA: Diagnosis not present

## 2019-12-15 DIAGNOSIS — E1143 Type 2 diabetes mellitus with diabetic autonomic (poly)neuropathy: Secondary | ICD-10-CM | POA: Diagnosis not present

## 2019-12-15 DIAGNOSIS — K3184 Gastroparesis: Secondary | ICD-10-CM | POA: Diagnosis not present

## 2019-12-15 DIAGNOSIS — G6289 Other specified polyneuropathies: Secondary | ICD-10-CM

## 2019-12-15 DIAGNOSIS — E119 Type 2 diabetes mellitus without complications: Secondary | ICD-10-CM | POA: Diagnosis not present

## 2019-12-15 DIAGNOSIS — I2510 Atherosclerotic heart disease of native coronary artery without angina pectoris: Secondary | ICD-10-CM | POA: Diagnosis not present

## 2019-12-15 DIAGNOSIS — J479 Bronchiectasis, uncomplicated: Secondary | ICD-10-CM | POA: Diagnosis not present

## 2019-12-15 NOTE — Patient Instructions (Signed)
increa

## 2019-12-15 NOTE — Progress Notes (Signed)
Patient ID: Debra Kirk, female   DOB: [birth year], 86 y.o.   MRN: [id number]   Virtual Visit via telephone Note  This visit type was conducted due to national recommendations for restrictions regarding the COVID-19 pandemic (e.g. social distancing).  This format is felt to be most appropriate for this patient at this time.  All issues noted in this document were discussed and addressed.  No physical exam was performed (except for noted visual exam findings with Video Visits).   I connected with [name] by telephone and verified that I am speaking with the correct person using two identifiers. Location patient: home Location provider: work Persons participating in the telehone visit: patient, provider  The limitations, risks, security and privacy concerns of performing an evaluation and management service by telephone and the availability of in person appointments have been discussed. The patient expressed understanding and agreed to proceed.   Reason for visit:  Scheduled follow up.   HPI: She reports she is doing relatively well.  Does report feeling tired.  Feels is related to staying home.  Trying to stay in due to covid restrictions.  No chest pain or sob.  No acid reflux.  Swallowing ok.  No abdominal pain.  Bowels ok.  Breathing ok.  Does walk in her house.  Blood sugar averaging 120-180.  States in 10/2019 - had episode while driving where she was not sure where she was.  Initially states couldn't see, but on further questioning, it appears that for a brief second she did not know where she was.  Only last for a second and then recognized her surroundings and drove home.  She is no longer driving.  Discussed further w/up and evaluation.  She declines.  Has not recurred.  Declines further w/up.  Discussed elevated blood sugars.  Discussed increasing jardiance to 69m q day.  She is agreeable.  [phone]    ROS: See  pertinent positives and negatives per HPI.  Past Medical History:  Diagnosis Date  . Carotid artery disease ([place]   . Compression fracture    s/p fosamax, prolia  . Diabetes mellitus ([place]   . Femoral neuropathy    left, elevated CRP, ESR, negative FANA and ANCA  . Fibrocystic breast disease   . Hypercholesterolemia   . Hypertension     Past Surgical History:  Procedure Laterality Date  . APPENDECTOMY    . CHOLECYSTECTOMY    . HERNIA REPAIR    . INGUINAL HERNIA REPAIR     left x 1, right x 2  . OVARIAN CYST REMOVAL    . PARTIAL HYSTERECTOMY      Family History  Problem Relation Age of Onset  . Heart disease Father        myocardial infarction  . Heart disease Mother        myocardial infarction  . Diabetes Mother   . Congestive Heart Failure Mother   . Bone cancer Sister   . Diabetes Sister        x2  . Ovarian cancer Sister 757 . Heart disease Sister        CABG  . Rheum arthritis Sister   . Breast cancer Neg Hx   . Colon cancer Neg Hx     SOCIAL HX: reviewed.    Current Outpatient Medications:  .  albuterol (PROVENTIL HFA;VENTOLIN HFA) 108 (90 Base) MCG/ACT inhaler, Inhale 2 puffs into the lungs every 6 (six) hours  as needed for wheezing or shortness of breath. (Patient not taking: Reported on 04/01/2018), Disp: 1 Inhaler, Rfl: 2 .  AMBULATORY NON FORMULARY MEDICATION, Medication Name: Incentive Spirometry Use 10-15 times daily., Disp: 1 each, Rfl: 0 .  amLODipine (NORVASC) 5 MG tablet, TAKE 1 TABLET BY MOUTH DAILY, Disp: 90 tablet, Rfl: 1 .  aspirin 81 MG tablet, Take 81 mg by mouth daily., Disp: , Rfl:  .  Calcium Carbonate-Vitamin D (CALCIUM 600+D) 600-400 MG-UNIT per tablet, Take 1 tablet by mouth 2 (two) times daily., Disp: , Rfl:  .  denosumab (PROLIA) 60 MG/ML SOLN injection, Inject 60 mg into the skin every 6 (six) months. Administer in upper arm, thigh, or abdomen, Disp: , Rfl:  .  dicyclomine (BENTYL) 10 MG capsule, TAKE 1 CAPSULE BY MOUTH 3 (THREE)  TIMES DAILY AS NEEDED FOR DIARRHEA/ABDOMINAL PAIN, Disp: , Rfl:  .  empagliflozin (JARDIANCE) 25 MG TABS tablet, Take 25 mg by mouth daily., Disp: 90 tablet, Rfl: 3 .  glimepiride (AMARYL) 2 MG tablet, TAKE 1 TABLET BY MOUTH  TWICE DAILY, Disp: 180 tablet, Rfl: 3 .  glucose blood (BAYER CONTOUR TEST) test strip, CHECK DAILY AND AS NEEDED **E11.9**, Disp: 50 each, Rfl: 6 .  meclizine (ANTIVERT) 12.5 MG tablet, Take 1 tablet (12.5 mg total) by mouth 2 (two) times daily as needed for dizziness., Disp: 20 tablet, Rfl: 0 .  metoCLOPramide (REGLAN) 5 MG tablet, Take 5 mg by mouth 2 (two) times daily. , Disp: , Rfl:  .  metoprolol tartrate (LOPRESSOR) 50 MG tablet, TAKE 1 AND 1/2 TABLETS BY MOUTH TWICE A DAY, Disp: 270 tablet, Rfl: 3 .  omeprazole (PRILOSEC) 20 MG capsule, Take 1 capsule (20 mg total) by mouth 2 (two) times daily., Disp: 180 capsule, Rfl: 3 .  Respiratory Therapy Supplies (FLUTTER) DEVI, Use 10-15 times daily, Disp: 1 each, Rfl: 0 .  rosuvastatin (CRESTOR) 20 MG tablet, Take 1 tablet (20 mg total) by mouth daily., Disp: 90 tablet, Rfl: 1  EXAM:  GENERAL: alert.  Sounds to be in no acute distress.  Answering questions appropriatley.    PSYCH/NEURO: pleasant and cooperative, no obvious depression or anxiety, speech and thought processing grossly intact  ASSESSMENT AND PLAN:  Discussed the following assessment and plan:  Peripheral neuropathy Stable.    Hypertension Blood pressure under good control.  Continue same medication regimen - amlodipine and metoprolol.  Follow pressures.  Follow metabolic panel.    Hypercholesterolemia On crestor.  Low cholesterol diet and exercise.  Follow lipid panel and liver function tests.    Gastroparesis due to DM [facility]) Has been evaluated by GI.  Stable.  Continues on reglan.  Prescribed by GI.  Tolerating.    Diabetes mellitus type 2, uncomplicated (HCC) Low carb diet and exercise.  On jardiance 40m.  Discussed increasing to 227mq day.   Follow met b and a1c.   CAD (coronary artery disease) Continue risk factor modification.    Bronchiectasis without complication ([place].  Breathing stable.     Orders Placed This Encounter  Procedures  . Basic metabolic panel    Standing Status:   Future    Standing Expiration Date:   12/19/2020     I discussed the assessment and treatment plan with the patient. The patient was provided an opportunity to ask questions and all were answered. The patient agreed with the plan and demonstrated an understanding of the instructions.   The patient was advised to call back or  seek an in-person evaluation if the symptoms worsen or if the condition fails to improve as anticipated.  I provided 25 minutes of non-face-to-face time during this encounter.   Einar Pheasant, MD

## 2019-12-16 ENCOUNTER — Other Ambulatory Visit: Payer: Self-pay

## 2019-12-16 MED ORDER — JARDIANCE 25 MG PO TABS: 25.0000 mg | ORAL_TABLET | Freq: Every day | ORAL | 3 refills | Status: DC

## 2019-12-20 ENCOUNTER — Encounter: Payer: Self-pay | Admitting: Internal Medicine

## 2019-12-20 NOTE — Assessment & Plan Note (Signed)
On crestor.  Low cholesterol diet and exercise.  Follow lipid panel and liver function tests.   

## 2019-12-20 NOTE — Assessment & Plan Note (Signed)
Blood pressure under good control.  Continue same medication regimen - amlodipine and metoprolol.  Follow pressures.  Follow metabolic panel.

## 2019-12-20 NOTE — Assessment & Plan Note (Signed)
Has seen pulmonary.  Breathing stable.   

## 2019-12-20 NOTE — Assessment & Plan Note (Signed)
Low carb diet and exercise.  On jardiance 79m.  Discussed increasing to 237mq day.  Follow met b and a1c.

## 2019-12-20 NOTE — Assessment & Plan Note (Signed)
Continue risk factor modification 

## 2019-12-20 NOTE — Assessment & Plan Note (Signed)
Stable

## 2019-12-20 NOTE — Assessment & Plan Note (Signed)
Has been evaluated by GI.  Stable.  Continues on reglan.  Prescribed by GI.  Tolerating.

## 2019-12-29 ENCOUNTER — Other Ambulatory Visit: Payer: Self-pay | Admitting: Internal Medicine

## 2020-01-06 ENCOUNTER — Other Ambulatory Visit (INDEPENDENT_AMBULATORY_CARE_PROVIDER_SITE_OTHER): Payer: Medicare Other

## 2020-01-06 ENCOUNTER — Other Ambulatory Visit: Payer: Self-pay

## 2020-01-06 DIAGNOSIS — E119 Type 2 diabetes mellitus without complications: Secondary | ICD-10-CM

## 2020-01-06 LAB — BASIC METABOLIC PANEL
BUN: 19 mg/dL (ref 6–23)
CO2: 25 mEq/L (ref 19–32)
Calcium: 8.7 mg/dL (ref 8.4–10.5)
Chloride: 102 mEq/L (ref 96–112)
Creatinine, Ser: 0.82 mg/dL (ref 0.40–1.20)
GFR: 65.76 mL/min (ref >60.00)
Glucose, Bld: 259 mg/dL — ABNORMAL HIGH (ref 70–99)
Potassium: 3.6 mEq/L (ref 3.5–5.1)
Sodium: 137 mEq/L (ref 135–145)

## 2020-01-24 ENCOUNTER — Observation Stay
Admission: EM | Admit: 2020-01-24 | Discharge: 2020-01-25 | Disposition: A | Discharge status: Home / Self Care | Payer: Medicare Other | Attending: Internal Medicine | Admitting: Internal Medicine

## 2020-01-24 ENCOUNTER — Emergency Department: Payer: Medicare Other

## 2020-01-24 ENCOUNTER — Other Ambulatory Visit: Payer: Self-pay

## 2020-01-24 DIAGNOSIS — K219 Gastro-esophageal reflux disease without esophagitis: Secondary | ICD-10-CM | POA: Insufficient documentation

## 2020-01-24 DIAGNOSIS — R4020 Unspecified coma: Secondary | ICD-10-CM

## 2020-01-24 DIAGNOSIS — Z20822 Contact with and (suspected) exposure to covid-19: Secondary | ICD-10-CM | POA: Insufficient documentation

## 2020-01-24 DIAGNOSIS — I160 Hypertensive urgency: Secondary | ICD-10-CM | POA: Diagnosis not present

## 2020-01-24 DIAGNOSIS — K3184 Gastroparesis: Secondary | ICD-10-CM | POA: Diagnosis not present

## 2020-01-24 DIAGNOSIS — Z87891 Personal history of nicotine dependence: Secondary | ICD-10-CM | POA: Insufficient documentation

## 2020-01-24 DIAGNOSIS — I340 Nonrheumatic mitral (valve) insufficiency: Secondary | ICD-10-CM | POA: Insufficient documentation

## 2020-01-24 DIAGNOSIS — I6523 Occlusion and stenosis of bilateral carotid arteries: Secondary | ICD-10-CM | POA: Insufficient documentation

## 2020-01-24 DIAGNOSIS — Z9049 Acquired absence of other specified parts of digestive tract: Secondary | ICD-10-CM | POA: Insufficient documentation

## 2020-01-24 DIAGNOSIS — Z90710 Acquired absence of both cervix and uterus: Secondary | ICD-10-CM | POA: Diagnosis not present

## 2020-01-24 DIAGNOSIS — I4510 Unspecified right bundle-branch block: Secondary | ICD-10-CM | POA: Insufficient documentation

## 2020-01-24 DIAGNOSIS — E871 Hypo-osmolality and hyponatremia: Secondary | ICD-10-CM | POA: Insufficient documentation

## 2020-01-24 DIAGNOSIS — B9620 Unspecified Escherichia coli [E. coli] as the cause of diseases classified elsewhere: Secondary | ICD-10-CM | POA: Insufficient documentation

## 2020-01-24 DIAGNOSIS — R8271 Bacteriuria: Secondary | ICD-10-CM | POA: Insufficient documentation

## 2020-01-24 DIAGNOSIS — I441 Atrioventricular block, second degree: Secondary | ICD-10-CM | POA: Diagnosis not present

## 2020-01-24 DIAGNOSIS — Z79899 Other long term (current) drug therapy: Secondary | ICD-10-CM | POA: Insufficient documentation

## 2020-01-24 DIAGNOSIS — E785 Hyperlipidemia, unspecified: Secondary | ICD-10-CM | POA: Diagnosis not present

## 2020-01-24 DIAGNOSIS — E7800 Pure hypercholesterolemia, unspecified: Secondary | ICD-10-CM | POA: Diagnosis not present

## 2020-01-24 DIAGNOSIS — E1165 Type 2 diabetes mellitus with hyperglycemia: Secondary | ICD-10-CM

## 2020-01-24 DIAGNOSIS — E1143 Type 2 diabetes mellitus with diabetic autonomic (poly)neuropathy: Secondary | ICD-10-CM | POA: Insufficient documentation

## 2020-01-24 DIAGNOSIS — I10 Essential (primary) hypertension: Secondary | ICD-10-CM | POA: Diagnosis not present

## 2020-01-24 DIAGNOSIS — R55 Syncope and collapse: Principal | ICD-10-CM

## 2020-01-24 DIAGNOSIS — I2510 Atherosclerotic heart disease of native coronary artery without angina pectoris: Secondary | ICD-10-CM | POA: Insufficient documentation

## 2020-01-24 DIAGNOSIS — E876 Hypokalemia: Secondary | ICD-10-CM | POA: Insufficient documentation

## 2020-01-24 DIAGNOSIS — E1142 Type 2 diabetes mellitus with diabetic polyneuropathy: Secondary | ICD-10-CM | POA: Diagnosis not present

## 2020-01-24 DIAGNOSIS — Z880 Allergy status to penicillin: Secondary | ICD-10-CM | POA: Diagnosis not present

## 2020-01-24 DIAGNOSIS — Z7984 Long term (current) use of oral hypoglycemic drugs: Secondary | ICD-10-CM | POA: Insufficient documentation

## 2020-01-24 DIAGNOSIS — Z8249 Family history of ischemic heart disease and other diseases of the circulatory system: Secondary | ICD-10-CM | POA: Insufficient documentation

## 2020-01-24 DIAGNOSIS — Z7982 Long term (current) use of aspirin: Secondary | ICD-10-CM | POA: Insufficient documentation

## 2020-01-24 DIAGNOSIS — Z8261 Family history of arthritis: Secondary | ICD-10-CM | POA: Insufficient documentation

## 2020-01-24 DIAGNOSIS — Z833 Family history of diabetes mellitus: Secondary | ICD-10-CM | POA: Insufficient documentation

## 2020-01-24 DIAGNOSIS — Z8041 Family history of malignant neoplasm of ovary: Secondary | ICD-10-CM | POA: Insufficient documentation

## 2020-01-24 LAB — CBC
HCT: 43.2 % (ref 36.0–46.0)
Hemoglobin: 14.4 g/dL (ref 12.0–15.0)
MCH: 30.1 pg (ref 26.0–34.0)
MCHC: 33.3 g/dL (ref 30.0–36.0)
MCV: 90.2 fL (ref 80.0–100.0)
Platelets: 183 10*3/uL (ref 150–400)
RBC: 4.79 MIL/uL (ref 3.87–5.11)
RDW: 13.9 % (ref 11.5–15.5)
WBC: 6.4 10*3/uL (ref 4.0–10.5)
nRBC: 0 % (ref 0.0–0.2)

## 2020-01-24 LAB — URINALYSIS, COMPLETE (UACMP) WITH MICROSCOPIC
Bacteria, UA: NONE SEEN
Bilirubin Urine: NEGATIVE
Glucose, UA: >=500 mg/dL — AB
Hgb urine dipstick: NEGATIVE
Ketones, ur: NEGATIVE mg/dL
Leukocytes,Ua: NEGATIVE
Nitrite: NEGATIVE
Protein, ur: NEGATIVE mg/dL
Specific Gravity, Urine: 1.031 — ABNORMAL HIGH (ref 1.005–1.030)
pH: 5 (ref 5.0–8.0)

## 2020-01-24 LAB — TROPONIN I (HIGH SENSITIVITY): Troponin I (High Sensitivity): 4 ng/L (ref <18)

## 2020-01-24 LAB — BASIC METABOLIC PANEL
Anion gap: 11 (ref 5–15)
BUN: 19 mg/dL (ref 8–23)
CO2: 24 mmol/L (ref 22–32)
Calcium: 9.1 mg/dL (ref 8.9–10.3)
Chloride: 101 mmol/L (ref 98–111)
Creatinine, Ser: 1.14 mg/dL — ABNORMAL HIGH (ref 0.44–1.00)
GFR calc Af Amer: 50 mL/min — ABNORMAL LOW (ref >60)
GFR calc non Af Amer: 43 mL/min — ABNORMAL LOW (ref >60)
Glucose, Bld: 315 mg/dL — ABNORMAL HIGH (ref 70–99)
Potassium: 4 mmol/L (ref 3.5–5.1)
Sodium: 136 mmol/L (ref 135–145)

## 2020-01-24 LAB — GLUCOSE, CAPILLARY
Glucose-Capillary: 139 mg/dL — ABNORMAL HIGH (ref 70–99)
Glucose-Capillary: 205 mg/dL — ABNORMAL HIGH (ref 70–99)

## 2020-01-24 LAB — SARS CORONAVIRUS 2 BY RT PCR (HOSPITAL ORDER, PERFORMED IN ~~LOC~~ HOSPITAL LAB): SARS Coronavirus 2: NEGATIVE

## 2020-01-24 MED ORDER — ONDANSETRON HCL 4 MG/2ML IJ SOLN: 4.0000 mg | Freq: Four times a day (QID) | INTRAMUSCULAR | Status: DC | PRN

## 2020-01-24 MED ORDER — METOCLOPRAMIDE HCL 5 MG PO TABS
5.0000 mg | ORAL_TABLET | Freq: Two times a day (BID) | ORAL | Status: DC
  Administered 2020-01-24 – 2020-01-25 (×2): 5 mg via ORAL
  Filled 2020-01-24 (×2): qty 1

## 2020-01-24 MED ORDER — METOPROLOL TARTRATE 50 MG PO TABS: 75.0000 mg | ORAL_TABLET | Freq: Two times a day (BID) | ORAL | Status: DC

## 2020-01-24 MED ORDER — SODIUM CHLORIDE 0.9 % IV BOLUS
1000.0000 mL | Freq: Once | INTRAVENOUS | Status: AC
  Administered 2020-01-24: 1000 mL via INTRAVENOUS

## 2020-01-24 MED ORDER — GLIMEPIRIDE 2 MG PO TABS: 2.0000 mg | ORAL_TABLET | Freq: Two times a day (BID) | ORAL | Status: DC

## 2020-01-24 MED ORDER — MECLIZINE HCL 12.5 MG PO TABS
12.5000 mg | ORAL_TABLET | Freq: Two times a day (BID) | ORAL | Status: DC | PRN
  Filled 2020-01-24: qty 1

## 2020-01-24 MED ORDER — SODIUM CHLORIDE 0.9% FLUSH
3.0000 mL | Freq: Two times a day (BID) | INTRAVENOUS | Status: DC
  Administered 2020-01-24 – 2020-01-25 (×2): 3 mL via INTRAVENOUS

## 2020-01-24 MED ORDER — SODIUM CHLORIDE 0.9 % IV SOLN: INTRAVENOUS | Status: DC

## 2020-01-24 MED ORDER — EMPAGLIFLOZIN 25 MG PO TABS: 25.0000 mg | ORAL_TABLET | Freq: Every day | ORAL | Status: DC

## 2020-01-24 MED ORDER — MAGNESIUM HYDROXIDE 400 MG/5ML PO SUSP
30.0000 mL | Freq: Every day | ORAL | Status: DC | PRN
  Filled 2020-01-24: qty 30

## 2020-01-24 MED ORDER — CALCIUM CARBONATE-VITAMIN D 500-200 MG-UNIT PO TABS
1.0000 | ORAL_TABLET | Freq: Two times a day (BID) | ORAL | Status: DC
  Administered 2020-01-24 – 2020-01-25 (×2): 1 via ORAL
  Filled 2020-01-24 (×4): qty 1

## 2020-01-24 MED ORDER — HYDRALAZINE HCL 50 MG PO TABS: 50.0000 mg | ORAL_TABLET | Freq: Four times a day (QID) | ORAL | Status: DC | PRN

## 2020-01-24 MED ORDER — PANTOPRAZOLE SODIUM 40 MG PO TBEC
40.0000 mg | DELAYED_RELEASE_TABLET | Freq: Every day | ORAL | Status: DC
  Administered 2020-01-25: 40 mg via ORAL
  Filled 2020-01-24: qty 1

## 2020-01-24 MED ORDER — ROSUVASTATIN CALCIUM 20 MG PO TABS
20.0000 mg | ORAL_TABLET | Freq: Every day | ORAL | Status: DC
  Administered 2020-01-25: 17:00:00 20 mg via ORAL
  Filled 2020-01-24: qty 1

## 2020-01-24 MED ORDER — ACETAMINOPHEN 650 MG RE SUPP: 650.0000 mg | Freq: Four times a day (QID) | RECTAL | Status: DC | PRN

## 2020-01-24 MED ORDER — ENOXAPARIN SODIUM 40 MG/0.4ML ~~LOC~~ SOLN
40.0000 mg | SUBCUTANEOUS | Status: DC
  Administered 2020-01-24: 40 mg via SUBCUTANEOUS
  Filled 2020-01-24: qty 0.4

## 2020-01-24 MED ORDER — TRAZODONE HCL 50 MG PO TABS: 25.0000 mg | ORAL_TABLET | Freq: Every evening | ORAL | Status: DC | PRN

## 2020-01-24 MED ORDER — AMLODIPINE BESYLATE 5 MG PO TABS
5.0000 mg | ORAL_TABLET | Freq: Every day | ORAL | Status: DC
  Administered 2020-01-25: 5 mg via ORAL
  Filled 2020-01-24: qty 1

## 2020-01-24 MED ORDER — INSULIN ASPART 100 UNIT/ML ~~LOC~~ SOLN
0.0000 [IU] | SUBCUTANEOUS | Status: DC
  Administered 2020-01-24 – 2020-01-25 (×2): 3 [IU] via SUBCUTANEOUS
  Administered 2020-01-25 (×2): 1 [IU] via SUBCUTANEOUS
  Filled 2020-01-24 (×4): qty 1

## 2020-01-24 MED ORDER — ONDANSETRON HCL 4 MG PO TABS: 4.0000 mg | ORAL_TABLET | Freq: Four times a day (QID) | ORAL | Status: DC | PRN

## 2020-01-24 MED ORDER — ASPIRIN EC 81 MG PO TBEC
81.0000 mg | DELAYED_RELEASE_TABLET | Freq: Every day | ORAL | Status: DC
  Administered 2020-01-25: 81 mg via ORAL
  Filled 2020-01-24: qty 1

## 2020-01-24 MED ORDER — DICYCLOMINE HCL 10 MG PO CAPS
10.0000 mg | ORAL_CAPSULE | Freq: Three times a day (TID) | ORAL | Status: DC
  Administered 2020-01-25 (×3): 10 mg via ORAL
  Filled 2020-01-24 (×4): qty 1

## 2020-01-24 MED ORDER — METOPROLOL TARTRATE 25 MG PO TABS
25.0000 mg | ORAL_TABLET | Freq: Two times a day (BID) | ORAL | Status: DC
  Administered 2020-01-24 – 2020-01-25 (×2): 25 mg via ORAL
  Filled 2020-01-24 (×2): qty 1

## 2020-01-24 MED ORDER — ACETAMINOPHEN 325 MG PO TABS: 650.0000 mg | ORAL_TABLET | Freq: Four times a day (QID) | ORAL | Status: DC | PRN

## 2020-01-24 NOTE — ED Triage Notes (Signed)
Pt states she was at church eating outside when she had syncopal episode. States it's hot outside. States EMS arrived and told pt she needed to come to ER. Family brought pt. CBG was in 200's. Pt states she felt dizzy in her head after waking up on ground (states family laid her down when she wouldn't talk to them or answer questions).   A&O, in wheelchair. Appears sweaty.

## 2020-01-24 NOTE — ED Notes (Signed)
Dr. Monks at bedside 

## 2020-01-24 NOTE — ED Notes (Signed)
Assigned bed @ 2202, spoke with RN Eugene Garnet

## 2020-01-24 NOTE — H&P (Addendum)
[place] [facility] [place] NAME: Debra Kirk    MR#:  [id number]  DATE OF BIRTH:  1930/02/04  DATE OF ADMISSION:  01/24/2020  PRIMARY CARE PHYSICIAN: Einar Pheasant, MD   REQUESTING/REFERRING PHYSICIAN: Derrell Lolling, MD CHIEF COMPLAINT:   Chief Complaint  Patient presents with  . Loss of Consciousness    HISTORY OF PRESENT ILLNESS:  Debra Kirk  is a 86 y.o. Caucasian female with a known history of type diabetes mellitus, dyslipidemia, hypertension carotid artery disease, presented to the emergency room with acute onset of unresponsiveness while opening her eyes and staring.  The patient was having lunch outside of her church today with her family and suddenly became unresponsive while staring and looking into the space and opening her mouth.  That lasted about 4 minutes.  They then held her down and raised her leg.  She did not have any shaking or seizures witnessed.  No reported nausea or vomiting or headache or dizziness or blurred vision paresthesias or focal muscle weakness.  She denied any chest pain or palpitations.  911 was called.  When she regained consciousness she was asking why she is on the ground.  There was no reported confusion when she regained consciousness.  She denied any tinnitus or vertigo.  No urinary or stool incontinence.  No tongue biting.  No dysuria, oliguria or hematuria or flank pain.  Upon presenting to the emergency room,  blood pressure was 157/65 and otherwise vital signs were within normal.  Later blood pressure was up to 184/79.  Labs were remarkable for hyperglycemia of 350 and a BUN of 19 creatinine 1.14 compared to 19 and 0.82 last month.  High-sensitivity troponin was 4.  CBC was unremarkable.  UA showed more than 500 glucose and 11-20 WBCs with positive mucus negative nitrite negative bacteria.  Noncontrasted head CT scan revealed no acute intracranial normalities.  It showed mild left sphenoid sinus mucosal thickening and mild  general parenchymal atrophy with chronic small vessel ischemic disease.  EKG showed sinus rhythm with rate of 63 with first-degree AV block, low voltage QRS and incomplete right bundle branch block.  The patient was given 1 L bolus of IV normal saline.  She will be admitted to an observation medically monitored bed for further evaluation and management. PAST MEDICAL HISTORY:   Past Medical History:  Diagnosis Date  . Carotid artery disease ([place])   . Compression fracture    s/p fosamax, prolia  . Diabetes mellitus ([place])   . Femoral neuropathy    left, elevated CRP, ESR, negative FANA and ANCA  . Fibrocystic breast disease   . Hypercholesterolemia   . Hypertension     PAST SURGICAL HISTORY:   Past Surgical History:  Procedure Laterality Date  . APPENDECTOMY    . CHOLECYSTECTOMY    . HERNIA REPAIR    . INGUINAL HERNIA REPAIR     left x 1, right x 2  . OVARIAN CYST REMOVAL    . PARTIAL HYSTERECTOMY      SOCIAL HISTORY:   Social History   Tobacco Use  . Smoking status: Former Smoker    Quit date: 08/20/1958    Years since quitting: 61.4  . Smokeless tobacco: Never Used  Substance Use Topics  . Alcohol use: No    Alcohol/week: 0.0 standard drinks    FAMILY HISTORY:   Family History  Problem Relation Age of Onset  . Heart disease Father  myocardial infarction  . Heart disease Mother        myocardial infarction  . Diabetes Mother   . Congestive Heart Failure Mother   . Bone cancer Sister   . Diabetes Sister        x2  . Ovarian cancer Sister 67  . Heart disease Sister        CABG  . Rheum arthritis Sister   . Breast cancer Neg Hx   . Colon cancer Neg Hx     DRUG ALLERGIES:   Allergies  Allergen Reactions  . Penicillins Rash    .pcn    REVIEW OF SYSTEMS:   ROS As per history of present illness. All pertinent systems were reviewed above. Constitutional,  HEENT, cardiovascular, respiratory, GI, GU, musculoskeletal, neuro, psychiatric,  endocrine,  integumentary and hematologic systems were reviewed and are otherwise  negative/unremarkable except for positive findings mentioned above in the HPI.   MEDICATIONS AT HOME:   Prior to Admission medications   Medication Sig Start Date End Date Taking? Authorizing Provider  albuterol (PROVENTIL HFA;VENTOLIN HFA) 108 (90 Base) MCG/ACT inhaler Inhale 2 puffs into the lungs every 6 (six) hours as needed for wheezing or shortness of breath. Patient not taking: Reported on 04/01/2018 10/22/17   Einar Pheasant, MD  AMBULATORY NON FORMULARY MEDICATION Medication Name: Incentive Spirometry Use 10-15 times daily. 03/19/17   Flora Lipps, MD  amLODipine (NORVASC) 5 MG tablet TAKE 1 TABLET BY MOUTH DAILY 10/15/19   Einar Pheasant, MD  aspirin 81 MG tablet Take 81 mg by mouth daily.    [provider]  Calcium Carbonate-Vitamin D (CALCIUM 600+D) 600-400 MG-UNIT per tablet Take 1 tablet by mouth 2 (two) times daily.    [provider]  denosumab (PROLIA) 60 MG/ML SOLN injection Inject 60 mg into the skin every 6 (six) months. Administer in upper arm, thigh, or abdomen    [provider]  dicyclomine (BENTYL) 10 MG capsule TAKE 1 CAPSULE BY MOUTH 3 (THREE) TIMES DAILY AS NEEDED FOR DIARRHEA/ABDOMINAL PAIN 11/04/18   [provider]  empagliflozin (JARDIANCE) 25 MG TABS tablet Take 25 mg by mouth daily. 12/16/19   Einar Pheasant, MD  glimepiride (AMARYL) 2 MG tablet TAKE 1 TABLET BY MOUTH  TWICE DAILY 12/07/19   Einar Pheasant, MD  glucose blood (BAYER CONTOUR TEST) test strip CHECK DAILY AND AS NEEDED **E11.9** 10/22/19   Einar Pheasant, MD  meclizine (ANTIVERT) 12.5 MG tablet Take 1 tablet (12.5 mg total) by mouth 2 (two) times daily as needed for dizziness. 01/30/16   Einar Pheasant, MD  metoCLOPramide (REGLAN) 5 MG tablet Take 5 mg by mouth 2 (two) times daily.     [provider]  metoprolol tartrate (LOPRESSOR) 50 MG tablet TAKE 1 AND 1/2 TABLETS BY  MOUTH TWICE A DAY 08/06/19   Einar Pheasant, MD  omeprazole (PRILOSEC) 20 MG capsule Take 1 capsule (20 mg total) by mouth 2 (two) times daily. 04/28/19   Einar Pheasant, MD  Respiratory Therapy Supplies (FLUTTER) DEVI Use 10-15 times daily 03/19/17   Flora Lipps, MD  rosuvastatin (CRESTOR) 20 MG tablet TAKE 1 TABLET BY MOUTH EVERY DAY 12/29/19   Einar Pheasant, MD      VITAL SIGNS:  Blood pressure (!) 157/75, pulse 60, temperature 98.2 F (36.8 C), temperature source Oral, resp. rate 16, height 5' 6" (1.676 m), weight 69.9 kg, SpO2 95 %.  PHYSICAL EXAMINATION:  Physical Exam  GENERAL:  86 y.o.-year-old Caucasian female patient lying in the  bed with no acute distress.  EYES: Pupils equal, round, reactive to light and accommodation. No scleral icterus. Extraocular muscles intact.  HEENT: Head atraumatic, normocephalic. Oropharynx and nasopharynx clear.  NECK:  Supple, no jugular venous distention. No thyroid enlargement, no tenderness.  LUNGS: Normal breath sounds bilaterally, no wheezing, rales,rhonchi or crepitation. No use of accessory muscles of respiration.  CARDIOVASCULAR: Regular rate and rhythm, S1, S2 normal. No murmurs, rubs, or gallops.  ABDOMEN: Soft, nondistended, nontender. Bowel sounds present. No organomegaly or mass.  EXTREMITIES: No pedal edema, cyanosis, or clubbing.  NEUROLOGIC: Cranial nerves II through XII are intact. Muscle strength 5/5 in all extremities. Sensation intact. Gait not checked.  PSYCHIATRIC: The patient is alert and oriented x 3.  Normal affect and good eye contact. SKIN: No obvious rash, lesion, or ulcer.   LABORATORY PANEL:   CBC Recent Labs  Lab 01/24/20 1433  WBC 6.4  HGB 14.4  HCT 43.2  PLT 183   ------------------------------------------------------------------------------------------------------------------  Chemistries  Recent Labs  Lab 01/24/20 1433  NA 136  K 4.0  CL 101  CO2 24  GLUCOSE 315*  BUN 19  CREATININE 1.14*   CALCIUM 9.1   ------------------------------------------------------------------------------------------------------------------  Cardiac Enzymes No results for input(s): TROPONINI in the last 168 hours. ------------------------------------------------------------------------------------------------------------------  RADIOLOGY:  CT Head Wo Contrast  Result Date: 01/24/2020 CLINICAL DATA:  Syncope, simple, normal neuro exam. EXAM: CT HEAD WITHOUT CONTRAST TECHNIQUE: Contiguous axial images were obtained from the base of the skull through the vertex without intravenous contrast. COMPARISON:  Head CT 06/15/2009 FINDINGS: Brain: Stable, mild generalized parenchymal atrophy. Ill-defined hypoattenuation within the subcortical and deep cerebral white matter which is nonspecific, but consistent with chronic small vessel ischemic disease. There is no acute intracranial hemorrhage. No demarcated cortical infarct. No extra-axial fluid collection. No evidence of intracranial mass. No midline shift. Vascular: No hyperdense vessel.  Atherosclerotic calcifications Skull: Normal. Negative for fracture or focal lesion. Sinuses/Orbits: Visualized orbits show no acute finding. Mild left sphenoid sinus mucosal thickening at the imaged levels. No significant mastoid effusion IMPRESSION: No evidence of acute intracranial abnormality. Mild general parenchymal atrophy with chronic small vessel ischemic disease. Mild left sphenoid sinus mucosal thickening. Electronically Signed   By: Kellie Simmering DO   On: 01/24/2020 15:54      IMPRESSION AND PLAN:   1.  Syncope/unresponsiveness. -The patient will be admitted to an observation medically monitored bed. -We will follow neuro checks every 4 hours for 24 hours. -We will check her orthostatics every 12 hours. -We will obtain a 2D echo and bilateral carotid Doppler for further assessment. -She will be monitored for arrhythmias. -Differential diagnosis would include  neurally mediated given hot weather today and possible mild dehydration, arrhythmia related syncope, TIA, absence seizures and cardiogenic.  There was no documentation of hypoglycemia.  2.  Uncontrolled type diabetes mellitus with hyperglycemia. -The patient will be placed on supplement coverage with NovoLog. -We will continue her oral antidiabetics.  3.  Hypertensive urgency. -We will continue her Lopressor but cut down the dose given her first-degree AV block. -We will place her on as needed IV hydralazine.  4.  GERD. -We will continue PPI therapy.  5.  Dyslipidemia. -We will continue statin therapy.  6.  Asymptomatic bacteriuria. -We will obtain urine culture and sensitivity. -We will hold off on antibiotic therapy at this time.  7.  DVT prophylaxis. -Subcutaneous Lovenox.   All the records are reviewed and case discussed with ED provider. The plan of care was discussed  in details with the patient (and family). I answered all questions. The patient agreed to proceed with the above mentioned plan. Further management will depend upon hospital course.   CODE STATUS: Full code  Status is: Observation  The patient remains OBS appropriate and will d/c before 2 midnights.  Dispo: The patient is from: Home              Anticipated d/c is to: Home              Anticipated d/c date is: 1 day              Patient currently is not medically stable to d/c.   TOTAL TIME TAKING CARE OF THIS PATIENT: 55 minutes.    Christel Mormon M.D on 01/24/2020 at 8:35 PM  [facility]   From 7 PM-7 AM, contact night-coverage [email or web address]  CC: Primary care physician; Einar Pheasant, MD   Note: This dictation was prepared with Dragon dictation along with smaller phrase technology. Any transcriptional errors that result from this process are unintentional.

## 2020-01-24 NOTE — ED Provider Notes (Signed)
[facility] Emergency Department Provider Note  ____________________________________________   First MD Initiated Contact with Patient 01/24/20 1738     (approximate)  I have reviewed the triage vital signs and the nursing notes.  History  Chief Complaint Loss of Consciousness    HPI Christl Sireen Halk is a 86 y.o. female past medical history as below, who presents to the emergency department for an episode of decreased responsiveness and seemingly syncope.  Patient was with her family earlier today when they were having lunch outside.  She denies feeling particularly hot or dehydrated, they were under the shade and the weather was appropriate.  Her granddaughter, who is at the bedside, states all of a sudden the patient seemed to stare off into space and was unresponsive, not answering any questions.  Her eyes seem to be staring out blankly, her mouth slightly open.  No shaking activity.  Family helped lay her to the ground.  There was no fall or trauma.  This episode lasted for a few minutes, after which time the patient suddenly seemed to become more responsive and asked why she was laying on the ground.  No confusion afterwards.  Patient denies any recollection of these events.  States she was otherwise feeling well earlier today and has no acute complaints at this time.  She states a few months ago she had a similar type episode when she was driving her car when all of a sudden she seemed to a blank out - did not know where she was, did not know where she was driving, did not know her location.  This resolved quickly, she found a house that she recognized, and was able to get home.  She has not driven since then.  Denies any history of seizures or arrhythmias.  No palpitations.  No pain.  History supplemented by granddaughter at bedside who witnessed today's event.   Past Medical Hx Past Medical History:  Diagnosis Date  . Carotid artery disease ([place])   .  Compression fracture    s/p fosamax, prolia  . Diabetes mellitus ([place])   . Femoral neuropathy    left, elevated CRP, ESR, negative FANA and ANCA  . Fibrocystic breast disease   . Hypercholesterolemia   . Hypertension     Problem List Patient Active Problem List   Diagnosis Date Noted  . Lung nodule 12/14/2017  . Bronchiectasis without complication ([place]) 34/74/2595  . CAD (coronary artery disease) 03/19/2017  . Compression fracture of L1 lumbar vertebra ([place]) 03/19/2017  . Femoral neuropathy of left lower extremity 03/19/2017  . Fibrocystic breast disease 03/19/2017  . Leg pain 01/26/2017  . Atherosclerosis of native arteries of extremity with intermittent claudication ([place]) 01/26/2017  . Leukocytosis 01/18/2017  . Hilar adenopathy 01/18/2017  . Hyponatremia 01/18/2017  . Hypokalemia 01/18/2017  . Generalized weakness 01/18/2017  . Demand ischemia ([place]) 01/18/2017  . Cough 01/15/2017  . Dysuria 01/31/2016  . Dizziness 01/31/2016  . Disequilibrium 01/31/2016  . Epistaxis, recurrent 08/08/2015  . Palpitations 05/01/2015  . SOB (shortness of breath) on exertion 03/23/2015  . Chest pain with high risk for cardiac etiology 03/23/2015  . Health care maintenance 11/07/2014  . Peripheral neuropathy 03/03/2014  . Gastroparesis due to DM ([place]) 08/26/2012  . Diabetes mellitus type 2, uncomplicated ([place]) 63/87/5643  . Hypertension 08/26/2012  . Hypercholesterolemia 08/26/2012    Past Surgical Hx Past Surgical History:  Procedure Laterality Date  . APPENDECTOMY    . CHOLECYSTECTOMY    . HERNIA  REPAIR    . INGUINAL HERNIA REPAIR     left x 1, right x 2  . OVARIAN CYST REMOVAL    . PARTIAL HYSTERECTOMY      Medications Prior to Admission medications   Medication Sig Start Date End Date Taking? Authorizing Provider  albuterol (PROVENTIL HFA;VENTOLIN HFA) 108 (90 Base) MCG/ACT inhaler Inhale 2 puffs into the lungs every 6 (six) hours as needed for wheezing or shortness of  breath. Patient not taking: Reported on 04/01/2018 10/22/17   Einar Pheasant, MD  AMBULATORY NON FORMULARY MEDICATION Medication Name: Incentive Spirometry Use 10-15 times daily. 03/19/17   Flora Lipps, MD  amLODipine (NORVASC) 5 MG tablet TAKE 1 TABLET BY MOUTH DAILY 10/15/19   Einar Pheasant, MD  aspirin 81 MG tablet Take 81 mg by mouth daily.    [provider]  Calcium Carbonate-Vitamin D (CALCIUM 600+D) 600-400 MG-UNIT per tablet Take 1 tablet by mouth 2 (two) times daily.    [provider]  denosumab (PROLIA) 60 MG/ML SOLN injection Inject 60 mg into the skin every 6 (six) months. Administer in upper arm, thigh, or abdomen    [provider]  dicyclomine (BENTYL) 10 MG capsule TAKE 1 CAPSULE BY MOUTH 3 (THREE) TIMES DAILY AS NEEDED FOR DIARRHEA/ABDOMINAL PAIN 11/04/18   [provider]  empagliflozin (JARDIANCE) 25 MG TABS tablet Take 25 mg by mouth daily. 12/16/19   Einar Pheasant, MD  glimepiride (AMARYL) 2 MG tablet TAKE 1 TABLET BY MOUTH  TWICE DAILY 12/07/19   Einar Pheasant, MD  glucose blood (BAYER CONTOUR TEST) test strip CHECK DAILY AND AS NEEDED **E11.9** 10/22/19   Einar Pheasant, MD  meclizine (ANTIVERT) 12.5 MG tablet Take 1 tablet (12.5 mg total) by mouth 2 (two) times daily as needed for dizziness. 01/30/16   Einar Pheasant, MD  metoCLOPramide (REGLAN) 5 MG tablet Take 5 mg by mouth 2 (two) times daily.     [provider]  metoprolol tartrate (LOPRESSOR) 50 MG tablet TAKE 1 AND 1/2 TABLETS BY MOUTH TWICE A DAY 08/06/19   Einar Pheasant, MD  omeprazole (PRILOSEC) 20 MG capsule Take 1 capsule (20 mg total) by mouth 2 (two) times daily. 04/28/19   Einar Pheasant, MD  Respiratory Therapy Supplies (FLUTTER) DEVI Use 10-15 times daily 03/19/17   Flora Lipps, MD  rosuvastatin (CRESTOR) 20 MG tablet TAKE 1 TABLET BY MOUTH EVERY DAY 12/29/19   Einar Pheasant, MD    Allergies Penicillins  Family Hx Family History  Problem Relation Age of  Onset  . Heart disease Father        myocardial infarction  . Heart disease Mother        myocardial infarction  . Diabetes Mother   . Congestive Heart Failure Mother   . Bone cancer Sister   . Diabetes Sister        x2  . Ovarian cancer Sister 44  . Heart disease Sister        CABG  . Rheum arthritis Sister   . Breast cancer Neg Hx   . Colon cancer Neg Hx     Social Hx Social History   Tobacco Use  . Smoking status: Former Smoker    Quit date: 08/20/1958    Years since quitting: 61.4  . Smokeless tobacco: Never Used  Substance Use Topics  . Alcohol use: No    Alcohol/week: 0.0 standard drinks  . Drug use: No     Review of Systems  Constitutional: Negative for fever.  Negative for chills. Eyes: Negative for visual changes. ENT: Negative for sore throat. Cardiovascular: Negative for chest pain. Respiratory: Negative for shortness of breath. Gastrointestinal: Negative for nausea. Negative for vomiting.  Genitourinary: Negative for dysuria. Musculoskeletal: Negative for leg swelling. Skin: Negative for rash. Neurological: Negative for headaches.  Positive for episode of decreased responsiveness, possible syncope.   Physical Exam  Vital Signs: ED Triage Vitals  Enc Vitals Group     BP 01/24/20 1429 (!) 157/65     Pulse Rate 01/24/20 1429 63     Resp 01/24/20 1429 16     Temp 01/24/20 1429 98.2 F (36.8 C)     Temp Source 01/24/20 1429 Oral     SpO2 01/24/20 1429 96 %     Weight 01/24/20 1430 154 lb (69.9 kg)     Height 01/24/20 1430 5' 6"  (1.676 m)     Head Circumference --      Peak Flow --      Pain Score 01/24/20 1430 0     Pain Loc --      Pain Edu? --      Excl. in [place]? --     Constitutional: Alert and oriented. Well appearing. NAD.  Head: Normocephalic. Atraumatic. Eyes: Conjunctivae clear. Sclera anicteric. Pupils equal and symmetric. Nose: No masses or lesions. No congestion or rhinorrhea. Mouth/Throat: Wearing mask.  Neck: No stridor. Trachea  midline.  Cardiovascular: Normal rate, regular rhythm. Extremities well perfused. Respiratory: Normal respiratory effort.  Lungs CTAB. Gastrointestinal: Soft. Non-distended. Non-tender.  Genitourinary: Deferred. Musculoskeletal: No lower extremity edema. No deformities. Neurologic:  Normal speech and language. No gross focal or lateralizing neurologic deficits are appreciated.  Skin: Skin is warm, dry and intact. No rash noted. Psychiatric: Mood and affect are appropriate for situation.  EKG  Personally reviewed and interpreted by myself.   Date: 01/24/20 Time: 1434 Rate: 63 Rhythm: Sinus Axis: Normal Intervals: First-degree AV block Nonspecific ST/T wave changes No acute arrhythmias No STEMI    Radiology  Personally reviewed available imaging myself.   CT head - IMPRESSION:  No evidence of acute intracranial abnormality.   Mild general parenchymal atrophy with chronic small vessel ischemic  disease.   Mild left sphenoid sinus mucosal thickening.    Procedures  Procedure(s) performed (including critical care):  Procedures   Initial Impression / Assessment and Plan / MDM / ED Course  86 y.o. female who presents to the ED for an episode of decreased responsiveness today, questionable syncope.  Further detailed history as above.  Ddx: heat syncope, heat exhaustion, dehydration, absence seizure, arrhythmia  Will plan for labs, imaging, cardiac monitoring  EKG as above, notable for nonspecific findings, and also first-degree AV block, but no evidence of acute ischemia or acute arrhythmia.  Labs reveal slight dehydration, received IV fluids and encourage by mouth.  Initial troponin negative.  CT head without acute abnormalities.  No obvious etiology for the patient's presentation today.  However, given this is the second type episode that has occurred, recommended admission for further evaluation, possible Neurology evaluation and at a minimum cardiac telemetry  monitoring.  Patient and family are agreeable.  Will admit.   _______________________________   As part of my medical decision making I have reviewed available labs, radiology tests, reviewed old records/performed chart review, obtained additional history from family.   Final Clinical Impression(s) / ED Diagnosis  Episode of decreased responsiveness Loss of consciousness     Note:  This document was prepared using Dragon voice recognition software  and may include unintentional dictation errors.   Lilia Pro., MD 01/25/20 [phone]

## 2020-01-24 NOTE — ED Notes (Signed)
Per granddaughter pt was at church and eyes were open and pt was staring off but not responding.  Pt mouth was open during this.  Would not answer questions.  Took patient 3-5 for patient to respond.  Patient was confused about being on the ground when came around.  Felt lightheaded after and dizziness.  Also c/o headache after per granddaughter.

## 2020-01-24 NOTE — ED Notes (Signed)
Returned from CT.

## 2020-01-24 NOTE — ED Notes (Signed)
Meal provided 

## 2020-01-25 ENCOUNTER — Observation Stay: Payer: Medicare Other

## 2020-01-25 ENCOUNTER — Observation Stay
Admit: 2020-01-25 | Discharge: 2020-01-25 | Disposition: A | Discharge status: Home / Self Care | Payer: Medicare Other | Attending: Family Medicine | Admitting: Family Medicine

## 2020-01-25 ENCOUNTER — Telehealth: Payer: Self-pay | Admitting: Internal Medicine

## 2020-01-25 DIAGNOSIS — I441 Atrioventricular block, second degree: Secondary | ICD-10-CM | POA: Diagnosis not present

## 2020-01-25 DIAGNOSIS — R55 Syncope and collapse: Secondary | ICD-10-CM | POA: Diagnosis not present

## 2020-01-25 DIAGNOSIS — I6523 Occlusion and stenosis of bilateral carotid arteries: Secondary | ICD-10-CM | POA: Diagnosis not present

## 2020-01-25 LAB — BASIC METABOLIC PANEL
Anion gap: 8 (ref 5–15)
BUN: 20 mg/dL (ref 8–23)
CO2: 26 mmol/L (ref 22–32)
Calcium: 8.5 mg/dL — ABNORMAL LOW (ref 8.9–10.3)
Chloride: 105 mmol/L (ref 98–111)
Creatinine, Ser: 0.79 mg/dL (ref 0.44–1.00)
GFR calc Af Amer: >60 mL/min (ref >60)
GFR calc non Af Amer: >60 mL/min (ref >60)
Glucose, Bld: 203 mg/dL — ABNORMAL HIGH (ref 70–99)
Potassium: 3.6 mmol/L (ref 3.5–5.1)
Sodium: 139 mmol/L (ref 135–145)

## 2020-01-25 LAB — CBC
HCT: 38 % (ref 36.0–46.0)
Hemoglobin: 13 g/dL (ref 12.0–15.0)
MCH: 30 pg (ref 26.0–34.0)
MCHC: 34.2 g/dL (ref 30.0–36.0)
MCV: 87.8 fL (ref 80.0–100.0)
Platelets: 152 10*3/uL (ref 150–400)
RBC: 4.33 MIL/uL (ref 3.87–5.11)
RDW: 14 % (ref 11.5–15.5)
WBC: 6.8 10*3/uL (ref 4.0–10.5)
nRBC: 0 % (ref 0.0–0.2)

## 2020-01-25 LAB — GLUCOSE, CAPILLARY
Glucose-Capillary: 105 mg/dL — ABNORMAL HIGH (ref 70–99)
Glucose-Capillary: 133 mg/dL — ABNORMAL HIGH (ref 70–99)
Glucose-Capillary: 150 mg/dL — ABNORMAL HIGH (ref 70–99)
Glucose-Capillary: 213 mg/dL — ABNORMAL HIGH (ref 70–99)

## 2020-01-25 LAB — ECHOCARDIOGRAM COMPLETE
Height: 66 in
Weight: 2464 oz

## 2020-01-25 LAB — TROPONIN I (HIGH SENSITIVITY): Troponin I (High Sensitivity): 5 ng/L (ref <18)

## 2020-01-25 NOTE — Consult Note (Signed)
[facility] Card[facility] Consultation Note  Patient ID: Debra Kirk, MRN: [id number], DOB/AGE: [birth year] 84 y.o. Admit date: 01/24/2020   Date of Consult: 01/25/2020 Primary Physician: Einar Pheasant, MD Primary Cardiologist: None  Chief Complaint:  Chief Complaint  Patient presents with  . Loss of Consciousness   Reason for Consult: Syncope  HPI: 86 y.o. female with known hypertension hyperlipidemia diabetes and apparent carotid atherosclerosis who has had 2 episodes of syncope in the last several months.  1 episode was when she was driving and had some difficulty knowing where she was and then realized where she was and was able to finish driving and getting home.  The second episode was when she was at a reunion when she felt kind of dizzy and weak and then the next thing she knew she was on the floor.  She did not hurt her self and was let down by her family members.  She was in a daze but never lost any blood pressure.  With this the patient was seen in the emergency room and since then has had an EKG showing sinus bradycardia with first-degree AV block.  This has changed to first-degree AV block and occasional second-degree type I AV block.  There is been no evidence of symptomatic bradycardia during this hospitalization.  The patient did have metoprolol dose prior to admission to the hospital for which now is not being used and possibly could have had symptomatic bradycardia.  Currently blood pressure is well controlled and she feels well with no evidence of myocardial infarction or congestive heart failure.  Currently it does appear that she may have some type of rhythm disturbance needing further evaluation although does does not need further hospitalization for that  Past Medical History:  Diagnosis Date  . Carotid artery disease ([place])   . Compression fracture    s/p fosamax, prolia  . Diabetes mellitus ([place])   . Femoral neuropathy    left, elevated CRP, ESR, negative FANA and  ANCA  . Fibrocystic breast disease   . Hypercholesterolemia   . Hypertension       Surgical History:  Past Surgical History:  Procedure Laterality Date  . APPENDECTOMY    . CHOLECYSTECTOMY    . HERNIA REPAIR    . INGUINAL HERNIA REPAIR     left x 1, right x 2  . OVARIAN CYST REMOVAL    . PARTIAL HYSTERECTOMY       Home Meds: Prior to Admission medications   Medication Sig Start Date End Date Taking? Authorizing Provider  amLODipine (NORVASC) 5 MG tablet TAKE 1 TABLET BY MOUTH DAILY Patient taking differently: Take 5 mg by mouth daily.  10/15/19  Yes Einar Pheasant, MD  aspirin 81 MG EC tablet Take 81 mg by mouth daily.    Yes [provider]  Calcium Carbonate-Vitamin D (CALCIUM 600+D) 600-400 MG-UNIT per tablet Take 1 tablet by mouth 2 (two) times daily.   Yes [provider]  empagliflozin (JARDIANCE) 25 MG TABS tablet Take 25 mg by mouth daily. 12/16/19  Yes Einar Pheasant, MD  glimepiride (AMARYL) 2 MG tablet TAKE 1 TABLET BY MOUTH  TWICE DAILY Patient taking differently: Take 2 mg by mouth 2 (two) times daily with a meal.  12/07/19  Yes Einar Pheasant, MD  metoCLOPramide (REGLAN) 5 MG tablet Take 5 mg by mouth 2 (two) times daily.    Yes [provider]  metoprolol tartrate (LOPRESSOR) 50 MG tablet TAKE 1 AND 1/2 TABLETS BY MOUTH  TWICE A DAY Patient taking differently: Take 75 mg by mouth 2 (two) times daily. TAKE 1 AND 1/2 TABLETS BY MOUTH TWICE A DAY 08/06/19  Yes Einar Pheasant, MD  omeprazole (PRILOSEC) 20 MG capsule Take 1 capsule (20 mg total) by mouth 2 (two) times daily. 04/28/19  Yes Einar Pheasant, MD  rosuvastatin (CRESTOR) 20 MG tablet TAKE 1 TABLET BY MOUTH EVERY DAY Patient taking differently: Take 20 mg by mouth daily.  12/29/19  Yes Einar Pheasant, MD  albuterol (PROVENTIL HFA;VENTOLIN HFA) 108 (90 Base) MCG/ACT inhaler Inhale 2 puffs into the lungs every 6 (six) hours as needed for wheezing or shortness of breath. Patient not taking:  Reported on 04/01/2018 10/22/17   Einar Pheasant, MD  AMBULATORY NON FORMULARY MEDICATION Medication Name: Incentive Spirometry Use 10-15 times daily. 03/19/17   Flora Lipps, MD  denosumab (PROLIA) 60 MG/ML SOLN injection Inject 60 mg into the skin every 6 (six) months. Administer in upper arm, thigh, or abdomen    [provider]  dicyclomine (BENTYL) 10 MG capsule Take 10 mg by mouth 3 (three) times daily before meals.  11/04/18   [provider]  glucose blood (BAYER CONTOUR TEST) test strip CHECK DAILY AND AS NEEDED **E11.9** 10/22/19   Einar Pheasant, MD  meclizine (ANTIVERT) 12.5 MG tablet Take 1 tablet (12.5 mg total) by mouth 2 (two) times daily as needed for dizziness. 01/30/16   Einar Pheasant, MD  Respiratory Therapy Supplies (FLUTTER) DEVI Use 10-15 times daily 03/19/17   Flora Lipps, MD    Inpatient Medications:  . amLODipine  5 mg Oral Daily  . aspirin EC  81 mg Oral Daily  . calcium-vitamin D  1 tablet Oral BID  . dicyclomine  10 mg Oral TID AC  . enoxaparin (LOVENOX) injection  40 mg Subcutaneous Q24H  . insulin aspart  0-9 Units Subcutaneous Q4H  . metoCLOPramide  5 mg Oral BID  . pantoprazole  40 mg Oral Daily  . rosuvastatin  20 mg Oral Daily  . sodium chloride flush  3 mL Intravenous Q12H   . sodium chloride 100 mL/hr at 01/25/20 0303    Allergies:  Allergies  Allergen Reactions  . Penicillins Rash    .pcn    Social History   Socioeconomic History  . Marital status: Widowed    Spouse name: Not on file  . Number of children: 3  . Years of education: Not on file  . Highest education level: Not on file  Occupational History  . Not on file  Tobacco Use  . Smoking status: Former Smoker    Quit date: 08/20/1958    Years since quitting: 61.4  . Smokeless tobacco: Never Used  Substance and Sexual Activity  . Alcohol use: No    Alcohol/week: 0.0 standard drinks  . Drug use: No  . Sexual activity: Never  Other Topics Concern  . Not on file   Social History Narrative  . Not on file   Social Determinants of Health   Financial Resource Strain:   . Difficulty of Paying Living Expenses:   Food Insecurity:   . Worried About Charity fundraiser in the Last Year:   . Arboriculturist in the Last Year:   Transportation Needs:   . Film/video editor (Medical):   Marland Kitchen Lack of Transportation (Non-Medical):   Physical Activity:   . Days of Exercise per Week:   . Minutes of Exercise per Session:   Stress:   . Feeling  of Stress :   Social Connections:   . Frequency of Communication with Friends and Family:   . Frequency of Social Gatherings with Friends and Family:   . Attends Religious Services:   . Active Member of Clubs or Organizations:   . Attends Archivist Meetings:   Marland Kitchen Marital Status:   Intimate Partner Violence:   . Fear of Current or Ex-Partner:   . Emotionally Abused:   Marland Kitchen Physically Abused:   . Sexually Abused:      Family History  Problem Relation Age of Onset  . Heart disease Father        myocardial infarction  . Heart disease Mother        myocardial infarction  . Diabetes Mother   . Congestive Heart Failure Mother   . Bone cancer Sister   . Diabetes Sister        x2  . Ovarian cancer Sister 3  . Heart disease Sister        CABG  . Rheum arthritis Sister   . Breast cancer Neg Hx   . Colon cancer Neg Hx      Review of Systems Positive for syncope Negative for: General:  chills, fever, night sweats or weight changes.  Cardiovascular: PND orthopnea positive for syncope dizziness  Dermatological skin lesions rashes Respiratory: Cough congestion Urologic: Frequent urination urination at night and hematuria Abdominal: negative for nausea, vomiting, diarrhea, bright red blood per rectum, melena, or hematemesis Neurologic: negative for visual changes, and/or hearing changes  All other systems reviewed and are otherwise negative except as noted above.  Labs: No results for input(s):  CKTOTAL, CKMB, TROPONINI in the last 72 hours. Lab Results  Component Value Date   WBC 6.8 01/25/2020   HGB 13.0 01/25/2020   HCT 38.0 01/25/2020   MCV 87.8 01/25/2020   PLT 152 01/25/2020    Recent Labs  Lab 01/25/20 0000  NA 139  K 3.6  CL 105  CO2 26  BUN 20  CREATININE 0.79  CALCIUM 8.5*  GLUCOSE 203*   Lab Results  Component Value Date   CHOL 164 12/09/2019   HDL 51.10 12/09/2019   LDLCALC 91 12/09/2019   TRIG 107.0 12/09/2019   No results found for: DDIMER  Radiology/Studies:  CT Head Wo Contrast  Result Date: 01/24/2020 CLINICAL DATA:  Syncope, simple, normal neuro exam. EXAM: CT HEAD WITHOUT CONTRAST TECHNIQUE: Contiguous axial images were obtained from the base of the skull through the vertex without intravenous contrast. COMPARISON:  Head CT 06/15/2009 FINDINGS: Brain: Stable, mild generalized parenchymal atrophy. Ill-defined hypoattenuation within the subcortical and deep cerebral white matter which is nonspecific, but consistent with chronic small vessel ischemic disease. There is no acute intracranial hemorrhage. No demarcated cortical infarct. No extra-axial fluid collection. No evidence of intracranial mass. No midline shift. Vascular: No hyperdense vessel.  Atherosclerotic calcifications Skull: Normal. Negative for fracture or focal lesion. Sinuses/Orbits: Visualized orbits show no acute finding. Mild left sphenoid sinus mucosal thickening at the imaged levels. No significant mastoid effusion IMPRESSION: No evidence of acute intracranial abnormality. Mild general parenchymal atrophy with chronic small vessel ischemic disease. Mild left sphenoid sinus mucosal thickening. Electronically Signed   By: Kellie Simmering DO   On: 01/24/2020 15:54   US Carotid Bilateral  Result Date: 01/25/2020 CLINICAL DATA:  Syncope EXAM: BILATERAL CAROTID DUPLEX ULTRASOUND TECHNIQUE: Pearline Cables scale imaging, color Doppler and duplex ultrasound were performed of bilateral carotid and vertebral  arteries in the neck. COMPARISON:  None.  FINDINGS: Criteria: Quantification of carotid stenosis is based on velocity parameters that correlate the residual internal carotid diameter with NASCET-based stenosis levels, using the diameter of the distal internal carotid lumen as the denominator for stenosis measurement. The following velocity measurements were obtained: RIGHT ICA: 91/23 cm/sec CCA: 49/67 cm/sec SYSTOLIC ICA/CCA RATIO:  1.2 ECA: 94 cm/sec LEFT ICA: 208/23 cm/sec CCA: 59/16 cm/sec SYSTOLIC ICA/CCA RATIO:  3.2 ECA: 201 cm/sec RIGHT CAROTID ARTERY: Minor echogenic shadowing plaque formation. No hemodynamically significant right ICA stenosis, velocity elevation, or turbulent flow. Degree of narrowing less than 50%. RIGHT VERTEBRAL ARTERY:  Normal antegrade flow LEFT CAROTID ARTERY: Moderate bifurcation calcific atherosclerosis. Proximal ICA velocity elevation measures up to 208/23 centimeters/second. Mild turbulent flow noted. Left ICA stenosis is estimated at 50-69% by ultrasound criteria. LEFT VERTEBRAL ARTERY:  Normal antegrade flow IMPRESSION: Right ICA narrowing less than 50% Moderate left ICA stenosis estimated at 50-69% Patent antegrade vertebral flow bilaterally Electronically Signed   By: Jerilynn Mages.  Shick M.D.   On: 01/25/2020 14:00    EKG: Normal sinus rhythm with first-degree AV block  Weights: Filed Weights   01/24/20 1430  Weight: 69.9 kg     Physical Exam: Blood pressure (!) 172/69, pulse 62, temperature 97.8 F (36.6 C), temperature source Oral, resp. rate 18, height 5' 6"  (1.676 m), weight 69.9 kg, SpO2 94 %. Body mass index is 24.86 kg/m. General: Well developed, well nourished, in no acute distress. Head eyes ears nose throat: Normocephalic, atraumatic, sclera non-icteric, no xanthomas, nares are without discharge. No apparent thyromegaly and/or mass  Lungs: Normal respiratory effort.  no wheezes, no rales, no rhonchi.  Heart: RRR with normal S1 S2. no murmur gallop, no rub, PMI  is normal size and placement, carotid upstroke normal without bruit, jugular venous pressure is normal Abdomen: Soft, non-tender, non-distended with normoactive bowel sounds. No hepatomegaly. No rebound/guarding. No obvious abdominal masses. Abdominal aorta is normal size without bruit Extremities: No edema. no cyanosis, no clubbing, no ulcers  Peripheral : 2+ bilateral upper extremity pulses, 2+ bilateral femoral pulses, 2+ bilateral dorsal pedal pulse Neuro: Alert and oriented. No facial asymmetry. No focal deficit. Moves all extremities spontaneously. Musculoskeletal: Normal muscle tone without kyphosis Psych:  Responds to questions appropriately with a normal affect.    Assessment: 86 year old female with hypertension hyperlipidemia diabetes having a syncopal episode of unknown etiology with some first-degree AV block but no evidence of advanced heart block as a primary cause.  There is no evidence of congestive heart failure or angina at this time  Plan: 1.  Discontinuation and avoid beta-blocker due to possible advanced heart block and/or symptomatic bradycardia 2.  Continue hypertension control with hydralazine amlodipine combination 3.  Continue high intensity cholesterol therapy for previous cardiovascular disease risk 4.  Begin ambulation and follow for improvements of symptoms and okay for discharged home if ambulating well with follow-up on Friday for further evaluation and treatment options including possible long-term monitor  Signed, Corey Skains M.D. [facility] 01/25/2020, 5:17 PM

## 2020-01-25 NOTE — Telephone Encounter (Signed)
Noted  

## 2020-01-25 NOTE — Plan of Care (Signed)
  Problem: Education: Goal: Knowledge of [facility] Education information/materials will improve Outcome: Adequate for Discharge Goal: Emotional status will improve Outcome: Adequate for Discharge Goal: Mental status will improve Outcome: Adequate for Discharge Goal: Verbalization of understanding the information provided will improve Outcome: Adequate for Discharge   Problem: Activity: Goal: Interest or engagement in activities will improve Outcome: Adequate for Discharge Goal: Sleeping patterns will improve Outcome: Adequate for Discharge   Problem: Coping: Goal: Ability to verbalize frustrations and anger appropriately will improve Outcome: Adequate for Discharge Goal: Ability to demonstrate self-control will improve Outcome: Adequate for Discharge   Problem: Health Behavior/Discharge Planning: Goal: Identification of resources available to assist in meeting health care needs will improve Outcome: Adequate for Discharge Goal: Compliance with treatment plan for underlying cause of condition will improve Outcome: Adequate for Discharge   Problem: Physical Regulation: Goal: Ability to maintain clinical measurements within normal limits will improve Outcome: Adequate for Discharge   Problem: Safety: Goal: Periods of time without injury will increase Outcome: Adequate for Discharge   

## 2020-01-25 NOTE — Evaluation (Signed)
Physical Therapy Evaluation Patient Details Name: Debra Kirk MRN: [id number] DOB: Sep 23, 1929 Today's Date: 01/25/2020   History of Present Illness  Pt is an 86 y.o. caucasian female with a known history of type diabetes mellitus, dyslipidemia, hypertension carotid artery disease, presented to the emergency room with acute onset of unresponsiveness.  MD assessment includes: Syncope/unresponsiveness, uncontrolled DM, HTN, and asymptomatic bacteriuria.    Clinical Impression  Pt pleasant and motivated to participate during the session.  Pt required no physical assistance during the session and was Ind or Mod Ind with all functional tasks.  Pt was steady with amb with a SPC including with head turns and start/stops with no adverse symptoms reported and with SpO2 and HR WNL throughout. Pt reports feeling back to baseline with no skilled PT need identified at this time.  Will complete PT orders at this time but will reassess pt pending a change in status upon receipt of new PT orders.      Follow Up Recommendations No PT follow up    Equipment Recommendations  None recommended by PT    Recommendations for Other Services       Precautions / Restrictions Precautions Precautions: None Restrictions Weight Bearing Restrictions: No      Mobility  Bed Mobility Overal bed mobility: Independent                Transfers Overall transfer level: Independent               General transfer comment: Good eccentric and concentric control  Ambulation/Gait Ambulation/Gait assistance: Modified independent (Device/Increase time) Gait Distance (Feet): 200 Feet Assistive device: Straight cane Gait Pattern/deviations: Step-through pattern;Decreased step length - right;Decreased step length - left Gait velocity: decreased   General Gait Details: Good control and stability including with head turn and start/stops  Stairs            Wheelchair Mobility    Modified Rankin  (Stroke Patients Only)       Balance Overall balance assessment: Modified Independent                                           Pertinent Vitals/Pain Pain Assessment: No/denies pain    Home Living Family/patient expects to be discharged to:: Private residence Living Arrangements: Alone Available Help at Discharge: Family;Available PRN/intermittently Type of Home: House Home Access: Stairs to enter Entrance Stairs-Rails: None Entrance Stairs-Number of Steps: 2 Home Layout: One level Home Equipment: Walker - 4 wheels;Cane - single point      Prior Function Level of Independence: Independent         Comments: Pt ambulates without an AD in the home and with a SPC in the community, no fall history, Ind with ADLs     Hand Dominance        Extremity/Trunk Assessment   Upper Extremity Assessment Upper Extremity Assessment: Overall WFL for tasks assessed    Lower Extremity Assessment Lower Extremity Assessment: Overall WFL for tasks assessed       Communication   Communication: No difficulties  Cognition Arousal/Alertness: Awake/alert Behavior During Therapy: WFL for tasks assessed/performed Overall Cognitive Status: Within Functional Limits for tasks assessed  General Comments      Exercises     Assessment/Plan    PT Assessment Patent does not need any further PT services  PT Problem List         PT Treatment Interventions      PT Goals (Current goals can be found in the Care Plan section)  Acute Rehab PT Goals PT Goal Formulation: All assessment and education complete, DC therapy    Frequency     Barriers to discharge        Co-evaluation               AM-PAC PT "6 Clicks" Mobility  Outcome Measure Help needed turning from your back to your side while in a flat bed without using bedrails?: None Help needed moving from lying on your back to sitting on the side of  a flat bed without using bedrails?: None Help needed moving to and from a bed to a chair (including a wheelchair)?: None Help needed standing up from a chair using your arms (e.g., wheelchair or bedside chair)?: None Help needed to walk in hospital room?: None Help needed climbing 3-5 steps with a railing? : None 6 Click Score: 24    End of Session Equipment Utilized During Treatment: Gait belt Activity Tolerance: Patient tolerated treatment well Patient left: in chair;with call bell/phone within reach;with family/visitor present;with chair alarm set Nurse Communication: Mobility status PT Visit Diagnosis: Muscle weakness (generalized) (M62.81)    Time: 8159-4707 PT Time Calculation (min) (ACUTE ONLY): 23 min   Charges:   PT Evaluation $PT Eval Low Complexity: 1 Low         D. Scott Damier Disano PT, DPT 01/25/20, 11:45 AM

## 2020-01-25 NOTE — Plan of Care (Signed)
  Problem: Activity: Goal: Interest or engagement in activities will improve Outcome: Progressing Goal: Sleeping patterns will improve Outcome: Progressing   Problem: Education: Goal: Knowledge of [place] [facility] Education information/materials will improve Outcome: Progressing Goal: Emotional status will improve Outcome: Progressing Goal: Mental status will improve Outcome: Progressing Goal: Verbalization of understanding the information provided will improve Outcome: Progressing   

## 2020-01-25 NOTE — Telephone Encounter (Signed)
[facility] called to schedule hospital follow up for repeated fainting. Scheduled for 12 on 01/28/20. Pt being discharged today

## 2020-01-25 NOTE — Discharge Instructions (Signed)
#  1.  Follow-up with PCP in 1 week. 2. Follow-up with your own cardiologist in 2 weeks.

## 2020-01-25 NOTE — Progress Notes (Signed)
*  PRELIMINARY RESULTS* Echocardiogram 2D Echocardiogram has been performed.  Debra Kirk 01/25/2020, 9:11 AM

## 2020-01-25 NOTE — Discharge Summary (Addendum)
Physician Discharge Summary  Patient ID: Debra Kirk MRN: [id number] DOB/AGE: May 16, 1930 86 y.o.  Admit date: 01/24/2020 Discharge date: 01/25/2020  Admission Diagnoses:  Discharge Diagnoses:  Active Problems:   Syncope Uncontrolled type 2 diabetes with hyperglycemia Hypertensive urgency Dyslipidemia Asymptomatic bacteriuria GERD. Second degree AV block with Wenckebach.  Discharged Condition: good  Hospital Course:  Debra Kirk  is a 86 y.o. Caucasian female with a known history of type diabetes mellitus, dyslipidemia, hypertension carotid artery disease, presented to the emergency room with acute onset of unresponsiveness while opening her eyes and staring.  The patient was having lunch outside of her church today with her family and suddenly became unresponsive while staring and looking into the space and opening her mouth.  That lasted about 4 minutes. Patient does not have any urinary symptoms suggest UTI.  EKG showed first-degree AV block and incomplete right bundle branch block.  Not have any hypoglycemia at time of admission.  He received IV fluid bolus, she did not have any symptoms since arriving the hospital.  Telemetry monitor also showed first-degree AV block.  Her syncope most likely due to a vasovagal reaction.  Cannot rule out higher degree of AV block due to first-degree AV block.  However, cardiac monitor so far has not received any arrhythmia or bradycardia.  She did not have a heart murmur, she probably will not have a valvular heart disease.  At this point, she will be discharged home.  We will set up a event recorder.  Patient can be followed by her cardiologist and the family doctor in the near future.  1540 Hours.  Telemetry showed second degree AV block (Wenckebach) alternating with first-degree AV block.  Metoprolol discontinued.  Discussed with Dr.Kowaski, patient can be discharged home today.  She will follow up with him on Friday.  Consults:  None  Significant Diagnostic Studies:  CT HEAD WITHOUT CONTRAST  TECHNIQUE: Contiguous axial images were obtained from the base of the skull through the vertex without intravenous contrast.  COMPARISON:  Head CT 06/15/2009  FINDINGS: Brain:  Stable, mild generalized parenchymal atrophy.  Ill-defined hypoattenuation within the subcortical and deep cerebral white matter which is nonspecific, but consistent with chronic small vessel ischemic disease.  There is no acute intracranial hemorrhage.  No demarcated cortical infarct.  No extra-axial fluid collection.  No evidence of intracranial mass.  No midline shift.  Vascular: No hyperdense vessel.  Atherosclerotic calcifications  Skull: Normal. Negative for fracture or focal lesion.  Sinuses/Orbits: Visualized orbits show no acute finding. Mild left sphenoid sinus mucosal thickening at the imaged levels. No significant mastoid effusion  IMPRESSION: No evidence of acute intracranial abnormality.  Mild general parenchymal atrophy with chronic small vessel ischemic disease.  Mild left sphenoid sinus mucosal thickening.   Electronically Signed   By: Kellie Simmering DO   On: 01/24/2020 15:54  BILATERAL CAROTID DUPLEX ULTRASOUND  TECHNIQUE: Pearline Cables scale imaging, color Doppler and duplex ultrasound were performed of bilateral carotid and vertebral arteries in the neck.  COMPARISON:  None.  FINDINGS: Criteria: Quantification of carotid stenosis is based on velocity parameters that correlate the residual internal carotid diameter with NASCET-based stenosis levels, using the diameter of the distal internal carotid lumen as the denominator for stenosis measurement.  The following velocity measurements were obtained:  RIGHT  ICA: 91/23 cm/sec  CCA: 60/73 cm/sec  SYSTOLIC ICA/CCA RATIO:  1.2  ECA: 94 cm/sec  LEFT  ICA: 208/23 cm/sec  CCA: 71/06 cm/sec  SYSTOLIC ICA/CCA RATIO:  3.2  ECA: 201 cm/sec  RIGHT CAROTID ARTERY: Minor echogenic shadowing plaque formation. No hemodynamically significant right ICA stenosis, velocity elevation, or turbulent flow. Degree of narrowing less than 50%.  RIGHT VERTEBRAL ARTERY:  Normal antegrade flow  LEFT CAROTID ARTERY: Moderate bifurcation calcific atherosclerosis. Proximal ICA velocity elevation measures up to 208/23 centimeters/second. Mild turbulent flow noted. Left ICA stenosis is estimated at 50-69% by ultrasound criteria.  LEFT VERTEBRAL ARTERY:  Normal antegrade flow  IMPRESSION: Right ICA narrowing less than 50%  Moderate left ICA stenosis estimated at 50-69%  Patent antegrade vertebral flow bilaterally   Electronically Signed   By: Jerilynn Mages.  Shick M.D.   On: 01/25/2020 14:00     Treatments: Telemetry monitor  Discharge Exam: Blood pressure (!) 172/69, pulse 62, temperature 97.8 F (36.6 C), temperature source Oral, resp. rate 18, height 5\' 6"  (1.676 m), weight 69.9 kg, SpO2 94 %. General appearance: alert and cooperative Resp: clear to auscultation bilaterally Cardio: regular rate and rhythm, S1, S2 normal, no murmur, click, rub or gallop GI: soft, non-tender; bowel sounds normal; no masses,  no organomegaly Extremities: extremities normal, atraumatic, no cyanosis or edema  Disposition: Discharge disposition: 01-Home or Self Care       Discharge Instructions    Diet - low sodium heart healthy   Complete by: As directed    Increase activity slowly   Complete by: As directed      Allergies as of 01/25/2020      Reactions   Penicillins Rash   .pcn      Medication List    STOP taking these medications   metoprolol tartrate 50 MG tablet Commonly known as: LOPRESSOR     TAKE these medications   albuterol 108 (90 Base) MCG/ACT inhaler Commonly known as: VENTOLIN HFA Inhale 2 puffs into the lungs every 6 (six) hours as needed for wheezing or shortness of breath.   AMBULATORY  NON FORMULARY MEDICATION Medication Name: Incentive Spirometry Use 10-15 times daily.   amLODipine 5 MG tablet Commonly known as: NORVASC TAKE 1 TABLET BY MOUTH DAILY   aspirin 81 MG EC tablet Take 81 mg by mouth daily.   Calcium 600+D 600-400 MG-UNIT tablet Generic drug: Calcium Carbonate-Vitamin D Take 1 tablet by mouth 2 (two) times daily.   denosumab 60 MG/ML Soln injection Commonly known as: PROLIA Inject 60 mg into the skin every 6 (six) months. Administer in upper arm, thigh, or abdomen   dicyclomine 10 MG capsule Commonly known as: BENTYL Take 10 mg by mouth 3 (three) times daily before meals.   Flutter Devi Use 10-15 times daily   glimepiride 2 MG tablet Commonly known as: AMARYL TAKE 1 TABLET BY MOUTH  TWICE DAILY What changed: when to take this   glucose blood test strip Commonly known as: Electrical engineer DAILY AND AS NEEDED **E11.9**   Jardiance 25 MG Tabs tablet Generic drug: empagliflozin Take 25 mg by mouth daily.   meclizine 12.5 MG tablet Commonly known as: ANTIVERT Take 1 tablet (12.5 mg total) by mouth 2 (two) times daily as needed for dizziness.   metoCLOPramide 5 MG tablet Commonly known as: REGLAN Take 5 mg by mouth 2 (two) times daily.   omeprazole 20 MG capsule Commonly known as: PRILOSEC Take 1 capsule (20 mg total) by mouth 2 (two) times daily.   rosuvastatin 20 MG tablet Commonly known as: CRESTOR TAKE 1 TABLET BY MOUTH EVERY DAY      Follow-up Information    Sims,  Randell Patient, MD. Daphane Shepherd on 01/28/2020.   Specialty: Internal Medicine Why: at 12:00 PM Contact information: [address] [place] [address] [phone]           Signed: Sharen Hones 01/25/2020, 5:38 PM

## 2020-01-26 ENCOUNTER — Telehealth: Payer: Self-pay | Admitting: Internal Medicine

## 2020-01-26 LAB — URINE CULTURE: Culture: >=100000 — AB

## 2020-01-26 NOTE — Telephone Encounter (Signed)
Opened in error

## 2020-01-27 ENCOUNTER — Telehealth: Payer: Self-pay

## 2020-01-27 NOTE — Telephone Encounter (Signed)
Transition Care Management Follow-up Telephone Call  Date of discharge and from where: 01/25/20 from [facility].  How have you been since you were released from the hospital? Tired with no appetite yesterday but feels better today. Patient states she feels like she is just about at her baseline, eating better and rested. Denies dizziness, headache, weakness, numbness, chest pain, nausea, diarrhea, vomiting and all other symptoms. Cane in use when ambulating.   Any questions or concerns? Would like to discuss possible follow up with Neurology. Cardiology and PCP follow up recommended per discharge only.   Items Reviewed:  Did the pt receive and understand the discharge instructions provided? Yes, increase activity as tolerated and drink plenty of fluids.   Medications obtained and verified? Stop metoprolol tartrate. Take all other scheduled medications as directed.  Any new allergies since your discharge? No  Dietary orders reviewed? Yes, low sodium heart healthy  Do you have support at home? Yes, daughter   Functional Questionnaire: (I = Independent and D = Dependent) ADLs: i  Bathing/Dressing- i  Meal Prep- i  Eating- i  Maintaining continence- i  Transferring/Ambulation- cane  Managing Meds- i  Follow up appointments reviewed:   PCP Hospital f/u appt confirmed? Scheduled to see Dr. Nicki Reaper on 01/28/20 @ 12:00.  [facility] f/u appt confirmed? Scheduled to see Cardiologist on 01/29/20.  Are transportation arrangements needed? No.  If their condition worsens, is the pt aware to call PCP or go to the Emergency Dept.? Yes  Was the patient provided with contact information for the PCP's office or ED? Yes  Was to pt encouraged to call back with questions or concerns? Yes

## 2020-01-28 ENCOUNTER — Encounter: Payer: Self-pay | Admitting: Internal Medicine

## 2020-01-28 ENCOUNTER — Other Ambulatory Visit: Payer: Self-pay

## 2020-01-28 ENCOUNTER — Ambulatory Visit (INDEPENDENT_AMBULATORY_CARE_PROVIDER_SITE_OTHER): Payer: Medicare Other | Admitting: Internal Medicine

## 2020-01-28 VITALS — BP 146/78 | HR 89 | Temp 97.2°F | Resp 16 | Ht 66.0 in | Wt 151.8 lb

## 2020-01-28 DIAGNOSIS — R4020 Unspecified coma: Secondary | ICD-10-CM

## 2020-01-28 DIAGNOSIS — G6289 Other specified polyneuropathies: Secondary | ICD-10-CM | POA: Diagnosis not present

## 2020-01-28 DIAGNOSIS — I70213 Atherosclerosis of native arteries of extremities with intermittent claudication, bilateral legs: Secondary | ICD-10-CM

## 2020-01-28 DIAGNOSIS — I10 Essential (primary) hypertension: Secondary | ICD-10-CM

## 2020-01-28 DIAGNOSIS — E7800 Pure hypercholesterolemia, unspecified: Secondary | ICD-10-CM

## 2020-01-28 DIAGNOSIS — I2510 Atherosclerotic heart disease of native coronary artery without angina pectoris: Secondary | ICD-10-CM

## 2020-01-28 DIAGNOSIS — J479 Bronchiectasis, uncomplicated: Secondary | ICD-10-CM

## 2020-01-28 DIAGNOSIS — R944 Abnormal results of kidney function studies: Secondary | ICD-10-CM

## 2020-01-28 DIAGNOSIS — E119 Type 2 diabetes mellitus without complications: Secondary | ICD-10-CM

## 2020-01-28 NOTE — Progress Notes (Signed)
Patient ID: Debra Kirk, female   DOB: 10-06-1929, 86 y.o.   MRN: [id number]   Subjective:    Patient ID: Debra Kirk, female    DOB: [birth year], 86 y.o.   MRN: [id number]  HPI This visit occurred during the SARS-CoV-2 public health emergency.  Safety protocols were in place, including screening questions prior to the visit, additional usage of staff PPE, and extensive cleaning of exam room while observing appropriate contact time as indicated for disinfecting solutions.  Patient here for hospital follow up.  She is accompanied by her daughter.  History obtained from both of them.  She was admitted 01/24/20 - 01/25/20 with per hospital notes episode of "syncope".  She was having lunch at church.  Had eaten.  Daughter described - that she had episode where she was staring straight ahead.  Mouth open.  She was placed on the ground.  Feet propped up.  EMS called.  She does not remember any of this.  States the first thing she remembers is EMTs talking to her.  She had no precipitating symptoms.  No chest pain or sob.  No headache or dizziness.  No abdominal pain or bowel change.  No urine change.  Was on telemetry while in hospital.  Per note, telemetry showed second degree AV block (Wenckebach) alternating with first degree AV block.  Metoprolol discontinued.  Was discharged with plans to f/u with cardiology tomorrow.  Since her discharge, she had not had another episode.  She has had one previous episode that occurred while driving.  Brief episode where she did not know where she was.  Quickly recovered and recognized where she was with no other associated symptoms.  She has not driven since.  Eating and drinking well.  No abdominal pain or bowel change reported.  Blood pressure - stable.  No significant sugar change - no lows or extreme highs.    Past Medical History:  Diagnosis Date  . Carotid artery disease ([place])   . Compression fracture    s/p fosamax, prolia  . Diabetes mellitus ([place])   .  Femoral neuropathy    left, elevated CRP, ESR, negative FANA and ANCA  . Fibrocystic breast disease   . Hypercholesterolemia   . Hypertension    Past Surgical History:  Procedure Laterality Date  . APPENDECTOMY    . CHOLECYSTECTOMY    . HERNIA REPAIR    . INGUINAL HERNIA REPAIR     left x 1, right x 2  . OVARIAN CYST REMOVAL    . PARTIAL HYSTERECTOMY     Family History  Problem Relation Age of Onset  . Heart disease Father        myocardial infarction  . Heart disease Mother        myocardial infarction  . Diabetes Mother   . Congestive Heart Failure Mother   . Bone cancer Sister   . Diabetes Sister        x2  . Ovarian cancer Sister 58  . Heart disease Sister        CABG  . Rheum arthritis Sister   . Breast cancer Neg Hx   . Colon cancer Neg Hx    Social History   Socioeconomic History  . Marital status: Widowed    Spouse name: Not on file  . Number of children: 3  . Years of education: Not on file  . Highest education level: Not on file  Occupational History  . Not on file  Tobacco Use  .  Smoking status: Former Smoker    Quit date: 08/20/1958    Years since quitting: 61.5  . Smokeless tobacco: Never Used  Substance and Sexual Activity  . Alcohol use: No    Alcohol/week: 0.0 standard drinks  . Drug use: No  . Sexual activity: Never  Other Topics Concern  . Not on file  Social History Narrative  . Not on file   Social Determinants of Health   Financial Resource Strain:   . Difficulty of Paying Living Expenses:   Food Insecurity:   . Worried About Charity fundraiser in the Last Year:   . Arboriculturist in the Last Year:   Transportation Needs:   . Film/video editor (Medical):   Marland Kitchen Lack of Transportation (Non-Medical):   Physical Activity:   . Days of Exercise per Week:   . Minutes of Exercise per Session:   Stress:   . Feeling of Stress :   Social Connections:   . Frequency of Communication with Friends and Family:   . Frequency of Social  Gatherings with Friends and Family:   . Attends Religious Services:   . Active Member of Clubs or Organizations:   . Attends Archivist Meetings:   Marland Kitchen Marital Status:     Outpatient Encounter Medications as of 01/28/2020  Medication Sig  . albuterol (PROVENTIL HFA;VENTOLIN HFA) 108 (90 Base) MCG/ACT inhaler Inhale 2 puffs into the lungs every 6 (six) hours as needed for wheezing or shortness of breath. (Patient not taking: Reported on 04/01/2018)  . AMBULATORY NON FORMULARY MEDICATION Medication Name: Incentive Spirometry Use 10-15 times daily.  Marland Kitchen amLODipine (NORVASC) 5 MG tablet TAKE 1 TABLET BY MOUTH DAILY (Patient taking differently: Take 5 mg by mouth daily. )  . aspirin 81 MG EC tablet Take 81 mg by mouth daily.   . Calcium Carbonate-Vitamin D (CALCIUM 600+D) 600-400 MG-UNIT per tablet Take 1 tablet by mouth 2 (two) times daily.  Marland Kitchen denosumab (PROLIA) 60 MG/ML SOLN injection Inject 60 mg into the skin every 6 (six) months. Administer in upper arm, thigh, or abdomen  . dicyclomine (BENTYL) 10 MG capsule Take 10 mg by mouth 3 (three) times daily before meals.   . empagliflozin (JARDIANCE) 25 MG TABS tablet Take 25 mg by mouth daily.  Marland Kitchen glimepiride (AMARYL) 2 MG tablet TAKE 1 TABLET BY MOUTH  TWICE DAILY (Patient taking differently: Take 2 mg by mouth 2 (two) times daily with a meal. )  . glucose blood (BAYER CONTOUR TEST) test strip CHECK DAILY AND AS NEEDED **E11.9**  . meclizine (ANTIVERT) 12.5 MG tablet Take 1 tablet (12.5 mg total) by mouth 2 (two) times daily as needed for dizziness.  . metoCLOPramide (REGLAN) 5 MG tablet Take 5 mg by mouth 2 (two) times daily.   Marland Kitchen omeprazole (PRILOSEC) 20 MG capsule Take 1 capsule (20 mg total) by mouth 2 (two) times daily.  Marland Kitchen Respiratory Therapy Supplies (FLUTTER) DEVI Use 10-15 times daily  . rosuvastatin (CRESTOR) 20 MG tablet TAKE 1 TABLET BY MOUTH EVERY DAY (Patient taking differently: Take 20 mg by mouth daily. )   No  facility-administered encounter medications on file as of 01/28/2020.    Review of Systems  Constitutional: Negative for appetite change and unexpected weight change.  HENT: Negative for congestion and sinus pressure.   Respiratory: Negative for cough, chest tightness and shortness of breath.   Cardiovascular: Negative for chest pain, palpitations and leg swelling.  Gastrointestinal: Negative for abdominal pain, diarrhea,  nausea and vomiting.  Genitourinary: Negative for difficulty urinating and dysuria.  Musculoskeletal: Negative for joint swelling and myalgias.  Skin: Negative for color change and rash.  Neurological: Negative for dizziness, light-headedness and headaches.  Psychiatric/Behavioral: Negative for agitation and dysphoric mood.       Objective:    Physical Exam Vitals reviewed.  Constitutional:      General: She is not in acute distress.    Appearance: Normal appearance.  HENT:     Head: Normocephalic and atraumatic.     Right Ear: External ear normal.     Left Ear: External ear normal.  Eyes:     General: No scleral icterus.       Right eye: No discharge.        Left eye: No discharge.     Conjunctiva/sclera: Conjunctivae normal.  Neck:     Thyroid: No thyromegaly.  Cardiovascular:     Rate and Rhythm: Normal rate and regular rhythm.  Pulmonary:     Effort: No respiratory distress.     Breath sounds: Normal breath sounds. No wheezing.  Abdominal:     General: Bowel sounds are normal.     Palpations: Abdomen is soft.     Tenderness: There is no abdominal tenderness.  Musculoskeletal:        General: No swelling or tenderness.     Cervical back: Neck supple. No tenderness.  Lymphadenopathy:     Cervical: No cervical adenopathy.  Skin:    Findings: No erythema or rash.  Neurological:     Mental Status: She is alert.  Psychiatric:        Mood and Affect: Mood normal.        Behavior: Behavior normal.     BP (!) 146/78   Pulse 89   Temp (!) 97.2  F (36.2 C)   Resp 16   Ht '5\' 6"'$  (1.676 m)   Wt 151 lb 12.8 oz (68.9 kg)   SpO2 98%   BMI 24.50 kg/m  Wt Readings from Last 3 Encounters:  01/28/20 151 lb 12.8 oz (68.9 kg)  01/24/20 154 lb (69.9 kg)  12/15/19 152 lb (68.9 kg)     Lab Results  Component Value Date   WBC 6.8 01/25/2020   HGB 13.0 01/25/2020   HCT 38.0 01/25/2020   PLT 152 01/25/2020   GLUCOSE 146 (H) 01/28/2020   CHOL 164 12/09/2019   TRIG 107.0 12/09/2019   HDL 51.10 12/09/2019   LDLCALC 91 12/09/2019   ALT 13 12/09/2019   AST 16 12/09/2019   NA 136 01/28/2020   K 3.8 01/28/2020   CL 100 01/28/2020   CREATININE 0.82 01/28/2020   BUN 23 01/28/2020   CO2 26 01/28/2020   TSH 1.99 07/29/2019   HGBA1C 8.4 (H) 12/09/2019   MICROALBUR 3.2 (H) 07/29/2019    US Carotid Bilateral  Result Date: 01/25/2020 CLINICAL DATA:  Syncope EXAM: BILATERAL CAROTID DUPLEX ULTRASOUND TECHNIQUE: Pearline Cables scale imaging, color Doppler and duplex ultrasound were performed of bilateral carotid and vertebral arteries in the neck. COMPARISON:  None. FINDINGS: Criteria: Quantification of carotid stenosis is based on velocity parameters that correlate the residual internal carotid diameter with NASCET-based stenosis levels, using the diameter of the distal internal carotid lumen as the denominator for stenosis measurement. The following velocity measurements were obtained: RIGHT ICA: 91/23 cm/sec CCA: 38/46 cm/sec SYSTOLIC ICA/CCA RATIO:  1.2 ECA: 94 cm/sec LEFT ICA: 208/23 cm/sec CCA: 65/99 cm/sec SYSTOLIC ICA/CCA RATIO:  3.2 ECA: 201 cm/sec RIGHT  CAROTID ARTERY: Minor echogenic shadowing plaque formation. No hemodynamically significant right ICA stenosis, velocity elevation, or turbulent flow. Degree of narrowing less than 50%. RIGHT VERTEBRAL ARTERY:  Normal antegrade flow LEFT CAROTID ARTERY: Moderate bifurcation calcific atherosclerosis. Proximal ICA velocity elevation measures up to 208/23 centimeters/second. Mild turbulent flow noted. Left  ICA stenosis is estimated at 50-69% by ultrasound criteria. LEFT VERTEBRAL ARTERY:  Normal antegrade flow IMPRESSION: Right ICA narrowing less than 50% Moderate left ICA stenosis estimated at 50-69% Patent antegrade vertebral flow bilaterally Electronically Signed   By: Jerilynn Mages.  Shick M.D.   On: 01/25/2020 14:00   ECHOCARDIOGRAM COMPLETE  Result Date: 01/25/2020    ECHOCARDIOGRAM REPORT   Patient Name:   Debra Kirk Date of Exam: 01/25/2020 Medical Rec #:  [id number]         Height:       66.0 in Accession #:    [id number]        Weight:       154.0 lb Date of Birth:  08-04-1930         BSA:          1.790 m Patient Age:    69 years          BP:           1601/69 mmHg Patient Gender: F                 HR:           64 bpm. Exam Location:  [facility] Procedure: 2D Echo, Color Doppler and Cardiac Doppler Indications:     R55 Syncope  History:         Patient has prior history of Echocardiogram examinations. Risk                  Factors:Hypertension, Diabetes and HCL.  Sonographer:     Charmayne Sheer RDCS (AE) Referring Phys:  [id number] [place] Diagnosing Phys: Serafina Royals MD IMPRESSIONS  1. Left ventricular ejection fraction, by estimation, is 55 to 60%. The left ventricle has normal function. The left ventricle has no regional wall motion abnormalities. Left ventricular diastolic parameters were normal.  2. Right ventricular systolic function is normal. The right ventricular size is normal.  3. The mitral valve is normal in structure. Mild mitral valve regurgitation.  4. The aortic valve is normal in structure. Aortic valve regurgitation is not visualized. FINDINGS  Left Ventricle: Left ventricular ejection fraction, by estimation, is 55 to 60%. The left ventricle has normal function. The left ventricle has no regional wall motion abnormalities. The left ventricular internal cavity size was normal in size. There is  no left ventricular hypertrophy. Left ventricular diastolic parameters were normal. Right Ventricle: The  right ventricular size is normal. No increase in right ventricular wall thickness. Right ventricular systolic function is normal. Left Atrium: Left atrial size was normal in size. Right Atrium: Right atrial size was normal in size. Pericardium: There is no evidence of pericardial effusion. Mitral Valve: The mitral valve is normal in structure. Mild mitral valve regurgitation. MV peak gradient, 3.2 mmHg. The mean mitral valve gradient is 1.0 mmHg. Tricuspid Valve: The tricuspid valve is normal in structure. Tricuspid valve regurgitation is trivial. Aortic Valve: The aortic valve is normal in structure. Aortic valve regurgitation is not visualized. Aortic valve mean gradient measures 4.0 mmHg. Aortic valve peak gradient measures 8.9 mmHg. Aortic valve area, by VTI measures 2.32 cm. Pulmonic Valve: The pulmonic valve was normal in structure.  Pulmonic valve regurgitation is trivial. Aorta: The aortic root and ascending aorta are structurally normal, with no evidence of dilitation. IAS/Shunts: No atrial level shunt detected by color flow Doppler.  LEFT VENTRICLE PLAX 2D LVIDd:         3.72 cm  Diastology LVIDs:         2.35 cm  LV e' lateral:   10.80 cm/s LV PW:         1.15 cm  LV E/e' lateral: 6.7 LV IVS:        0.90 cm  LV e' medial:    7.51 cm/s LVOT diam:     1.90 cm  LV E/e' medial:  9.7 LV SV:         68 LV SV Index:   38 LVOT Area:     2.84 cm  RIGHT VENTRICLE RV Basal diam:  3.53 cm LEFT ATRIUM             Index       RIGHT ATRIUM           Index LA diam:        2.70 cm 1.51 cm/m  RA Area:     15.50 cm LA Vol (A2C):   47.7 ml 26.65 ml/m RA Volume:   35.50 ml  19.84 ml/m LA Vol (A4C):   61.2 ml 34.20 ml/m LA Biplane Vol: 57.6 ml 32.19 ml/m  AORTIC VALVE                   PULMONIC VALVE AV Area (Vmax):    2.02 cm    PV Vmax:       0.97 m/s AV Area (Vmean):   2.12 cm    PV Vmean:      70.900 cm/s AV Area (VTI):     2.32 cm    PV VTI:        0.214 m AV Vmax:           149.00 cm/s PV Peak grad:  3.7 mmHg  AV Vmean:          95.000 cm/s PV Mean grad:  2.0 mmHg AV VTI:            0.293 m AV Peak Grad:      8.9 mmHg AV Mean Grad:      4.0 mmHg LVOT Vmax:         106.00 cm/s LVOT Vmean:        71.100 cm/s LVOT VTI:          0.240 m LVOT/AV VTI ratio: 0.82  AORTA Ao Root diam: 3.40 cm MITRAL VALVE MV Area (PHT): 3.58 cm    SHUNTS MV Peak grad:  3.2 mmHg    Systemic VTI:  0.24 m MV Mean grad:  1.0 mmHg    Systemic Diam: 1.90 cm MV Vmax:       0.89 m/s MV Vmean:      57.0 cm/s MV Decel Time: 212 msec MV E velocity: 72.80 cm/s MV A velocity: 65.10 cm/s MV E/A ratio:  1.12 Serafina Royals MD Electronically signed by Serafina Royals MD Signature Date/Time: 01/25/2020/5:44:16 PM    Final        Assessment & Plan:   Problem List Items Addressed This Visit    Atherosclerosis of native arteries of extremity with intermittent claudication [facility])    Has been evaluated by Dr Delana Meyer.  On crestor.        Bronchiectasis without complication ([place])  Breathing stable.  Has seen pulmonary.        CAD (coronary artery disease)    No chest pain.  Continue risk factor modification.  Seeing cardiology tomorrow.        Diabetes mellitus type 2, uncomplicated ([facility])    Low carb diet and exercise.  No low sugars.  Follow met b and a1c.  Remains on jardiance and amaryl.       Hypercholesterolemia    On crestor.  Low cholesterol diet and exercise.  Follow met b and a1c.       Hypertension    Blood pressure as outlined.  Off metoprolol.  Continues on amlodipine.  Follow pressures.  Follow metabolic panel.       LOC (loss of consciousness) ([place])    Admitted with episode of unresponsiveness as outlined.  Episode described as staring straight with mouth open.  Only lasted a brief period.  Was answering question with EMS.  No chest pain or sob reported.  No associated symptoms.  No headache or dizziness.  Admitted as outlined.  On telemetry. Per note, apparently prior to discharge it was documented that telemetry showed  second degree AV block (Wenckebach) alternating with first degree AV block.  Metoprolol was discontinued.  No further episodes since discharge.  Plans to f/u with cardiology tomorrow.  Discussed holter monitor.  Also discussed other possible etiologies. Also concern raised regarding possible seizure.  Discussed neurology referral.  Pt and family in agreement.  No longer driving.  Follow.         Relevant Orders   Ambulatory referral to Neurology   Peripheral neuropathy    Stable.        Other Visit Diagnoses    Decreased GFR    -  Primary   Relevant Orders   Basic metabolic panel (Completed)       Einar Pheasant, MD

## 2020-01-29 DIAGNOSIS — E7800 Pure hypercholesterolemia, unspecified: Secondary | ICD-10-CM | POA: Diagnosis not present

## 2020-01-29 DIAGNOSIS — I10 Essential (primary) hypertension: Secondary | ICD-10-CM | POA: Diagnosis not present

## 2020-01-29 DIAGNOSIS — E119 Type 2 diabetes mellitus without complications: Secondary | ICD-10-CM | POA: Diagnosis not present

## 2020-01-29 DIAGNOSIS — R0602 Shortness of breath: Secondary | ICD-10-CM | POA: Diagnosis not present

## 2020-01-29 DIAGNOSIS — I25119 Atherosclerotic heart disease of native coronary artery with unspecified angina pectoris: Secondary | ICD-10-CM | POA: Diagnosis not present

## 2020-01-29 DIAGNOSIS — R55 Syncope and collapse: Secondary | ICD-10-CM | POA: Diagnosis not present

## 2020-01-29 LAB — BASIC METABOLIC PANEL
BUN: 23 mg/dL (ref 6–23)
CO2: 26 mEq/L (ref 19–32)
Calcium: 9.2 mg/dL (ref 8.4–10.5)
Chloride: 100 mEq/L (ref 96–112)
Creatinine, Ser: 0.82 mg/dL (ref 0.40–1.20)
GFR: 65.75 mL/min (ref >60.00)
Glucose, Bld: 146 mg/dL — ABNORMAL HIGH (ref 70–99)
Potassium: 3.8 mEq/L (ref 3.5–5.1)
Sodium: 136 mEq/L (ref 135–145)

## 2020-02-05 NOTE — Assessment & Plan Note (Signed)
Has been evaluated by Dr Delana Meyer.  On crestor.

## 2020-02-05 NOTE — Assessment & Plan Note (Signed)
Stable

## 2020-02-05 NOTE — Assessment & Plan Note (Signed)
Low carb diet and exercise.  No low sugars.  Follow met b and a1c.  Remains on jardiance and amaryl.

## 2020-02-05 NOTE — Assessment & Plan Note (Signed)
No chest pain.  Continue risk factor modification.  Seeing cardiology tomorrow.

## 2020-02-05 NOTE — Assessment & Plan Note (Signed)
Breathing stable.  Has seen pulmonary.

## 2020-02-05 NOTE — Assessment & Plan Note (Signed)
Admitted with episode of unresponsiveness as outlined.  Episode described as staring straight with mouth open.  Only lasted a brief period.  Was answering question with EMS.  No chest pain or sob reported.  No associated symptoms.  No headache or dizziness.  Admitted as outlined.  On telemetry. Per note, apparently prior to discharge it was documented that telemetry showed second degree AV block (Wenckebach) alternating with first degree AV block.  Metoprolol was discontinued.  No further episodes since discharge.  Plans to f/u with cardiology tomorrow.  Discussed holter monitor.  Also discussed other possible etiologies. Also concern raised regarding possible seizure.  Discussed neurology referral.  Pt and family in agreement.  No longer driving.  Follow.

## 2020-02-05 NOTE — Assessment & Plan Note (Signed)
Blood pressure as outlined.  Off metoprolol.  Continues on amlodipine.  Follow pressures.  Follow metabolic panel.  

## 2020-02-05 NOTE — Assessment & Plan Note (Signed)
On crestor.  Low cholesterol diet and exercise.  Follow met b and a1c.   

## 2020-02-20 ENCOUNTER — Other Ambulatory Visit: Payer: Self-pay | Admitting: Internal Medicine

## 2020-02-26 ENCOUNTER — Telehealth: Payer: Self-pay | Admitting: Internal Medicine

## 2020-02-26 NOTE — Telephone Encounter (Signed)
Clarified with Dr Nicki Reaper and pt. She is going to take 2 tabs of 10 mg until she runs out and then start taking Jardiance 25 mg 1 tab q day.

## 2020-02-26 NOTE — Telephone Encounter (Signed)
Pt called wanted to know how Dr.Scott wanted her to take her medication because she received 2 bottles of 10 mg and then she received her 25mg  that Dr.Scott changed her to and now that she has the 2 bottles of her medication how does Dr.Scoot want her to take it before she get another bottle of 25mg empagliflozin (JARDIANCE) 25 MG TABS tablet .

## 2020-03-03 DIAGNOSIS — R569 Unspecified convulsions: Secondary | ICD-10-CM | POA: Diagnosis not present

## 2020-03-08 DIAGNOSIS — R569 Unspecified convulsions: Secondary | ICD-10-CM | POA: Diagnosis not present

## 2020-03-29 ENCOUNTER — Telehealth: Payer: Self-pay | Admitting: *Deleted

## 2020-03-29 DIAGNOSIS — E7800 Pure hypercholesterolemia, unspecified: Secondary | ICD-10-CM

## 2020-03-29 DIAGNOSIS — E119 Type 2 diabetes mellitus without complications: Secondary | ICD-10-CM

## 2020-03-29 DIAGNOSIS — I10 Essential (primary) hypertension: Secondary | ICD-10-CM

## 2020-03-29 NOTE — Telephone Encounter (Signed)
Please place future orders for lab appt.  

## 2020-03-30 ENCOUNTER — Other Ambulatory Visit (INDEPENDENT_AMBULATORY_CARE_PROVIDER_SITE_OTHER): Payer: Medicare Other

## 2020-03-30 ENCOUNTER — Other Ambulatory Visit: Payer: Self-pay

## 2020-03-30 DIAGNOSIS — E119 Type 2 diabetes mellitus without complications: Secondary | ICD-10-CM | POA: Diagnosis not present

## 2020-03-30 DIAGNOSIS — R569 Unspecified convulsions: Secondary | ICD-10-CM | POA: Diagnosis not present

## 2020-03-30 DIAGNOSIS — E7800 Pure hypercholesterolemia, unspecified: Secondary | ICD-10-CM

## 2020-03-30 DIAGNOSIS — R55 Syncope and collapse: Secondary | ICD-10-CM | POA: Diagnosis not present

## 2020-03-30 LAB — BASIC METABOLIC PANEL
BUN: 14 mg/dL (ref 6–23)
CO2: 24 mEq/L (ref 19–32)
Calcium: 9.5 mg/dL (ref 8.4–10.5)
Chloride: 101 mEq/L (ref 96–112)
Creatinine, Ser: 0.87 mg/dL (ref 0.40–1.20)
GFR: 61.39 mL/min (ref >60.00)
Glucose, Bld: 221 mg/dL — ABNORMAL HIGH (ref 70–99)
Potassium: 3.5 mEq/L (ref 3.5–5.1)
Sodium: 138 mEq/L (ref 135–145)

## 2020-03-30 LAB — LIPID PANEL
Cholesterol: 158 mg/dL (ref 0–200)
HDL: 55.4 mg/dL (ref >39.00)
LDL Cholesterol: 83 mg/dL (ref 0–99)
NonHDL: 102.13
Total CHOL/HDL Ratio: 3
Triglycerides: 95 mg/dL (ref 0.0–149.0)
VLDL: 19 mg/dL (ref 0.0–40.0)

## 2020-03-30 LAB — HEPATIC FUNCTION PANEL
ALT: 14 U/L (ref 0–35)
AST: 15 U/L (ref 0–37)
Albumin: 4.2 g/dL (ref 3.5–5.2)
Alkaline Phosphatase: 40 U/L (ref 39–117)
Bilirubin, Direct: 0.1 mg/dL (ref 0.0–0.3)
Total Bilirubin: 0.7 mg/dL (ref 0.2–1.2)
Total Protein: 7.1 g/dL (ref 6.0–8.3)

## 2020-03-30 LAB — HEMOGLOBIN A1C: Hgb A1c MFr Bld: 7.6 % — ABNORMAL HIGH (ref 4.6–6.5)

## 2020-03-30 NOTE — Telephone Encounter (Signed)
Orders placed for labs

## 2020-03-31 ENCOUNTER — Ambulatory Visit: Payer: Medicare Other

## 2020-04-06 ENCOUNTER — Other Ambulatory Visit: Payer: Self-pay

## 2020-04-06 ENCOUNTER — Ambulatory Visit (INDEPENDENT_AMBULATORY_CARE_PROVIDER_SITE_OTHER): Payer: Medicare Other | Admitting: Internal Medicine

## 2020-04-06 VITALS — BP 132/70 | HR 78 | Temp 97.9°F | Resp 16 | Ht 66.0 in | Wt 150.6 lb

## 2020-04-06 DIAGNOSIS — M810 Age-related osteoporosis without current pathological fracture: Secondary | ICD-10-CM | POA: Diagnosis not present

## 2020-04-06 DIAGNOSIS — I10 Essential (primary) hypertension: Secondary | ICD-10-CM | POA: Diagnosis not present

## 2020-04-06 DIAGNOSIS — G6289 Other specified polyneuropathies: Secondary | ICD-10-CM | POA: Diagnosis not present

## 2020-04-06 DIAGNOSIS — E119 Type 2 diabetes mellitus without complications: Secondary | ICD-10-CM

## 2020-04-06 DIAGNOSIS — R4020 Unspecified coma: Secondary | ICD-10-CM

## 2020-04-06 DIAGNOSIS — E7800 Pure hypercholesterolemia, unspecified: Secondary | ICD-10-CM | POA: Diagnosis not present

## 2020-04-06 DIAGNOSIS — R55 Syncope and collapse: Secondary | ICD-10-CM

## 2020-04-06 DIAGNOSIS — I70213 Atherosclerosis of native arteries of extremities with intermittent claudication, bilateral legs: Secondary | ICD-10-CM | POA: Diagnosis not present

## 2020-04-06 NOTE — Progress Notes (Signed)
Subjective:    Patient ID: Debra Kirk, female    DOB: December 15, 1929, 86 y.o.   MRN: [id number]  HPI This visit occurred during the SARS-CoV-2 public health emergency.  Safety protocols were in place, including screening questions prior to the visit, additional usage of staff PPE, and extensive cleaning of exam room while observing appropriate contact time as indicated for disinfecting solutions.  Patient here for a scheduled follow up. She is accompanied by her daughter history obtained from both of them.  Admitted 01/24/20 with syncope.  See last office note for details.  She has seen cardiology.  Telemetry in hospital reveal predominant sinus rhythm with first degree AV block.  Metoprolol was discontinued.  ECHO 01/25/20 - normal LVEF 55-60%.  Lexi scan - without evidence of scar or ischemia.  Evaluated by neurology.  Both EEGs negative.  She reports that since her hospitalization, she has not had any further episodes.  Tries to stay active.  No chest pain or sob.  No acid reflux or abdominal pain reported.  Bowels stable. Blood pressures averaging 132/68-70. Overall she feels she is doing well.    Past Medical History:  Diagnosis Date  . Carotid artery disease ([place])   . Compression fracture    s/p fosamax, prolia  . Diabetes mellitus ([place])   . Femoral neuropathy    left, elevated CRP, ESR, negative FANA and ANCA  . Fibrocystic breast disease   . Hypercholesterolemia   . Hypertension    Past Surgical History:  Procedure Laterality Date  . APPENDECTOMY    . CHOLECYSTECTOMY    . HERNIA REPAIR    . INGUINAL HERNIA REPAIR     left x 1, right x 2  . OVARIAN CYST REMOVAL    . PARTIAL HYSTERECTOMY     Family History  Problem Relation Age of Onset  . Heart disease Father        myocardial infarction  . Heart disease Mother        myocardial infarction  . Diabetes Mother   . Congestive Heart Failure Mother   . Bone cancer Sister   . Diabetes Sister        x2  . Ovarian cancer  Sister 20  . Heart disease Sister        CABG  . Rheum arthritis Sister   . Breast cancer Neg Hx   . Colon cancer Neg Hx    Social History   Socioeconomic History  . Marital status: Widowed    Spouse name: Not on file  . Number of children: 3  . Years of education: Not on file  . Highest education level: Not on file  Occupational History  . Not on file  Tobacco Use  . Smoking status: Former Smoker    Quit date: 08/20/1958    Years since quitting: 61.6  . Smokeless tobacco: Never Used  Substance and Sexual Activity  . Alcohol use: No    Alcohol/week: 0.0 standard drinks  . Drug use: No  . Sexual activity: Never  Other Topics Concern  . Not on file  Social History Narrative  . Not on file   Social Determinants of Health   Financial Resource Strain:   . Difficulty of Paying Living Expenses: Not on file  Food Insecurity:   . Worried About Charity fundraiser in the Last Year: Not on file  . Ran Out of Food in the Last Year: Not on file  Transportation Needs:   . Lack  of Transportation (Medical): Not on file  . Lack of Transportation (Non-Medical): Not on file  Physical Activity:   . Days of Exercise per Week: Not on file  . Minutes of Exercise per Session: Not on file  Stress:   . Feeling of Stress : Not on file  Social Connections:   . Frequency of Communication with Friends and Family: Not on file  . Frequency of Social Gatherings with Friends and Family: Not on file  . Attends Religious Services: Not on file  . Active Member of Clubs or Organizations: Not on file  . Attends Archivist Meetings: Not on file  . Marital Status: Not on file    Outpatient Encounter Medications as of 04/06/2020  Medication Sig  . albuterol (PROVENTIL HFA;VENTOLIN HFA) 108 (90 Base) MCG/ACT inhaler Inhale 2 puffs into the lungs every 6 (six) hours as needed for wheezing or shortness of breath. (Patient not taking: Reported on 04/01/2018)  . AMBULATORY NON FORMULARY MEDICATION  Medication Name: Incentive Spirometry Use 10-15 times daily.  Marland Kitchen amLODipine (NORVASC) 5 MG tablet TAKE 1 TABLET BY MOUTH DAILY  . aspirin 81 MG EC tablet Take 81 mg by mouth daily.   . Calcium Carbonate-Vitamin D (CALCIUM 600+D) 600-400 MG-UNIT per tablet Take 1 tablet by mouth 2 (two) times daily.  Marland Kitchen denosumab (PROLIA) 60 MG/ML SOLN injection Inject 60 mg into the skin every 6 (six) months. Administer in upper arm, thigh, or abdomen  . dicyclomine (BENTYL) 10 MG capsule Take 10 mg by mouth 3 (three) times daily before meals.   . empagliflozin (JARDIANCE) 25 MG TABS tablet Take 25 mg by mouth daily.  Marland Kitchen glimepiride (AMARYL) 2 MG tablet TAKE 1 TABLET BY MOUTH  TWICE DAILY (Patient taking differently: Take 2 mg by mouth 2 (two) times daily with a meal. )  . glucose blood (BAYER CONTOUR TEST) test strip CHECK DAILY AND AS NEEDED **E11.9**  . meclizine (ANTIVERT) 12.5 MG tablet Take 1 tablet (12.5 mg total) by mouth 2 (two) times daily as needed for dizziness.  . metoCLOPramide (REGLAN) 5 MG tablet Take 5 mg by mouth 2 (two) times daily.   Marland Kitchen omeprazole (PRILOSEC) 20 MG capsule Take 1 capsule (20 mg total) by mouth 2 (two) times daily.  Marland Kitchen Respiratory Therapy Supplies (FLUTTER) DEVI Use 10-15 times daily  . rosuvastatin (CRESTOR) 20 MG tablet TAKE 1 TABLET BY MOUTH EVERY DAY (Patient taking differently: Take 20 mg by mouth daily. )  . [EXPIRED] denosumab (PROLIA) injection 60 mg    No facility-administered encounter medications on file as of 04/06/2020.    Review of Systems  Constitutional: Negative for appetite change and unexpected weight change.  HENT: Negative for congestion and sinus pressure.   Respiratory: Negative for cough, chest tightness and shortness of breath.   Cardiovascular: Negative for chest pain, palpitations and leg swelling.  Gastrointestinal: Negative for abdominal pain, diarrhea, nausea and vomiting.  Genitourinary: Negative for difficulty urinating and dysuria.    Musculoskeletal: Negative for joint swelling and myalgias.  Skin: Negative for color change and rash.  Neurological: Negative for dizziness, light-headedness and headaches.  Psychiatric/Behavioral: Negative for agitation and dysphoric mood.       Objective:    Physical Exam Vitals reviewed.  Constitutional:      General: She is not in acute distress.    Appearance: Normal appearance.  HENT:     Head: Normocephalic and atraumatic.     Right Ear: External ear normal.     Left  Ear: External ear normal.  Eyes:     General: No scleral icterus.       Right eye: No discharge.        Left eye: No discharge.     Conjunctiva/sclera: Conjunctivae normal.  Neck:     Thyroid: No thyromegaly.  Cardiovascular:     Rate and Rhythm: Normal rate and regular rhythm.  Pulmonary:     Effort: No respiratory distress.     Breath sounds: Normal breath sounds. No wheezing.  Abdominal:     General: Bowel sounds are normal.     Palpations: Abdomen is soft.     Tenderness: There is no abdominal tenderness.  Musculoskeletal:        General: No swelling or tenderness.     Cervical back: Neck supple. No tenderness.  Lymphadenopathy:     Cervical: No cervical adenopathy.  Skin:    Findings: No erythema or rash.  Neurological:     Mental Status: She is alert.  Psychiatric:        Mood and Affect: Mood normal.        Behavior: Behavior normal.     BP 132/70   Pulse 78   Temp 97.9 F (36.6 C) (Oral)   Resp 16   Ht _0  (1.676 m)   Wt 150 lb 9.6 oz (68.3 kg)   SpO2 98%   BMI 24.31 kg/m  Wt Readings from Last 3 Encounters:  04/06/20 150 lb 9.6 oz (68.3 kg)  01/28/20 151 lb 12.8 oz (68.9 kg)  01/24/20 154 lb (69.9 kg)     Lab Results  Component Value Date   WBC 6.8 01/25/2020   HGB 13.0 01/25/2020   HCT 38.0 01/25/2020   PLT 152 01/25/2020   GLUCOSE 221 (H) 03/30/2020   CHOL 158 03/30/2020   TRIG 95.0 03/30/2020   HDL 55.40 03/30/2020   LDLCALC 83 03/30/2020   ALT 14  03/30/2020   AST 15 03/30/2020   NA 138 03/30/2020   K 3.5 03/30/2020   CL 101 03/30/2020   CREATININE 0.87 03/30/2020   BUN 14 03/30/2020   CO2 24 03/30/2020   TSH 1.99 07/29/2019   HGBA1C 7.6 (H) 03/30/2020   MICROALBUR 3.2 (H) 07/29/2019    US Carotid Bilateral  Result Date: 01/25/2020 CLINICAL DATA:  Syncope EXAM: BILATERAL CAROTID DUPLEX ULTRASOUND TECHNIQUE: Pearline Cables scale imaging, color Doppler and duplex ultrasound were performed of bilateral carotid and vertebral arteries in the neck. COMPARISON:  None. FINDINGS: Criteria: Quantification of carotid stenosis is based on velocity parameters that correlate the residual internal carotid diameter with NASCET-based stenosis levels, using the diameter of the distal internal carotid lumen as the denominator for stenosis measurement. The following velocity measurements were obtained: RIGHT ICA: 91/23 cm/sec CCA: 93/73 cm/sec SYSTOLIC ICA/CCA RATIO:  1.2 ECA: 94 cm/sec LEFT ICA: 208/23 cm/sec CCA: 42/87 cm/sec SYSTOLIC ICA/CCA RATIO:  3.2 ECA: 201 cm/sec RIGHT CAROTID ARTERY: Minor echogenic shadowing plaque formation. No hemodynamically significant right ICA stenosis, velocity elevation, or turbulent flow. Degree of narrowing less than 50%. RIGHT VERTEBRAL ARTERY:  Normal antegrade flow LEFT CAROTID ARTERY: Moderate bifurcation calcific atherosclerosis. Proximal ICA velocity elevation measures up to 208/23 centimeters/second. Mild turbulent flow noted. Left ICA stenosis is estimated at 50-69% by ultrasound criteria. LEFT VERTEBRAL ARTERY:  Normal antegrade flow IMPRESSION: Right ICA narrowing less than 50% Moderate left ICA stenosis estimated at 50-69% Patent antegrade vertebral flow bilaterally Electronically Signed   By: Jerilynn Mages.  Shick M.D.   On: 01/25/2020  14:00   ECHOCARDIOGRAM COMPLETE  Result Date: 01/25/2020    ECHOCARDIOGRAM REPORT   Patient Name:   JAMONI BROADFOOT Date of Exam: 01/25/2020 Medical Rec #:  [id number]         Height:       66.0 in  Accession #:    [id number]        Weight:       154.0 lb Date of Birth:  08/22/1929         BSA:          1.790 m Patient Age:    76 years          BP:           1601/69 mmHg Patient Gender: F                 HR:           64 bpm. Exam Location:  [facility] Procedure: 2D Echo, Color Doppler and Cardiac Doppler Indications:     R55 Syncope  History:         Patient has prior history of Echocardiogram examinations. Risk                  Factors:Hypertension, Diabetes and HCL.  Sonographer:     Charmayne Sheer RDCS (AE) Referring Phys:  [id number] [place] Diagnosing Phys: Debra Royals MD IMPRESSIONS  1. Left ventricular ejection fraction, by estimation, is 55 to 60%. The left ventricle has normal function. The left ventricle has no regional wall motion abnormalities. Left ventricular diastolic parameters were normal.  2. Right ventricular systolic function is normal. The right ventricular size is normal.  3. The mitral valve is normal in structure. Mild mitral valve regurgitation.  4. The aortic valve is normal in structure. Aortic valve regurgitation is not visualized. FINDINGS  Left Ventricle: Left ventricular ejection fraction, by estimation, is 55 to 60%. The left ventricle has normal function. The left ventricle has no regional wall motion abnormalities. The left ventricular internal cavity size was normal in size. There is  no left ventricular hypertrophy. Left ventricular diastolic parameters were normal. Right Ventricle: The right ventricular size is normal. No increase in right ventricular wall thickness. Right ventricular systolic function is normal. Left Atrium: Left atrial size was normal in size. Right Atrium: Right atrial size was normal in size. Pericardium: There is no evidence of pericardial effusion. Mitral Valve: The mitral valve is normal in structure. Mild mitral valve regurgitation. MV peak gradient, 3.2 mmHg. The mean mitral valve gradient is 1.0 mmHg. Tricuspid Valve: The tricuspid valve is normal in  structure. Tricuspid valve regurgitation is trivial. Aortic Valve: The aortic valve is normal in structure. Aortic valve regurgitation is not visualized. Aortic valve mean gradient measures 4.0 mmHg. Aortic valve peak gradient measures 8.9 mmHg. Aortic valve area, by VTI measures 2.32 cm. Pulmonic Valve: The pulmonic valve was normal in structure. Pulmonic valve regurgitation is trivial. Aorta: The aortic root and ascending aorta are structurally normal, with no evidence of dilitation. IAS/Shunts: No atrial level shunt detected by color flow Doppler.  LEFT VENTRICLE PLAX 2D LVIDd:         3.72 cm  Diastology LVIDs:         2.35 cm  LV e' lateral:   10.80 cm/s LV PW:         1.15 cm  LV E/e' lateral: 6.7 LV IVS:        0.90 cm  LV e' medial:  7.51 cm/s LVOT diam:     1.90 cm  LV E/e' medial:  9.7 LV SV:         68 LV SV Index:   38 LVOT Area:     2.84 cm  RIGHT VENTRICLE RV Basal diam:  3.53 cm LEFT ATRIUM             Index       RIGHT ATRIUM           Index LA diam:        2.70 cm 1.51 cm/m  RA Area:     15.50 cm LA Vol (A2C):   47.7 ml 26.65 ml/m RA Volume:   35.50 ml  19.84 ml/m LA Vol (A4C):   61.2 ml 34.20 ml/m LA Biplane Vol: 57.6 ml 32.19 ml/m  AORTIC VALVE                   PULMONIC VALVE AV Area (Vmax):    2.02 cm    PV Vmax:       0.97 m/s AV Area (Vmean):   2.12 cm    PV Vmean:      70.900 cm/s AV Area (VTI):     2.32 cm    PV VTI:        0.214 m AV Vmax:           149.00 cm/s PV Peak grad:  3.7 mmHg AV Vmean:          95.000 cm/s PV Mean grad:  2.0 mmHg AV VTI:            0.293 m AV Peak Grad:      8.9 mmHg AV Mean Grad:      4.0 mmHg LVOT Vmax:         106.00 cm/s LVOT Vmean:        71.100 cm/s LVOT VTI:          0.240 m LVOT/AV VTI ratio: 0.82  AORTA Ao Root diam: 3.40 cm MITRAL VALVE MV Area (PHT): 3.58 cm    SHUNTS MV Peak grad:  3.2 mmHg    Systemic VTI:  0.24 m MV Mean grad:  1.0 mmHg    Systemic Diam: 1.90 cm MV Vmax:       0.89 m/s MV Vmean:      57.0 cm/s MV Decel Time: 212 msec MV  E velocity: 72.80 cm/s MV A velocity: 65.10 cm/s MV E/A ratio:  1.12 Debra Royals MD Electronically signed by Debra Royals MD Signature Date/Time: 01/25/2020/5:44:16 PM    Final        Assessment & Plan:   Problem List Items Addressed This Visit    Syncope    Worked up by cardiology and neurology as outlined.  ECHO and lexi scan negative.  Telemetry as outlined. Off metoprolol.  EEGs negative.  No recurring episodes.  Follow.       Peripheral neuropathy    Stable.       LOC (loss of consciousness) ([place])    Recent admission as outlined.  Telemetry as outlined.  Off metoprolol.  No recurring episodes.  EEGs negative.       Hypertension    Blood pressure as outlined.  Off metoprolol.  Continues on amlodipine.  Follow pressures.  Follow metabolic panel.       Relevant Orders   TSH   Basic metabolic panel   Hypercholesterolemia    On crestor.  Low cholesterol diet and exercise.  Follow  lipid panel and liver function tests.        Relevant Orders   Hepatic function panel   Lipid panel   Diabetes mellitus type 2, uncomplicated (HCC)    Low carb diet and exercise.  On jardiance.  Follow met b and a1c.   Lab Results  Component Value Date   HGBA1C 7.6 (H) 03/30/2020        Relevant Orders   Hemoglobin A1c   Microalbumin / creatinine urine ratio    Other Visit Diagnoses    Age-related osteoporosis without current pathological fracture    -  Primary   Relevant Medications   denosumab (PROLIA) injection 60 mg (Completed)       Einar Pheasant, MD

## 2020-04-07 DIAGNOSIS — M810 Age-related osteoporosis without current pathological fracture: Secondary | ICD-10-CM | POA: Diagnosis not present

## 2020-04-07 MED ORDER — DENOSUMAB 60 MG/ML ~~LOC~~ SOSY
60.0000 mg | PREFILLED_SYRINGE | Freq: Once | SUBCUTANEOUS | Status: AC
  Administered 2020-04-07: 60 mg via SUBCUTANEOUS

## 2020-04-11 ENCOUNTER — Encounter: Payer: Self-pay | Admitting: Internal Medicine

## 2020-04-11 NOTE — Assessment & Plan Note (Signed)
Blood pressure as outlined.  Off metoprolol.  Continues on amlodipine.  Follow pressures.  Follow metabolic panel.

## 2020-04-11 NOTE — Assessment & Plan Note (Signed)
On crestor.  Low cholesterol diet and exercise.  Follow lipid panel and liver function tests.   

## 2020-04-11 NOTE — Assessment & Plan Note (Signed)
Stable

## 2020-04-11 NOTE — Assessment & Plan Note (Signed)
Worked up by cardiology and neurology as outlined.  ECHO and lexi scan negative.  Telemetry as outlined. Off metoprolol.  EEGs negative.  No recurring episodes.  Follow.

## 2020-04-11 NOTE — Assessment & Plan Note (Signed)
Low carb diet and exercise.  On jardiance.  Follow met b and a1c.   Lab Results  Component Value Date   HGBA1C 7.6 (H) 03/30/2020

## 2020-04-11 NOTE — Assessment & Plan Note (Signed)
Recent admission as outlined.  Telemetry as outlined.  Off metoprolol.  No recurring episodes.  EEGs negative.

## 2020-05-16 DIAGNOSIS — E119 Type 2 diabetes mellitus without complications: Secondary | ICD-10-CM | POA: Diagnosis not present

## 2020-05-16 DIAGNOSIS — E7800 Pure hypercholesterolemia, unspecified: Secondary | ICD-10-CM | POA: Diagnosis not present

## 2020-05-16 DIAGNOSIS — R0602 Shortness of breath: Secondary | ICD-10-CM | POA: Diagnosis not present

## 2020-05-16 DIAGNOSIS — Z23 Encounter for immunization: Secondary | ICD-10-CM | POA: Diagnosis not present

## 2020-05-16 DIAGNOSIS — I10 Essential (primary) hypertension: Secondary | ICD-10-CM | POA: Diagnosis not present

## 2020-05-16 DIAGNOSIS — I25119 Atherosclerotic heart disease of native coronary artery with unspecified angina pectoris: Secondary | ICD-10-CM | POA: Diagnosis not present

## 2020-06-08 DIAGNOSIS — Z85828 Personal history of other malignant neoplasm of skin: Secondary | ICD-10-CM | POA: Diagnosis not present

## 2020-06-08 DIAGNOSIS — D2261 Melanocytic nevi of right upper limb, including shoulder: Secondary | ICD-10-CM | POA: Diagnosis not present

## 2020-06-08 DIAGNOSIS — L821 Other seborrheic keratosis: Secondary | ICD-10-CM | POA: Diagnosis not present

## 2020-06-08 DIAGNOSIS — X32XXXA Exposure to sunlight, initial encounter: Secondary | ICD-10-CM | POA: Diagnosis not present

## 2020-06-08 DIAGNOSIS — D225 Melanocytic nevi of trunk: Secondary | ICD-10-CM | POA: Diagnosis not present

## 2020-06-08 DIAGNOSIS — L570 Actinic keratosis: Secondary | ICD-10-CM | POA: Diagnosis not present

## 2020-06-08 DIAGNOSIS — D2262 Melanocytic nevi of left upper limb, including shoulder: Secondary | ICD-10-CM | POA: Diagnosis not present

## 2020-06-14 DIAGNOSIS — Z23 Encounter for immunization: Secondary | ICD-10-CM | POA: Diagnosis not present

## 2020-07-01 ENCOUNTER — Other Ambulatory Visit: Payer: Self-pay | Admitting: Internal Medicine

## 2020-07-06 ENCOUNTER — Telehealth: Payer: Self-pay | Admitting: Internal Medicine

## 2020-07-06 DIAGNOSIS — Z23 Encounter for immunization: Secondary | ICD-10-CM | POA: Diagnosis not present

## 2020-07-06 NOTE — Telephone Encounter (Signed)
Placed in folder for signature

## 2020-07-06 NOTE — Telephone Encounter (Signed)
Form faxed to patient assistance company.

## 2020-07-06 NOTE — Telephone Encounter (Signed)
Pt dropped off Patient Asst form to be filled out  Placed in Dr. Bary Leriche color folder upfront

## 2020-07-06 NOTE — Telephone Encounter (Signed)
Signed and placed in box.   

## 2020-07-19 DIAGNOSIS — I10 Essential (primary) hypertension: Secondary | ICD-10-CM | POA: Diagnosis not present

## 2020-07-19 DIAGNOSIS — R040 Epistaxis: Secondary | ICD-10-CM | POA: Diagnosis not present

## 2020-07-20 DIAGNOSIS — R040 Epistaxis: Secondary | ICD-10-CM | POA: Diagnosis not present

## 2020-07-25 DIAGNOSIS — J342 Deviated nasal septum: Secondary | ICD-10-CM | POA: Diagnosis not present

## 2020-07-25 DIAGNOSIS — R040 Epistaxis: Secondary | ICD-10-CM | POA: Diagnosis not present

## 2020-08-02 ENCOUNTER — Telehealth: Payer: Self-pay | Admitting: Internal Medicine

## 2020-08-02 DIAGNOSIS — R040 Epistaxis: Secondary | ICD-10-CM | POA: Diagnosis not present

## 2020-08-02 DIAGNOSIS — J340 Abscess, furuncle and carbuncle of nose: Secondary | ICD-10-CM | POA: Diagnosis not present

## 2020-08-02 NOTE — Telephone Encounter (Signed)
[facility] called wanted to know if some labs could be add to patient lab visit CBC W/Diff , PT, PTT, INR

## 2020-08-02 NOTE — Telephone Encounter (Signed)
Are you okay with adding these on?

## 2020-08-02 NOTE — Telephone Encounter (Signed)
yes

## 2020-08-03 NOTE — Telephone Encounter (Signed)
LM for [facility] to return my call. Need to clarify what labs need to be added

## 2020-08-03 NOTE — Telephone Encounter (Signed)
Spoke with [employer]. Was advised to disregard. She had her labs done yesterday at [facility].

## 2020-08-04 DIAGNOSIS — R040 Epistaxis: Secondary | ICD-10-CM | POA: Diagnosis not present

## 2020-08-04 DIAGNOSIS — I10 Essential (primary) hypertension: Secondary | ICD-10-CM | POA: Diagnosis not present

## 2020-08-05 ENCOUNTER — Telehealth: Payer: Self-pay | Admitting: Internal Medicine

## 2020-08-05 NOTE — Telephone Encounter (Signed)
Yes - will need appt for clearance.  Will also need to contact cardiology for clearance.

## 2020-08-05 NOTE — Telephone Encounter (Signed)
spoke to patient. She does not want to have surgery on 12/21. She stated she felt she was taking a cold. I am going to call her Monday morning to follow up and screen. Spoke with Debra Kirk at ENT. Cancelled procedure for patient and had her refax clearance form. She is going to reach out to patient to reschedule. I have scheduled her for pre-op on 12/21

## 2020-08-05 NOTE — Telephone Encounter (Signed)
I do not have form from ENT but she saw cardiology in September. She would need clearance from them to have surgery, correct?

## 2020-08-05 NOTE — Telephone Encounter (Signed)
Nadine from [facility] called to check on medical clearance  form for patient having surgery on 08-09-20 [phone] FOR Dr.Vaught

## 2020-08-09 ENCOUNTER — Ambulatory Visit: Payer: Medicare Other | Admitting: Internal Medicine

## 2020-08-09 NOTE — Telephone Encounter (Signed)
Moved patient appt to Wednesday. She has to see ENT today at 10:45

## 2020-08-10 ENCOUNTER — Ambulatory Visit (INDEPENDENT_AMBULATORY_CARE_PROVIDER_SITE_OTHER): Payer: Medicare Other | Admitting: Internal Medicine

## 2020-08-10 ENCOUNTER — Other Ambulatory Visit: Payer: Self-pay

## 2020-08-10 ENCOUNTER — Encounter: Payer: Self-pay | Admitting: Internal Medicine

## 2020-08-10 DIAGNOSIS — E119 Type 2 diabetes mellitus without complications: Secondary | ICD-10-CM | POA: Diagnosis not present

## 2020-08-10 DIAGNOSIS — J479 Bronchiectasis, uncomplicated: Secondary | ICD-10-CM | POA: Diagnosis not present

## 2020-08-10 DIAGNOSIS — I10 Essential (primary) hypertension: Secondary | ICD-10-CM | POA: Diagnosis not present

## 2020-08-10 DIAGNOSIS — Z01818 Encounter for other preprocedural examination: Secondary | ICD-10-CM | POA: Diagnosis not present

## 2020-08-10 DIAGNOSIS — I70213 Atherosclerosis of native arteries of extremities with intermittent claudication, bilateral legs: Secondary | ICD-10-CM | POA: Diagnosis not present

## 2020-08-10 NOTE — Progress Notes (Signed)
Patient ID: Debra Kirk, female   DOB: 17-Apr-1930, 86 y.o.   MRN: [id number]   Subjective:    Patient ID: Debra Kirk, female    DOB: 1930-04-01, 86 y.o.   MRN: [id number]  HPI This visit occurred during the SARS-CoV-2 public health emergency.  Safety protocols were in place, including screening questions prior to the visit, additional usage of staff PPE, and extensive cleaning of exam room while observing appropriate contact time as indicated for disinfecting solutions.  Patient here for pre op evaluation.  She is accompanied by her daughter.  History obtained from both of them.  Has been evaluated recently by ENT for persistent nose bleeds.  S/p cauterization Monday after Thanksgiving.  Had persistent bleeding after cauterized.  Went back to ENT and nose - packed.  Was given abx.  Bleeding stopped temporarily.  Back to ENT and packing removed.  Bled in office.  Cauterized again.  Packed again.  Had packing removed yesterday and has not had any bleeding since.  Using abx ointment and saline.  Tentative planning for surgery 08/23/20.  Will need general anesthesia if has the surgery.  She reports otherwise she is doing well.  No chest pain.  Breathing stable.  No increased cough or congestion.  No nausea or vomiting.  Bowels moving.  Blood sugar per her report - ok.    Past Medical History:  Diagnosis Date  . Carotid artery disease ([place])   . Compression fracture    s/p fosamax, prolia  . Diabetes mellitus ([place])   . Femoral neuropathy    left, elevated CRP, ESR, negative FANA and ANCA  . Fibrocystic breast disease   . Hypercholesterolemia   . Hypertension    Past Surgical History:  Procedure Laterality Date  . APPENDECTOMY    . CHOLECYSTECTOMY    . HERNIA REPAIR    . INGUINAL HERNIA REPAIR     left x 1, right x 2  . OVARIAN CYST REMOVAL    . PARTIAL HYSTERECTOMY     Family History  Problem Relation Age of Onset  . Heart disease Father        myocardial infarction  . Heart  disease Mother        myocardial infarction  . Diabetes Mother   . Congestive Heart Failure Mother   . Bone cancer Sister   . Diabetes Sister        x2  . Ovarian cancer Sister 88  . Heart disease Sister        CABG  . Rheum arthritis Sister   . Breast cancer Neg Hx   . Colon cancer Neg Hx    Social History   Socioeconomic History  . Marital status: Widowed    Spouse name: Not on file  . Number of children: 3  . Years of education: Not on file  . Highest education level: Not on file  Occupational History  . Not on file  Tobacco Use  . Smoking status: Former Smoker    Quit date: 08/20/1958    Years since quitting: 62.0  . Smokeless tobacco: Never Used  Substance and Sexual Activity  . Alcohol use: No    Alcohol/week: 0.0 standard drinks  . Drug use: No  . Sexual activity: Never  Other Topics Concern  . Not on file  Social History Narrative  . Not on file   Social Determinants of Health   Financial Resource Strain: Not on file  Food Insecurity: Not on file  Transportation  Needs: Not on file  Physical Activity: Not on file  Stress: Not on file  Social Connections: Not on file    Outpatient Encounter Medications as of 08/10/2020  Medication Sig  . AMBULATORY NON FORMULARY MEDICATION Medication Name: Incentive Spirometry Use 10-15 times daily.  Marland Kitchen amLODipine (NORVASC) 5 MG tablet TAKE 1 TABLET BY MOUTH DAILY  . Calcium Carbonate-Vitamin D 600-400 MG-UNIT tablet Take 1 tablet by mouth 2 (two) times daily.  Marland Kitchen denosumab (PROLIA) 60 MG/ML SOLN injection Inject 60 mg into the skin every 6 (six) months. Administer in upper arm, thigh, or abdomen  . dicyclomine (BENTYL) 10 MG capsule Take 10 mg by mouth 3 (three) times daily before meals.   . empagliflozin (JARDIANCE) 25 MG TABS tablet Take 25 mg by mouth daily.  Marland Kitchen glimepiride (AMARYL) 2 MG tablet TAKE 1 TABLET BY MOUTH  TWICE DAILY (Patient taking differently: Take 2 mg by mouth 2 (two) times daily with a meal.)  .  glucose blood (BAYER CONTOUR TEST) test strip CHECK DAILY AND AS NEEDED **E11.9**  . meclizine (ANTIVERT) 12.5 MG tablet Take 1 tablet (12.5 mg total) by mouth 2 (two) times daily as needed for dizziness.  . metoCLOPramide (REGLAN) 5 MG tablet Take 5 mg by mouth 2 (two) times daily.   Marland Kitchen omeprazole (PRILOSEC) 20 MG capsule Take 1 capsule (20 mg total) by mouth 2 (two) times daily.  Marland Kitchen Respiratory Therapy Supplies (FLUTTER) DEVI Use 10-15 times daily  . rosuvastatin (CRESTOR) 20 MG tablet TAKE 1 TABLET BY MOUTH EVERY DAY  . aspirin 81 MG EC tablet Take 81 mg by mouth daily.  (Patient not taking: Reported on 08/10/2020)  . [DISCONTINUED] albuterol (PROVENTIL HFA;VENTOLIN HFA) 108 (90 Base) MCG/ACT inhaler Inhale 2 puffs into the lungs every 6 (six) hours as needed for wheezing or shortness of breath. (Patient not taking: No sig reported)   No facility-administered encounter medications on file as of 08/10/2020.    Review of Systems  Constitutional: Negative for appetite change and unexpected weight change.  HENT: Negative for postnasal drip and sinus pressure.        Nose bleeds as outlined.   Respiratory: Negative for cough, chest tightness and shortness of breath.   Cardiovascular: Negative for chest pain, palpitations and leg swelling.  Gastrointestinal: Negative for abdominal pain, diarrhea, nausea and vomiting.  Genitourinary: Negative for difficulty urinating and dysuria.  Musculoskeletal: Negative for joint swelling and myalgias.  Skin: Negative for color change and rash.  Neurological: Negative for dizziness, light-headedness and headaches.  Psychiatric/Behavioral: Negative for agitation and dysphoric mood.       Objective:    Physical Exam Vitals reviewed.  Constitutional:      General: She is not in acute distress.    Appearance: Normal appearance.  HENT:     Head: Normocephalic and atraumatic.     Right Ear: External ear normal.     Left Ear: External ear normal.      Mouth/Throat:     Mouth: Oropharynx is clear and moist.  Eyes:     General: No scleral icterus.       Right eye: No discharge.        Left eye: No discharge.     Conjunctiva/sclera: Conjunctivae normal.  Neck:     Thyroid: No thyromegaly.  Cardiovascular:     Rate and Rhythm: Normal rate and regular rhythm.  Pulmonary:     Effort: No respiratory distress.     Breath sounds: Normal breath sounds. No  wheezing.  Abdominal:     General: Bowel sounds are normal.     Palpations: Abdomen is soft.     Tenderness: There is no abdominal tenderness.  Musculoskeletal:        General: No swelling, tenderness or edema.     Cervical back: Neck supple. No tenderness.  Lymphadenopathy:     Cervical: No cervical adenopathy.  Skin:    Findings: No erythema or rash.  Neurological:     Mental Status: She is alert.  Psychiatric:        Mood and Affect: Mood normal.        Behavior: Behavior normal.     BP 130/72   Pulse 79   Temp 98.4 F (36.9 C) (Oral)   Resp 14   Ht 5' 6"  (1.676 m)   Wt 150 lb (68 kg)   SpO2 95%   BMI 24.21 kg/m  Wt Readings from Last 3 Encounters:  08/10/20 150 lb (68 kg)  04/06/20 150 lb 9.6 oz (68.3 kg)  01/28/20 151 lb 12.8 oz (68.9 kg)     Lab Results  Component Value Date   WBC 6.8 01/25/2020   HGB 13.0 01/25/2020   HCT 38.0 01/25/2020   PLT 152 01/25/2020   GLUCOSE 221 (H) 03/30/2020   CHOL 158 03/30/2020   TRIG 95.0 03/30/2020   HDL 55.40 03/30/2020   LDLCALC 83 03/30/2020   ALT 14 03/30/2020   AST 15 03/30/2020   NA 138 03/30/2020   K 3.5 03/30/2020   CL 101 03/30/2020   CREATININE 0.87 03/30/2020   BUN 14 03/30/2020   CO2 24 03/30/2020   TSH 1.99 07/29/2019   HGBA1C 7.6 (H) 03/30/2020   MICROALBUR 3.2 (H) 07/29/2019    US Carotid Bilateral  Result Date: 01/25/2020 CLINICAL DATA:  Syncope EXAM: BILATERAL CAROTID DUPLEX ULTRASOUND TECHNIQUE: Pearline Cables scale imaging, color Doppler and duplex ultrasound were performed of bilateral carotid and  vertebral arteries in the neck. COMPARISON:  None. FINDINGS: Criteria: Quantification of carotid stenosis is based on velocity parameters that correlate the residual internal carotid diameter with NASCET-based stenosis levels, using the diameter of the distal internal carotid lumen as the denominator for stenosis measurement. The following velocity measurements were obtained: RIGHT ICA: 91/23 cm/sec CCA: 60/73 cm/sec SYSTOLIC ICA/CCA RATIO:  1.2 ECA: 94 cm/sec LEFT ICA: 208/23 cm/sec CCA: 71/06 cm/sec SYSTOLIC ICA/CCA RATIO:  3.2 ECA: 201 cm/sec RIGHT CAROTID ARTERY: Minor echogenic shadowing plaque formation. No hemodynamically significant right ICA stenosis, velocity elevation, or turbulent flow. Degree of narrowing less than 50%. RIGHT VERTEBRAL ARTERY:  Normal antegrade flow LEFT CAROTID ARTERY: Moderate bifurcation calcific atherosclerosis. Proximal ICA velocity elevation measures up to 208/23 centimeters/second. Mild turbulent flow noted. Left ICA stenosis is estimated at 50-69% by ultrasound criteria. LEFT VERTEBRAL ARTERY:  Normal antegrade flow IMPRESSION: Right ICA narrowing less than 50% Moderate left ICA stenosis estimated at 50-69% Patent antegrade vertebral flow bilaterally Electronically Signed   By: Jerilynn Mages.  Shick M.D.   On: 01/25/2020 14:00   ECHOCARDIOGRAM COMPLETE  Result Date: 01/25/2020    ECHOCARDIOGRAM REPORT   Patient Name:   SHARINA PETRE Date of Exam: 01/25/2020 Medical Rec #:  [id number]         Height:       66.0 in Accession #:    [id number]        Weight:       154.0 lb Date of Birth:  1930-08-04         BSA:  1.790 m Patient Age:    58 years          BP:           1601/69 mmHg Patient Gender: F                 HR:           64 bpm. Exam Location:  [facility] Procedure: 2D Echo, Color Doppler and Cardiac Doppler Indications:     R55 Syncope  History:         Patient has prior history of Echocardiogram examinations. Risk                  Factors:Hypertension, Diabetes and HCL.   Sonographer:     Charmayne Sheer RDCS (AE) Referring Phys:  [id number] [place] Diagnosing Phys: Serafina Royals MD IMPRESSIONS  1. Left ventricular ejection fraction, by estimation, is 55 to 60%. The left ventricle has normal function. The left ventricle has no regional wall motion abnormalities. Left ventricular diastolic parameters were normal.  2. Right ventricular systolic function is normal. The right ventricular size is normal.  3. The mitral valve is normal in structure. Mild mitral valve regurgitation.  4. The aortic valve is normal in structure. Aortic valve regurgitation is not visualized. FINDINGS  Left Ventricle: Left ventricular ejection fraction, by estimation, is 55 to 60%. The left ventricle has normal function. The left ventricle has no regional wall motion abnormalities. The left ventricular internal cavity size was normal in size. There is  no left ventricular hypertrophy. Left ventricular diastolic parameters were normal. Right Ventricle: The right ventricular size is normal. No increase in right ventricular wall thickness. Right ventricular systolic function is normal. Left Atrium: Left atrial size was normal in size. Right Atrium: Right atrial size was normal in size. Pericardium: There is no evidence of pericardial effusion. Mitral Valve: The mitral valve is normal in structure. Mild mitral valve regurgitation. MV peak gradient, 3.2 mmHg. The mean mitral valve gradient is 1.0 mmHg. Tricuspid Valve: The tricuspid valve is normal in structure. Tricuspid valve regurgitation is trivial. Aortic Valve: The aortic valve is normal in structure. Aortic valve regurgitation is not visualized. Aortic valve mean gradient measures 4.0 mmHg. Aortic valve peak gradient measures 8.9 mmHg. Aortic valve area, by VTI measures 2.32 cm. Pulmonic Valve: The pulmonic valve was normal in structure. Pulmonic valve regurgitation is trivial. Aorta: The aortic root and ascending aorta are structurally normal, with no evidence  of dilitation. IAS/Shunts: No atrial level shunt detected by color flow Doppler.  LEFT VENTRICLE PLAX 2D LVIDd:         3.72 cm  Diastology LVIDs:         2.35 cm  LV e' lateral:   10.80 cm/s LV PW:         1.15 cm  LV E/e' lateral: 6.7 LV IVS:        0.90 cm  LV e' medial:    7.51 cm/s LVOT diam:     1.90 cm  LV E/e' medial:  9.7 LV SV:         68 LV SV Index:   38 LVOT Area:     2.84 cm  RIGHT VENTRICLE RV Basal diam:  3.53 cm LEFT ATRIUM             Index       RIGHT ATRIUM           Index LA diam:  2.70 cm 1.51 cm/m  RA Area:     15.50 cm LA Vol (A2C):   47.7 ml 26.65 ml/m RA Volume:   35.50 ml  19.84 ml/m LA Vol (A4C):   61.2 ml 34.20 ml/m LA Biplane Vol: 57.6 ml 32.19 ml/m  AORTIC VALVE                   PULMONIC VALVE AV Area (Vmax):    2.02 cm    PV Vmax:       0.97 m/s AV Area (Vmean):   2.12 cm    PV Vmean:      70.900 cm/s AV Area (VTI):     2.32 cm    PV VTI:        0.214 m AV Vmax:           149.00 cm/s PV Peak grad:  3.7 mmHg AV Vmean:          95.000 cm/s PV Mean grad:  2.0 mmHg AV VTI:            0.293 m AV Peak Grad:      8.9 mmHg AV Mean Grad:      4.0 mmHg LVOT Vmax:         106.00 cm/s LVOT Vmean:        71.100 cm/s LVOT VTI:          0.240 m LVOT/AV VTI ratio: 0.82  AORTA Ao Root diam: 3.40 cm MITRAL VALVE MV Area (PHT): 3.58 cm    SHUNTS MV Peak grad:  3.2 mmHg    Systemic VTI:  0.24 m MV Mean grad:  1.0 mmHg    Systemic Diam: 1.90 cm MV Vmax:       0.89 m/s MV Vmean:      57.0 cm/s MV Decel Time: 212 msec MV E velocity: 72.80 cm/s MV A velocity: 65.10 cm/s MV E/A ratio:  1.12 Serafina Royals MD Electronically signed by Serafina Royals MD Signature Date/Time: 01/25/2020/5:44:16 PM    Final        Assessment & Plan:   Problem List Items Addressed This Visit    Diabetes mellitus type 2, uncomplicated (HCC)    No problem with low sugars recently.  Will need to hold jardiance and amaryl for procedure as outlined.  Close monitoring of sugars to avoid lows.       Hypertension     Blood pressure has been doing well as outlined.  Follow.        Bronchiectasis without complication (HCC)    Breathing stable.  No cough or congestion.        Pre-op examination    Here for pre op evaluation as outlined.  Having problems with persistent nose bleeds.  Tentative planning for surgery 08/23/20.  EKG - SR with no acute ischemic changes.  Non specific changes note.  Given risk factors and need for general anesthesia, will have cardiology evaluate to confirm no further w/up or evaluation warranted prior to her surgery.  Pt in agreement.  appt scheduled with cardiology 08/15/20.  Will need close intra op and post op monitoring of her heart rate and blood pressure to avoid extremes.  Discussed stopping any blood thinners prior to surgery.  Will need to hold jardiance and amaryl the day of surgery.  Follow sugars.  Further recommendations pending cardiology.        Relevant Orders   EKG 12-Lead (Completed)       Einar Pheasant, MD

## 2020-08-10 NOTE — Patient Instructions (Signed)
appt with Dr Saralyn Pilar 08/15/20 - 11:15

## 2020-08-15 DIAGNOSIS — Z01810 Encounter for preprocedural cardiovascular examination: Secondary | ICD-10-CM | POA: Diagnosis not present

## 2020-08-15 DIAGNOSIS — Z01818 Encounter for other preprocedural examination: Secondary | ICD-10-CM | POA: Diagnosis not present

## 2020-08-15 DIAGNOSIS — R079 Chest pain, unspecified: Secondary | ICD-10-CM | POA: Diagnosis not present

## 2020-08-15 DIAGNOSIS — I10 Essential (primary) hypertension: Secondary | ICD-10-CM | POA: Diagnosis not present

## 2020-08-15 DIAGNOSIS — E119 Type 2 diabetes mellitus without complications: Secondary | ICD-10-CM | POA: Diagnosis not present

## 2020-08-15 DIAGNOSIS — E7800 Pure hypercholesterolemia, unspecified: Secondary | ICD-10-CM | POA: Diagnosis not present

## 2020-08-15 DIAGNOSIS — R0602 Shortness of breath: Secondary | ICD-10-CM | POA: Diagnosis not present

## 2020-08-15 DIAGNOSIS — I25119 Atherosclerotic heart disease of native coronary artery with unspecified angina pectoris: Secondary | ICD-10-CM | POA: Diagnosis not present

## 2020-08-16 ENCOUNTER — Encounter: Payer: Self-pay | Admitting: Internal Medicine

## 2020-08-16 NOTE — Assessment & Plan Note (Addendum)
Here for pre op evaluation as outlined.  Having problems with persistent nose bleeds.  Tentative planning for surgery 08/23/20.  EKG - SR with no acute ischemic changes.  Non specific changes note.  Given risk factors and need for general anesthesia, will have cardiology evaluate to confirm no further w/up or evaluation warranted prior to her surgery.  Pt in agreement.  appt scheduled with cardiology 08/15/20.  Will need close intra op and post op monitoring of her heart rate and blood pressure to avoid extremes.  Discussed stopping any blood thinners prior to surgery.  Will need to hold jardiance and amaryl the day of surgery.  Follow sugars.  Further recommendations pending cardiology.

## 2020-08-16 NOTE — Assessment & Plan Note (Signed)
Blood pressure has been doing well as outlined.  Follow.

## 2020-08-16 NOTE — Assessment & Plan Note (Signed)
No problem with low sugars recently.  Will need to hold jardiance and amaryl for procedure as outlined.  Close monitoring of sugars to avoid lows.

## 2020-08-16 NOTE — Assessment & Plan Note (Signed)
Breathing stable.  No cough or congestion.

## 2020-08-17 ENCOUNTER — Other Ambulatory Visit: Payer: Medicare Other

## 2020-08-23 ENCOUNTER — Telehealth: Payer: Self-pay

## 2020-08-23 NOTE — Telephone Encounter (Signed)
Daughter dropped off Jardiance forms to be filled out again. She states that there were missing pages on original one that Dr Lorin Picket filled out before. Please fax when complete to [phone] and call Mendy when finished [phone]. Placed in folder up front

## 2020-08-24 ENCOUNTER — Ambulatory Visit: Payer: Medicare Other | Admitting: Internal Medicine

## 2020-08-25 NOTE — Telephone Encounter (Signed)
Form signed and placed in box.   

## 2020-08-25 NOTE — Telephone Encounter (Signed)
Form placed out for your signature

## 2020-08-26 NOTE — Telephone Encounter (Signed)
Form has been faxed.

## 2020-08-26 NOTE — Telephone Encounter (Signed)
Faxed and LM for Mendy

## 2020-08-26 NOTE — Telephone Encounter (Signed)
Mendy is aware.

## 2020-08-26 NOTE — Telephone Encounter (Signed)
Placed up front for pickup ° °

## 2020-09-07 ENCOUNTER — Other Ambulatory Visit: Payer: Medicare Other

## 2020-09-26 DIAGNOSIS — E119 Type 2 diabetes mellitus without complications: Secondary | ICD-10-CM | POA: Diagnosis not present

## 2020-09-26 DIAGNOSIS — H353131 Nonexudative age-related macular degeneration, bilateral, early dry stage: Secondary | ICD-10-CM | POA: Diagnosis not present

## 2020-09-26 LAB — HM DIABETES EYE EXAM

## 2020-09-28 ENCOUNTER — Other Ambulatory Visit (INDEPENDENT_AMBULATORY_CARE_PROVIDER_SITE_OTHER): Payer: Medicare Other

## 2020-09-28 ENCOUNTER — Other Ambulatory Visit: Payer: Self-pay

## 2020-09-28 ENCOUNTER — Other Ambulatory Visit: Payer: Medicare Other

## 2020-09-28 DIAGNOSIS — I10 Essential (primary) hypertension: Secondary | ICD-10-CM | POA: Diagnosis not present

## 2020-09-28 DIAGNOSIS — E119 Type 2 diabetes mellitus without complications: Secondary | ICD-10-CM | POA: Diagnosis not present

## 2020-09-28 DIAGNOSIS — E7800 Pure hypercholesterolemia, unspecified: Secondary | ICD-10-CM | POA: Diagnosis not present

## 2020-09-28 LAB — LIPID PANEL
Cholesterol: 149 mg/dL (ref 0–200)
HDL: 51.5 mg/dL (ref >39.00)
LDL Cholesterol: 74 mg/dL (ref 0–99)
NonHDL: 97.79
Total CHOL/HDL Ratio: 3
Triglycerides: 121 mg/dL (ref 0.0–149.0)
VLDL: 24.2 mg/dL (ref 0.0–40.0)

## 2020-09-28 LAB — MICROALBUMIN / CREATININE URINE RATIO
Creatinine,U: 71.5 mg/dL
Microalb Creat Ratio: 2.3 mg/g (ref 0.0–30.0)
Microalb, Ur: 1.7 mg/dL (ref 0.0–1.9)

## 2020-09-28 LAB — BASIC METABOLIC PANEL
BUN: 17 mg/dL (ref 6–23)
CO2: 28 mEq/L (ref 19–32)
Calcium: 9.6 mg/dL (ref 8.4–10.5)
Chloride: 99 mEq/L (ref 96–112)
Creatinine, Ser: 0.87 mg/dL (ref 0.40–1.20)
GFR: 59.28 mL/min — ABNORMAL LOW (ref >60.00)
Glucose, Bld: 198 mg/dL — ABNORMAL HIGH (ref 70–99)
Potassium: 3.7 mEq/L (ref 3.5–5.1)
Sodium: 137 mEq/L (ref 135–145)

## 2020-09-28 LAB — TSH: TSH: 1.35 u[IU]/mL (ref 0.35–4.50)

## 2020-09-28 LAB — HEPATIC FUNCTION PANEL
ALT: 12 U/L (ref 0–35)
AST: 14 U/L (ref 0–37)
Albumin: 4.2 g/dL (ref 3.5–5.2)
Alkaline Phosphatase: 44 U/L (ref 39–117)
Bilirubin, Direct: 0.2 mg/dL (ref 0.0–0.3)
Total Bilirubin: 0.7 mg/dL (ref 0.2–1.2)
Total Protein: 7.2 g/dL (ref 6.0–8.3)

## 2020-09-28 LAB — HEMOGLOBIN A1C: Hgb A1c MFr Bld: 7.3 % — ABNORMAL HIGH (ref 4.6–6.5)

## 2020-10-05 ENCOUNTER — Ambulatory Visit (INDEPENDENT_AMBULATORY_CARE_PROVIDER_SITE_OTHER): Payer: Medicare Other | Admitting: Internal Medicine

## 2020-10-05 ENCOUNTER — Encounter: Payer: Self-pay | Admitting: Internal Medicine

## 2020-10-05 ENCOUNTER — Other Ambulatory Visit: Payer: Self-pay

## 2020-10-05 ENCOUNTER — Ambulatory Visit (INDEPENDENT_AMBULATORY_CARE_PROVIDER_SITE_OTHER): Payer: Medicare Other

## 2020-10-05 DIAGNOSIS — E1165 Type 2 diabetes mellitus with hyperglycemia: Secondary | ICD-10-CM

## 2020-10-05 DIAGNOSIS — E7800 Pure hypercholesterolemia, unspecified: Secondary | ICD-10-CM

## 2020-10-05 DIAGNOSIS — K3184 Gastroparesis: Secondary | ICD-10-CM | POA: Diagnosis not present

## 2020-10-05 DIAGNOSIS — R040 Epistaxis: Secondary | ICD-10-CM | POA: Diagnosis not present

## 2020-10-05 DIAGNOSIS — E1143 Type 2 diabetes mellitus with diabetic autonomic (poly)neuropathy: Secondary | ICD-10-CM | POA: Diagnosis not present

## 2020-10-05 DIAGNOSIS — I70213 Atherosclerosis of native arteries of extremities with intermittent claudication, bilateral legs: Secondary | ICD-10-CM

## 2020-10-05 DIAGNOSIS — R1032 Left lower quadrant pain: Secondary | ICD-10-CM | POA: Diagnosis not present

## 2020-10-05 DIAGNOSIS — I10 Essential (primary) hypertension: Secondary | ICD-10-CM | POA: Diagnosis not present

## 2020-10-05 DIAGNOSIS — I2510 Atherosclerotic heart disease of native coronary artery without angina pectoris: Secondary | ICD-10-CM

## 2020-10-05 DIAGNOSIS — R109 Unspecified abdominal pain: Secondary | ICD-10-CM | POA: Insufficient documentation

## 2020-10-05 DIAGNOSIS — J479 Bronchiectasis, uncomplicated: Secondary | ICD-10-CM

## 2020-10-05 LAB — HM DIABETES FOOT EXAM

## 2020-10-05 NOTE — Progress Notes (Signed)
kPatient ID: Debra Kirk, female   DOB: [birth year], 87 y.o.   MRN: [id number]   Subjective:    Patient ID: Debra Kirk, female    DOB: 1929/08/24, 87 y.o.   MRN: [id number]  HPI This visit occurred during the SARS-CoV-2 public health emergency.  Safety protocols were in place, including screening questions prior to the visit, additional usage of staff PPE, and extensive cleaning of exam room while observing appropriate contact time as indicated for disinfecting solutions.  Patient here for a scheduled follow up.  She is accompanied by her daughter.  History obtained from both of them.  Here to follow up regarding her blood sugar, blood pressure and cholesterol.  No further nose bleeds.  Did not require surgery.  No chest pain.  Breathing stable.  States blood pressures ok. Noticed left side pain - 10 days ago.  States stomach felt tight.  Felt like contraction.  Feels a pulling stinging sensation.  No bowel change.  Lying down better.  Was question if hernia.  Has had multiple hernias previously.  Feels similar.  No nausea or vomiting.  Discussed labs.  a1c improved - 7.3 on recent check.   Past Medical History:  Diagnosis Date  . Carotid artery disease ([place])   . Compression fracture    s/p fosamax, prolia  . Diabetes mellitus ([place])   . Femoral neuropathy    left, elevated CRP, ESR, negative FANA and ANCA  . Fibrocystic breast disease   . Hypercholesterolemia   . Hypertension    Past Surgical History:  Procedure Laterality Date  . APPENDECTOMY    . CHOLECYSTECTOMY    . HERNIA REPAIR    . INGUINAL HERNIA REPAIR     left x 1, right x 2  . OVARIAN CYST REMOVAL    . PARTIAL HYSTERECTOMY     Family History  Problem Relation Age of Onset  . Heart disease Father        myocardial infarction  . Heart disease Mother        myocardial infarction  . Diabetes Mother   . Congestive Heart Failure Mother   . Bone cancer Sister   . Diabetes Sister        x2  . Ovarian cancer  Sister 64  . Heart disease Sister        CABG  . Rheum arthritis Sister   . Breast cancer Neg Hx   . Colon cancer Neg Hx    Social History   Socioeconomic History  . Marital status: Widowed    Spouse name: Not on file  . Number of children: 3  . Years of education: Not on file  . Highest education level: Not on file  Occupational History  . Not on file  Tobacco Use  . Smoking status: Former Smoker    Quit date: 08/20/1958    Years since quitting: 62.1  . Smokeless tobacco: Never Used  Substance and Sexual Activity  . Alcohol use: No    Alcohol/week: 0.0 standard drinks  . Drug use: No  . Sexual activity: Never  Other Topics Concern  . Not on file  Social History Narrative  . Not on file   Social Determinants of Health   Financial Resource Strain: Not on file  Food Insecurity: Not on file  Transportation Needs: Not on file  Physical Activity: Not on file  Stress: Not on file  Social Connections: Not on file    Outpatient Encounter Medications as of 10/05/2020  Medication Sig  . AMBULATORY NON FORMULARY MEDICATION Medication Name: Incentive Spirometry Use 10-15 times daily.  Marland Kitchen amLODipine (NORVASC) 5 MG tablet TAKE 1 TABLET BY MOUTH DAILY  . aspirin 81 MG EC tablet Take 81 mg by mouth daily.  (Patient not taking: Reported on 08/10/2020)  . Calcium Carbonate-Vitamin D 600-400 MG-UNIT tablet Take 1 tablet by mouth 2 (two) times daily.  Marland Kitchen denosumab (PROLIA) 60 MG/ML SOLN injection Inject 60 mg into the skin every 6 (six) months. Administer in upper arm, thigh, or abdomen  . dicyclomine (BENTYL) 10 MG capsule Take 10 mg by mouth 3 (three) times daily before meals.   . empagliflozin (JARDIANCE) 25 MG TABS tablet Take 25 mg by mouth daily.  Marland Kitchen glimepiride (AMARYL) 2 MG tablet TAKE 1 TABLET BY MOUTH  TWICE DAILY (Patient taking differently: Take 2 mg by mouth 2 (two) times daily with a meal.)  . glucose blood (BAYER CONTOUR TEST) test strip CHECK DAILY AND AS NEEDED **E11.9**   . meclizine (ANTIVERT) 12.5 MG tablet Take 1 tablet (12.5 mg total) by mouth 2 (two) times daily as needed for dizziness.  . metoCLOPramide (REGLAN) 5 MG tablet Take 5 mg by mouth 2 (two) times daily.   Marland Kitchen omeprazole (PRILOSEC) 20 MG capsule Take 1 capsule (20 mg total) by mouth 2 (two) times daily.  Marland Kitchen Respiratory Therapy Supplies (FLUTTER) DEVI Use 10-15 times daily  . rosuvastatin (CRESTOR) 20 MG tablet TAKE 1 TABLET BY MOUTH EVERY DAY   No facility-administered encounter medications on file as of 10/05/2020.    Review of Systems  Constitutional: Negative for appetite change and unexpected weight change.  HENT: Negative for congestion and sinus pressure.   Respiratory: Negative for cough, chest tightness and shortness of breath.   Cardiovascular: Negative for chest pain, palpitations and leg swelling.  Gastrointestinal: Negative for diarrhea, nausea and vomiting.       Left side/abdominal pain as outlined.   Genitourinary: Negative for difficulty urinating and dysuria.  Musculoskeletal: Negative for joint swelling and myalgias.  Skin: Negative for color change and rash.  Neurological: Negative for dizziness, light-headedness and headaches.  Psychiatric/Behavioral: Negative for agitation and dysphoric mood.       Objective:    Physical Exam Vitals reviewed.  Constitutional:      General: She is not in acute distress.    Appearance: Normal appearance.  HENT:     Head: Normocephalic and atraumatic.     Right Ear: External ear normal.     Left Ear: External ear normal.     Mouth/Throat:     Mouth: Oropharynx is clear and moist.  Eyes:     General: No scleral icterus.       Right eye: No discharge.        Left eye: No discharge.     Conjunctiva/sclera: Conjunctivae normal.  Neck:     Thyroid: No thyromegaly.  Cardiovascular:     Rate and Rhythm: Normal rate and regular rhythm.  Pulmonary:     Effort: No respiratory distress.     Breath sounds: Normal breath sounds. No  wheezing.  Abdominal:     General: Bowel sounds are normal.     Palpations: Abdomen is soft.     Tenderness: There is no abdominal tenderness.     Comments: Fullness with standing - left side/lower abdomen.  Flat with lying down.    Musculoskeletal:        General: No swelling, tenderness or edema.     Cervical  back: Neck supple. No tenderness.  Lymphadenopathy:     Cervical: No cervical adenopathy.  Skin:    Findings: No erythema or rash.  Neurological:     Mental Status: She is alert.  Psychiatric:        Mood and Affect: Mood normal.        Behavior: Behavior normal.     BP 138/70   Pulse 84   Temp 98.1 F (36.7 C) (Oral)   Resp 16   Ht 5' 6"  (1.676 m)   Wt 147 lb (66.7 kg)   SpO2 98%   BMI 23.73 kg/m  Wt Readings from Last 3 Encounters:  10/05/20 147 lb (66.7 kg)  08/10/20 150 lb (68 kg)  04/06/20 150 lb 9.6 oz (68.3 kg)     Lab Results  Component Value Date   WBC 6.8 01/25/2020   HGB 13.0 01/25/2020   HCT 38.0 01/25/2020   PLT 152 01/25/2020   GLUCOSE 198 (H) 09/28/2020   CHOL 149 09/28/2020   TRIG 121.0 09/28/2020   HDL 51.50 09/28/2020   LDLCALC 74 09/28/2020   ALT 12 09/28/2020   AST 14 09/28/2020   NA 137 09/28/2020   K 3.7 09/28/2020   CL 99 09/28/2020   CREATININE 0.87 09/28/2020   BUN 17 09/28/2020   CO2 28 09/28/2020   TSH 1.35 09/28/2020   HGBA1C 7.3 (H) 09/28/2020   MICROALBUR 1.7 09/28/2020    US Carotid Bilateral  Result Date: 01/25/2020 CLINICAL DATA:  Syncope EXAM: BILATERAL CAROTID DUPLEX ULTRASOUND TECHNIQUE: Pearline Cables scale imaging, color Doppler and duplex ultrasound were performed of bilateral carotid and vertebral arteries in the neck. COMPARISON:  None. FINDINGS: Criteria: Quantification of carotid stenosis is based on velocity parameters that correlate the residual internal carotid diameter with NASCET-based stenosis levels, using the diameter of the distal internal carotid lumen as the denominator for stenosis measurement. The  following velocity measurements were obtained: RIGHT ICA: 91/23 cm/sec CCA: 27/03 cm/sec SYSTOLIC ICA/CCA RATIO:  1.2 ECA: 94 cm/sec LEFT ICA: 208/23 cm/sec CCA: 50/09 cm/sec SYSTOLIC ICA/CCA RATIO:  3.2 ECA: 201 cm/sec RIGHT CAROTID ARTERY: Minor echogenic shadowing plaque formation. No hemodynamically significant right ICA stenosis, velocity elevation, or turbulent flow. Degree of narrowing less than 50%. RIGHT VERTEBRAL ARTERY:  Normal antegrade flow LEFT CAROTID ARTERY: Moderate bifurcation calcific atherosclerosis. Proximal ICA velocity elevation measures up to 208/23 centimeters/second. Mild turbulent flow noted. Left ICA stenosis is estimated at 50-69% by ultrasound criteria. LEFT VERTEBRAL ARTERY:  Normal antegrade flow IMPRESSION: Right ICA narrowing less than 50% Moderate left ICA stenosis estimated at 50-69% Patent antegrade vertebral flow bilaterally Electronically Signed   By: Jerilynn Mages.  Shick M.D.   On: 01/25/2020 14:00   ECHOCARDIOGRAM COMPLETE  Result Date: 01/25/2020    ECHOCARDIOGRAM REPORT   Patient Name:   Debra Kirk Date of Exam: 01/25/2020 Medical Rec #:  [id number]         Height:       66.0 in Accession #:    [id number]        Weight:       154.0 lb Date of Birth:  10-14-1929         BSA:          1.790 m Patient Age:    79 years          BP:           1601/69 mmHg Patient Gender: F  HR:           64 bpm. Exam Location:  [facility] Procedure: 2D Echo, Color Doppler and Cardiac Doppler Indications:     R55 Syncope  History:         Patient has prior history of Echocardiogram examinations. Risk                  Factors:Hypertension, Diabetes and HCL.  Sonographer:     Charmayne Sheer RDCS (AE) Referring Phys:  [id number] [place] Diagnosing Phys: Serafina Royals MD IMPRESSIONS  1. Left ventricular ejection fraction, by estimation, is 55 to 60%. The left ventricle has normal function. The left ventricle has no regional wall motion abnormalities. Left ventricular diastolic parameters were  normal.  2. Right ventricular systolic function is normal. The right ventricular size is normal.  3. The mitral valve is normal in structure. Mild mitral valve regurgitation.  4. The aortic valve is normal in structure. Aortic valve regurgitation is not visualized. FINDINGS  Left Ventricle: Left ventricular ejection fraction, by estimation, is 55 to 60%. The left ventricle has normal function. The left ventricle has no regional wall motion abnormalities. The left ventricular internal cavity size was normal in size. There is  no left ventricular hypertrophy. Left ventricular diastolic parameters were normal. Right Ventricle: The right ventricular size is normal. No increase in right ventricular wall thickness. Right ventricular systolic function is normal. Left Atrium: Left atrial size was normal in size. Right Atrium: Right atrial size was normal in size. Pericardium: There is no evidence of pericardial effusion. Mitral Valve: The mitral valve is normal in structure. Mild mitral valve regurgitation. MV peak gradient, 3.2 mmHg. The mean mitral valve gradient is 1.0 mmHg. Tricuspid Valve: The tricuspid valve is normal in structure. Tricuspid valve regurgitation is trivial. Aortic Valve: The aortic valve is normal in structure. Aortic valve regurgitation is not visualized. Aortic valve mean gradient measures 4.0 mmHg. Aortic valve peak gradient measures 8.9 mmHg. Aortic valve area, by VTI measures 2.32 cm. Pulmonic Valve: The pulmonic valve was normal in structure. Pulmonic valve regurgitation is trivial. Aorta: The aortic root and ascending aorta are structurally normal, with no evidence of dilitation. IAS/Shunts: No atrial level shunt detected by color flow Doppler.  LEFT VENTRICLE PLAX 2D LVIDd:         3.72 cm  Diastology LVIDs:         2.35 cm  LV e' lateral:   10.80 cm/s LV PW:         1.15 cm  LV E/e' lateral: 6.7 LV IVS:        0.90 cm  LV e' medial:    7.51 cm/s LVOT diam:     1.90 cm  LV E/e' medial:  9.7 LV  SV:         68 LV SV Index:   38 LVOT Area:     2.84 cm  RIGHT VENTRICLE RV Basal diam:  3.53 cm LEFT ATRIUM             Index       RIGHT ATRIUM           Index LA diam:        2.70 cm 1.51 cm/m  RA Area:     15.50 cm LA Vol (A2C):   47.7 ml 26.65 ml/m RA Volume:   35.50 ml  19.84 ml/m LA Vol (A4C):   61.2 ml 34.20 ml/m LA Biplane Vol: 57.6 ml 32.19 ml/m  AORTIC  VALVE                   PULMONIC VALVE AV Area (Vmax):    2.02 cm    PV Vmax:       0.97 m/s AV Area (Vmean):   2.12 cm    PV Vmean:      70.900 cm/s AV Area (VTI):     2.32 cm    PV VTI:        0.214 m AV Vmax:           149.00 cm/s PV Peak grad:  3.7 mmHg AV Vmean:          95.000 cm/s PV Mean grad:  2.0 mmHg AV VTI:            0.293 m AV Peak Grad:      8.9 mmHg AV Mean Grad:      4.0 mmHg LVOT Vmax:         106.00 cm/s LVOT Vmean:        71.100 cm/s LVOT VTI:          0.240 m LVOT/AV VTI ratio: 0.82  AORTA Ao Root diam: 3.40 cm MITRAL VALVE MV Area (PHT): 3.58 cm    SHUNTS MV Peak grad:  3.2 mmHg    Systemic VTI:  0.24 m MV Mean grad:  1.0 mmHg    Systemic Diam: 1.90 cm MV Vmax:       0.89 m/s MV Vmean:      57.0 cm/s MV Decel Time: 212 msec MV E velocity: 72.80 cm/s MV A velocity: 65.10 cm/s MV E/A ratio:  1.12 Serafina Royals MD Electronically signed by Serafina Royals MD Signature Date/Time: 01/25/2020/5:44:16 PM    Final        Assessment & Plan:   Problem List Items Addressed This Visit    Abdominal pain    Left side/abdominal pain as outlined.  Fullness and more firm when standing.  Concern over possible hernia.  Has had hernias previously.  Bowels moving.  Check KUB.  Discussed may need CT scan vs surgery evaluation.        Relevant Orders   DG Abd 1 View (Completed)   Atherosclerosis of native arteries of extremity with intermittent claudication ([place])    Has been evaluated by Dr Delana Meyer.  Continue crestor.  Stable.       Bronchiectasis without complication (HCC)    Breathing stable.        CAD (coronary artery  disease)    No chest pain.  Continue risk factor modification.  Continue crestor.       Epistaxis, recurrent    Resolved. Did not require surgery.       Gastroparesis due to DM [facility])    Has been evaluated by GI.  Stable.  Continues on reglan.       Hypercholesterolemia    Continue crestor.  Low cholesterol diet and exercise.  Follow lipid panel and liver function tests.        Relevant Orders   CBC with Differential/Platelet   Hepatic function panel   Lipid panel   Hypertension    Blood pressure as outlined.  Continue amlodipine.  Follow pressures.        Type 2 diabetes mellitus with hyperglycemia (HCC)    Low carb diet and exercise.  Continues on [place], jardiance.  Follow met b and a1c.  Recent a1c improved - 7.3.        Relevant Orders  Hemoglobin X2J   Basic metabolic panel       Einar Pheasant, MD

## 2020-10-09 ENCOUNTER — Encounter: Payer: Self-pay | Admitting: Internal Medicine

## 2020-10-09 NOTE — Assessment & Plan Note (Signed)
Resolved. Did not require surgery.

## 2020-10-09 NOTE — Assessment & Plan Note (Signed)
Has been evaluated by Dr Schnier.  Continue crestor.  Stable.  

## 2020-10-09 NOTE — Assessment & Plan Note (Signed)
Left side/abdominal pain as outlined.  Fullness and more firm when standing.  Concern over possible hernia.  Has had hernias previously.  Bowels moving.  Check KUB.  Discussed may need CT scan vs surgery evaluation.

## 2020-10-09 NOTE — Assessment & Plan Note (Signed)
Blood pressure as outlined.  Continue amlodipine.  Follow pressures.  °

## 2020-10-09 NOTE — Assessment & Plan Note (Signed)
No chest pain.  Continue risk factor modification.  Continue crestor.

## 2020-10-09 NOTE — Assessment & Plan Note (Signed)
Has been evaluated by GI.  Stable.  Continues on reglan.

## 2020-10-09 NOTE — Assessment & Plan Note (Signed)
Continue crestor.  Low cholesterol diet and exercise. Follow lipid panel and liver function tests.   

## 2020-10-09 NOTE — Assessment & Plan Note (Signed)
Low carb diet and exercise.  Continues on [place], jardiance.  Follow met b and a1c.  Recent a1c improved - 7.3.

## 2020-10-09 NOTE — Assessment & Plan Note (Signed)
Breathing stable.

## 2020-10-11 ENCOUNTER — Encounter: Payer: Self-pay | Admitting: Internal Medicine

## 2020-10-11 ENCOUNTER — Telehealth: Payer: Self-pay | Admitting: Internal Medicine

## 2020-10-11 DIAGNOSIS — R1032 Left lower quadrant pain: Secondary | ICD-10-CM

## 2020-10-11 DIAGNOSIS — I700 Atherosclerosis of aorta: Secondary | ICD-10-CM | POA: Insufficient documentation

## 2020-10-11 NOTE — Telephone Encounter (Signed)
See result note.  

## 2020-10-11 NOTE — Telephone Encounter (Signed)
Patient called about her x-ray result

## 2020-10-12 ENCOUNTER — Ambulatory Visit (INDEPENDENT_AMBULATORY_CARE_PROVIDER_SITE_OTHER): Payer: Medicare Other | Admitting: *Deleted

## 2020-10-12 ENCOUNTER — Other Ambulatory Visit: Payer: Self-pay

## 2020-10-12 DIAGNOSIS — M810 Age-related osteoporosis without current pathological fracture: Secondary | ICD-10-CM | POA: Diagnosis not present

## 2020-10-12 MED ORDER — DENOSUMAB 60 MG/ML ~~LOC~~ SOSY
60.0000 mg | PREFILLED_SYRINGE | Freq: Once | SUBCUTANEOUS | Status: AC
Start: 2020-10-12 — End: 2020-10-12
  Administered 2020-10-12: 60 mg via SUBCUTANEOUS

## 2020-10-12 NOTE — Progress Notes (Signed)
Patient presented for Prolia injection to left arm [place], patient voiced no concerns or complaints during or after injection. 

## 2020-10-13 ENCOUNTER — Other Ambulatory Visit: Payer: Self-pay | Admitting: Internal Medicine

## 2020-10-13 NOTE — Telephone Encounter (Signed)
Patient agreeable to have CT scan done and patient is already aware that they will be calling her to set it up.

## 2020-10-13 NOTE — Telephone Encounter (Signed)
Order placed for CT abdomen and pelvis.  

## 2020-10-13 NOTE — Telephone Encounter (Signed)
Pt would like to talk to you about abdominal scan-she agrees to it and wants to tell you avail times

## 2020-10-13 NOTE — Telephone Encounter (Signed)
Order placed for CT scan.  

## 2020-10-31 DIAGNOSIS — E1143 Type 2 diabetes mellitus with diabetic autonomic (poly)neuropathy: Secondary | ICD-10-CM | POA: Diagnosis not present

## 2020-10-31 DIAGNOSIS — R198 Other specified symptoms and signs involving the digestive system and abdomen: Secondary | ICD-10-CM | POA: Diagnosis not present

## 2020-10-31 DIAGNOSIS — E119 Type 2 diabetes mellitus without complications: Secondary | ICD-10-CM | POA: Diagnosis not present

## 2020-10-31 DIAGNOSIS — K219 Gastro-esophageal reflux disease without esophagitis: Secondary | ICD-10-CM | POA: Diagnosis not present

## 2020-10-31 DIAGNOSIS — K3184 Gastroparesis: Secondary | ICD-10-CM | POA: Diagnosis not present

## 2020-11-04 ENCOUNTER — Ambulatory Visit
Admission: RE | Admit: 2020-11-04 | Discharge: 2020-11-04 | Disposition: A | Discharge status: Home / Self Care | Payer: Medicare Other | Source: Ambulatory Visit | Attending: Internal Medicine | Admitting: Internal Medicine

## 2020-11-04 ENCOUNTER — Other Ambulatory Visit: Payer: Self-pay

## 2020-11-04 DIAGNOSIS — Z9049 Acquired absence of other specified parts of digestive tract: Secondary | ICD-10-CM | POA: Diagnosis not present

## 2020-11-04 DIAGNOSIS — R1032 Left lower quadrant pain: Secondary | ICD-10-CM | POA: Diagnosis not present

## 2020-11-04 DIAGNOSIS — K5730 Diverticulosis of large intestine without perforation or abscess without bleeding: Secondary | ICD-10-CM | POA: Diagnosis not present

## 2020-11-04 DIAGNOSIS — I700 Atherosclerosis of aorta: Secondary | ICD-10-CM | POA: Diagnosis not present

## 2020-11-04 DIAGNOSIS — K4090 Unilateral inguinal hernia, without obstruction or gangrene, not specified as recurrent: Secondary | ICD-10-CM | POA: Diagnosis not present

## 2020-11-09 ENCOUNTER — Other Ambulatory Visit: Payer: Self-pay | Admitting: Internal Medicine

## 2020-11-09 ENCOUNTER — Telehealth: Payer: Self-pay | Admitting: Internal Medicine

## 2020-11-09 DIAGNOSIS — R1032 Left lower quadrant pain: Secondary | ICD-10-CM

## 2020-11-09 DIAGNOSIS — K4090 Unilateral inguinal hernia, without obstruction or gangrene, not specified as recurrent: Secondary | ICD-10-CM

## 2020-11-09 NOTE — Progress Notes (Signed)
Order placed for surgery referral.  

## 2020-11-09 NOTE — Telephone Encounter (Signed)
Patient called in about her results  CT scan

## 2020-11-12 ENCOUNTER — Other Ambulatory Visit: Payer: Self-pay | Admitting: Internal Medicine

## 2020-11-17 ENCOUNTER — Other Ambulatory Visit: Payer: Self-pay | Admitting: Internal Medicine

## 2020-11-17 DIAGNOSIS — S81801A Unspecified open wound, right lower leg, initial encounter: Secondary | ICD-10-CM | POA: Diagnosis not present

## 2020-11-17 DIAGNOSIS — R1032 Left lower quadrant pain: Secondary | ICD-10-CM | POA: Diagnosis not present

## 2020-11-22 ENCOUNTER — Telehealth: Payer: Self-pay | Admitting: Internal Medicine

## 2020-11-22 NOTE — Telephone Encounter (Signed)
Pt called back and didn't want to schedule appt

## 2020-11-22 NOTE — Telephone Encounter (Signed)
Left message for patient to call back and schedule Medicare Annual Wellness Visit (AWV) either virtually or in office. No detailed message left    Last AWV ;09/05/15  please schedule at anytime

## 2020-12-13 DIAGNOSIS — R1032 Left lower quadrant pain: Secondary | ICD-10-CM | POA: Diagnosis not present

## 2020-12-30 ENCOUNTER — Telehealth: Payer: Self-pay | Admitting: Internal Medicine

## 2020-12-30 NOTE — Telephone Encounter (Signed)
PT is calling in regard to Rosuvastatin Calcium 20 MG. PT states [facility] at [phone] is needing a response from [place].

## 2021-01-02 MED ORDER — ROSUVASTATIN CALCIUM 20 MG PO TABS: 20.0000 mg | ORAL_TABLET | Freq: Every day | ORAL | 1 refills | Status: DC

## 2021-01-02 NOTE — Addendum Note (Signed)
Addended byElpidio Galea T on: 01/02/2021 02:39 PM   Modules accepted: Orders

## 2021-01-02 NOTE — Telephone Encounter (Signed)
Pt called she needs the rx to go to [facility] not to [facility]

## 2021-01-04 ENCOUNTER — Other Ambulatory Visit: Payer: Self-pay

## 2021-01-04 ENCOUNTER — Other Ambulatory Visit (INDEPENDENT_AMBULATORY_CARE_PROVIDER_SITE_OTHER): Payer: Medicare Other

## 2021-01-04 DIAGNOSIS — E1165 Type 2 diabetes mellitus with hyperglycemia: Secondary | ICD-10-CM | POA: Diagnosis not present

## 2021-01-04 DIAGNOSIS — E7800 Pure hypercholesterolemia, unspecified: Secondary | ICD-10-CM | POA: Diagnosis not present

## 2021-01-04 LAB — CBC WITH DIFFERENTIAL/PLATELET
Basophils Absolute: 0 10*3/uL (ref 0.0–0.1)
Basophils Relative: 0.6 % (ref 0.0–3.0)
Eosinophils Absolute: 0 10*3/uL (ref 0.0–0.7)
Eosinophils Relative: 0 % (ref 0.0–5.0)
HCT: 42.1 % (ref 36.0–46.0)
Hemoglobin: 14.2 g/dL (ref 12.0–15.0)
Lymphocytes Relative: 34.2 % (ref 12.0–46.0)
Lymphs Abs: 1.7 10*3/uL (ref 0.7–4.0)
MCHC: 33.8 g/dL (ref 30.0–36.0)
MCV: 86.8 fl (ref 78.0–100.0)
Monocytes Absolute: 0.4 10*3/uL (ref 0.1–1.0)
Monocytes Relative: 8.2 % (ref 3.0–12.0)
Neutro Abs: 2.9 10*3/uL (ref 1.4–7.7)
Neutrophils Relative %: 57 % (ref 43.0–77.0)
Platelets: 176 10*3/uL (ref 150.0–400.0)
RBC: 4.85 Mil/uL (ref 3.87–5.11)
RDW: 15.2 % (ref 11.5–15.5)
WBC: 5.1 10*3/uL (ref 4.0–10.5)

## 2021-01-04 LAB — BASIC METABOLIC PANEL
BUN: 18 mg/dL (ref 6–23)
CO2: 25 mEq/L (ref 19–32)
Calcium: 9 mg/dL (ref 8.4–10.5)
Chloride: 102 mEq/L (ref 96–112)
Creatinine, Ser: 0.8 mg/dL (ref 0.40–1.20)
GFR: 65.43 mL/min (ref >60.00)
Glucose, Bld: 209 mg/dL — ABNORMAL HIGH (ref 70–99)
Potassium: 4.1 mEq/L (ref 3.5–5.1)
Sodium: 138 mEq/L (ref 135–145)

## 2021-01-04 LAB — HEPATIC FUNCTION PANEL
ALT: 10 U/L (ref 0–35)
AST: 12 U/L (ref 0–37)
Albumin: 4.3 g/dL (ref 3.5–5.2)
Alkaline Phosphatase: 37 U/L — ABNORMAL LOW (ref 39–117)
Bilirubin, Direct: 0.2 mg/dL (ref 0.0–0.3)
Total Bilirubin: 0.7 mg/dL (ref 0.2–1.2)
Total Protein: 7 g/dL (ref 6.0–8.3)

## 2021-01-04 LAB — LIPID PANEL
Cholesterol: 165 mg/dL (ref 0–200)
HDL: 59.5 mg/dL (ref >39.00)
LDL Cholesterol: 87 mg/dL (ref 0–99)
NonHDL: 105.87
Total CHOL/HDL Ratio: 3
Triglycerides: 92 mg/dL (ref 0.0–149.0)
VLDL: 18.4 mg/dL (ref 0.0–40.0)

## 2021-01-04 LAB — HEMOGLOBIN A1C: Hgb A1c MFr Bld: 7.7 % — ABNORMAL HIGH (ref 4.6–6.5)

## 2021-01-11 ENCOUNTER — Ambulatory Visit: Payer: Medicare Other | Admitting: Internal Medicine

## 2021-01-17 ENCOUNTER — Ambulatory Visit (INDEPENDENT_AMBULATORY_CARE_PROVIDER_SITE_OTHER): Payer: Medicare Other | Admitting: Internal Medicine

## 2021-01-17 ENCOUNTER — Other Ambulatory Visit: Payer: Self-pay

## 2021-01-17 ENCOUNTER — Encounter: Payer: Self-pay | Admitting: Internal Medicine

## 2021-01-17 DIAGNOSIS — E1165 Type 2 diabetes mellitus with hyperglycemia: Secondary | ICD-10-CM

## 2021-01-17 DIAGNOSIS — I70213 Atherosclerosis of native arteries of extremities with intermittent claudication, bilateral legs: Secondary | ICD-10-CM

## 2021-01-17 DIAGNOSIS — J479 Bronchiectasis, uncomplicated: Secondary | ICD-10-CM | POA: Diagnosis not present

## 2021-01-17 DIAGNOSIS — E1143 Type 2 diabetes mellitus with diabetic autonomic (poly)neuropathy: Secondary | ICD-10-CM

## 2021-01-17 DIAGNOSIS — K3184 Gastroparesis: Secondary | ICD-10-CM | POA: Diagnosis not present

## 2021-01-17 DIAGNOSIS — I2510 Atherosclerotic heart disease of native coronary artery without angina pectoris: Secondary | ICD-10-CM

## 2021-01-17 DIAGNOSIS — I10 Essential (primary) hypertension: Secondary | ICD-10-CM | POA: Diagnosis not present

## 2021-01-17 DIAGNOSIS — I700 Atherosclerosis of aorta: Secondary | ICD-10-CM | POA: Diagnosis not present

## 2021-01-17 DIAGNOSIS — E7800 Pure hypercholesterolemia, unspecified: Secondary | ICD-10-CM

## 2021-01-17 NOTE — Progress Notes (Signed)
Patient ID: Debra Kirk, female   DOB: [birth year], 87 y.o.   MRN: [id number]   Subjective:    Patient ID: Debra Kirk, female    DOB: March 01, 1930, 87 y.o.   MRN: [id number]  HPI This visit occurred during the SARS-CoV-2 public health emergency.  Safety protocols were in place, including screening questions prior to the visit, additional usage of staff PPE, and extensive cleaning of exam room while observing appropriate contact time as indicated for disinfecting solutions.  Patient here for a scheduled follow up. Here to follow up regarding her sugar, cholesterol, blood pressure. She is accompanied by her daughter.  History obtained from both of them.   Was evaluated by Dr Bary Castilla for left lower quadrant pain.  Is better.  No hernia.  Saw GI 10/2020 for f/u.  Recommended continuing to take omeprazole and reglan.  Eating well.  No nausea or vomiting.  No chest pain or sob.  No cough or congestion.  Overall she feels she is doing well.  Discussed labs.     Past Medical History:  Diagnosis Date  . Carotid artery disease ([place])   . Compression fracture    s/p fosamax, prolia  . Diabetes mellitus ([place])   . Femoral neuropathy    left, elevated CRP, ESR, negative FANA and ANCA  . Fibrocystic breast disease   . Hypercholesterolemia   . Hypertension    Past Surgical History:  Procedure Laterality Date  . APPENDECTOMY    . CHOLECYSTECTOMY    . HERNIA REPAIR    . INGUINAL HERNIA REPAIR     left x 1, right x 2  . OVARIAN CYST REMOVAL    . PARTIAL HYSTERECTOMY     Family History  Problem Relation Age of Onset  . Heart disease Father        myocardial infarction  . Heart disease Mother        myocardial infarction  . Diabetes Mother   . Congestive Heart Failure Mother   . Bone cancer Sister   . Diabetes Sister        x2  . Ovarian cancer Sister 3  . Heart disease Sister        CABG  . Rheum arthritis Sister   . Breast cancer Neg Hx   . Colon cancer Neg Hx    Social History    Socioeconomic History  . Marital status: Widowed    Spouse name: Not on file  . Number of children: 3  . Years of education: Not on file  . Highest education level: Not on file  Occupational History  . Not on file  Tobacco Use  . Smoking status: Former Smoker    Quit date: 08/20/1958    Years since quitting: 62.4  . Smokeless tobacco: Never Used  Substance and Sexual Activity  . Alcohol use: No    Alcohol/week: 0.0 standard drinks  . Drug use: No  . Sexual activity: Never  Other Topics Concern  . Not on file  Social History Narrative  . Not on file   Social Determinants of Health   Financial Resource Strain: Not on file  Food Insecurity: Not on file  Transportation Needs: Not on file  Physical Activity: Not on file  Stress: Not on file  Social Connections: Not on file    Outpatient Encounter Medications as of 01/17/2021  Medication Sig  . AMBULATORY NON FORMULARY MEDICATION Medication Name: Incentive Spirometry Use 10-15 times daily.  Marland Kitchen amLODipine (NORVASC) 5 MG tablet  TAKE 1 TABLET BY MOUTH DAILY  . Calcium Carbonate-Vitamin D 600-400 MG-UNIT tablet Take 1 tablet by mouth 2 (two) times daily.  . CONTOUR TEST test strip CHECK DAILY AND AS NEEDED **E11.9**  . denosumab (PROLIA) 60 MG/ML SOLN injection Inject 60 mg into the skin every 6 (six) months. Administer in upper arm, thigh, or abdomen  . dicyclomine (BENTYL) 10 MG capsule Take 10 mg by mouth 3 (three) times daily before meals.   . empagliflozin (JARDIANCE) 25 MG TABS tablet Take 25 mg by mouth daily.  Marland Kitchen glimepiride (AMARYL) 2 MG tablet TAKE 1 TABLET BY MOUTH  TWICE DAILY  . meclizine (ANTIVERT) 12.5 MG tablet Take 1 tablet (12.5 mg total) by mouth 2 (two) times daily as needed for dizziness.  . metoCLOPramide (REGLAN) 5 MG tablet Take 5 mg by mouth 2 (two) times daily.   Marland Kitchen omeprazole (PRILOSEC) 20 MG capsule Take 1 capsule (20 mg total) by mouth 2 (two) times daily.  Marland Kitchen Respiratory Therapy Supplies (FLUTTER) DEVI  Use 10-15 times daily  . rosuvastatin (CRESTOR) 20 MG tablet Take 1 tablet (20 mg total) by mouth daily.  . [DISCONTINUED] aspirin 81 MG EC tablet Take 81 mg by mouth daily.  (Patient not taking: No sig reported)   No facility-administered encounter medications on file as of 01/17/2021.    Review of Systems  Constitutional: Negative for appetite change and unexpected weight change.  HENT: Negative for congestion and sinus pressure.   Respiratory: Negative for cough, chest tightness and shortness of breath.   Cardiovascular: Negative for chest pain, palpitations and leg swelling.  Gastrointestinal: Negative for abdominal pain, diarrhea, nausea and vomiting.  Genitourinary: Negative for difficulty urinating and dysuria.  Musculoskeletal: Negative for joint swelling and myalgias.  Skin: Negative for color change and rash.  Neurological: Negative for dizziness, light-headedness and headaches.  Psychiatric/Behavioral: Negative for agitation and dysphoric mood.       Objective:    Physical Exam Vitals reviewed.  Constitutional:      General: She is not in acute distress.    Appearance: Normal appearance.  HENT:     Head: Normocephalic and atraumatic.     Right Ear: External ear normal.     Left Ear: External ear normal.  Eyes:     General: No scleral icterus.       Right eye: No discharge.        Left eye: No discharge.     Conjunctiva/sclera: Conjunctivae normal.  Neck:     Thyroid: No thyromegaly.  Cardiovascular:     Rate and Rhythm: Normal rate and regular rhythm.  Pulmonary:     Effort: No respiratory distress.     Breath sounds: Normal breath sounds. No wheezing.  Abdominal:     General: Bowel sounds are normal.     Palpations: Abdomen is soft.     Tenderness: There is no abdominal tenderness.  Musculoskeletal:        General: No swelling or tenderness.     Cervical back: Neck supple. No tenderness.  Lymphadenopathy:     Cervical: No cervical adenopathy.  Skin:     Findings: No erythema or rash.  Neurological:     Mental Status: She is alert.  Psychiatric:        Mood and Affect: Mood normal.        Behavior: Behavior normal.     BP 128/78   Pulse 79   Temp 98.5 F (36.9 C)   Ht 5' 5.98" (1.676  m)   Wt 147 lb 12.8 oz (67 kg)   SpO2 95%   BMI 23.87 kg/m  Wt Readings from Last 3 Encounters:  01/17/21 147 lb 12.8 oz (67 kg)  10/05/20 147 lb (66.7 kg)  08/10/20 150 lb (68 kg)     Lab Results  Component Value Date   WBC 5.1 01/04/2021   HGB 14.2 01/04/2021   HCT 42.1 01/04/2021   PLT 176.0 01/04/2021   GLUCOSE 209 (H) 01/04/2021   CHOL 165 01/04/2021   TRIG 92.0 01/04/2021   HDL 59.50 01/04/2021   LDLCALC 87 01/04/2021   ALT 10 01/04/2021   AST 12 01/04/2021   NA 138 01/04/2021   K 4.1 01/04/2021   CL 102 01/04/2021   CREATININE 0.80 01/04/2021   BUN 18 01/04/2021   CO2 25 01/04/2021   TSH 1.35 09/28/2020   HGBA1C 7.7 (H) 01/04/2021   MICROALBUR 1.7 09/28/2020    CT Abdomen Pelvis Wo Contrast  Result Date: 11/06/2020 CLINICAL DATA:  Left lower quadrant abdominal pain. EXAM: CT ABDOMEN AND PELVIS WITHOUT CONTRAST TECHNIQUE: Multidetector CT imaging of the abdomen and pelvis was performed following the standard protocol without IV contrast. COMPARISON:  03/26/2000 FINDINGS: Lower chest: The lung bases are clear. The heart size is normal. Hepatobiliary: The liver is normal. Status post cholecystectomy.There is no biliary ductal dilation. Pancreas: Normal contours without ductal dilatation. No peripancreatic fluid collection. Spleen: Unremarkable. Adrenals/Urinary Tract: --Adrenal glands: Unremarkable. --Right kidney/ureter: No hydronephrosis or radiopaque kidney stones. --Left kidney/ureter: No hydronephrosis or radiopaque kidney stones. --Urinary bladder: Unremarkable. Stomach/Bowel: --Stomach/Duodenum: No hiatal hernia or other gastric abnormality. Normal duodenal course and caliber. --Small bowel: Unremarkable. --Colon:  Rectosigmoid diverticulosis without acute inflammation. --Appendix: Not visualized. No right lower quadrant inflammation or free fluid. Vascular/Lymphatic: Atherosclerotic calcification is present within the non-aneurysmal abdominal aorta, without hemodynamically significant stenosis. --No retroperitoneal lymphadenopathy. --No mesenteric lymphadenopathy. --No pelvic or inguinal lymphadenopathy. Reproductive: Status post hysterectomy. No adnexal mass. Other: No ascites or free air. There is a fat containing left inguinal hernia. Musculoskeletal. There is a chronic compression fracture of the L1 vertebral body. There are degenerative changes of the lumbar spine without evidence for an acute fracture. IMPRESSION: 1. No acute abdominopelvic abnormality. 2. Rectosigmoid diverticulosis without acute inflammation. 3. Fat containing left inguinal hernia. Aortic Atherosclerosis (ICD10-I70.0). Electronically Signed   By: Constance Holster M.D.   On: 11/06/2020 22:01       Assessment & Plan:   Problem List Items Addressed This Visit    Aortic atherosclerosis ([place])    Continue crestor.       Bronchiectasis without complication (HCC)    Breathing stable.       CAD (coronary artery disease)    No chest pain.  Continue risk factor modification.       Gastroparesis due to DM (HCC)    Seeing GI.  On reglan.  Stable.       Hypercholesterolemia    Continue crestor.  Low cholesterol diet and exercise.  Follow lipid panel and liver function tests.        Relevant Orders   Hepatic function panel   Lipid panel   Hypertension    Continue amlodipine.  Blood pressure as outlined.  No changes.  Follow pressures.  Follow metabolic panel.       Relevant Orders   Basic metabolic panel   Type 2 diabetes mellitus with hyperglycemia (HCC)    Low carb diet and exercise.  Continues on jardiance and amaryl.  Follow  met b and a1c.  Sugars increased - 7.7 recent a1c.  Discussed diet and exercise.        Relevant  Orders   Hemoglobin A1c       Einar Pheasant, MD

## 2021-01-22 ENCOUNTER — Encounter: Payer: Self-pay | Admitting: Internal Medicine

## 2021-01-22 NOTE — Assessment & Plan Note (Signed)
Low carb diet and exercise.  Continues on jardiance and amaryl.  Follow met b and a1c.  Sugars increased - 7.7 recent a1c.  Discussed diet and exercise.

## 2021-01-22 NOTE — Assessment & Plan Note (Signed)
Continue crestor.  Low cholesterol diet and exercise. Follow lipid panel and liver function tests.   

## 2021-01-22 NOTE — Assessment & Plan Note (Signed)
Continue crestor 

## 2021-01-22 NOTE — Assessment & Plan Note (Signed)
Continue amlodipine.  Blood pressure as outlined.  No changes.  Follow pressures.  Follow metabolic panel.  

## 2021-01-22 NOTE — Assessment & Plan Note (Signed)
Seeing GI.  On reglan.  Stable.

## 2021-01-22 NOTE — Assessment & Plan Note (Signed)
No chest pain.  Continue risk factor modification.  

## 2021-01-22 NOTE — Assessment & Plan Note (Signed)
Breathing stable.

## 2021-02-15 DIAGNOSIS — R0602 Shortness of breath: Secondary | ICD-10-CM | POA: Diagnosis not present

## 2021-02-15 DIAGNOSIS — E7800 Pure hypercholesterolemia, unspecified: Secondary | ICD-10-CM | POA: Diagnosis not present

## 2021-02-15 DIAGNOSIS — E119 Type 2 diabetes mellitus without complications: Secondary | ICD-10-CM | POA: Diagnosis not present

## 2021-02-15 DIAGNOSIS — I10 Essential (primary) hypertension: Secondary | ICD-10-CM | POA: Diagnosis not present

## 2021-02-15 DIAGNOSIS — I25119 Atherosclerotic heart disease of native coronary artery with unspecified angina pectoris: Secondary | ICD-10-CM | POA: Diagnosis not present

## 2021-03-06 ENCOUNTER — Telehealth: Payer: Self-pay

## 2021-03-06 NOTE — Telephone Encounter (Signed)
Pt called in this morning and complained to front staff that she had a "kidney sensation" over the weekend and requested to speak with me. Patient stated that Sunday she felt like she had a yeast infection and when she went to urinate she felt a sensation come over her. Was unable to clearly describe but described as a little uncomfortable while she was urinating and felt like a chill came over her. Resolved after she was done urinating. She denies itching, burning, belly pain, back pain, painful urination, nausea, vomiting, fever, chills, vaginal discharge, no trouble emptying bladder, etc. She says that her symptoms did not persist and she has felt fine all day today so far as well. The only thing that she has done different is increased her water intake. She was calling in to see if she needed to leave a urine sample or if it was okay for her to hold off and call back if symptoms returned and became persistent. Advised that Dr Nicki Reaper is not in the office today and would send over message to covering provider.

## 2021-03-06 NOTE — Telephone Encounter (Signed)
Fyi dr scott  Her complaints are vague and im not sure if UTI and/or yeast infection. Both of which would require specimen, exam.   Since no appointments today, can you compare urgent waits at our [facility] , and adviser her to go to urgent care for evaluation today.   If only urinary concern, she can leave urine with me however for yeast infection and kidney sensation, I would feel most comfortable if she had a visit and in person exam

## 2021-03-06 NOTE — Telephone Encounter (Signed)
Patient aware and says that she does not need to go to urgent care because she has still not had any more symptoms as of now. Patient is going to continue to monitor and will be evaluated if symptoms return.

## 2021-03-07 DIAGNOSIS — R300 Dysuria: Secondary | ICD-10-CM | POA: Diagnosis not present

## 2021-03-07 NOTE — Telephone Encounter (Signed)
Called patient. Phone was busy

## 2021-03-07 NOTE — Telephone Encounter (Signed)
Please call and confirm pt doing ok.  If symptoms, can leave urine sample and schedule virtual visit.

## 2021-03-09 NOTE — Telephone Encounter (Signed)
Doing ok. Patient will let us know if she has anymore issues.

## 2021-03-27 ENCOUNTER — Telehealth: Payer: Self-pay | Admitting: Internal Medicine

## 2021-03-27 DIAGNOSIS — S32010D Wedge compression fracture of first lumbar vertebra, subsequent encounter for fracture with routine healing: Secondary | ICD-10-CM

## 2021-03-27 DIAGNOSIS — M810 Age-related osteoporosis without current pathological fracture: Secondary | ICD-10-CM

## 2021-03-27 NOTE — Telephone Encounter (Signed)
Patient coming in for labs on 04/12/21 can we add Vit D level  for Prolia.

## 2021-03-28 NOTE — Telephone Encounter (Signed)
Order signed for vitamin D to be checked.  Add on to orders.

## 2021-04-03 ENCOUNTER — Other Ambulatory Visit: Payer: Self-pay | Admitting: Internal Medicine

## 2021-04-12 ENCOUNTER — Ambulatory Visit (INDEPENDENT_AMBULATORY_CARE_PROVIDER_SITE_OTHER): Payer: Medicare Other

## 2021-04-12 ENCOUNTER — Other Ambulatory Visit: Payer: Self-pay

## 2021-04-12 ENCOUNTER — Other Ambulatory Visit (INDEPENDENT_AMBULATORY_CARE_PROVIDER_SITE_OTHER): Payer: Medicare Other

## 2021-04-12 DIAGNOSIS — M810 Age-related osteoporosis without current pathological fracture: Secondary | ICD-10-CM | POA: Diagnosis not present

## 2021-04-12 DIAGNOSIS — I10 Essential (primary) hypertension: Secondary | ICD-10-CM

## 2021-04-12 DIAGNOSIS — S32010D Wedge compression fracture of first lumbar vertebra, subsequent encounter for fracture with routine healing: Secondary | ICD-10-CM | POA: Diagnosis not present

## 2021-04-12 DIAGNOSIS — E1165 Type 2 diabetes mellitus with hyperglycemia: Secondary | ICD-10-CM | POA: Diagnosis not present

## 2021-04-12 DIAGNOSIS — E7800 Pure hypercholesterolemia, unspecified: Secondary | ICD-10-CM | POA: Diagnosis not present

## 2021-04-12 LAB — BASIC METABOLIC PANEL
BUN: 19 mg/dL (ref 6–23)
CO2: 25 mEq/L (ref 19–32)
Calcium: 9.6 mg/dL (ref 8.4–10.5)
Chloride: 99 mEq/L (ref 96–112)
Creatinine, Ser: 0.99 mg/dL (ref 0.40–1.20)
GFR: 50.58 mL/min — ABNORMAL LOW (ref >60.00)
Glucose, Bld: 220 mg/dL — ABNORMAL HIGH (ref 70–99)
Potassium: 3.7 mEq/L (ref 3.5–5.1)
Sodium: 137 mEq/L (ref 135–145)

## 2021-04-12 LAB — LIPID PANEL
Cholesterol: 168 mg/dL (ref 0–200)
HDL: 56 mg/dL (ref >39.00)
LDL Cholesterol: 88 mg/dL (ref 0–99)
NonHDL: 111.55
Total CHOL/HDL Ratio: 3
Triglycerides: 119 mg/dL (ref 0.0–149.0)
VLDL: 23.8 mg/dL (ref 0.0–40.0)

## 2021-04-12 LAB — HEPATIC FUNCTION PANEL
ALT: 11 U/L (ref 0–35)
AST: 14 U/L (ref 0–37)
Albumin: 4.1 g/dL (ref 3.5–5.2)
Alkaline Phosphatase: 43 U/L (ref 39–117)
Bilirubin, Direct: 0.2 mg/dL (ref 0.0–0.3)
Total Bilirubin: 0.8 mg/dL (ref 0.2–1.2)
Total Protein: 7 g/dL (ref 6.0–8.3)

## 2021-04-12 LAB — VITAMIN D 25 HYDROXY (VIT D DEFICIENCY, FRACTURES): VITD: 44.72 ng/mL (ref 30.00–100.00)

## 2021-04-12 LAB — HEMOGLOBIN A1C: Hgb A1c MFr Bld: 7.6 % — ABNORMAL HIGH (ref 4.6–6.5)

## 2021-04-12 MED ORDER — DENOSUMAB 60 MG/ML ~~LOC~~ SOSY
60.0000 mg | PREFILLED_SYRINGE | Freq: Once | SUBCUTANEOUS | Status: AC
  Administered 2021-04-12: 60 mg via SUBCUTANEOUS

## 2021-04-12 NOTE — Progress Notes (Signed)
Pt presented for a Prolia injection to the Left arm. Pt voiced no concern or discomfort during time of the injection.

## 2021-04-19 ENCOUNTER — Telehealth: Payer: Self-pay | Admitting: Internal Medicine

## 2021-04-19 ENCOUNTER — Ambulatory Visit (INDEPENDENT_AMBULATORY_CARE_PROVIDER_SITE_OTHER): Payer: Medicare Other | Admitting: Internal Medicine

## 2021-04-19 ENCOUNTER — Encounter: Payer: Self-pay | Admitting: Internal Medicine

## 2021-04-19 ENCOUNTER — Other Ambulatory Visit: Payer: Self-pay

## 2021-04-19 VITALS — BP 122/70 | HR 78 | Temp 98.6°F | Ht 65.98 in | Wt 149.8 lb

## 2021-04-19 DIAGNOSIS — I2510 Atherosclerotic heart disease of native coronary artery without angina pectoris: Secondary | ICD-10-CM | POA: Diagnosis not present

## 2021-04-19 DIAGNOSIS — G6289 Other specified polyneuropathies: Secondary | ICD-10-CM

## 2021-04-19 DIAGNOSIS — I10 Essential (primary) hypertension: Secondary | ICD-10-CM

## 2021-04-19 DIAGNOSIS — Z23 Encounter for immunization: Secondary | ICD-10-CM

## 2021-04-19 DIAGNOSIS — R55 Syncope and collapse: Secondary | ICD-10-CM

## 2021-04-19 DIAGNOSIS — I700 Atherosclerosis of aorta: Secondary | ICD-10-CM | POA: Diagnosis not present

## 2021-04-19 DIAGNOSIS — J479 Bronchiectasis, uncomplicated: Secondary | ICD-10-CM | POA: Diagnosis not present

## 2021-04-19 DIAGNOSIS — K3184 Gastroparesis: Secondary | ICD-10-CM

## 2021-04-19 DIAGNOSIS — M810 Age-related osteoporosis without current pathological fracture: Secondary | ICD-10-CM | POA: Diagnosis not present

## 2021-04-19 DIAGNOSIS — I70213 Atherosclerosis of native arteries of extremities with intermittent claudication, bilateral legs: Secondary | ICD-10-CM

## 2021-04-19 DIAGNOSIS — E1143 Type 2 diabetes mellitus with diabetic autonomic (poly)neuropathy: Secondary | ICD-10-CM | POA: Diagnosis not present

## 2021-04-19 DIAGNOSIS — E7800 Pure hypercholesterolemia, unspecified: Secondary | ICD-10-CM | POA: Diagnosis not present

## 2021-04-19 DIAGNOSIS — E1165 Type 2 diabetes mellitus with hyperglycemia: Secondary | ICD-10-CM | POA: Diagnosis not present

## 2021-04-19 NOTE — Telephone Encounter (Signed)
Patient needing lab orders placed for 08/16/21 fasting lab appointment. Was requested per Patient check out note

## 2021-04-19 NOTE — Progress Notes (Signed)
Patient ID: Debra Kirk, female   DOB: [birth year], 87 y.o.   MRN: [id number]   Subjective:    Patient ID: Debra Kirk, female    DOB: [birth year], 87 y.o.   MRN: [id number]  This visit occurred during the SARS-CoV-2 public health emergency.  Safety protocols were in place, including screening questions prior to the visit, additional usage of staff PPE, and extensive cleaning of exam room while observing appropriate contact time as indicated for disinfecting solutions.   Patient here for a scheduled follow up.   HPI Here to follow up regarding her blood pressure, cholesterol and diabetes.  She is accompanied by her daughter.  History obtained from both of them.  End of July, was changing clothes.  Standing.  States it felt like something "knocked her backwards".  She fell back into her nightstand.  No head injury.  No residual injury.  No chest pain or sob.  She did report her "head felt a little off, like an inner ear feeling".  No headache.  No dizziness.  No LOC.  No near syncopal feeling.  Felt tired after, but no other residual symptoms.  Has not had another episode.  No chest pain or sob reported.  No acid reflux or abdominal pain.  Bowels moving.  Saw Dr Saralyn Pilar for f/u 02/15/21 - f/u f/u near syncopal episode.  Telemetry revealed predominant sinus rhythm with first degree AV block.  Metoprolol was stopped.  72 hour holter - SR with occasional premature atrial contractions and infrequent dropped beats due to Mobitz 1 second degree AV block.  Elected to monitor and defer pacemaker placement.     Past Medical History:  Diagnosis Date   Carotid artery disease ([place])    Compression fracture    s/p fosamax, prolia   Diabetes mellitus (HCC)    Femoral neuropathy    left, elevated CRP, ESR, negative FANA and ANCA   Fibrocystic breast disease    Hypercholesterolemia    Hypertension    Past Surgical History:  Procedure Laterality Date   APPENDECTOMY     CHOLECYSTECTOMY     HERNIA  REPAIR     INGUINAL HERNIA REPAIR     left x 1, right x 2   OVARIAN CYST REMOVAL     PARTIAL HYSTERECTOMY     Family History  Problem Relation Age of Onset   Heart disease Father        myocardial infarction   Heart disease Mother        myocardial infarction   Diabetes Mother    Congestive Heart Failure Mother    Bone cancer Sister    Diabetes Sister        x2   Ovarian cancer Sister 5   Heart disease Sister        CABG   Rheum arthritis Sister    Breast cancer Neg Hx    Colon cancer Neg Hx    Social History   Socioeconomic History   Marital status: Widowed    Spouse name: Not on file   Number of children: 3   Years of education: Not on file   Highest education level: Not on file  Occupational History   Not on file  Tobacco Use   Smoking status: Former    Types: Cigarettes    Quit date: 08/20/1958    Years since quitting: 62.7   Smokeless tobacco: Never  Substance and Sexual Activity   Alcohol use: No    Alcohol/week: 0.0 standard  drinks   Drug use: No   Sexual activity: Never  Other Topics Concern   Not on file  Social History Narrative   Not on file   Social Determinants of Health   Financial Resource Strain: Not on file  Food Insecurity: Not on file  Transportation Needs: Not on file  Physical Activity: Not on file  Stress: Not on file  Social Connections: Not on file    Review of Systems  Constitutional:  Negative for appetite change and unexpected weight change.  HENT:  Negative for congestion and sneezing.   Respiratory:  Negative for cough, chest tightness and shortness of breath.   Cardiovascular:  Negative for chest pain, palpitations and leg swelling.  Gastrointestinal:  Negative for abdominal pain, diarrhea, nausea and vomiting.  Genitourinary:  Negative for difficulty urinating and dysuria.  Musculoskeletal:  Negative for joint swelling and myalgias.  Skin:  Negative for color change and rash.  Neurological:  Negative for dizziness,  light-headedness and headaches.  Psychiatric/Behavioral:  Negative for agitation and dysphoric mood.       Objective:     BP 122/70   Pulse 78   Temp 98.6 F (37 C)   Ht 5' 5.98" (1.676 m)   Wt 149 lb 12.8 oz (67.9 kg)   SpO2 96%   BMI 24.19 kg/m  Wt Readings from Last 3 Encounters:  04/19/21 149 lb 12.8 oz (67.9 kg)  01/17/21 147 lb 12.8 oz (67 kg)  10/05/20 147 lb (66.7 kg)    Physical Exam Vitals reviewed.  Constitutional:      General: She is not in acute distress.    Appearance: Normal appearance.  HENT:     Head: Normocephalic and atraumatic.     Right Ear: External ear normal.     Left Ear: External ear normal.  Eyes:     General: No scleral icterus.       Right eye: No discharge.        Left eye: No discharge.     Conjunctiva/sclera: Conjunctivae normal.  Neck:     Thyroid: No thyromegaly.  Cardiovascular:     Rate and Rhythm: Normal rate and regular rhythm.  Pulmonary:     Effort: No respiratory distress.     Breath sounds: Normal breath sounds. No wheezing.  Abdominal:     General: Bowel sounds are normal.     Palpations: Abdomen is soft.     Tenderness: There is no abdominal tenderness.  Musculoskeletal:        General: No swelling or tenderness.     Cervical back: Neck supple. No tenderness.  Lymphadenopathy:     Cervical: No cervical adenopathy.  Skin:    Findings: No erythema or rash.  Neurological:     Mental Status: She is alert.  Psychiatric:        Mood and Affect: Mood normal.        Behavior: Behavior normal.     Outpatient Encounter Medications as of 04/19/2021  Medication Sig   AMBULATORY NON FORMULARY MEDICATION Medication Name: Incentive Spirometry Use 10-15 times daily.   amLODipine (NORVASC) 5 MG tablet TAKE 1 TABLET BY MOUTH EVERY DAY   Calcium Carbonate-Vitamin D 600-400 MG-UNIT tablet Take 1 tablet by mouth 2 (two) times daily.   CONTOUR TEST test strip CHECK DAILY AND AS NEEDED **E11.9**   denosumab (PROLIA) 60 MG/ML  SOLN injection Inject 60 mg into the skin every 6 (six) months. Administer in upper arm, thigh, or abdomen  dicyclomine (BENTYL) 10 MG capsule Take 10 mg by mouth 3 (three) times daily before meals.    empagliflozin (JARDIANCE) 25 MG TABS tablet Take 25 mg by mouth daily.   glimepiride (AMARYL) 2 MG tablet TAKE 1 TABLET BY MOUTH  TWICE DAILY   meclizine (ANTIVERT) 12.5 MG tablet Take 1 tablet (12.5 mg total) by mouth 2 (two) times daily as needed for dizziness.   metoCLOPramide (REGLAN) 5 MG tablet Take 5 mg by mouth 2 (two) times daily.    omeprazole (PRILOSEC) 20 MG capsule Take 1 capsule (20 mg total) by mouth 2 (two) times daily.   Respiratory Therapy Supplies (FLUTTER) DEVI Use 10-15 times daily   rosuvastatin (CRESTOR) 20 MG tablet Take 1 tablet (20 mg total) by mouth daily.   No facility-administered encounter medications on file as of 04/19/2021.     Lab Results  Component Value Date   WBC 5.1 01/04/2021   HGB 14.2 01/04/2021   HCT 42.1 01/04/2021   PLT 176.0 01/04/2021   GLUCOSE 220 (H) 04/12/2021   CHOL 168 04/12/2021   TRIG 119.0 04/12/2021   HDL 56.00 04/12/2021   LDLCALC 88 04/12/2021   ALT 11 04/12/2021   AST 14 04/12/2021   NA 137 04/12/2021   K 3.7 04/12/2021   CL 99 04/12/2021   CREATININE 0.99 04/12/2021   BUN 19 04/12/2021   CO2 25 04/12/2021   TSH 1.35 09/28/2020   HGBA1C 7.6 (H) 04/12/2021   MICROALBUR 1.7 09/28/2020    CT Abdomen Pelvis Wo Contrast  Result Date: 11/06/2020 CLINICAL DATA:  Left lower quadrant abdominal pain. EXAM: CT ABDOMEN AND PELVIS WITHOUT CONTRAST TECHNIQUE: Multidetector CT imaging of the abdomen and pelvis was performed following the standard protocol without IV contrast. COMPARISON:  03/26/2000 FINDINGS: Lower chest: The lung bases are clear. The heart size is normal. Hepatobiliary: The liver is normal. Status post cholecystectomy.There is no biliary ductal dilation. Pancreas: Normal contours without ductal dilatation. No  peripancreatic fluid collection. Spleen: Unremarkable. Adrenals/Urinary Tract: --Adrenal glands: Unremarkable. --Right kidney/ureter: No hydronephrosis or radiopaque kidney stones. --Left kidney/ureter: No hydronephrosis or radiopaque kidney stones. --Urinary bladder: Unremarkable. Stomach/Bowel: --Stomach/Duodenum: No hiatal hernia or other gastric abnormality. Normal duodenal course and caliber. --Small bowel: Unremarkable. --Colon: Rectosigmoid diverticulosis without acute inflammation. --Appendix: Not visualized. No right lower quadrant inflammation or free fluid. Vascular/Lymphatic: Atherosclerotic calcification is present within the non-aneurysmal abdominal aorta, without hemodynamically significant stenosis. --No retroperitoneal lymphadenopathy. --No mesenteric lymphadenopathy. --No pelvic or inguinal lymphadenopathy. Reproductive: Status post hysterectomy. No adnexal mass. Other: No ascites or free air. There is a fat containing left inguinal hernia. Musculoskeletal. There is a chronic compression fracture of the L1 vertebral body. There are degenerative changes of the lumbar spine without evidence for an acute fracture. IMPRESSION: 1. No acute abdominopelvic abnormality. 2. Rectosigmoid diverticulosis without acute inflammation. 3. Fat containing left inguinal hernia. Aortic Atherosclerosis (ICD10-I70.0). Electronically Signed   By: Constance Holster M.D.   On: 11/06/2020 22:01       Assessment & Plan:   Problem List Items Addressed This Visit     Aortic atherosclerosis ([place])    Continue crestor.       Atherosclerosis of native arteries of extremity with intermittent claudication [facility])    Has been evaluated by Dr Delana Meyer.  Continue crestor.  Stable.       Bronchiectasis without complication (HCC)    Breathing stable.       CAD (coronary artery disease)    No chest pain.  Continue risk  factor modification.       Gastroparesis due to DM (HCC)    Seeing GI.  On reglan.  Stable.        Hypercholesterolemia    Continue crestor.  Low cholesterol diet and exercise.  Follow lipid panel and liver function tests.        Relevant Orders   Lipid panel   Hepatic function panel   Hypertension    Continue amlodipine.  Blood pressure as outlined.  No changes.  Follow pressures.  Follow metabolic panel.       Relevant Orders   Basic metabolic panel   Osteoporosis    Last prolia - 04/12/21.       Peripheral neuropathy    Overall stable.       Syncope    Saw Dr Saralyn Pilar for f/u 02/15/21 - f/u f/u near syncopal episode.  Telemetry revealed predominant sinus rhythm with first degree AV block.  Metoprolol was stopped.  72 hour holter - SR with occasional premature atrial contractions and infrequent dropped beats due to Mobitz 1 second degree AV block.  Elected to monitor and defer pacemaker placement.  She had the recent episode where she "felt like she was knocked backwards".  Unclear etiology.  No LOC.  No dizziness or light headedness that preceded the fall.  No chest pain.  Discussed further w/up.  Discussed possible rhythm issue, etc.  Will have cardiology reevaluate with question of need for further w/up and evaluation.       Type 2 diabetes mellitus with hyperglycemia (HCC)    Low carb diet and exercise.  Continues on jardiance and amaryl.  Follow met b and a1c.  7.6 - recent a1c.  Discussed diet and exercise.        Relevant Orders   Hemoglobin A1c   Other Visit Diagnoses     Need for influenza vaccination    -  Primary   Relevant Orders   Flu Vaccine QUAD High Dose(Fluad) (Completed)        Einar Pheasant, MD

## 2021-04-20 NOTE — Telephone Encounter (Signed)
Orders placed for labs

## 2021-04-24 ENCOUNTER — Encounter: Payer: Self-pay | Admitting: Internal Medicine

## 2021-04-24 ENCOUNTER — Telehealth: Payer: Self-pay | Admitting: Internal Medicine

## 2021-04-24 DIAGNOSIS — M810 Age-related osteoporosis without current pathological fracture: Secondary | ICD-10-CM | POA: Insufficient documentation

## 2021-04-24 NOTE — Assessment & Plan Note (Signed)
Continue amlodipine.  Blood pressure as outlined.  No changes.  Follow pressures.  Follow metabolic panel.  

## 2021-04-24 NOTE — Assessment & Plan Note (Signed)
Continue crestor.  Low cholesterol diet and exercise. Follow lipid panel and liver function tests.   

## 2021-04-24 NOTE — Assessment & Plan Note (Signed)
Has been evaluated by Dr Schnier.  Continue crestor.  Stable.  

## 2021-04-24 NOTE — Assessment & Plan Note (Signed)
Saw Dr Saralyn Pilar for f/u 02/15/21 - f/u f/u near syncopal episode.  Telemetry revealed predominant sinus rhythm with first degree AV block.  Metoprolol was stopped.  72 hour holter - SR with occasional premature atrial contractions and infrequent dropped beats due to Mobitz 1 second degree AV block.  Elected to monitor and defer pacemaker placement.  She had the recent episode where she "felt like she was knocked backwards".  Unclear etiology.  No LOC.  No dizziness or light headedness that preceded the fall.  No chest pain.  Discussed further w/up.  Discussed possible rhythm issue, etc.  Will have cardiology reevaluate with question of need for further w/up and evaluation.

## 2021-04-24 NOTE — Assessment & Plan Note (Signed)
Low carb diet and exercise.  Continues on jardiance and amaryl.  Follow met b and a1c.  7.6 - recent a1c.  Discussed diet and exercise.

## 2021-04-24 NOTE — Assessment & Plan Note (Signed)
Overall stable.   

## 2021-04-24 NOTE — Assessment & Plan Note (Signed)
Seeing GI.  On reglan.  Stable.

## 2021-04-24 NOTE — Assessment & Plan Note (Signed)
No chest pain.  Continue risk factor modification.  

## 2021-04-24 NOTE — Assessment & Plan Note (Signed)
Last prolia - 04/12/21.

## 2021-04-24 NOTE — Assessment & Plan Note (Signed)
Breathing stable.

## 2021-04-24 NOTE — Assessment & Plan Note (Signed)
Continue crestor 

## 2021-04-24 NOTE — Telephone Encounter (Signed)
Saw Dr Saralyn Pilar for f/u 02/15/21 - f/u f/u near syncopal episode.  Telemetry revealed predominant sinus rhythm with first degree AV block.  Metoprolol was stopped.  72 hour holter - SR with occasional premature atrial contractions and infrequent dropped beats due to Mobitz 1 second degree AV block.  Elected to monitor and defer pacemaker placement.  We saw her in f/u and she had an episode where she was standing and felt like she was knocked back - stumbled back and hit a nightstand.  Unclear etiology.  Would like f/u appt with Dr Saralyn Pilar - to see if feels need for any further cardiac evaluation - given rhythm issue noted on previous holter.  (Please schedule earlier f/u appt)

## 2021-04-26 DIAGNOSIS — E119 Type 2 diabetes mellitus without complications: Secondary | ICD-10-CM | POA: Diagnosis not present

## 2021-04-26 NOTE — Telephone Encounter (Signed)
Patient scheduled for 05/08/21 at 11:45 with Dr Saralyn Pilar. Patient is aware of appt date and time.

## 2021-05-05 DIAGNOSIS — H353221 Exudative age-related macular degeneration, left eye, with active choroidal neovascularization: Secondary | ICD-10-CM | POA: Diagnosis not present

## 2021-05-05 DIAGNOSIS — H353211 Exudative age-related macular degeneration, right eye, with active choroidal neovascularization: Secondary | ICD-10-CM | POA: Diagnosis not present

## 2021-05-08 DIAGNOSIS — R55 Syncope and collapse: Secondary | ICD-10-CM | POA: Diagnosis not present

## 2021-05-08 DIAGNOSIS — E119 Type 2 diabetes mellitus without complications: Secondary | ICD-10-CM | POA: Diagnosis not present

## 2021-05-08 DIAGNOSIS — R0602 Shortness of breath: Secondary | ICD-10-CM | POA: Diagnosis not present

## 2021-05-08 DIAGNOSIS — I25119 Atherosclerotic heart disease of native coronary artery with unspecified angina pectoris: Secondary | ICD-10-CM | POA: Diagnosis not present

## 2021-05-08 DIAGNOSIS — E7800 Pure hypercholesterolemia, unspecified: Secondary | ICD-10-CM | POA: Diagnosis not present

## 2021-05-08 DIAGNOSIS — I10 Essential (primary) hypertension: Secondary | ICD-10-CM | POA: Diagnosis not present

## 2021-05-09 DIAGNOSIS — H353221 Exudative age-related macular degeneration, left eye, with active choroidal neovascularization: Secondary | ICD-10-CM | POA: Diagnosis not present

## 2021-06-13 DIAGNOSIS — H353211 Exudative age-related macular degeneration, right eye, with active choroidal neovascularization: Secondary | ICD-10-CM | POA: Diagnosis not present

## 2021-06-13 DIAGNOSIS — H353221 Exudative age-related macular degeneration, left eye, with active choroidal neovascularization: Secondary | ICD-10-CM | POA: Diagnosis not present

## 2021-07-12 ENCOUNTER — Other Ambulatory Visit: Payer: Self-pay | Admitting: Internal Medicine

## 2021-07-25 DIAGNOSIS — H353211 Exudative age-related macular degeneration, right eye, with active choroidal neovascularization: Secondary | ICD-10-CM | POA: Diagnosis not present

## 2021-07-25 DIAGNOSIS — H353221 Exudative age-related macular degeneration, left eye, with active choroidal neovascularization: Secondary | ICD-10-CM | POA: Diagnosis not present

## 2021-07-29 DIAGNOSIS — R051 Acute cough: Secondary | ICD-10-CM | POA: Diagnosis not present

## 2021-07-29 DIAGNOSIS — J0100 Acute maxillary sinusitis, unspecified: Secondary | ICD-10-CM | POA: Diagnosis not present

## 2021-07-29 DIAGNOSIS — E119 Type 2 diabetes mellitus without complications: Secondary | ICD-10-CM | POA: Diagnosis not present

## 2021-08-16 ENCOUNTER — Other Ambulatory Visit: Payer: Self-pay | Admitting: Internal Medicine

## 2021-08-16 ENCOUNTER — Other Ambulatory Visit: Payer: Self-pay

## 2021-08-16 ENCOUNTER — Other Ambulatory Visit (INDEPENDENT_AMBULATORY_CARE_PROVIDER_SITE_OTHER): Payer: Medicare Other

## 2021-08-16 DIAGNOSIS — R0602 Shortness of breath: Secondary | ICD-10-CM | POA: Diagnosis not present

## 2021-08-16 DIAGNOSIS — E7800 Pure hypercholesterolemia, unspecified: Secondary | ICD-10-CM | POA: Diagnosis not present

## 2021-08-16 DIAGNOSIS — E1165 Type 2 diabetes mellitus with hyperglycemia: Secondary | ICD-10-CM

## 2021-08-16 DIAGNOSIS — K760 Fatty (change of) liver, not elsewhere classified: Secondary | ICD-10-CM

## 2021-08-16 DIAGNOSIS — E119 Type 2 diabetes mellitus without complications: Secondary | ICD-10-CM | POA: Diagnosis not present

## 2021-08-16 DIAGNOSIS — I25119 Atherosclerotic heart disease of native coronary artery with unspecified angina pectoris: Secondary | ICD-10-CM | POA: Diagnosis not present

## 2021-08-16 DIAGNOSIS — R079 Chest pain, unspecified: Secondary | ICD-10-CM | POA: Diagnosis not present

## 2021-08-16 DIAGNOSIS — R55 Syncope and collapse: Secondary | ICD-10-CM | POA: Diagnosis not present

## 2021-08-16 DIAGNOSIS — I10 Essential (primary) hypertension: Secondary | ICD-10-CM

## 2021-08-16 LAB — HEPATIC FUNCTION PANEL
ALT: 10 U/L (ref 0–35)
AST: 13 U/L (ref 0–37)
Albumin: 3.9 g/dL (ref 3.5–5.2)
Alkaline Phosphatase: 47 U/L (ref 39–117)
Bilirubin, Direct: 0.2 mg/dL (ref 0.0–0.3)
Total Bilirubin: 1 mg/dL (ref 0.2–1.2)
Total Protein: 7 g/dL (ref 6.0–8.3)

## 2021-08-16 LAB — LIPID PANEL
Cholesterol: 167 mg/dL (ref 0–200)
HDL: 52.4 mg/dL (ref >39.00)
LDL Cholesterol: 93 mg/dL (ref 0–99)
NonHDL: 114.93
Total CHOL/HDL Ratio: 3
Triglycerides: 110 mg/dL (ref 0.0–149.0)
VLDL: 22 mg/dL (ref 0.0–40.0)

## 2021-08-16 LAB — BASIC METABOLIC PANEL
BUN: 16 mg/dL (ref 6–23)
CO2: 24 mEq/L (ref 19–32)
Calcium: 8.9 mg/dL (ref 8.4–10.5)
Chloride: 99 mEq/L (ref 96–112)
Creatinine, Ser: 0.87 mg/dL (ref 0.40–1.20)
GFR: 58.91 mL/min — ABNORMAL LOW (ref >60.00)
Glucose, Bld: 221 mg/dL — ABNORMAL HIGH (ref 70–99)
Potassium: 4 mEq/L (ref 3.5–5.1)
Sodium: 135 mEq/L (ref 135–145)

## 2021-08-16 LAB — HEMOGLOBIN A1C: Hgb A1c MFr Bld: 7.9 % — ABNORMAL HIGH (ref 4.6–6.5)

## 2021-08-16 NOTE — Progress Notes (Signed)
°  Anti-hepatitis C virus antibody. ?Hepatitis A IgG. ?Hepatitis B surface antigen, surface antibody, and core antibody. ?Plasma iron, ferritin, and total iron binding capacity. ?Serum gammaglobulin level, antinuclear antibody, antismooth muscle antibody, and anti-liver/kidney microsomal antibody-1  Orders placed for f/u labs.

## 2021-08-23 ENCOUNTER — Ambulatory Visit (INDEPENDENT_AMBULATORY_CARE_PROVIDER_SITE_OTHER): Payer: Medicare Other | Admitting: Internal Medicine

## 2021-08-23 ENCOUNTER — Encounter: Payer: Self-pay | Admitting: Internal Medicine

## 2021-08-23 ENCOUNTER — Other Ambulatory Visit: Payer: Self-pay

## 2021-08-23 DIAGNOSIS — E1143 Type 2 diabetes mellitus with diabetic autonomic (poly)neuropathy: Secondary | ICD-10-CM | POA: Diagnosis not present

## 2021-08-23 DIAGNOSIS — K3184 Gastroparesis: Secondary | ICD-10-CM | POA: Diagnosis not present

## 2021-08-23 DIAGNOSIS — E1165 Type 2 diabetes mellitus with hyperglycemia: Secondary | ICD-10-CM | POA: Diagnosis not present

## 2021-08-23 DIAGNOSIS — G6289 Other specified polyneuropathies: Secondary | ICD-10-CM

## 2021-08-23 DIAGNOSIS — I10 Essential (primary) hypertension: Secondary | ICD-10-CM

## 2021-08-23 DIAGNOSIS — J479 Bronchiectasis, uncomplicated: Secondary | ICD-10-CM

## 2021-08-23 DIAGNOSIS — M810 Age-related osteoporosis without current pathological fracture: Secondary | ICD-10-CM | POA: Diagnosis not present

## 2021-08-23 DIAGNOSIS — I2510 Atherosclerotic heart disease of native coronary artery without angina pectoris: Secondary | ICD-10-CM

## 2021-08-23 DIAGNOSIS — I700 Atherosclerosis of aorta: Secondary | ICD-10-CM | POA: Diagnosis not present

## 2021-08-23 DIAGNOSIS — E7800 Pure hypercholesterolemia, unspecified: Secondary | ICD-10-CM | POA: Diagnosis not present

## 2021-08-23 DIAGNOSIS — I70213 Atherosclerosis of native arteries of extremities with intermittent claudication, bilateral legs: Secondary | ICD-10-CM | POA: Diagnosis not present

## 2021-08-23 NOTE — Assessment & Plan Note (Signed)
Stable. Desires no medication.  Discussed.  

## 2021-08-23 NOTE — Assessment & Plan Note (Signed)
Continue crestor 

## 2021-08-23 NOTE — Assessment & Plan Note (Signed)
Breathing stable.

## 2021-08-23 NOTE — Assessment & Plan Note (Signed)
No chest pain.  Continue risk factor modification.  

## 2021-08-23 NOTE — Assessment & Plan Note (Signed)
Has been evaluated by Dr Schnier.  Continue crestor.  Stable.  

## 2021-08-23 NOTE — Assessment & Plan Note (Signed)
Seeing GI.  On reglan.  Stable.

## 2021-08-23 NOTE — Assessment & Plan Note (Signed)
Low carb diet and exercise.  Continues on jardiance and amaryl.  Follow met b and a1c.  7.9 - recent a1c.  Discussed diet and exercise.

## 2021-08-23 NOTE — Progress Notes (Signed)
Patient ID: [place], female   DOB: 01-Dec-1929, 88 y.o.   MRN: [id number]   Subjective:    Patient ID: [name], female    DOB: 10-19-1929, 88 y.o.   MRN: [id number]  This visit occurred during the SARS-CoV-2 public health emergency.  Safety protocols were in place, including screening questions prior to the visit, additional usage of staff PPE, and extensive cleaning of exam room while observing appropriate contact time as indicated for disinfecting solutions.   Patient here for a scheduled follow up.  Chief Complaint  Patient presents with   Diabetes   Hypertension   Hyperlipidemia   .   HPI Just saw cardiology (Dr Saralyn Pilar) - f/u previous near syncope.  Note reviewed.  Holter - PACs and infrequent mobitz I second degree heart block.  Felt stable.  Recommended f/u in 6 months.  No changes.  No chest pain.  Breathing stable.  No acid reflux reported.  No abdominal pain.  Bowels moving.  Recently evaluated in acute care for cough and congestion.  Treated with prednisone and abx.  Better/resolved.  Had fall - 07/22/21.  Outside - sat down.  Some right hip discomfort.  No bruising.  No head injury.  No significant residual problems.  No other falls.  No dizziness or light headedness.  Seeing ophthalmology for f/u macular degeneration.  Discussed labs.  Has been eating more sweets and recently on prednisone - a1c increased to 7.9.    Past Medical History:  Diagnosis Date   Carotid artery disease ([place])    Compression fracture    s/p fosamax, prolia   Diabetes mellitus (HCC)    Femoral neuropathy    left, elevated CRP, ESR, negative FANA and ANCA   Fibrocystic breast disease    Hypercholesterolemia    Hypertension    Past Surgical History:  Procedure Laterality Date   APPENDECTOMY     CHOLECYSTECTOMY     HERNIA REPAIR     INGUINAL HERNIA REPAIR     left x 1, right x 2   OVARIAN CYST REMOVAL     PARTIAL HYSTERECTOMY     Family History  Problem Relation Age of Onset    Heart disease Father        myocardial infarction   Heart disease Mother        myocardial infarction   Diabetes Mother    Congestive Heart Failure Mother    Bone cancer Sister    Diabetes Sister        x2   Ovarian cancer Sister 48   Heart disease Sister        CABG   Rheum arthritis Sister    Breast cancer Neg Hx    Colon cancer Neg Hx    Social History   Socioeconomic History   Marital status: Widowed    Spouse name: Not on file   Number of children: 3   Years of education: Not on file   Highest education level: Not on file  Occupational History   Not on file  Tobacco Use   Smoking status: Former    Types: Cigarettes    Quit date: 08/20/1958    Years since quitting: 63.0   Smokeless tobacco: Never  Substance and Sexual Activity   Alcohol use: No    Alcohol/week: 0.0 standard drinks   Drug use: No   Sexual activity: Never  Other Topics Concern   Not on file  Social History Narrative   Not on file  Social Determinants of Health   Financial Resource Strain: Not on file  Food Insecurity: Not on file  Transportation Needs: Not on file  Physical Activity: Not on file  Stress: Not on file  Social Connections: Not on file     Review of Systems  Constitutional:  Negative for appetite change and unexpected weight change.  HENT:  Negative for congestion and sinus pressure.   Respiratory:  Negative for cough, chest tightness and shortness of breath.   Cardiovascular:  Negative for chest pain, palpitations and leg swelling.  Gastrointestinal:  Negative for abdominal pain, diarrhea, nausea and vomiting.  Genitourinary:  Negative for difficulty urinating and dysuria.  Musculoskeletal:  Negative for joint swelling and myalgias.  Skin:  Negative for color change and rash.  Neurological:  Negative for dizziness, light-headedness and headaches.  Psychiatric/Behavioral:  Negative for agitation and dysphoric mood.       Objective:     BP 122/70    Pulse 82    Temp  97.9 F (36.6 C)    Resp 16    Ht 5' 6"  (1.676 m)    Wt 144 lb 12.8 oz (65.7 kg)    SpO2 98%    BMI 23.37 kg/m  Wt Readings from Last 3 Encounters:  08/23/21 144 lb 12.8 oz (65.7 kg)  04/19/21 149 lb 12.8 oz (67.9 kg)  01/17/21 147 lb 12.8 oz (67 kg)    Physical Exam Vitals reviewed.  Constitutional:      General: She is not in acute distress.    Appearance: Normal appearance.  HENT:     Head: Normocephalic and atraumatic.     Right Ear: External ear normal.     Left Ear: External ear normal.  Eyes:     General: No scleral icterus.       Right eye: No discharge.        Left eye: No discharge.     Conjunctiva/sclera: Conjunctivae normal.  Neck:     Thyroid: No thyromegaly.  Cardiovascular:     Rate and Rhythm: Normal rate and regular rhythm.  Pulmonary:     Effort: No respiratory distress.     Breath sounds: Normal breath sounds. No wheezing.  Abdominal:     General: Bowel sounds are normal.     Palpations: Abdomen is soft.     Tenderness: There is no abdominal tenderness.  Musculoskeletal:        General: No swelling or tenderness.     Cervical back: Neck supple. No tenderness.  Lymphadenopathy:     Cervical: No cervical adenopathy.  Skin:    Findings: No erythema or rash.  Neurological:     Mental Status: She is alert.  Psychiatric:        Mood and Affect: Mood normal.        Behavior: Behavior normal.     Outpatient Encounter Medications as of 08/23/2021  Medication Sig   AMBULATORY NON FORMULARY MEDICATION Medication Name: Incentive Spirometry Use 10-15 times daily.   amLODipine (NORVASC) 5 MG tablet TAKE 1 TABLET BY MOUTH EVERY DAY   Calcium Carbonate-Vitamin D 600-400 MG-UNIT tablet Take 1 tablet by mouth 2 (two) times daily.   CONTOUR TEST test strip CHECK DAILY AND AS NEEDED **E11.9**   denosumab (PROLIA) 60 MG/ML SOLN injection Inject 60 mg into the skin every 6 (six) months. Administer in upper arm, thigh, or abdomen   dicyclomine (BENTYL) 10 MG capsule  Take 10 mg by mouth 3 (three) times daily before meals.  empagliflozin (JARDIANCE) 25 MG TABS tablet Take 25 mg by mouth daily.   glimepiride (AMARYL) 2 MG tablet TAKE 1 TABLET BY MOUTH  TWICE DAILY   meclizine (ANTIVERT) 12.5 MG tablet Take 1 tablet (12.5 mg total) by mouth 2 (two) times daily as needed for dizziness.   metoCLOPramide (REGLAN) 5 MG tablet Take 5 mg by mouth 2 (two) times daily.    omeprazole (PRILOSEC) 20 MG capsule Take 1 capsule (20 mg total) by mouth 2 (two) times daily.   Respiratory Therapy Supplies (FLUTTER) DEVI Use 10-15 times daily   rosuvastatin (CRESTOR) 20 MG tablet TAKE 1 TABLET BY MOUTH  DAILY   No facility-administered encounter medications on file as of 08/23/2021.     Lab Results  Component Value Date   WBC 5.1 01/04/2021   HGB 14.2 01/04/2021   HCT 42.1 01/04/2021   PLT 176.0 01/04/2021   GLUCOSE 221 (H) 08/16/2021   CHOL 167 08/16/2021   TRIG 110.0 08/16/2021   HDL 52.40 08/16/2021   LDLCALC 93 08/16/2021   ALT 10 08/16/2021   AST 13 08/16/2021   NA 135 08/16/2021   K 4.0 08/16/2021   CL 99 08/16/2021   CREATININE 0.87 08/16/2021   BUN 16 08/16/2021   CO2 24 08/16/2021   TSH 1.35 09/28/2020   HGBA1C 7.9 (H) 08/16/2021   MICROALBUR 1.7 09/28/2020    CT Abdomen Pelvis Wo Contrast  Result Date: 11/06/2020 CLINICAL DATA:  Left lower quadrant abdominal pain. EXAM: CT ABDOMEN AND PELVIS WITHOUT CONTRAST TECHNIQUE: Multidetector CT imaging of the abdomen and pelvis was performed following the standard protocol without IV contrast. COMPARISON:  03/26/2000 FINDINGS: Lower chest: The lung bases are clear. The heart size is normal. Hepatobiliary: The liver is normal. Status post cholecystectomy.There is no biliary ductal dilation. Pancreas: Normal contours without ductal dilatation. No peripancreatic fluid collection. Spleen: Unremarkable. Adrenals/Urinary Tract: --Adrenal glands: Unremarkable. --Right kidney/ureter: No hydronephrosis or radiopaque  kidney stones. --Left kidney/ureter: No hydronephrosis or radiopaque kidney stones. --Urinary bladder: Unremarkable. Stomach/Bowel: --Stomach/Duodenum: No hiatal hernia or other gastric abnormality. Normal duodenal course and caliber. --Small bowel: Unremarkable. --Colon: Rectosigmoid diverticulosis without acute inflammation. --Appendix: Not visualized. No right lower quadrant inflammation or free fluid. Vascular/Lymphatic: Atherosclerotic calcification is present within the non-aneurysmal abdominal aorta, without hemodynamically significant stenosis. --No retroperitoneal lymphadenopathy. --No mesenteric lymphadenopathy. --No pelvic or inguinal lymphadenopathy. Reproductive: Status post hysterectomy. No adnexal mass. Other: No ascites or free air. There is a fat containing left inguinal hernia. Musculoskeletal. There is a chronic compression fracture of the L1 vertebral body. There are degenerative changes of the lumbar spine without evidence for an acute fracture. IMPRESSION: 1. No acute abdominopelvic abnormality. 2. Rectosigmoid diverticulosis without acute inflammation. 3. Fat containing left inguinal hernia. Aortic Atherosclerosis (ICD10-I70.0). Electronically Signed   By: Constance Holster M.D.   On: 11/06/2020 22:01       Assessment & Plan:   Problem List Items Addressed This Visit     Aortic atherosclerosis ([place])    Continue crestor.       Atherosclerosis of native arteries of extremity with intermittent claudication [facility])    Has been evaluated by Dr Delana Meyer.  Continue crestor.  Stable.       Bronchiectasis without complication (HCC)    Breathing stable.       CAD (coronary artery disease)    No chest pain.  Continue risk factor modification.       Gastroparesis due to DM (HCC)    Seeing GI.  On reglan.  Stable.       Hypercholesterolemia    Continue crestor.  Low cholesterol diet and exercise.  Follow lipid panel and liver function tests.        Hypertension    Continue  amlodipine.  Blood pressure as outlined.  No changes.  Follow pressures.  Follow metabolic panel.       Osteoporosis    Last prolia - 04/12/21.       Peripheral neuropathy    Stable. Desires no medication.  Discussed.       Type 2 diabetes mellitus with hyperglycemia (HCC)    Low carb diet and exercise.  Continues on jardiance and amaryl.  Follow met b and a1c.  7.9 - recent a1c.  Discussed diet and exercise.          Einar Pheasant, MD

## 2021-08-23 NOTE — Assessment & Plan Note (Signed)
Continue amlodipine.  Blood pressure as outlined.  No changes.  Follow pressures.  Follow metabolic panel.  

## 2021-08-23 NOTE — Assessment & Plan Note (Signed)
Last prolia - 04/12/21.

## 2021-08-23 NOTE — Assessment & Plan Note (Signed)
Continue crestor.  Low cholesterol diet and exercise. Follow lipid panel and liver function tests.   

## 2021-09-06 NOTE — Addendum Note (Signed)
Addended by: Leeanne Rio on: 09/06/2021 04:27 PM   Modules accepted: Orders

## 2021-09-19 DIAGNOSIS — H353211 Exudative age-related macular degeneration, right eye, with active choroidal neovascularization: Secondary | ICD-10-CM | POA: Diagnosis not present

## 2021-09-19 DIAGNOSIS — H353221 Exudative age-related macular degeneration, left eye, with active choroidal neovascularization: Secondary | ICD-10-CM | POA: Diagnosis not present

## 2021-09-20 ENCOUNTER — Other Ambulatory Visit: Payer: Self-pay | Admitting: Internal Medicine

## 2021-09-20 DIAGNOSIS — E7800 Pure hypercholesterolemia, unspecified: Secondary | ICD-10-CM

## 2021-09-20 DIAGNOSIS — E1165 Type 2 diabetes mellitus with hyperglycemia: Secondary | ICD-10-CM

## 2021-09-20 DIAGNOSIS — D72829 Elevated white blood cell count, unspecified: Secondary | ICD-10-CM

## 2021-09-20 DIAGNOSIS — I10 Essential (primary) hypertension: Secondary | ICD-10-CM

## 2021-09-20 NOTE — Progress Notes (Signed)
Orders placed for labs

## 2021-09-21 ENCOUNTER — Telehealth: Payer: Self-pay | Admitting: Internal Medicine

## 2021-09-21 NOTE — Telephone Encounter (Signed)
Patient scheduled for Prolia.

## 2021-09-25 ENCOUNTER — Other Ambulatory Visit: Payer: Self-pay | Admitting: Internal Medicine

## 2021-10-11 ENCOUNTER — Other Ambulatory Visit: Payer: Self-pay

## 2021-10-11 ENCOUNTER — Ambulatory Visit (INDEPENDENT_AMBULATORY_CARE_PROVIDER_SITE_OTHER): Payer: Medicare Other

## 2021-10-11 DIAGNOSIS — M810 Age-related osteoporosis without current pathological fracture: Secondary | ICD-10-CM | POA: Diagnosis not present

## 2021-10-11 MED ORDER — DENOSUMAB 60 MG/ML ~~LOC~~ SOSY
60.0000 mg | PREFILLED_SYRINGE | Freq: Once | SUBCUTANEOUS | Status: AC
  Administered 2021-10-11: 60 mg via SUBCUTANEOUS

## 2021-10-11 NOTE — Progress Notes (Signed)
Patient came in today for Prolia injection given in left arm SQ. Patient tolerated well with no signs of distress.  °

## 2021-10-18 ENCOUNTER — Other Ambulatory Visit: Payer: Self-pay | Admitting: Internal Medicine

## 2021-10-18 ENCOUNTER — Telehealth: Payer: Self-pay | Admitting: Internal Medicine

## 2021-10-18 NOTE — Telephone Encounter (Signed)
Pt friend dropped off form to be filled out by provider. Placed in color folder ?

## 2021-10-24 NOTE — Telephone Encounter (Signed)
Signed and placed in box.   

## 2021-10-24 NOTE — Telephone Encounter (Signed)
Placed in quick sign folder for signature. Will attach med list upon completion ?

## 2021-10-25 NOTE — Telephone Encounter (Signed)
LM to see if pt wants to pick up form or have it faxed over. ?

## 2021-10-25 NOTE — Telephone Encounter (Signed)
Faxed to company and placed up front for pick up ?

## 2021-10-25 NOTE — Telephone Encounter (Signed)
Patient called and would like it fax to the company and she would like to pick up a copy next week, thank you. ?

## 2021-10-31 DIAGNOSIS — K3184 Gastroparesis: Secondary | ICD-10-CM | POA: Diagnosis not present

## 2021-10-31 DIAGNOSIS — R198 Other specified symptoms and signs involving the digestive system and abdomen: Secondary | ICD-10-CM | POA: Diagnosis not present

## 2021-10-31 DIAGNOSIS — E1143 Type 2 diabetes mellitus with diabetic autonomic (poly)neuropathy: Secondary | ICD-10-CM | POA: Diagnosis not present

## 2021-10-31 DIAGNOSIS — K219 Gastro-esophageal reflux disease without esophagitis: Secondary | ICD-10-CM | POA: Diagnosis not present

## 2021-10-31 DIAGNOSIS — E119 Type 2 diabetes mellitus without complications: Secondary | ICD-10-CM | POA: Diagnosis not present

## 2021-10-31 NOTE — Telephone Encounter (Signed)
LM for patient. Patient assistance paperwork has been sent over and confirmed that it went through. Copy is upfront for patient. Advised to call if anything further is needed or if she is going to run out of medication ?

## 2021-10-31 NOTE — Telephone Encounter (Signed)
Pt called in requesting refill on medication (empagliflozin (JARDIANCE) 25 MG TABS tablet). Pt stated that she was getting the medication by [facility] for free. Pt stated that she is running out of the medication. Pt stated she doesn't know if Dr. Nicki Reaper need to send over a script to them or sign some paper. Pt requesting callback  ?

## 2021-10-31 NOTE — Telephone Encounter (Signed)
Can we give her a sample of jardiance this week to last until her patient assistance arrives? ?

## 2021-10-31 NOTE — Telephone Encounter (Signed)
Patient returned office phone call and note was read. Patient only has 4 days remaining of empagliflozin (JARDIANCE) 25 MG TABS tablet remaining. She will run out of medication and not sure when she will receive medication. ?

## 2021-11-01 NOTE — Telephone Encounter (Signed)
LMTCB. Also received notification that pt assistance meds were approved for another year. We can give pt samples ?

## 2021-11-01 NOTE — Telephone Encounter (Signed)
Pt returning call. Advise Pt of below note. Pt requesting callback ?

## 2021-11-01 NOTE — Telephone Encounter (Signed)
Ok

## 2021-11-02 NOTE — Telephone Encounter (Signed)
Samples placed up front ?

## 2021-11-02 NOTE — Telephone Encounter (Signed)
Correction: Patient was NOT approved for patient assistance this year due to changes to their guide lines. In order for patient to be approved, she will have to call SS and register for "extra help" before being able to be approved for patient assistance. Will call patient to discuss. ?

## 2021-11-06 NOTE — Telephone Encounter (Signed)
Discussed with patient. She has been denied through [facility] for her patient assistance medication and will need to call social security to apply for low income subsidy. Patient stated that she could get her meds through [facility] for $120 for 90 day supply. Patient is going to try to get approved for extra help with social security first. She is going to call them and then let me know how to proceed with sending in her medication. She has 1 month of medication.  ?

## 2021-11-08 ENCOUNTER — Other Ambulatory Visit: Payer: Self-pay

## 2021-11-08 MED ORDER — EMPAGLIFLOZIN 25 MG PO TABS: 25.0000 mg | ORAL_TABLET | Freq: Every day | ORAL | 3 refills | Status: DC

## 2021-11-08 NOTE — Telephone Encounter (Signed)
She decided to just go with her mail order pharmacy. ?

## 2021-11-08 NOTE — Telephone Encounter (Signed)
Medication sent to mail order per patient request. ?

## 2021-11-28 DIAGNOSIS — H353221 Exudative age-related macular degeneration, left eye, with active choroidal neovascularization: Secondary | ICD-10-CM | POA: Diagnosis not present

## 2021-11-28 DIAGNOSIS — H353211 Exudative age-related macular degeneration, right eye, with active choroidal neovascularization: Secondary | ICD-10-CM | POA: Diagnosis not present

## 2021-12-20 ENCOUNTER — Other Ambulatory Visit (INDEPENDENT_AMBULATORY_CARE_PROVIDER_SITE_OTHER): Payer: Medicare Other

## 2021-12-20 DIAGNOSIS — E7800 Pure hypercholesterolemia, unspecified: Secondary | ICD-10-CM

## 2021-12-20 DIAGNOSIS — I10 Essential (primary) hypertension: Secondary | ICD-10-CM | POA: Diagnosis not present

## 2021-12-20 DIAGNOSIS — D72829 Elevated white blood cell count, unspecified: Secondary | ICD-10-CM | POA: Diagnosis not present

## 2021-12-20 DIAGNOSIS — E1165 Type 2 diabetes mellitus with hyperglycemia: Secondary | ICD-10-CM

## 2021-12-20 LAB — BASIC METABOLIC PANEL
BUN: 24 mg/dL — ABNORMAL HIGH (ref 6–23)
CO2: 26 mEq/L (ref 19–32)
Calcium: 8.9 mg/dL (ref 8.4–10.5)
Chloride: 101 mEq/L (ref 96–112)
Creatinine, Ser: 0.89 mg/dL (ref 0.40–1.20)
GFR: 57.19 mL/min — ABNORMAL LOW (ref >60.00)
Glucose, Bld: 208 mg/dL — ABNORMAL HIGH (ref 70–99)
Potassium: 4.4 mEq/L (ref 3.5–5.1)
Sodium: 137 mEq/L (ref 135–145)

## 2021-12-20 LAB — CBC WITH DIFFERENTIAL/PLATELET
Basophils Absolute: 0 10*3/uL (ref 0.0–0.1)
Basophils Relative: 0.6 % (ref 0.0–3.0)
Eosinophils Absolute: 0 10*3/uL (ref 0.0–0.7)
Eosinophils Relative: 0 % (ref 0.0–5.0)
HCT: 43.5 % (ref 36.0–46.0)
Hemoglobin: 14.3 g/dL (ref 12.0–15.0)
Lymphocytes Relative: 44.6 % (ref 12.0–46.0)
Lymphs Abs: 2.4 10*3/uL (ref 0.7–4.0)
MCHC: 32.9 g/dL (ref 30.0–36.0)
MCV: 89 fl (ref 78.0–100.0)
Monocytes Absolute: 0.4 10*3/uL (ref 0.1–1.0)
Monocytes Relative: 7.5 % (ref 3.0–12.0)
Neutro Abs: 2.5 10*3/uL (ref 1.4–7.7)
Neutrophils Relative %: 47.3 % (ref 43.0–77.0)
Platelets: 174 10*3/uL (ref 150.0–400.0)
RBC: 4.88 Mil/uL (ref 3.87–5.11)
RDW: 14.3 % (ref 11.5–15.5)
WBC: 5.3 10*3/uL (ref 4.0–10.5)

## 2021-12-20 LAB — HEPATIC FUNCTION PANEL
ALT: 12 U/L (ref 0–35)
AST: 13 U/L (ref 0–37)
Albumin: 4.3 g/dL (ref 3.5–5.2)
Alkaline Phosphatase: 44 U/L (ref 39–117)
Bilirubin, Direct: 0.2 mg/dL (ref 0.0–0.3)
Total Bilirubin: 0.7 mg/dL (ref 0.2–1.2)
Total Protein: 7.1 g/dL (ref 6.0–8.3)

## 2021-12-20 LAB — LIPID PANEL
Cholesterol: 165 mg/dL (ref 0–200)
HDL: 60.4 mg/dL (ref >39.00)
LDL Cholesterol: 87 mg/dL (ref 0–99)
NonHDL: 104.73
Total CHOL/HDL Ratio: 3
Triglycerides: 87 mg/dL (ref 0.0–149.0)
VLDL: 17.4 mg/dL (ref 0.0–40.0)

## 2021-12-20 LAB — HEMOGLOBIN A1C: Hgb A1c MFr Bld: 7.9 % — ABNORMAL HIGH (ref 4.6–6.5)

## 2021-12-20 LAB — MICROALBUMIN / CREATININE URINE RATIO
Creatinine,U: 86.8 mg/dL
Microalb Creat Ratio: 7.2 mg/g (ref 0.0–30.0)
Microalb, Ur: 6.2 mg/dL — ABNORMAL HIGH (ref 0.0–1.9)

## 2021-12-20 LAB — TSH: TSH: 1.63 u[IU]/mL (ref 0.35–5.50)

## 2021-12-27 ENCOUNTER — Encounter: Payer: Self-pay | Admitting: Internal Medicine

## 2021-12-27 ENCOUNTER — Ambulatory Visit (INDEPENDENT_AMBULATORY_CARE_PROVIDER_SITE_OTHER): Payer: Medicare Other | Admitting: Internal Medicine

## 2021-12-27 DIAGNOSIS — R351 Nocturia: Secondary | ICD-10-CM | POA: Diagnosis not present

## 2021-12-27 DIAGNOSIS — E1165 Type 2 diabetes mellitus with hyperglycemia: Secondary | ICD-10-CM

## 2021-12-27 DIAGNOSIS — M810 Age-related osteoporosis without current pathological fracture: Secondary | ICD-10-CM

## 2021-12-27 DIAGNOSIS — J479 Bronchiectasis, uncomplicated: Secondary | ICD-10-CM

## 2021-12-27 DIAGNOSIS — K3184 Gastroparesis: Secondary | ICD-10-CM

## 2021-12-27 DIAGNOSIS — I70213 Atherosclerosis of native arteries of extremities with intermittent claudication, bilateral legs: Secondary | ICD-10-CM | POA: Diagnosis not present

## 2021-12-27 DIAGNOSIS — I10 Essential (primary) hypertension: Secondary | ICD-10-CM | POA: Diagnosis not present

## 2021-12-27 DIAGNOSIS — E7800 Pure hypercholesterolemia, unspecified: Secondary | ICD-10-CM

## 2021-12-27 DIAGNOSIS — E1143 Type 2 diabetes mellitus with diabetic autonomic (poly)neuropathy: Secondary | ICD-10-CM

## 2021-12-27 DIAGNOSIS — I700 Atherosclerosis of aorta: Secondary | ICD-10-CM | POA: Diagnosis not present

## 2021-12-27 DIAGNOSIS — I2510 Atherosclerotic heart disease of native coronary artery without angina pectoris: Secondary | ICD-10-CM | POA: Diagnosis not present

## 2021-12-27 NOTE — Progress Notes (Signed)
Patient ID: Debra Kirk, female   DOB: 06-01-1930, 88 y.o.   MRN: [id number]   Subjective:    Patient ID: Debra Kirk, female    DOB: 1930/05/08, 88 y.o.   MRN: [id number]  This visit occurred during the SARS-CoV-2 public health emergency.  Safety protocols were in place, including screening questions prior to the visit, additional usage of staff PPE, and extensive cleaning of exam room while observing appropriate contact time as indicated for disinfecting solutions.   Patient here for a scheduled follow up.   Chief Complaint  Patient presents with   Follow-up    74mof/u   Hypertension   Diabetes   .   HPI She is accompanied by her daughter (Jerolyn Center.  History obtained from both of them.  She is doing relatively well.  Tries to stay active.  No chest pain.  Breathing stable.  No acid reflux reported.  Eating.  Good appetite.  Just saw GI 10/31/21.  Recommended reducing reglan to 532mbid and continuing prilosec 2061mid.  No abdominal pain.  Does reports some nocturia.  Discussed.  States has to strain sometimes to urinate.  Some leakage.  Discussed urinary incontinence, stress incontinence and overactive bladder.  Discussed kegel exercises, medication, further testing and pelvic floor PT.  No dysuria.  Discussed labs.  Blood sugar 7.9 - stable.  145 this am.    Past Medical History:  Diagnosis Date   Carotid artery disease (HCC)    Compression fracture    s/p fosamax, prolia   Diabetes mellitus (HCC)    Femoral neuropathy    left, elevated CRP, ESR, negative FANA and ANCA   Fibrocystic breast disease    Hypercholesterolemia    Hypertension    Past Surgical History:  Procedure Laterality Date   APPENDECTOMY     CHOLECYSTECTOMY     HERNIA REPAIR     INGUINAL HERNIA REPAIR     left x 1, right x 2   OVARIAN CYST REMOVAL     PARTIAL HYSTERECTOMY     Family History  Problem Relation Age of Onset   Heart disease Father        myocardial infarction   Heart disease Mother         myocardial infarction   Diabetes Mother    Congestive Heart Failure Mother    Bone cancer Sister    Diabetes Sister        x2   Ovarian cancer Sister 70 6Heart disease Sister        CABG   Rheum arthritis Sister    Breast cancer Neg Hx    Colon cancer Neg Hx    Social History   Socioeconomic History   Marital status: Widowed    Spouse name: Not on file   Number of children: 3   Years of education: Not on file   Highest education level: Not on file  Occupational History   Not on file  Tobacco Use   Smoking status: Former    Types: Cigarettes    Quit date: 08/20/1958    Years since quitting: 63.4   Smokeless tobacco: Never  Substance and Sexual Activity   Alcohol use: No    Alcohol/week: 0.0 standard drinks   Drug use: No   Sexual activity: Never  Other Topics Concern   Not on file  Social History Narrative   Not on file   Social Determinants of Health   Financial Resource Strain: Not on file  Food Insecurity: Not on file  Transportation Needs: Not on file  Physical Activity: Not on file  Stress: Not on file  Social Connections: Not on file     Review of Systems  Constitutional:  Negative for appetite change and unexpected weight change.  HENT:  Negative for congestion and sinus pressure.   Respiratory:  Negative for cough, chest tightness and shortness of breath.   Cardiovascular:  Negative for chest pain, palpitations and leg swelling.  Gastrointestinal:  Negative for abdominal pain, diarrhea, nausea and vomiting.  Genitourinary:  Negative for dysuria.       Nocturia and leakage as outlined.  Straining.    Musculoskeletal:  Negative for joint swelling and myalgias.  Skin:  Negative for color change and rash.  Neurological:  Negative for dizziness, light-headedness and headaches.  Psychiatric/Behavioral:  Negative for agitation and dysphoric mood.       Objective:     BP 138/70 (BP Location: Left Arm, Patient Position: Sitting, Cuff Size:  Small)   Pulse 72   Temp 98 F (36.7 C) (Temporal)   Resp 15   Ht 5' 6" (1.676 m)   Wt 140 lb 3.2 oz (63.6 kg)   SpO2 98%   BMI 22.63 kg/m  Wt Readings from Last 3 Encounters:  12/27/21 140 lb 3.2 oz (63.6 kg)  08/23/21 144 lb 12.8 oz (65.7 kg)  04/19/21 149 lb 12.8 oz (67.9 kg)    Physical Exam Vitals reviewed.  Constitutional:      General: She is not in acute distress.    Appearance: Normal appearance.  HENT:     Head: Normocephalic and atraumatic.     Right Ear: External ear normal.     Left Ear: External ear normal.  Eyes:     General: No scleral icterus.       Right eye: No discharge.        Left eye: No discharge.     Conjunctiva/sclera: Conjunctivae normal.  Neck:     Thyroid: No thyromegaly.  Cardiovascular:     Rate and Rhythm: Normal rate and regular rhythm.  Pulmonary:     Effort: No respiratory distress.     Breath sounds: Normal breath sounds. No wheezing.  Abdominal:     General: Bowel sounds are normal.     Palpations: Abdomen is soft.     Tenderness: There is no abdominal tenderness.  Musculoskeletal:        General: No swelling or tenderness.     Cervical back: Neck supple. No tenderness.  Lymphadenopathy:     Cervical: No cervical adenopathy.  Skin:    Findings: No erythema or rash.  Neurological:     Mental Status: She is alert.  Psychiatric:        Mood and Affect: Mood normal.        Behavior: Behavior normal.     Outpatient Encounter Medications as of 12/27/2021  Medication Sig   AMBULATORY NON FORMULARY MEDICATION Medication Name: Incentive Spirometry Use 10-15 times daily.   amLODipine (NORVASC) 5 MG tablet TAKE 1 TABLET BY MOUTH EVERY DAY   Calcium Carbonate-Vitamin D 600-400 MG-UNIT tablet Take 1 tablet by mouth 2 (two) times daily.   CONTOUR TEST test strip CHECK DAILY AND AS NEEDED **E11.9**   denosumab (PROLIA) 60 MG/ML SOLN injection Inject 60 mg into the skin every 6 (six) months. Administer in upper arm, thigh, or abdomen    dicyclomine (BENTYL) 10 MG capsule Take 10 mg by mouth 3 (three) times  daily before meals.    empagliflozin (JARDIANCE) 25 MG TABS tablet Take 1 tablet (25 mg total) by mouth daily.   glimepiride (AMARYL) 2 MG tablet TAKE 1 TABLET BY MOUTH  TWICE DAILY   meclizine (ANTIVERT) 12.5 MG tablet Take 1 tablet (12.5 mg total) by mouth 2 (two) times daily as needed for dizziness.   metoCLOPramide (REGLAN) 5 MG tablet Take 5 mg by mouth 2 (two) times daily.    omeprazole (PRILOSEC) 20 MG capsule Take 1 capsule (20 mg total) by mouth 2 (two) times daily.   Respiratory Therapy Supplies (FLUTTER) DEVI Use 10-15 times daily   rosuvastatin (CRESTOR) 20 MG tablet TAKE 1 TABLET BY MOUTH  DAILY   No facility-administered encounter medications on file as of 12/27/2021.     Lab Results  Component Value Date   WBC 5.3 12/20/2021   HGB 14.3 12/20/2021   HCT 43.5 12/20/2021   PLT 174.0 12/20/2021   GLUCOSE 208 (H) 12/20/2021   CHOL 165 12/20/2021   TRIG 87.0 12/20/2021   HDL 60.40 12/20/2021   LDLCALC 87 12/20/2021   ALT 12 12/20/2021   AST 13 12/20/2021   NA 137 12/20/2021   K 4.4 12/20/2021   CL 101 12/20/2021   CREATININE 0.89 12/20/2021   BUN 24 (H) 12/20/2021   CO2 26 12/20/2021   TSH 1.63 12/20/2021   HGBA1C 7.9 (H) 12/20/2021   MICROALBUR 6.2 (H) 12/20/2021    CT Abdomen Pelvis Wo Contrast  Result Date: 11/06/2020 CLINICAL DATA:  Left lower quadrant abdominal pain. EXAM: CT ABDOMEN AND PELVIS WITHOUT CONTRAST TECHNIQUE: Multidetector CT imaging of the abdomen and pelvis was performed following the standard protocol without IV contrast. COMPARISON:  03/26/2000 FINDINGS: Lower chest: The lung bases are clear. The heart size is normal. Hepatobiliary: The liver is normal. Status post cholecystectomy.There is no biliary ductal dilation. Pancreas: Normal contours without ductal dilatation. No peripancreatic fluid collection. Spleen: Unremarkable. Adrenals/Urinary Tract: --Adrenal glands:  Unremarkable. --Right kidney/ureter: No hydronephrosis or radiopaque kidney stones. --Left kidney/ureter: No hydronephrosis or radiopaque kidney stones. --Urinary bladder: Unremarkable. Stomach/Bowel: --Stomach/Duodenum: No hiatal hernia or other gastric abnormality. Normal duodenal course and caliber. --Small bowel: Unremarkable. --Colon: Rectosigmoid diverticulosis without acute inflammation. --Appendix: Not visualized. No right lower quadrant inflammation or free fluid. Vascular/Lymphatic: Atherosclerotic calcification is present within the non-aneurysmal abdominal aorta, without hemodynamically significant stenosis. --No retroperitoneal lymphadenopathy. --No mesenteric lymphadenopathy. --No pelvic or inguinal lymphadenopathy. Reproductive: Status post hysterectomy. No adnexal mass. Other: No ascites or free air. There is a fat containing left inguinal hernia. Musculoskeletal. There is a chronic compression fracture of the L1 vertebral body. There are degenerative changes of the lumbar spine without evidence for an acute fracture. IMPRESSION: 1. No acute abdominopelvic abnormality. 2. Rectosigmoid diverticulosis without acute inflammation. 3. Fat containing left inguinal hernia. Aortic Atherosclerosis (ICD10-I70.0). Electronically Signed   By: Constance Holster M.D.   On: 11/06/2020 22:01       Assessment & Plan:   Problem List Items Addressed This Visit     Aortic atherosclerosis ([place])    Continue crestor.        Atherosclerosis of native arteries of extremity with intermittent claudication [facility])    Has been evaluated by Dr Delana Meyer.  Continue crestor.  Stable.        Bronchiectasis without complication (HCC)    Breathing stable.        CAD (coronary artery disease)    No chest pain.  Continue risk factor modification.  Gastroparesis due to DM (HCC)    Seeing GI. On reglan.  Dose being adjusted as outlined.  Follow.        Hypercholesterolemia    Continue crestor.  Low  cholesterol diet and exercise.  Follow lipid panel and liver function tests.         Relevant Orders   Hepatic function panel   Lipid panel   Hypertension    Continue amlodipine.  Blood pressure as outlined.  No changes.  Follow pressures.  Follow metabolic panel.        Nocturia    Sugars as outlined.  Discussed sleep apnea.  Discussed monitoring fluid intake just prior to bed.  Also with some leakage and straining to urinate.  Discussed urinary incontinence, stress incontinence and overactive bladder.  Discussed kegel exercises, medication, further testing and pelvic floor PT. She will do kegel exercises, but desires no further intervention.  Follow.        Osteoporosis    prolia 10/11/21       Type 2 diabetes mellitus with hyperglycemia (HCC)    Low carb diet and exercise.  Continues on jardiance and amaryl.  Follow met b and a1c.  7.9 - recent a1c.  Discussed diet and exercise.         Relevant Orders   Hemoglobin T7S   Basic metabolic panel     Einar Pheasant, MD

## 2022-01-07 ENCOUNTER — Encounter: Payer: Self-pay | Admitting: Internal Medicine

## 2022-01-07 DIAGNOSIS — R351 Nocturia: Secondary | ICD-10-CM | POA: Insufficient documentation

## 2022-01-07 NOTE — Assessment & Plan Note (Signed)
No chest pain.  Continue risk factor modification.  

## 2022-01-07 NOTE — Assessment & Plan Note (Signed)
Continue amlodipine.  Blood pressure as outlined.  No changes.  Follow pressures.  Follow metabolic panel.  

## 2022-01-07 NOTE — Assessment & Plan Note (Signed)
Continue crestor 

## 2022-01-07 NOTE — Assessment & Plan Note (Signed)
prolia 10/11/21

## 2022-01-07 NOTE — Assessment & Plan Note (Signed)
Low carb diet and exercise.  Continues on jardiance and amaryl.  Follow met b and a1c.  7.9 - recent a1c.  Discussed diet and exercise.

## 2022-01-07 NOTE — Assessment & Plan Note (Signed)
Continue crestor.  Low cholesterol diet and exercise. Follow lipid panel and liver function tests.   

## 2022-01-07 NOTE — Assessment & Plan Note (Signed)
Has been evaluated by Dr Schnier.  Continue crestor.  Stable.  

## 2022-01-07 NOTE — Assessment & Plan Note (Signed)
Seeing GI. On reglan.  Dose being adjusted as outlined.  Follow.

## 2022-01-07 NOTE — Assessment & Plan Note (Signed)
Sugars as outlined.  Discussed sleep apnea.  Discussed monitoring fluid intake just prior to bed.  Also with some leakage and straining to urinate.  Discussed urinary incontinence, stress incontinence and overactive bladder.  Discussed kegel exercises, medication, further testing and pelvic floor PT. She will do kegel exercises, but desires no further intervention.  Follow.

## 2022-01-07 NOTE — Assessment & Plan Note (Signed)
Breathing stable.

## 2022-02-14 DIAGNOSIS — R55 Syncope and collapse: Secondary | ICD-10-CM | POA: Diagnosis not present

## 2022-02-14 DIAGNOSIS — R0602 Shortness of breath: Secondary | ICD-10-CM | POA: Diagnosis not present

## 2022-02-14 DIAGNOSIS — I10 Essential (primary) hypertension: Secondary | ICD-10-CM | POA: Diagnosis not present

## 2022-02-14 DIAGNOSIS — E7800 Pure hypercholesterolemia, unspecified: Secondary | ICD-10-CM | POA: Diagnosis not present

## 2022-02-14 DIAGNOSIS — E119 Type 2 diabetes mellitus without complications: Secondary | ICD-10-CM | POA: Diagnosis not present

## 2022-02-14 DIAGNOSIS — I25119 Atherosclerotic heart disease of native coronary artery with unspecified angina pectoris: Secondary | ICD-10-CM | POA: Diagnosis not present

## 2022-02-27 DIAGNOSIS — H353211 Exudative age-related macular degeneration, right eye, with active choroidal neovascularization: Secondary | ICD-10-CM | POA: Diagnosis not present

## 2022-02-27 DIAGNOSIS — H353221 Exudative age-related macular degeneration, left eye, with active choroidal neovascularization: Secondary | ICD-10-CM | POA: Diagnosis not present

## 2022-02-27 LAB — HM DIABETES EYE EXAM

## 2022-04-01 ENCOUNTER — Other Ambulatory Visit: Payer: Self-pay | Admitting: Internal Medicine

## 2022-04-02 ENCOUNTER — Other Ambulatory Visit: Payer: Self-pay

## 2022-04-02 ENCOUNTER — Telehealth: Payer: Self-pay | Admitting: Internal Medicine

## 2022-04-02 MED ORDER — AMLODIPINE BESYLATE 5 MG PO TABS: 5.0000 mg | ORAL_TABLET | Freq: Every day | ORAL | 3 refills | Status: DC

## 2022-04-02 NOTE — Telephone Encounter (Signed)
Please see phone message and correct

## 2022-04-02 NOTE — Telephone Encounter (Signed)
Patient called b/c medicine was sent to the wrong location.  It should have been sent to [facility] - amLODipine (NORVASC) 5 MG tablet.   She will go ahead and get it from [facility] this time, just wanted to make sure the provider was aware.

## 2022-04-02 NOTE — Telephone Encounter (Signed)
Sent to [facility]

## 2022-04-25 ENCOUNTER — Ambulatory Visit (INDEPENDENT_AMBULATORY_CARE_PROVIDER_SITE_OTHER): Payer: Medicare Other | Admitting: *Deleted

## 2022-04-25 ENCOUNTER — Other Ambulatory Visit (INDEPENDENT_AMBULATORY_CARE_PROVIDER_SITE_OTHER): Payer: Medicare Other

## 2022-04-25 DIAGNOSIS — E7800 Pure hypercholesterolemia, unspecified: Secondary | ICD-10-CM | POA: Diagnosis not present

## 2022-04-25 DIAGNOSIS — E1165 Type 2 diabetes mellitus with hyperglycemia: Secondary | ICD-10-CM

## 2022-04-25 DIAGNOSIS — M810 Age-related osteoporosis without current pathological fracture: Secondary | ICD-10-CM | POA: Diagnosis not present

## 2022-04-25 LAB — HEPATIC FUNCTION PANEL
ALT: 10 U/L (ref 0–35)
AST: 13 U/L (ref 0–37)
Albumin: 4 g/dL (ref 3.5–5.2)
Alkaline Phosphatase: 43 U/L (ref 39–117)
Bilirubin, Direct: 0.2 mg/dL (ref 0.0–0.3)
Total Bilirubin: 0.8 mg/dL (ref 0.2–1.2)
Total Protein: 6.8 g/dL (ref 6.0–8.3)

## 2022-04-25 LAB — BASIC METABOLIC PANEL
BUN: 14 mg/dL (ref 6–23)
CO2: 26 mEq/L (ref 19–32)
Calcium: 9.5 mg/dL (ref 8.4–10.5)
Chloride: 100 mEq/L (ref 96–112)
Creatinine, Ser: 0.87 mg/dL (ref 0.40–1.20)
GFR: 58.63 mL/min — ABNORMAL LOW (ref >60.00)
Glucose, Bld: 212 mg/dL — ABNORMAL HIGH (ref 70–99)
Potassium: 3.5 mEq/L (ref 3.5–5.1)
Sodium: 139 mEq/L (ref 135–145)

## 2022-04-25 LAB — LIPID PANEL
Cholesterol: 146 mg/dL (ref 0–200)
HDL: 55.1 mg/dL (ref >39.00)
LDL Cholesterol: 74 mg/dL (ref 0–99)
NonHDL: 91.16
Total CHOL/HDL Ratio: 3
Triglycerides: 88 mg/dL (ref 0.0–149.0)
VLDL: 17.6 mg/dL (ref 0.0–40.0)

## 2022-04-25 LAB — HEMOGLOBIN A1C: Hgb A1c MFr Bld: 8.1 % — ABNORMAL HIGH (ref 4.6–6.5)

## 2022-04-25 MED ORDER — DENOSUMAB 60 MG/ML ~~LOC~~ SOSY
60.0000 mg | PREFILLED_SYRINGE | Freq: Once | SUBCUTANEOUS | Status: AC
  Administered 2022-04-25: 60 mg via SUBCUTANEOUS

## 2022-04-25 NOTE — Progress Notes (Signed)
Pt received Prolia injection in left arm ([place]). PT tolerated it well with no concerns or complaints.

## 2022-04-30 ENCOUNTER — Telehealth: Payer: Self-pay | Admitting: Internal Medicine

## 2022-04-30 NOTE — Telephone Encounter (Signed)
Patient would like her amLODipine (NORVASC) 5 MG tablet prescription sent to [facility] order.

## 2022-05-02 ENCOUNTER — Ambulatory Visit (INDEPENDENT_AMBULATORY_CARE_PROVIDER_SITE_OTHER): Payer: Medicare Other | Admitting: Internal Medicine

## 2022-05-02 ENCOUNTER — Encounter: Payer: Self-pay | Admitting: Internal Medicine

## 2022-05-02 VITALS — BP 130/60 | HR 73 | Temp 97.8°F | Ht 66.0 in | Wt 146.2 lb

## 2022-05-02 DIAGNOSIS — I2510 Atherosclerotic heart disease of native coronary artery without angina pectoris: Secondary | ICD-10-CM

## 2022-05-02 DIAGNOSIS — K3184 Gastroparesis: Secondary | ICD-10-CM

## 2022-05-02 DIAGNOSIS — J479 Bronchiectasis, uncomplicated: Secondary | ICD-10-CM | POA: Diagnosis not present

## 2022-05-02 DIAGNOSIS — Z23 Encounter for immunization: Secondary | ICD-10-CM | POA: Diagnosis not present

## 2022-05-02 DIAGNOSIS — E1165 Type 2 diabetes mellitus with hyperglycemia: Secondary | ICD-10-CM | POA: Diagnosis not present

## 2022-05-02 DIAGNOSIS — E7800 Pure hypercholesterolemia, unspecified: Secondary | ICD-10-CM | POA: Diagnosis not present

## 2022-05-02 DIAGNOSIS — I700 Atherosclerosis of aorta: Secondary | ICD-10-CM | POA: Diagnosis not present

## 2022-05-02 DIAGNOSIS — W19XXXA Unspecified fall, initial encounter: Secondary | ICD-10-CM

## 2022-05-02 DIAGNOSIS — E1143 Type 2 diabetes mellitus with diabetic autonomic (poly)neuropathy: Secondary | ICD-10-CM

## 2022-05-02 DIAGNOSIS — I70213 Atherosclerosis of native arteries of extremities with intermittent claudication, bilateral legs: Secondary | ICD-10-CM

## 2022-05-02 DIAGNOSIS — G6289 Other specified polyneuropathies: Secondary | ICD-10-CM | POA: Diagnosis not present

## 2022-05-02 DIAGNOSIS — I10 Essential (primary) hypertension: Secondary | ICD-10-CM | POA: Diagnosis not present

## 2022-05-02 LAB — HM DIABETES FOOT EXAM

## 2022-05-02 NOTE — Progress Notes (Signed)
Patient ID: Debra Kirk, female   DOB: 09/21/1929, 88 y.o.   MRN: [id number]   Subjective:    Patient ID: Debra Kirk, female    DOB: 1929/10/20, 88 y.o.   MRN: [id number]   Patient here for  Chief Complaint  Patient presents with   Follow-up    4 month follow up    .   HPI Here to follow up regarding diabetes, hypertension and hypercholesterolemia.  She is accompanied by her daughter.  History obtained from both of them. Saw cardiology 02/14/22.  Stable. No chest pain.  Breathing stable.  No increased cough or congestion.  No abdominal pain.  Bowels stable.  Recent "fall" - was leaning over.  Not exactly sure what happened - but fell backwards.  Did hit her head.  Was not evaluated.  Denies any significant injury.  No headache or dizziness.     Past Medical History:  Diagnosis Date   Carotid artery disease ([place])    Compression fracture    s/p fosamax, prolia   Diabetes mellitus (HCC)    Femoral neuropathy    left, elevated CRP, ESR, negative FANA and ANCA   Fibrocystic breast disease    Hypercholesterolemia    Hypertension    Past Surgical History:  Procedure Laterality Date   APPENDECTOMY     CHOLECYSTECTOMY     HERNIA REPAIR     INGUINAL HERNIA REPAIR     left x 1, right x 2   OVARIAN CYST REMOVAL     PARTIAL HYSTERECTOMY     Family History  Problem Relation Age of Onset   Heart disease Father        myocardial infarction   Heart disease Mother        myocardial infarction   Diabetes Mother    Congestive Heart Failure Mother    Bone cancer Sister    Diabetes Sister        x2   Ovarian cancer Sister 67   Heart disease Sister        CABG   Rheum arthritis Sister    Breast cancer Neg Hx    Colon cancer Neg Hx    Social History   Socioeconomic History   Marital status: Widowed    Spouse name: Not on file   Number of children: 3   Years of education: Not on file   Highest education level: Not on file  Occupational History   Not on file   Tobacco Use   Smoking status: Former    Types: Cigarettes    Quit date: 08/20/1958    Years since quitting: 63.7   Smokeless tobacco: Never  Substance and Sexual Activity   Alcohol use: No    Alcohol/week: 0.0 standard drinks of alcohol   Drug use: No   Sexual activity: Never  Other Topics Concern   Not on file  Social History Narrative   Not on file   Social Determinants of Health   Financial Resource Strain: Not on file  Food Insecurity: Not on file  Transportation Needs: Not on file  Physical Activity: Not on file  Stress: Not on file  Social Connections: Not on file     Review of Systems  Constitutional:  Negative for appetite change and unexpected weight change.  HENT:  Negative for congestion and sinus pressure.   Respiratory:  Negative for cough, chest tightness and shortness of breath.   Cardiovascular:  Negative for chest pain, palpitations and leg swelling.  Gastrointestinal:  Negative for abdominal pain, diarrhea, nausea and vomiting.  Genitourinary:  Negative for difficulty urinating and dysuria.  Musculoskeletal:  Negative for joint swelling and myalgias.  Skin:  Negative for color change and rash.  Neurological:  Negative for dizziness, light-headedness and headaches.  Psychiatric/Behavioral:  Negative for agitation and dysphoric mood.        Objective:     BP 130/60 (BP Location: Left Arm, Patient Position: Sitting, Cuff Size: Normal)   Pulse 73   Temp 97.8 F (36.6 C) (Oral)   Ht _0  (1.676 m)   Wt 146 lb 3.2 oz (66.3 kg)   SpO2 95%   BMI 23.60 kg/m  Wt Readings from Last 3 Encounters:  05/02/22 146 lb 3.2 oz (66.3 kg)  12/27/21 140 lb 3.2 oz (63.6 kg)  08/23/21 144 lb 12.8 oz (65.7 kg)    Physical Exam Vitals reviewed.  Constitutional:      General: She is not in acute distress.    Appearance: Normal appearance.  HENT:     Head: Normocephalic and atraumatic.     Right Ear: External ear normal.     Left Ear: External ear normal.   Eyes:     General: No scleral icterus.       Right eye: No discharge.        Left eye: No discharge.     Conjunctiva/sclera: Conjunctivae normal.  Neck:     Thyroid: No thyromegaly.  Cardiovascular:     Rate and Rhythm: Normal rate and regular rhythm.  Pulmonary:     Effort: No respiratory distress.     Breath sounds: Normal breath sounds. No wheezing.  Abdominal:     General: Bowel sounds are normal.     Palpations: Abdomen is soft.     Tenderness: There is no abdominal tenderness.  Musculoskeletal:        General: No swelling or tenderness.     Cervical back: Neck supple. No tenderness.  Lymphadenopathy:     Cervical: No cervical adenopathy.  Skin:    Findings: No erythema or rash.  Neurological:     Mental Status: She is alert.  Psychiatric:        Mood and Affect: Mood normal.        Behavior: Behavior normal.      Outpatient Encounter Medications as of 05/02/2022  Medication Sig   AMBULATORY NON FORMULARY MEDICATION Medication Name: Incentive Spirometry Use 10-15 times daily.   amLODipine (NORVASC) 5 MG tablet Take 1 tablet (5 mg total) by mouth daily.   Calcium Carbonate-Vitamin D 600-400 MG-UNIT tablet Take 1 tablet by mouth 2 (two) times daily.   CONTOUR TEST test strip CHECK DAILY AND AS NEEDED **E11.9**   denosumab (PROLIA) 60 MG/ML SOLN injection Inject 60 mg into the skin every 6 (six) months. Administer in upper arm, thigh, or abdomen   dicyclomine (BENTYL) 10 MG capsule Take 10 mg by mouth 3 (three) times daily before meals.    empagliflozin (JARDIANCE) 25 MG TABS tablet Take 1 tablet (25 mg total) by mouth daily.   glimepiride (AMARYL) 2 MG tablet TAKE 1 TABLET BY MOUTH  TWICE DAILY   meclizine (ANTIVERT) 12.5 MG tablet Take 1 tablet (12.5 mg total) by mouth 2 (two) times daily as needed for dizziness.   metoCLOPramide (REGLAN) 5 MG tablet Take 5 mg by mouth 2 (two) times daily.    omeprazole (PRILOSEC) 20 MG capsule Take 1 capsule (20 mg total) by mouth 2  (two) times daily.  Respiratory Therapy Supplies (FLUTTER) DEVI Use 10-15 times daily   rosuvastatin (CRESTOR) 20 MG tablet TAKE 1 TABLET BY MOUTH  DAILY   No facility-administered encounter medications on file as of 05/02/2022.     Lab Results  Component Value Date   WBC 5.3 12/20/2021   HGB 14.3 12/20/2021   HCT 43.5 12/20/2021   PLT 174.0 12/20/2021   GLUCOSE 212 (H) 04/25/2022   CHOL 146 04/25/2022   TRIG 88.0 04/25/2022   HDL 55.10 04/25/2022   LDLCALC 74 04/25/2022   ALT 10 04/25/2022   AST 13 04/25/2022   NA 139 04/25/2022   K 3.5 04/25/2022   CL 100 04/25/2022   CREATININE 0.87 04/25/2022   BUN 14 04/25/2022   CO2 26 04/25/2022   TSH 1.63 12/20/2021   HGBA1C 8.1 (H) 04/25/2022   MICROALBUR 6.2 (H) 12/20/2021    CT Abdomen Pelvis Wo Contrast  Result Date: 11/06/2020 CLINICAL DATA:  Left lower quadrant abdominal pain. EXAM: CT ABDOMEN AND PELVIS WITHOUT CONTRAST TECHNIQUE: Multidetector CT imaging of the abdomen and pelvis was performed following the standard protocol without IV contrast. COMPARISON:  03/26/2000 FINDINGS: Lower chest: The lung bases are clear. The heart size is normal. Hepatobiliary: The liver is normal. Status post cholecystectomy.There is no biliary ductal dilation. Pancreas: Normal contours without ductal dilatation. No peripancreatic fluid collection. Spleen: Unremarkable. Adrenals/Urinary Tract: --Adrenal glands: Unremarkable. --Right kidney/ureter: No hydronephrosis or radiopaque kidney stones. --Left kidney/ureter: No hydronephrosis or radiopaque kidney stones. --Urinary bladder: Unremarkable. Stomach/Bowel: --Stomach/Duodenum: No hiatal hernia or other gastric abnormality. Normal duodenal course and caliber. --Small bowel: Unremarkable. --Colon: Rectosigmoid diverticulosis without acute inflammation. --Appendix: Not visualized. No right lower quadrant inflammation or free fluid. Vascular/Lymphatic: Atherosclerotic calcification is present within the  non-aneurysmal abdominal aorta, without hemodynamically significant stenosis. --No retroperitoneal lymphadenopathy. --No mesenteric lymphadenopathy. --No pelvic or inguinal lymphadenopathy. Reproductive: Status post hysterectomy. No adnexal mass. Other: No ascites or free air. There is a fat containing left inguinal hernia. Musculoskeletal. There is a chronic compression fracture of the L1 vertebral body. There are degenerative changes of the lumbar spine without evidence for an acute fracture. IMPRESSION: 1. No acute abdominopelvic abnormality. 2. Rectosigmoid diverticulosis without acute inflammation. 3. Fat containing left inguinal hernia. Aortic Atherosclerosis (ICD10-I70.0). Electronically Signed   By: Constance Holster M.D.   On: 11/06/2020 22:01       Assessment & Plan:   Problem List Items Addressed This Visit     Aortic atherosclerosis ([place])    Continue crestor.       Atherosclerosis of native arteries of extremity with intermittent claudication [facility])    Has been evaluated by Dr Delana Meyer.  Continue crestor.  Stable.       Bronchiectasis without complication (HCC)    Breathing stable.       CAD (coronary artery disease)    No chest pain.  Continue risk factor modification.       Fall    Had the episode as outlined.  No residual injuries.  Previous w/up for LOC.  EEG negative.  Monitor unrevealing.  Unclear if LOC on this episode.  Denies.  Discussed further w/up.  Declines.  Wants to monitor.        Gastroparesis due to DM ([place])    Followed by GI. On reglan. Stable.  Follow.       Hypercholesterolemia    Continue crestor.  Low cholesterol diet and exercise.  Follow lipid panel and liver function tests.        Relevant  Orders   Hepatic function panel   Lipid panel   Hypertension - Primary    Continue amlodipine.  Blood pressure as outlined.  No changes.  Follow pressures.  Follow metabolic panel.       Relevant Orders   Basic Metabolic Panel (BMET)   Peripheral  neuropathy    Stable. Desires no medication.  Discussed.       Type 2 diabetes mellitus with hyperglycemia (HCC)    Low carb diet and exercise.  Continues on jardiance and amaryl.  Follow met b and a1c.  8.1 - recent a1c.  Given 90 - ok.  Discussed diet and exercise.        Relevant Orders   Hemoglobin X9P   Basic Metabolic Panel (BMET)   Other Visit Diagnoses     Need for immunization against influenza       Relevant Orders   Flu Vaccine QUAD High Dose(Fluad) (Completed)        Einar Pheasant, MD

## 2022-05-12 ENCOUNTER — Encounter: Payer: Self-pay | Admitting: Internal Medicine

## 2022-05-12 DIAGNOSIS — W19XXXA Unspecified fall, initial encounter: Secondary | ICD-10-CM | POA: Insufficient documentation

## 2022-05-12 NOTE — Assessment & Plan Note (Signed)
No chest pain.  Continue risk factor modification.  

## 2022-05-12 NOTE — Assessment & Plan Note (Signed)
Continue amlodipine.  Blood pressure as outlined.  No changes.  Follow pressures.  Follow metabolic panel.  

## 2022-05-12 NOTE — Assessment & Plan Note (Signed)
Followed by GI. On reglan. Stable.  Follow.

## 2022-05-12 NOTE — Assessment & Plan Note (Addendum)
Had the episode as outlined.  No residual injuries.  Previous w/up for LOC.  EEG negative.  Monitor unrevealing.  Unclear if LOC on this episode.  Denies.  Discussed further w/up.  Declines.  Wants to monitor.

## 2022-05-12 NOTE — Assessment & Plan Note (Signed)
Stable. Desires no medication.  Discussed.

## 2022-05-12 NOTE — Assessment & Plan Note (Signed)
Continue crestor 

## 2022-05-12 NOTE — Assessment & Plan Note (Signed)
Has been evaluated by Dr Schnier.  Continue crestor.  Stable.  

## 2022-05-12 NOTE — Assessment & Plan Note (Signed)
Breathing stable.

## 2022-05-12 NOTE — Assessment & Plan Note (Signed)
Continue crestor.  Low cholesterol diet and exercise. Follow lipid panel and liver function tests.   

## 2022-05-12 NOTE — Assessment & Plan Note (Signed)
Low carb diet and exercise.  Continues on jardiance and amaryl.  Follow met b and a1c.  8.1 - recent a1c.  Given 90 - ok.  Discussed diet and exercise.

## 2022-05-22 DIAGNOSIS — H353211 Exudative age-related macular degeneration, right eye, with active choroidal neovascularization: Secondary | ICD-10-CM | POA: Diagnosis not present

## 2022-05-22 DIAGNOSIS — H353221 Exudative age-related macular degeneration, left eye, with active choroidal neovascularization: Secondary | ICD-10-CM | POA: Diagnosis not present

## 2022-06-06 ENCOUNTER — Other Ambulatory Visit: Payer: Self-pay | Admitting: Internal Medicine

## 2022-08-28 DIAGNOSIS — H353221 Exudative age-related macular degeneration, left eye, with active choroidal neovascularization: Secondary | ICD-10-CM | POA: Diagnosis not present

## 2022-08-28 DIAGNOSIS — H353231 Exudative age-related macular degeneration, bilateral, with active choroidal neovascularization: Secondary | ICD-10-CM | POA: Diagnosis not present

## 2022-08-28 DIAGNOSIS — H353211 Exudative age-related macular degeneration, right eye, with active choroidal neovascularization: Secondary | ICD-10-CM | POA: Diagnosis not present

## 2022-08-29 ENCOUNTER — Other Ambulatory Visit (INDEPENDENT_AMBULATORY_CARE_PROVIDER_SITE_OTHER): Payer: Medicare Other

## 2022-08-29 DIAGNOSIS — E7800 Pure hypercholesterolemia, unspecified: Secondary | ICD-10-CM | POA: Diagnosis not present

## 2022-08-29 DIAGNOSIS — I10 Essential (primary) hypertension: Secondary | ICD-10-CM

## 2022-08-29 DIAGNOSIS — E1165 Type 2 diabetes mellitus with hyperglycemia: Secondary | ICD-10-CM | POA: Diagnosis not present

## 2022-08-29 LAB — HEPATIC FUNCTION PANEL
ALT: 10 U/L (ref 0–35)
AST: 13 U/L (ref 0–37)
Albumin: 4.2 g/dL (ref 3.5–5.2)
Alkaline Phosphatase: 37 U/L — ABNORMAL LOW (ref 39–117)
Bilirubin, Direct: 0.2 mg/dL (ref 0.0–0.3)
Total Bilirubin: 0.9 mg/dL (ref 0.2–1.2)
Total Protein: 7 g/dL (ref 6.0–8.3)

## 2022-08-29 LAB — BASIC METABOLIC PANEL
BUN: 22 mg/dL (ref 6–23)
CO2: 23 mEq/L (ref 19–32)
Calcium: 8.9 mg/dL (ref 8.4–10.5)
Chloride: 103 mEq/L (ref 96–112)
Creatinine, Ser: 0.78 mg/dL (ref 0.40–1.20)
GFR: 66.68 mL/min (ref >60.00)
Glucose, Bld: 181 mg/dL — ABNORMAL HIGH (ref 70–99)
Potassium: 3.9 mEq/L (ref 3.5–5.1)
Sodium: 139 mEq/L (ref 135–145)

## 2022-08-29 LAB — HEMOGLOBIN A1C: Hgb A1c MFr Bld: 8.4 % — ABNORMAL HIGH (ref 4.6–6.5)

## 2022-08-29 LAB — LIPID PANEL
Cholesterol: 169 mg/dL (ref 0–200)
HDL: 62.7 mg/dL (ref >39.00)
LDL Cholesterol: 88 mg/dL (ref 0–99)
NonHDL: 106.7
Total CHOL/HDL Ratio: 3
Triglycerides: 93 mg/dL (ref 0.0–149.0)
VLDL: 18.6 mg/dL (ref 0.0–40.0)

## 2022-09-05 ENCOUNTER — Ambulatory Visit (INDEPENDENT_AMBULATORY_CARE_PROVIDER_SITE_OTHER): Payer: Medicare Other

## 2022-09-05 ENCOUNTER — Encounter: Payer: Self-pay | Admitting: Internal Medicine

## 2022-09-05 ENCOUNTER — Ambulatory Visit (INDEPENDENT_AMBULATORY_CARE_PROVIDER_SITE_OTHER): Payer: Medicare Other | Admitting: Internal Medicine

## 2022-09-05 VITALS — BP 130/70 | HR 81 | Temp 97.8°F | Resp 16 | Ht 66.0 in | Wt 149.0 lb

## 2022-09-05 DIAGNOSIS — M25551 Pain in right hip: Secondary | ICD-10-CM

## 2022-09-05 DIAGNOSIS — K3184 Gastroparesis: Secondary | ICD-10-CM

## 2022-09-05 DIAGNOSIS — I700 Atherosclerosis of aorta: Secondary | ICD-10-CM | POA: Diagnosis not present

## 2022-09-05 DIAGNOSIS — E1143 Type 2 diabetes mellitus with diabetic autonomic (poly)neuropathy: Secondary | ICD-10-CM

## 2022-09-05 DIAGNOSIS — I2510 Atherosclerotic heart disease of native coronary artery without angina pectoris: Secondary | ICD-10-CM | POA: Diagnosis not present

## 2022-09-05 DIAGNOSIS — J479 Bronchiectasis, uncomplicated: Secondary | ICD-10-CM | POA: Diagnosis not present

## 2022-09-05 DIAGNOSIS — E7800 Pure hypercholesterolemia, unspecified: Secondary | ICD-10-CM | POA: Diagnosis not present

## 2022-09-05 DIAGNOSIS — M7918 Myalgia, other site: Secondary | ICD-10-CM | POA: Diagnosis not present

## 2022-09-05 DIAGNOSIS — M1611 Unilateral primary osteoarthritis, right hip: Secondary | ICD-10-CM | POA: Diagnosis not present

## 2022-09-05 DIAGNOSIS — M810 Age-related osteoporosis without current pathological fracture: Secondary | ICD-10-CM

## 2022-09-05 DIAGNOSIS — I10 Essential (primary) hypertension: Secondary | ICD-10-CM | POA: Diagnosis not present

## 2022-09-05 DIAGNOSIS — Z0000 Encounter for general adult medical examination without abnormal findings: Secondary | ICD-10-CM

## 2022-09-05 DIAGNOSIS — I70213 Atherosclerosis of native arteries of extremities with intermittent claudication, bilateral legs: Secondary | ICD-10-CM

## 2022-09-05 DIAGNOSIS — S32010D Wedge compression fracture of first lumbar vertebra, subsequent encounter for fracture with routine healing: Secondary | ICD-10-CM

## 2022-09-05 DIAGNOSIS — E1165 Type 2 diabetes mellitus with hyperglycemia: Secondary | ICD-10-CM

## 2022-09-05 DIAGNOSIS — M545 Low back pain, unspecified: Secondary | ICD-10-CM | POA: Diagnosis not present

## 2022-09-05 NOTE — Progress Notes (Signed)
Subjective:    Patient ID: Debra Kirk, female    DOB: 05/18/1930, 89 y.o.   MRN: [id number]  Patient here for  Chief Complaint  Patient presents with   Annual Exam    HPI With past history of diabetes, hypertension and hypercholesterolemia.  She comes in today to follow up on these issues as well as for a complete physical exam.  She is accompanied by her daughter.  History obtained from both of them. Continues on jardiance and amaryl.  A1c 8.4. given 90, will hold on additional medication  states she has been eating more sweets.  Discussed monitoring carb/sweet intake.  No chest pain.  Breathing stable.  Does report right side pain - around to groin.  Present for several weeks.  Sitting worse.  Tender to touch.  Also reports perivaginal rash.  Feels may be related to wearing pads.  Some soft stool.  Hard to get to the bathroom in time.  Discussed benefiber.     Past Medical History:  Diagnosis Date   Carotid artery disease ([place])    Compression fracture    s/p fosamax, prolia   Diabetes mellitus (HCC)    Femoral neuropathy    left, elevated CRP, ESR, negative FANA and ANCA   Fibrocystic breast disease    Hypercholesterolemia    Hypertension    Past Surgical History:  Procedure Laterality Date   APPENDECTOMY     CHOLECYSTECTOMY     HERNIA REPAIR     INGUINAL HERNIA REPAIR     left x 1, right x 2   OVARIAN CYST REMOVAL     PARTIAL HYSTERECTOMY     Family History  Problem Relation Age of Onset   Heart disease Father        myocardial infarction   Heart disease Mother        myocardial infarction   Diabetes Mother    Congestive Heart Failure Mother    Bone cancer Sister    Diabetes Sister        x2   Ovarian cancer Sister 45   Heart disease Sister        CABG   Rheum arthritis Sister    Breast cancer Neg Hx    Colon cancer Neg Hx    Social History   Socioeconomic History   Marital status: Widowed    Spouse name: Not on file   Number of children: 3    Years of education: Not on file   Highest education level: Not on file  Occupational History   Not on file  Tobacco Use   Smoking status: Former    Types: Cigarettes    Quit date: 08/20/1958    Years since quitting: 64.0   Smokeless tobacco: Never  Substance and Sexual Activity   Alcohol use: No    Alcohol/week: 0.0 standard drinks of alcohol   Drug use: No   Sexual activity: Never  Other Topics Concern   Not on file  Social History Narrative   Not on file   Social Determinants of Health   Financial Resource Strain: Not on file  Food Insecurity: Not on file  Transportation Needs: Not on file  Physical Activity: Not on file  Stress: Not on file  Social Connections: Not on file     Review of Systems  Constitutional:  Negative for appetite change and unexpected weight change.  HENT:  Negative for congestion, sinus pressure and sore throat.   Eyes:  Negative for pain and  visual disturbance.  Respiratory:  Negative for cough and chest tightness.        Breathing stable.   Cardiovascular:  Negative for chest pain, palpitations and leg swelling.  Gastrointestinal:  Negative for abdominal pain, nausea and vomiting.       Soft stool as outlined.   Genitourinary:  Negative for difficulty urinating and dysuria.  Musculoskeletal:  Negative for joint swelling and myalgias.       Right side/groin pain as outlined.   Skin:  Negative for color change and rash.  Neurological:  Negative for dizziness and headaches.  Hematological:  Negative for adenopathy. Does not bruise/bleed easily.  Psychiatric/Behavioral:  Negative for agitation and dysphoric mood.        Objective:     BP 130/70   Pulse 81   Temp 97.8 F (36.6 C)   Resp 16   Ht '5\' 6"'$  (1.676 m)   Wt 149 lb (67.6 kg)   SpO2 96%   BMI 24.05 kg/m  Wt Readings from Last 3 Encounters:  09/05/22 149 lb (67.6 kg)  05/02/22 146 lb 3.2 oz (66.3 kg)  12/27/21 140 lb 3.2 oz (63.6 kg)    Physical Exam Vitals reviewed.   Constitutional:      General: She is not in acute distress.    Appearance: Normal appearance. She is well-developed.  HENT:     Head: Normocephalic and atraumatic.     Right Ear: External ear normal.     Left Ear: External ear normal.  Eyes:     General: No scleral icterus.       Right eye: No discharge.        Left eye: No discharge.     Conjunctiva/sclera: Conjunctivae normal.  Neck:     Thyroid: No thyromegaly.  Cardiovascular:     Rate and Rhythm: Normal rate and regular rhythm.  Pulmonary:     Effort: No tachypnea, accessory muscle usage or respiratory distress.     Breath sounds: Normal breath sounds. No decreased breath sounds or wheezing.  Chest:  Breasts:    Right: No inverted nipple, mass, nipple discharge or tenderness (no axillary adenopathy).     Left: No inverted nipple, mass, nipple discharge or tenderness (no axilarry adenopathy).  Abdominal:     General: Bowel sounds are normal.     Palpations: Abdomen is soft.     Tenderness: There is no abdominal tenderness.  Musculoskeletal:        General: No swelling.     Cervical back: Neck supple.     Comments: Increased pain with adduction/abduction - lower extremity.  Increased pain - rotation of leg.    Lymphadenopathy:     Cervical: No cervical adenopathy.  Skin:    Findings: No erythema or rash.  Neurological:     Mental Status: She is alert and oriented to person, place, and time.  Psychiatric:        Mood and Affect: Mood normal.        Behavior: Behavior normal.      Outpatient Encounter Medications as of 09/05/2022  Medication Sig   AMBULATORY NON FORMULARY MEDICATION Medication Name: Incentive Spirometry Use 10-15 times daily.   amLODipine (NORVASC) 5 MG tablet Take 1 tablet (5 mg total) by mouth daily.   Calcium Carbonate-Vitamin D 600-400 MG-UNIT tablet Take 1 tablet by mouth 2 (two) times daily.   CONTOUR TEST test strip CHECK DAILY AND AS NEEDED **E11.9**   denosumab (PROLIA) 60 MG/ML SOLN  injection Inject  60 mg into the skin every 6 (six) months. Administer in upper arm, thigh, or abdomen   dicyclomine (BENTYL) 10 MG capsule Take 10 mg by mouth 3 (three) times daily before meals.    empagliflozin (JARDIANCE) 25 MG TABS tablet Take 1 tablet (25 mg total) by mouth daily.   glimepiride (AMARYL) 2 MG tablet TAKE 1 TABLET BY MOUTH  TWICE DAILY   meclizine (ANTIVERT) 12.5 MG tablet Take 1 tablet (12.5 mg total) by mouth 2 (two) times daily as needed for dizziness.   metoCLOPramide (REGLAN) 5 MG tablet Take 5 mg by mouth 2 (two) times daily.    omeprazole (PRILOSEC) 20 MG capsule Take 1 capsule (20 mg total) by mouth 2 (two) times daily.   Respiratory Therapy Supplies (FLUTTER) DEVI Use 10-15 times daily   rosuvastatin (CRESTOR) 20 MG tablet TAKE 1 TABLET BY MOUTH DAILY   No facility-administered encounter medications on file as of 09/05/2022.     Lab Results  Component Value Date   WBC 5.3 12/20/2021   HGB 14.3 12/20/2021   HCT 43.5 12/20/2021   PLT 174.0 12/20/2021   GLUCOSE 181 (H) 08/29/2022   CHOL 169 08/29/2022   TRIG 93.0 08/29/2022   HDL 62.70 08/29/2022   LDLCALC 88 08/29/2022   ALT 10 08/29/2022   AST 13 08/29/2022   NA 139 08/29/2022   K 3.9 08/29/2022   CL 103 08/29/2022   CREATININE 0.78 08/29/2022   BUN 22 08/29/2022   CO2 23 08/29/2022   TSH 1.63 12/20/2021   HGBA1C 8.4 (H) 08/29/2022   MICROALBUR 6.2 (H) 12/20/2021    CT Abdomen Pelvis Wo Contrast  Result Date: 11/06/2020 CLINICAL DATA:  Left lower quadrant abdominal pain. EXAM: CT ABDOMEN AND PELVIS WITHOUT CONTRAST TECHNIQUE: Multidetector CT imaging of the abdomen and pelvis was performed following the standard protocol without IV contrast. COMPARISON:  03/26/2000 FINDINGS: Lower chest: The lung bases are clear. The heart size is normal. Hepatobiliary: The liver is normal. Status post cholecystectomy.There is no biliary ductal dilation. Pancreas: Normal contours without ductal dilatation. No  peripancreatic fluid collection. Spleen: Unremarkable. Adrenals/Urinary Tract: --Adrenal glands: Unremarkable. --Right kidney/ureter: No hydronephrosis or radiopaque kidney stones. --Left kidney/ureter: No hydronephrosis or radiopaque kidney stones. --Urinary bladder: Unremarkable. Stomach/Bowel: --Stomach/Duodenum: No hiatal hernia or other gastric abnormality. Normal duodenal course and caliber. --Small bowel: Unremarkable. --Colon: Rectosigmoid diverticulosis without acute inflammation. --Appendix: Not visualized. No right lower quadrant inflammation or free fluid. Vascular/Lymphatic: Atherosclerotic calcification is present within the non-aneurysmal abdominal aorta, without hemodynamically significant stenosis. --No retroperitoneal lymphadenopathy. --No mesenteric lymphadenopathy. --No pelvic or inguinal lymphadenopathy. Reproductive: Status post hysterectomy. No adnexal mass. Other: No ascites or free air. There is a fat containing left inguinal hernia. Musculoskeletal. There is a chronic compression fracture of the L1 vertebral body. There are degenerative changes of the lumbar spine without evidence for an acute fracture. IMPRESSION: 1. No acute abdominopelvic abnormality. 2. Rectosigmoid diverticulosis without acute inflammation. 3. Fat containing left inguinal hernia. Aortic Atherosclerosis (ICD10-I70.0). Electronically Signed   By: Constance Holster M.D.   On: 11/06/2020 22:01       Assessment & Plan:  Health care maintenance Assessment & Plan: Physical today 09/05/22.  Mammogram 06/12/17 - Birads II.  Declines further mammograms.  Colonoscopy 33/1659.  89 years old now.  Hold colon screening.     Right hip pain Assessment & Plan: Increased pain as outlined.  Present for several weeks now.  No known injury or trauma that triggered.  Xray.  Further w/up  pending results.    Orders: -     DG Lumbar Spine 2-3 Views; Future -     DG HIP UNILAT W OR W/O PELVIS 2-3 VIEWS RIGHT; Future  Right  buttock pain Assessment & Plan: Increased pain as outlined.  Present for several weeks now.  No known injury or trauma that triggered.  Xray.  Further w/up pending results.    Orders: -     DG Lumbar Spine 2-3 Views; Future -     DG HIP UNILAT W OR W/O PELVIS 2-3 VIEWS RIGHT; Future  Primary hypertension Assessment & Plan: Continue amlodipine.  Blood pressure as outlined.  No changes.  Follow pressures.  Follow metabolic panel.   Orders: -     Basic metabolic panel; Future  Hypercholesterolemia Assessment & Plan: Continue crestor.  Low cholesterol diet and exercise.  Follow lipid panel and liver function tests.    Orders: -     CBC with Differential/Platelet; Future -     Hepatic function panel; Future -     Lipid panel; Future -     TSH; Future  Type 2 diabetes mellitus with hyperglycemia, without long-term current use of insulin (HCC) Assessment & Plan: Low carb diet and exercise.  Continues on jardiance and amaryl.  Follow met b and a1c.  8.4- recent a1c.  Given 90 - hold on making changes.   Discussed diet and exercise.    Orders: -     Hemoglobin A1c; Future  Aortic atherosclerosis ([place]) Assessment & Plan: Continue crestor.    Atherosclerosis of native artery of both lower extremities with intermittent claudication [facility]) Assessment & Plan: Has been evaluated by Dr Delana Meyer.  Continue crestor.  Stable.    Bronchiectasis without complication ([place]) Assessment & Plan: Breathing stable.    Coronary artery disease involving native coronary artery of native heart without angina pectoris Assessment & Plan: No chest pain.  Continue risk factor modification.    Compression fracture of L1 vertebra with routine healing, subsequent encounter Assessment & Plan: Seeing endocrinology.  Receiving prolia injections.    Gastroparesis due to DM [facility] Assessment & Plan: Followed by GI. On reglan. Bowel issue as outlined.  She reports this is c/w previous symptoms when her  gastroparesis is "flaring".  On reglan - per GI.  Discussed benefiber.  Follow.    Osteoporosis without current pathological fracture, unspecified osteoporosis type Assessment & Plan: Receiving prolia.       Einar Pheasant, MD

## 2022-09-05 NOTE — Assessment & Plan Note (Signed)
Physical today 09/05/22.  Mammogram 06/12/17 - Birads II.  Declines further mammograms.  Colonoscopy 51/4330.  89 years old now.  Hold colon screening.

## 2022-09-08 ENCOUNTER — Other Ambulatory Visit: Payer: Self-pay | Admitting: Internal Medicine

## 2022-09-08 DIAGNOSIS — R9389 Abnormal findings on diagnostic imaging of other specified body structures: Secondary | ICD-10-CM

## 2022-09-08 NOTE — Progress Notes (Signed)
Order placed for bone scan.

## 2022-09-09 ENCOUNTER — Encounter: Payer: Self-pay | Admitting: Internal Medicine

## 2022-09-09 NOTE — Assessment & Plan Note (Signed)
Increased pain as outlined.  Present for several weeks now.  No known injury or trauma that triggered.  Xray.  Further w/up pending results.

## 2022-09-09 NOTE — Assessment & Plan Note (Signed)
Has been evaluated by Dr Schnier.  Continue crestor.  Stable.  

## 2022-09-09 NOTE — Assessment & Plan Note (Signed)
Receiving prolia.  

## 2022-09-09 NOTE — Assessment & Plan Note (Addendum)
Followed by GI. On reglan. Bowel issue as outlined.  She reports this is c/w previous symptoms when her gastroparesis is "flaring".  On reglan - per GI.  Discussed benefiber.  Follow.

## 2022-09-09 NOTE — Assessment & Plan Note (Signed)
Seeing endocrinology.  Receiving prolia injections.

## 2022-09-09 NOTE — Assessment & Plan Note (Signed)
Continue crestor.  Low cholesterol diet and exercise. Follow lipid panel and liver function tests.   

## 2022-09-09 NOTE — Assessment & Plan Note (Signed)
Continue amlodipine.  Blood pressure as outlined.  No changes.  Follow pressures.  Follow metabolic panel.  

## 2022-09-09 NOTE — Assessment & Plan Note (Signed)
Continue crestor 

## 2022-09-09 NOTE — Assessment & Plan Note (Signed)
Low carb diet and exercise.  Continues on jardiance and amaryl.  Follow met b and a1c.  8.4- recent a1c.  Given 90 - hold on making changes.   Discussed diet and exercise.   

## 2022-09-09 NOTE — Assessment & Plan Note (Signed)
Breathing stable.

## 2022-09-09 NOTE — Assessment & Plan Note (Signed)
No chest pain.  Continue risk factor modification.

## 2022-09-19 ENCOUNTER — Encounter
Admission: RE | Admit: 2022-09-19 | Discharge: 2022-09-19 | Disposition: A | Discharge status: Home / Self Care | Payer: Medicare Other | Source: Ambulatory Visit | Attending: Internal Medicine | Admitting: Internal Medicine

## 2022-09-19 ENCOUNTER — Ambulatory Visit
Admission: RE | Admit: 2022-09-19 | Discharge: 2022-09-19 | Disposition: A | Discharge status: Home / Self Care | Payer: Medicare Other | Source: Ambulatory Visit | Attending: Internal Medicine | Admitting: Internal Medicine

## 2022-09-19 DIAGNOSIS — R9389 Abnormal findings on diagnostic imaging of other specified body structures: Secondary | ICD-10-CM | POA: Diagnosis not present

## 2022-09-19 DIAGNOSIS — M5136 Other intervertebral disc degeneration, lumbar region: Secondary | ICD-10-CM | POA: Diagnosis not present

## 2022-09-19 DIAGNOSIS — M47817 Spondylosis without myelopathy or radiculopathy, lumbosacral region: Secondary | ICD-10-CM | POA: Diagnosis not present

## 2022-09-19 DIAGNOSIS — M419 Scoliosis, unspecified: Secondary | ICD-10-CM | POA: Diagnosis not present

## 2022-09-19 MED ORDER — TECHNETIUM TC 99M MEDRONATE IV KIT
20.0000 | PACK | Freq: Once | INTRAVENOUS | Status: AC | PRN
  Administered 2022-09-19: 23.36 via INTRAVENOUS

## 2022-09-25 ENCOUNTER — Telehealth: Payer: Self-pay | Admitting: Internal Medicine

## 2022-09-25 NOTE — Telephone Encounter (Signed)
Pt daughter called wanting to get scan results for pt

## 2022-09-25 NOTE — Telephone Encounter (Signed)
See result note.  

## 2022-09-26 ENCOUNTER — Other Ambulatory Visit: Payer: Self-pay

## 2022-09-26 DIAGNOSIS — R948 Abnormal results of function studies of other organs and systems: Secondary | ICD-10-CM

## 2022-09-29 ENCOUNTER — Other Ambulatory Visit: Payer: Self-pay | Admitting: Internal Medicine

## 2022-10-01 ENCOUNTER — Other Ambulatory Visit: Payer: Medicare Other

## 2022-10-01 ENCOUNTER — Ambulatory Visit (INDEPENDENT_AMBULATORY_CARE_PROVIDER_SITE_OTHER): Payer: Medicare Other

## 2022-10-01 DIAGNOSIS — M1611 Unilateral primary osteoarthritis, right hip: Secondary | ICD-10-CM | POA: Diagnosis not present

## 2022-10-01 DIAGNOSIS — R9389 Abnormal findings on diagnostic imaging of other specified body structures: Secondary | ICD-10-CM | POA: Diagnosis not present

## 2022-10-01 DIAGNOSIS — R948 Abnormal results of function studies of other organs and systems: Secondary | ICD-10-CM

## 2022-10-01 DIAGNOSIS — I878 Other specified disorders of veins: Secondary | ICD-10-CM | POA: Diagnosis not present

## 2022-10-01 DIAGNOSIS — I70201 Unspecified atherosclerosis of native arteries of extremities, right leg: Secondary | ICD-10-CM | POA: Diagnosis not present

## 2022-10-04 ENCOUNTER — Ambulatory Visit (INDEPENDENT_AMBULATORY_CARE_PROVIDER_SITE_OTHER): Payer: Medicare Other | Admitting: Internal Medicine

## 2022-10-04 ENCOUNTER — Encounter: Payer: Self-pay | Admitting: Internal Medicine

## 2022-10-04 VITALS — BP 126/70 | HR 80 | Temp 97.8°F | Resp 16 | Ht 66.0 in | Wt 147.0 lb

## 2022-10-04 DIAGNOSIS — E1143 Type 2 diabetes mellitus with diabetic autonomic (poly)neuropathy: Secondary | ICD-10-CM

## 2022-10-04 DIAGNOSIS — I10 Essential (primary) hypertension: Secondary | ICD-10-CM | POA: Diagnosis not present

## 2022-10-04 DIAGNOSIS — R32 Unspecified urinary incontinence: Secondary | ICD-10-CM | POA: Diagnosis not present

## 2022-10-04 DIAGNOSIS — M810 Age-related osteoporosis without current pathological fracture: Secondary | ICD-10-CM | POA: Diagnosis not present

## 2022-10-04 DIAGNOSIS — R948 Abnormal results of function studies of other organs and systems: Secondary | ICD-10-CM

## 2022-10-04 DIAGNOSIS — K3184 Gastroparesis: Secondary | ICD-10-CM

## 2022-10-04 DIAGNOSIS — J479 Bronchiectasis, uncomplicated: Secondary | ICD-10-CM

## 2022-10-04 DIAGNOSIS — I700 Atherosclerosis of aorta: Secondary | ICD-10-CM

## 2022-10-04 DIAGNOSIS — E1165 Type 2 diabetes mellitus with hyperglycemia: Secondary | ICD-10-CM | POA: Diagnosis not present

## 2022-10-04 DIAGNOSIS — E7800 Pure hypercholesterolemia, unspecified: Secondary | ICD-10-CM | POA: Diagnosis not present

## 2022-10-04 DIAGNOSIS — I70213 Atherosclerosis of native arteries of extremities with intermittent claudication, bilateral legs: Secondary | ICD-10-CM | POA: Diagnosis not present

## 2022-10-04 DIAGNOSIS — R350 Frequency of micturition: Secondary | ICD-10-CM | POA: Diagnosis not present

## 2022-10-04 NOTE — Progress Notes (Signed)
Subjective:    Patient ID: Debra Kirk, female    DOB: 12/06/1929, 89 y.o.   MRN: [id number]  Patient here for  Chief Complaint  Patient presents with   Bladder Incontinence    HPI Work in appt. - concern regarding vaginal lesion. On questioning, she is concerned regarding a possible UTI.  Increased urinary frequency and some incontinence.  She can sense having to urinate - urgency when does sense.  Discussed increased nocturia and possible sleep apnea.  She is concerned regarding possible UTI and wants to be checked. Desires no further testing right now other than checking for UTI.  Breathing stable.  Eating.  No nausea or vomiting.  Discussed recent bone scan results and recommendation for CT scan - after discussed with radiology. No abdominal pain.  Bowels moving.    Past Medical History:  Diagnosis Date   Carotid artery disease ([place])    Compression fracture    s/p fosamax, prolia   Diabetes mellitus (HCC)    Femoral neuropathy    left, elevated CRP, ESR, negative FANA and ANCA   Fibrocystic breast disease    Hypercholesterolemia    Hypertension    Past Surgical History:  Procedure Laterality Date   APPENDECTOMY     CHOLECYSTECTOMY     HERNIA REPAIR     INGUINAL HERNIA REPAIR     left x 1, right x 2   OVARIAN CYST REMOVAL     PARTIAL HYSTERECTOMY     Family History  Problem Relation Age of Onset   Heart disease Father        myocardial infarction   Heart disease Mother        myocardial infarction   Diabetes Mother    Congestive Heart Failure Mother    Bone cancer Sister    Diabetes Sister        x2   Ovarian cancer Sister 81   Heart disease Sister        CABG   Rheum arthritis Sister    Breast cancer Neg Hx    Colon cancer Neg Hx    Social History   Socioeconomic History   Marital status: Widowed    Spouse name: Not on file   Number of children: 3   Years of education: Not on file   Highest education level: Not on file  Occupational History    Not on file  Tobacco Use   Smoking status: Former    Types: Cigarettes    Quit date: 08/20/1958    Years since quitting: 64.1   Smokeless tobacco: Never  Substance and Sexual Activity   Alcohol use: No    Alcohol/week: 0.0 standard drinks of alcohol   Drug use: No   Sexual activity: Never  Other Topics Concern   Not on file  Social History Narrative   Not on file   Social Determinants of Health   Financial Resource Strain: Not on file  Food Insecurity: Not on file  Transportation Needs: Not on file  Physical Activity: Not on file  Stress: Not on file  Social Connections: Not on file     Review of Systems  Constitutional:  Negative for appetite change and fever.  HENT:  Negative for congestion and sinus pressure.   Respiratory:  Negative for cough and chest tightness.        Breathing stable.   Cardiovascular:  Negative for chest pain and palpitations.  Gastrointestinal:  Negative for abdominal pain, diarrhea, nausea and vomiting.  Genitourinary:  Positive for dysuria and frequency.  Musculoskeletal:  Negative for joint swelling and myalgias.  Skin:  Negative for color change and rash.  Neurological:  Negative for dizziness and headaches.  Psychiatric/Behavioral:  Negative for agitation and dysphoric mood.        Objective:     BP 126/70   Pulse 80   Temp 97.8 F (36.6 C)   Resp 16   Ht '5\' 6"'$  (1.676 m)   Wt 147 lb (66.7 kg)   SpO2 97%   BMI 23.73 kg/m  Wt Readings from Last 3 Encounters:  10/04/22 147 lb (66.7 kg)  09/05/22 149 lb (67.6 kg)  05/02/22 146 lb 3.2 oz (66.3 kg)    Physical Exam Vitals reviewed.  Constitutional:      General: She is not in acute distress.    Appearance: Normal appearance.  HENT:     Head: Normocephalic and atraumatic.     Right Ear: External ear normal.     Left Ear: External ear normal.  Eyes:     General: No scleral icterus.       Right eye: No discharge.        Left eye: No discharge.     Conjunctiva/sclera:  Conjunctivae normal.  Neck:     Thyroid: No thyromegaly.  Cardiovascular:     Rate and Rhythm: Normal rate and regular rhythm.  Pulmonary:     Effort: No respiratory distress.     Breath sounds: Normal breath sounds. No wheezing.  Abdominal:     General: Bowel sounds are normal.     Palpations: Abdomen is soft.     Tenderness: There is no abdominal tenderness.  Musculoskeletal:        General: No swelling or tenderness.     Cervical back: Neck supple. No tenderness.  Lymphadenopathy:     Cervical: No cervical adenopathy.  Skin:    Findings: No erythema or rash.  Neurological:     Mental Status: She is alert.  Psychiatric:        Mood and Affect: Mood normal.        Behavior: Behavior normal.      Outpatient Encounter Medications as of 10/04/2022  Medication Sig   AMBULATORY NON FORMULARY MEDICATION Medication Name: Incentive Spirometry Use 10-15 times daily.   amLODipine (NORVASC) 5 MG tablet Take 1 tablet (5 mg total) by mouth daily.   Calcium Carbonate-Vitamin D 600-400 MG-UNIT tablet Take 1 tablet by mouth 2 (two) times daily.   CONTOUR TEST test strip CHECK DAILY AND AS NEEDED **E11.9**   denosumab (PROLIA) 60 MG/ML SOLN injection Inject 60 mg into the skin every 6 (six) months. Administer in upper arm, thigh, or abdomen   dicyclomine (BENTYL) 10 MG capsule Take 10 mg by mouth 3 (three) times daily before meals.    glimepiride (AMARYL) 2 MG tablet TAKE 1 TABLET BY MOUTH TWICE  DAILY   JARDIANCE 25 MG TABS tablet TAKE 1 TABLET BY MOUTH DAILY   meclizine (ANTIVERT) 12.5 MG tablet Take 1 tablet (12.5 mg total) by mouth 2 (two) times daily as needed for dizziness.   metoCLOPramide (REGLAN) 5 MG tablet Take 5 mg by mouth 2 (two) times daily.    omeprazole (PRILOSEC) 20 MG capsule Take 1 capsule (20 mg total) by mouth 2 (two) times daily.   Respiratory Therapy Supplies (FLUTTER) DEVI Use 10-15 times daily   rosuvastatin (CRESTOR) 20 MG tablet TAKE 1 TABLET BY MOUTH DAILY    No facility-administered  encounter medications on file as of 10/04/2022.     Lab Results  Component Value Date   WBC 5.3 12/20/2021   HGB 14.3 12/20/2021   HCT 43.5 12/20/2021   PLT 174.0 12/20/2021   GLUCOSE 181 (H) 08/29/2022   CHOL 169 08/29/2022   TRIG 93.0 08/29/2022   HDL 62.70 08/29/2022   LDLCALC 88 08/29/2022   ALT 10 08/29/2022   AST 13 08/29/2022   NA 139 08/29/2022   K 3.9 08/29/2022   CL 103 08/29/2022   CREATININE 0.78 08/29/2022   BUN 22 08/29/2022   CO2 23 08/29/2022   TSH 1.63 12/20/2021   HGBA1C 8.4 (H) 08/29/2022   MICROALBUR 6.2 (H) 12/20/2021    NM Bone Scan Whole Body  Result Date: 09/20/2022 CLINICAL DATA:  Incidental bone lesion RIGHT femur at femoral head EXAM: NUCLEAR MEDICINE WHOLE BODY BONE SCAN TECHNIQUE: Whole body anterior and posterior images were obtained approximately 3 hours after intravenous injection of radiopharmaceutical. RADIOPHARMACEUTICALS:  23.36 mCi Technetium-38mMDP IV COMPARISON:  None available Correlation: RIGHT hip and lumbar spine radiographs of 09/05/2022 FINDINGS: Foci of abnormal tracer uptake located at multiple sites in the lumbar spine predominantly along the RIGHT lateral border of the levoconvex scoliosis. These likely reflect degenerative disc and facet disease changes seen on recent radiographs. Additional focal uptake at LEFT L5-S1 corresponding to degenerative changes and sclerosis on radiographs. Additional uptake at cervical spine shoulders, wrists, sternoclavicular joints, hips, appearance typically degenerative. A longitudinal focus of increased uptake is seen at the distal RIGHT femoral diaphysis, nonspecific. No additional worrisome sites of abnormal tracer accumulation. Expected urinary tract and soft tissue distribution of tracer. IMPRESSION: Multiple sites of abnormal tracer uptake at thoracic and lumbar spine and in multiple joints, typically degenerative, with uptake in the thoracolumbar spine corresponding to  advanced degenerative changes on radiographs. Longitudinal increased tracer uptake at distal RIGHT femoral diaphysis, nonspecific, recommend radiographic correlation to determine etiology. No abnormal focal tracer uptake at the RIGHT femoral head to suggest metastatic disease. Electronically Signed   By: MLavonia DanaM.D.   On: 09/20/2022 08:39       Assessment & Plan:  Urinary incontinence, unspecified type -     Urinalysis, Routine w reflex microscopic  Urinary frequency Assessment & Plan: Discussed nocturia and possible sleep apnea.  Also discussed OAB and incontinence.  Desires no further testing.  Desires no further medication.  Desires to be checked to confirm if UTI present.  Will notify me if desires any further testing or intervention. Check urinalysis to confirm no infection.   Orders: -     Urinalysis, Routine w reflex microscopic -     Urine Culture  Aortic atherosclerosis ([place] Assessment & Plan: Continue crestor.    Atherosclerosis of native artery of both lower extremities with intermittent claudication ([facility] Assessment & Plan: Has been evaluated by Dr SDelana Meyer  Continue crestor.  Stable.    [place] Assessment & Plan: Breathing stable.    Gastroparesis due to DM ([facility] Assessment & Plan: Followed by GI. On reglan. Bowel issues reported previously.  She reported this is c/w previous symptoms when her gastroparesis is "flaring".  Overall appears to be doing better currently. On reglan - per GI.  Have discussed benefiber.  Follow.    Hypercholesterolemia Assessment & Plan: Continue crestor.  Low cholesterol diet and exercise.  Follow lipid panel and liver function tests.     Primary hypertension Assessment & Plan: Continue amlodipine.  Blood pressure as outlined.  No changes.  Follow pressures.  Follow metabolic panel.    Osteoporosis without current pathological fracture, unspecified osteoporosis type Assessment &  Plan: Receiving prolia.    Type 2 diabetes mellitus with hyperglycemia, without long-term current use of insulin (HCC) Assessment & Plan: Low carb diet and exercise.  Continues on jardiance and amaryl.  Follow met b and a1c.  8.4- recent a1c.  Given 90 - hold on making changes.   Discussed diet and exercise.     Abnormal radionuclide bone scan Assessment & Plan: Longitudinal increased tracer uptake at distal RIGHT femoral diaphysis, nonspecific, recommend radiographic correlation to determine etiology. Xray femur - Linear calcific densities are noted over the distal femur in the region of previously identified bone scan linear increased uptake in the distal right femur. These could represent overlying vascular calcifications however a calcified bony lesion cannot be excluded. MRI of the right femur suggested for further evaluation. Discussed with radiology.  Recommended CT scan.  Discussed with Ms Kamphaus.  She declines.        Einar Pheasant, MD

## 2022-10-05 LAB — URINALYSIS, ROUTINE W REFLEX MICROSCOPIC
Bilirubin Urine: NEGATIVE
Hgb urine dipstick: NEGATIVE
Ketones, ur: NEGATIVE
Leukocytes,Ua: NEGATIVE
Nitrite: NEGATIVE
Specific Gravity, Urine: <=1.005 — AB (ref 1.000–1.030)
Total Protein, Urine: NEGATIVE
Urine Glucose: >=1000 — AB
Urobilinogen, UA: 0.2 (ref 0.0–1.0)
pH: 6 (ref 5.0–8.0)

## 2022-10-07 LAB — URINE CULTURE
MICRO NUMBER:: 14570302
SPECIMEN QUALITY:: ADEQUATE

## 2022-10-08 ENCOUNTER — Other Ambulatory Visit: Payer: Self-pay

## 2022-10-08 MED ORDER — NITROFURANTOIN MONOHYD MACRO 100 MG PO CAPS: 100.0000 mg | ORAL_CAPSULE | Freq: Two times a day (BID) | ORAL | 0 refills | Status: DC

## 2022-10-14 ENCOUNTER — Encounter: Payer: Self-pay | Admitting: Internal Medicine

## 2022-10-14 DIAGNOSIS — R948 Abnormal results of function studies of other organs and systems: Secondary | ICD-10-CM | POA: Insufficient documentation

## 2022-10-14 NOTE — Assessment & Plan Note (Addendum)
Longitudinal increased tracer uptake at distal RIGHT femoral diaphysis, nonspecific, recommend radiographic correlation to determine etiology. Xray femur - Linear calcific densities are noted over the distal femur in the region of previously identified bone scan linear increased uptake in the distal right femur. These could represent overlying vascular calcifications however a calcified bony lesion cannot be excluded. MRI of the right femur suggested for further evaluation. Discussed with radiology.  Recommended CT scan.  Discussed with Debra Kirk.  She declines.

## 2022-10-14 NOTE — Assessment & Plan Note (Signed)
Low carb diet and exercise.  Continues on jardiance and amaryl.  Follow met b and a1c.  8.4- recent a1c.  Given 90 - hold on making changes.   Discussed diet and exercise.

## 2022-10-14 NOTE — Assessment & Plan Note (Signed)
Followed by GI. On reglan. Bowel issues reported previously.  She reported this is c/w previous symptoms when her gastroparesis is "flaring".  Overall appears to be doing better currently. On reglan - per GI.  Have discussed benefiber.  Follow.

## 2022-10-14 NOTE — Assessment & Plan Note (Signed)
Continue amlodipine.  Blood pressure as outlined.  No changes.  Follow pressures.  Follow metabolic panel.

## 2022-10-14 NOTE — Assessment & Plan Note (Signed)
Continue crestor.  Low cholesterol diet and exercise.  Follow lipid panel and liver function tests.  

## 2022-10-14 NOTE — Assessment & Plan Note (Signed)
Has been evaluated by Dr Delana Meyer.  Continue crestor.  Stable.

## 2022-10-14 NOTE — Addendum Note (Signed)
Addended by: Alisa Graff on: 10/14/2022 09:09 AM   Modules accepted: Level of Service

## 2022-10-14 NOTE — Assessment & Plan Note (Signed)
Continue crestor 

## 2022-10-14 NOTE — Assessment & Plan Note (Addendum)
Discussed nocturia and possible sleep apnea.  Also discussed OAB and incontinence.  Desires no further testing.  Desires no further medication.  Desires to be checked to confirm if UTI present.  Will notify me if desires any further testing or intervention. Check urinalysis to confirm no infection.

## 2022-10-14 NOTE — Assessment & Plan Note (Signed)
Receiving prolia.

## 2022-10-14 NOTE — Assessment & Plan Note (Signed)
Breathing stable.

## 2022-10-29 ENCOUNTER — Telehealth: Payer: Self-pay | Admitting: *Deleted

## 2022-10-29 NOTE — Telephone Encounter (Signed)
$  0 due; PA not required: Pt scheduled on 11/06/22 @ 1:00 due to transportation.

## 2022-11-06 ENCOUNTER — Other Ambulatory Visit: Payer: Self-pay | Admitting: Internal Medicine

## 2022-11-06 ENCOUNTER — Ambulatory Visit (INDEPENDENT_AMBULATORY_CARE_PROVIDER_SITE_OTHER): Payer: Medicare Other

## 2022-11-06 DIAGNOSIS — M810 Age-related osteoporosis without current pathological fracture: Secondary | ICD-10-CM | POA: Diagnosis not present

## 2022-11-06 DIAGNOSIS — E1143 Type 2 diabetes mellitus with diabetic autonomic (poly)neuropathy: Secondary | ICD-10-CM | POA: Diagnosis not present

## 2022-11-06 DIAGNOSIS — K3184 Gastroparesis: Secondary | ICD-10-CM | POA: Diagnosis not present

## 2022-11-06 DIAGNOSIS — K219 Gastro-esophageal reflux disease without esophagitis: Secondary | ICD-10-CM | POA: Diagnosis not present

## 2022-11-06 DIAGNOSIS — R198 Other specified symptoms and signs involving the digestive system and abdomen: Secondary | ICD-10-CM | POA: Diagnosis not present

## 2022-11-06 MED ORDER — DENOSUMAB 60 MG/ML ~~LOC~~ SOSY
60.0000 mg | PREFILLED_SYRINGE | Freq: Once | SUBCUTANEOUS | Status: AC
  Administered 2022-11-06: 60 mg via SUBCUTANEOUS

## 2022-11-06 NOTE — Progress Notes (Signed)
Order placed for vitamin D lab to be drawn with next labs.

## 2022-11-06 NOTE — Progress Notes (Signed)
Patient arrived for her Prolia injection and was administered into her right arm. Patient tolerated the injection well and did not show any signs of distress or voice any concerns.

## 2022-11-26 ENCOUNTER — Telehealth: Payer: Self-pay | Admitting: Internal Medicine

## 2022-11-26 ENCOUNTER — Other Ambulatory Visit (INDEPENDENT_AMBULATORY_CARE_PROVIDER_SITE_OTHER): Payer: Medicare Other

## 2022-11-26 ENCOUNTER — Encounter: Payer: Self-pay | Admitting: Internal Medicine

## 2022-11-26 ENCOUNTER — Telehealth (INDEPENDENT_AMBULATORY_CARE_PROVIDER_SITE_OTHER): Payer: Medicare Other | Admitting: Internal Medicine

## 2022-11-26 VITALS — Ht 66.0 in | Wt 147.0 lb

## 2022-11-26 DIAGNOSIS — R350 Frequency of micturition: Secondary | ICD-10-CM | POA: Diagnosis not present

## 2022-11-26 DIAGNOSIS — R300 Dysuria: Secondary | ICD-10-CM | POA: Diagnosis not present

## 2022-11-26 DIAGNOSIS — R948 Abnormal results of function studies of other organs and systems: Secondary | ICD-10-CM

## 2022-11-26 DIAGNOSIS — I10 Essential (primary) hypertension: Secondary | ICD-10-CM | POA: Diagnosis not present

## 2022-11-26 LAB — URINALYSIS, ROUTINE W REFLEX MICROSCOPIC
Bilirubin Urine: NEGATIVE
Hgb urine dipstick: NEGATIVE
Ketones, ur: NEGATIVE
Leukocytes,Ua: NEGATIVE
Nitrite: NEGATIVE
RBC / HPF: NONE SEEN (ref 0–?)
Specific Gravity, Urine: <=1.005 — AB (ref 1.000–1.030)
Total Protein, Urine: NEGATIVE
Urine Glucose: >=1000 — AB
Urobilinogen, UA: 0.2 (ref 0.0–1.0)
pH: 6.5 (ref 5.0–8.0)

## 2022-11-26 NOTE — Progress Notes (Signed)
Patient ID: Debra Kirk, female   DOB: 03-06-1930, 89 y.o.   MRN: [id number]   Virtual Visit via video Note   All issues noted in this document were discussed and addressed.  No physical exam was performed (except for noted visual exam findings with Video Visits).   I connected with Debra Kirk by a video enabled telemedicine application and verified that I am speaking with the correct person using two identifiers. Location patient: home Location provider: work Persons participating in the virtual visit: patient, provider and pts daughter Debra Kirk.   The limitations, risks, security and privacy concerns of performing an evaluation and management service by video and the availability of in person appointments have been discussed.  It has also been discussed with the patient that there may be a patient responsible charge related to this service. The patient expressed understanding and agreed to proceed.   Reason for visit: work in appt  HPI: Work in - concerns regarding possible UTI.  Recently was evaluated - increased urinary frequency.   Urine - revealed UTI.  Treated.  Was better for a while.  Now reports return of symptoms.  Wetting herself - when gets up.  Worsened over the last couple of days.  No dysuria.  Late pm worse.  Has to sometimes strain to make urine.  No fever.  No vomiting.  No abdominal pain.  Eating.  Blood sugar 206 this am.    ROS: See pertinent positives and negatives per HPI.  Past Medical History:  Diagnosis Date   Carotid artery disease    Compression fracture    s/p fosamax, prolia   Diabetes mellitus    Femoral neuropathy    left, elevated CRP, ESR, negative FANA and ANCA   Fibrocystic breast disease    Hypercholesterolemia    Hypertension     Past Surgical History:  Procedure Laterality Date   APPENDECTOMY     CHOLECYSTECTOMY     HERNIA REPAIR     INGUINAL HERNIA REPAIR     left x 1, right x 2   OVARIAN CYST REMOVAL     PARTIAL HYSTERECTOMY       Family History  Problem Relation Age of Onset   Heart disease Father        myocardial infarction   Heart disease Mother        myocardial infarction   Diabetes Mother    Congestive Heart Failure Mother    Bone cancer Sister    Diabetes Sister        x2   Ovarian cancer Sister [id number]   Heart disease Sister        CABG   Rheum arthritis Sister    Breast cancer Neg Hx    Colon cancer Neg Hx     SOCIAL HX: reviewed.    Current Outpatient Medications:    AMBULATORY NON FORMULARY MEDICATION, Medication Name: Incentive Spirometry Use 10-15 times daily., Disp: 1 each, Rfl: 0   amLODipine (NORVASC) 5 MG tablet, Take 1 tablet (5 mg total) by mouth daily., Disp: 90 tablet, Rfl: 3   Calcium Carbonate-Vitamin D 600-400 MG-UNIT tablet, Take 1 tablet by mouth 2 (two) times daily., Disp: , Rfl:    CONTOUR TEST test strip, CHECK DAILY AND AS NEEDED **E11.9**, Disp: 50 strip, Rfl: 6   denosumab (PROLIA) 60 MG/ML SOLN injection, Inject 60 mg into the skin every 6 (six) months. Administer in upper arm, thigh, or abdomen, Disp: , Rfl:    dicyclomine (BENTYL) 10  MG capsule, Take 10 mg by mouth 3 (three) times daily before meals. , Disp: , Rfl:    glimepiride (AMARYL) 2 MG tablet, TAKE 1 TABLET BY MOUTH TWICE  DAILY, Disp: 180 tablet, Rfl: 3   JARDIANCE 25 MG TABS tablet, TAKE 1 TABLET BY MOUTH DAILY, Disp: 90 tablet, Rfl: 3   meclizine (ANTIVERT) 12.5 MG tablet, Take 1 tablet (12.5 mg total) by mouth 2 (two) times daily as needed for dizziness., Disp: 20 tablet, Rfl: 0   metoCLOPramide (REGLAN) 5 MG tablet, Take 5 mg by mouth 2 (two) times daily. , Disp: , Rfl:    omeprazole (PRILOSEC) 20 MG capsule, Take 1 capsule (20 mg total) by mouth 2 (two) times daily., Disp: 180 capsule, Rfl: 3   Respiratory Therapy Supplies (FLUTTER) DEVI, Use 10-15 times daily, Disp: 1 each, Rfl: 0   rosuvastatin (CRESTOR) 20 MG tablet, TAKE 1 TABLET BY MOUTH DAILY, Disp: 90 tablet, Rfl: 3  EXAM:  GENERAL: alert,  oriented, appears well and in no acute distress  HEENT: atraumatic, conjunttiva clear, no obvious abnormalities on inspection of external nose and ears  NECK: normal movements of the head and neck  LUNGS: on inspection no signs of respiratory distress, breathing rate appears normal, no obvious gross SOB, gasping or wheezing  CV: no obvious cyanosis  PSYCH/NEURO: pleasant and cooperative, no obvious depression or anxiety, speech and thought processing grossly intact  ASSESSMENT AND PLAN:  Discussed the following assessment and plan:  Problem List Items Addressed This Visit     Urinary frequency    Have discussed OAB and incontinence.  Will check to confirm if UTI present.  Will notify me if desires any further testing or intervention. Check urinalysis to confirm no infection.       Hypertension    Continue amlodipine. Follow pressures.  Follow metabolic panel.       Abnormal radionuclide bone scan - Primary    Longitudinal increased tracer uptake at distal RIGHT femoral diaphysis, nonspecific, recommend radiographic correlation to determine etiology. Xray femur - Linear calcific densities are noted over the distal femur in the region of previously identified bone scan linear increased uptake in the distal right femur. These could represent overlying vascular calcifications however a calcified bony lesion cannot be excluded. MRI of the right femur suggested for further evaluation. Discussed with radiology.  Recommended CT scan.  Discussed with Debra Kirk again today.  She wants to hold on further testing.        Return if symptoms worsen or fail to improve.   I discussed the assessment and treatment plan with the patient. The patient was provided an opportunity to ask questions and all were answered. The patient agreed with the plan and demonstrated an understanding of the instructions.   The patient was advised to call back or seek an in-person evaluation if the symptoms worsen or if  the condition fails to improve as anticipated.   Debra Durhamharlene Adalea Handler, MD

## 2022-11-26 NOTE — Telephone Encounter (Signed)
Daughter called, and patient thinks that her UTI from last month has not totally cleared up.

## 2022-11-26 NOTE — Telephone Encounter (Signed)
Urine ordered

## 2022-11-26 NOTE — Telephone Encounter (Signed)
Per chart, patient coming in to leave a urine sample today.

## 2022-11-27 LAB — URINE CULTURE
MICRO NUMBER:: 14794659
SPECIMEN QUALITY:: ADEQUATE

## 2022-12-02 ENCOUNTER — Encounter: Payer: Self-pay | Admitting: Internal Medicine

## 2022-12-02 NOTE — Assessment & Plan Note (Signed)
Have discussed OAB and incontinence.  Will check to confirm if UTI present.  Will notify me if desires any further testing or intervention. Check urinalysis to confirm no infection.

## 2022-12-02 NOTE — Assessment & Plan Note (Signed)
Longitudinal increased tracer uptake at distal RIGHT femoral diaphysis, nonspecific, recommend radiographic correlation to determine etiology. Xray femur - Linear calcific densities are noted over the distal femur in the region of previously identified bone scan linear increased uptake in the distal right femur. These could represent overlying vascular calcifications however a calcified bony lesion cannot be excluded. MRI of the right femur suggested for further evaluation. Discussed with radiology.  Recommended CT scan.  Discussed with Debra Kirk again today.  She wants to hold on further testing.

## 2022-12-02 NOTE — Assessment & Plan Note (Signed)
Continue amlodipine.  Follow pressures.  Follow metabolic panel.   

## 2022-12-18 DIAGNOSIS — H353221 Exudative age-related macular degeneration, left eye, with active choroidal neovascularization: Secondary | ICD-10-CM | POA: Diagnosis not present

## 2022-12-19 ENCOUNTER — Other Ambulatory Visit (INDEPENDENT_AMBULATORY_CARE_PROVIDER_SITE_OTHER): Payer: Medicare Other

## 2022-12-19 DIAGNOSIS — E1165 Type 2 diabetes mellitus with hyperglycemia: Secondary | ICD-10-CM | POA: Diagnosis not present

## 2022-12-19 DIAGNOSIS — M810 Age-related osteoporosis without current pathological fracture: Secondary | ICD-10-CM

## 2022-12-19 DIAGNOSIS — I10 Essential (primary) hypertension: Secondary | ICD-10-CM

## 2022-12-19 DIAGNOSIS — E7800 Pure hypercholesterolemia, unspecified: Secondary | ICD-10-CM

## 2022-12-19 LAB — LIPID PANEL
Cholesterol: 159 mg/dL (ref 0–200)
HDL: 55 mg/dL (ref >39.00)
LDL Cholesterol: 86 mg/dL (ref 0–99)
NonHDL: 103.61
Total CHOL/HDL Ratio: 3
Triglycerides: 88 mg/dL (ref 0.0–149.0)
VLDL: 17.6 mg/dL (ref 0.0–40.0)

## 2022-12-19 LAB — TSH: TSH: 1.04 u[IU]/mL (ref 0.35–5.50)

## 2022-12-19 LAB — CBC WITH DIFFERENTIAL/PLATELET
Basophils Absolute: 0.1 10*3/uL (ref 0.0–0.1)
Basophils Relative: 0.9 % (ref 0.0–3.0)
Eosinophils Absolute: 0 10*3/uL (ref 0.0–0.7)
Eosinophils Relative: 0 % (ref 0.0–5.0)
HCT: 43.5 % (ref 36.0–46.0)
Hemoglobin: 14.4 g/dL (ref 12.0–15.0)
Lymphocytes Relative: 41.3 % (ref 12.0–46.0)
Lymphs Abs: 2.4 10*3/uL (ref 0.7–4.0)
MCHC: 33.1 g/dL (ref 30.0–36.0)
MCV: 89.7 fl (ref 78.0–100.0)
Monocytes Absolute: 0.5 10*3/uL (ref 0.1–1.0)
Monocytes Relative: 8.7 % (ref 3.0–12.0)
Neutro Abs: 2.8 10*3/uL (ref 1.4–7.7)
Neutrophils Relative %: 49.1 % (ref 43.0–77.0)
Platelets: 173 10*3/uL (ref 150.0–400.0)
RBC: 4.85 Mil/uL (ref 3.87–5.11)
RDW: 14.8 % (ref 11.5–15.5)
WBC: 5.7 10*3/uL (ref 4.0–10.5)

## 2022-12-19 LAB — HEPATIC FUNCTION PANEL
ALT: 10 U/L (ref 0–35)
AST: 12 U/L (ref 0–37)
Albumin: 4.1 g/dL (ref 3.5–5.2)
Alkaline Phosphatase: 38 U/L — ABNORMAL LOW (ref 39–117)
Bilirubin, Direct: 0.2 mg/dL (ref 0.0–0.3)
Total Bilirubin: 0.8 mg/dL (ref 0.2–1.2)
Total Protein: 6.9 g/dL (ref 6.0–8.3)

## 2022-12-19 LAB — BASIC METABOLIC PANEL
BUN: 20 mg/dL (ref 6–23)
CO2: 23 mEq/L (ref 19–32)
Calcium: 9 mg/dL (ref 8.4–10.5)
Chloride: 104 mEq/L (ref 96–112)
Creatinine, Ser: 0.81 mg/dL (ref 0.40–1.20)
GFR: 63.59 mL/min (ref >60.00)
Glucose, Bld: 193 mg/dL — ABNORMAL HIGH (ref 70–99)
Potassium: 3.5 mEq/L (ref 3.5–5.1)
Sodium: 138 mEq/L (ref 135–145)

## 2022-12-19 LAB — VITAMIN D 25 HYDROXY (VIT D DEFICIENCY, FRACTURES): VITD: 59.48 ng/mL (ref 30.00–100.00)

## 2022-12-19 LAB — HEMOGLOBIN A1C: Hgb A1c MFr Bld: 8.1 % — ABNORMAL HIGH (ref 4.6–6.5)

## 2022-12-25 ENCOUNTER — Encounter: Payer: Self-pay | Admitting: Internal Medicine

## 2022-12-25 ENCOUNTER — Ambulatory Visit (INDEPENDENT_AMBULATORY_CARE_PROVIDER_SITE_OTHER): Payer: Medicare Other | Admitting: Internal Medicine

## 2022-12-25 VITALS — BP 132/70 | HR 79 | Temp 97.9°F | Resp 16 | Ht 66.0 in | Wt 145.0 lb

## 2022-12-25 DIAGNOSIS — K3184 Gastroparesis: Secondary | ICD-10-CM

## 2022-12-25 DIAGNOSIS — E1143 Type 2 diabetes mellitus with diabetic autonomic (poly)neuropathy: Secondary | ICD-10-CM

## 2022-12-25 DIAGNOSIS — I2510 Atherosclerotic heart disease of native coronary artery without angina pectoris: Secondary | ICD-10-CM | POA: Diagnosis not present

## 2022-12-25 DIAGNOSIS — I10 Essential (primary) hypertension: Secondary | ICD-10-CM | POA: Diagnosis not present

## 2022-12-25 DIAGNOSIS — I70213 Atherosclerosis of native arteries of extremities with intermittent claudication, bilateral legs: Secondary | ICD-10-CM | POA: Diagnosis not present

## 2022-12-25 DIAGNOSIS — I700 Atherosclerosis of aorta: Secondary | ICD-10-CM | POA: Diagnosis not present

## 2022-12-25 DIAGNOSIS — E7800 Pure hypercholesterolemia, unspecified: Secondary | ICD-10-CM

## 2022-12-25 DIAGNOSIS — M810 Age-related osteoporosis without current pathological fracture: Secondary | ICD-10-CM

## 2022-12-25 DIAGNOSIS — R300 Dysuria: Secondary | ICD-10-CM

## 2022-12-25 DIAGNOSIS — Z7984 Long term (current) use of oral hypoglycemic drugs: Secondary | ICD-10-CM

## 2022-12-25 DIAGNOSIS — E1165 Type 2 diabetes mellitus with hyperglycemia: Secondary | ICD-10-CM

## 2022-12-25 DIAGNOSIS — J479 Bronchiectasis, uncomplicated: Secondary | ICD-10-CM | POA: Diagnosis not present

## 2022-12-25 LAB — URINALYSIS, ROUTINE W REFLEX MICROSCOPIC
Bilirubin Urine: NEGATIVE
Hgb urine dipstick: NEGATIVE
Ketones, ur: NEGATIVE
Leukocytes,Ua: NEGATIVE
Nitrite: NEGATIVE
RBC / HPF: NONE SEEN (ref 0–?)
Specific Gravity, Urine: <=1.005 — AB (ref 1.000–1.030)
Total Protein, Urine: NEGATIVE
Urine Glucose: >=1000 — AB
Urobilinogen, UA: 0.2 (ref 0.0–1.0)
pH: 6 (ref 5.0–8.0)

## 2022-12-25 NOTE — Progress Notes (Signed)
Subjective:    Patient ID: Debra Kirk, female    DOB: Jan 16, 1930, 89 y.o.   MRN: [id number]  Patient here for  Chief Complaint  Patient presents with   Medical Management of Chronic Issues    HPI Here to follow up regarding hypercholesterolemia, diabetes and hypertension.  She is accompanied by her daughter Dois Davenport).  History obtained from both of them.  Tries to stay active.  No chest pain or sob reported.  No abdominal pain reported.  Taking benefiber.  Keeping bowels regular.  Some dysuria - noticed over the last few days.  The increased frequency has improved. Wants urine checked to confirm no infection.     Past Medical History:  Diagnosis Date   Carotid artery disease (HCC)    Compression fracture    s/p fosamax, prolia   Diabetes mellitus (HCC)    Femoral neuropathy    left, elevated CRP, ESR, negative FANA and ANCA   Fibrocystic breast disease    Hypercholesterolemia    Hypertension    Past Surgical History:  Procedure Laterality Date   APPENDECTOMY     CHOLECYSTECTOMY     HERNIA REPAIR     INGUINAL HERNIA REPAIR     left x 1, right x 2   OVARIAN CYST REMOVAL     PARTIAL HYSTERECTOMY     Family History  Problem Relation Age of Onset   Heart disease Father        myocardial infarction   Heart disease Mother        myocardial infarction   Diabetes Mother    Congestive Heart Failure Mother    Bone cancer Sister    Diabetes Sister        x2   Ovarian cancer Sister 108   Heart disease Sister        CABG   Rheum arthritis Sister    Breast cancer Neg Hx    Colon cancer Neg Hx    Social History   Socioeconomic History   Marital status: Widowed    Spouse name: Not on file   Number of children: 3   Years of education: Not on file   Highest education level: Not on file  Occupational History   Not on file  Tobacco Use   Smoking status: Former    Types: Cigarettes    Quit date: 08/20/1958    Years since quitting: 64.4   Smokeless tobacco: Never   Substance and Sexual Activity   Alcohol use: No    Alcohol/week: 0.0 standard drinks of alcohol   Drug use: No   Sexual activity: Never  Other Topics Concern   Not on file  Social History Narrative   Not on file   Social Determinants of Health   Financial Resource Strain: Not on file  Food Insecurity: Not on file  Transportation Needs: Not on file  Physical Activity: Not on file  Stress: Not on file  Social Connections: Not on file     Review of Systems  Constitutional:  Negative for appetite change and unexpected weight change.  HENT:  Negative for congestion and sinus pressure.   Respiratory:  Negative for cough, chest tightness and shortness of breath.   Cardiovascular:  Negative for chest pain, palpitations and leg swelling.  Gastrointestinal:  Negative for abdominal pain, diarrhea, nausea and vomiting.  Genitourinary:  Positive for dysuria.  Musculoskeletal:  Negative for joint swelling and myalgias.  Skin:  Negative for color change and rash.  Neurological:  Negative for dizziness and headaches.  Psychiatric/Behavioral:  Negative for agitation and dysphoric mood.        Objective:     BP 132/70   Pulse 79   Temp 97.9 F (36.6 C)   Resp 16   Ht 5\' 6"  (1.676 m)   Wt 145 lb (65.8 kg)   SpO2 97%   BMI 23.40 kg/m  Wt Readings from Last 3 Encounters:  12/25/22 145 lb (65.8 kg)  11/26/22 147 lb (66.7 kg)  10/04/22 147 lb (66.7 kg)    Physical Exam Vitals reviewed.  Constitutional:      General: She is not in acute distress.    Appearance: Normal appearance.  HENT:     Head: Normocephalic and atraumatic.     Right Ear: External ear normal.     Left Ear: External ear normal.  Eyes:     General: No scleral icterus.       Right eye: No discharge.        Left eye: No discharge.     Conjunctiva/sclera: Conjunctivae normal.  Neck:     Thyroid: No thyromegaly.  Cardiovascular:     Rate and Rhythm: Normal rate and regular rhythm.  Pulmonary:      Effort: No respiratory distress.     Breath sounds: Normal breath sounds. No wheezing.  Abdominal:     General: Bowel sounds are normal.     Palpations: Abdomen is soft.     Tenderness: There is no abdominal tenderness.  Musculoskeletal:        General: No swelling or tenderness.     Cervical back: Neck supple. No tenderness.  Lymphadenopathy:     Cervical: No cervical adenopathy.  Skin:    Findings: No erythema or rash.  Neurological:     Mental Status: She is alert.  Psychiatric:        Mood and Affect: Mood normal.        Behavior: Behavior normal.      Outpatient Encounter Medications as of 12/25/2022  Medication Sig   AMBULATORY NON FORMULARY MEDICATION Medication Name: Incentive Spirometry Use 10-15 times daily.   amLODipine (NORVASC) 5 MG tablet Take 1 tablet (5 mg total) by mouth daily.   Calcium Carbonate-Vitamin D 600-400 MG-UNIT tablet Take 1 tablet by mouth 2 (two) times daily.   CONTOUR TEST test strip CHECK DAILY AND AS NEEDED **E11.9**   denosumab (PROLIA) 60 MG/ML SOLN injection Inject 60 mg into the skin every 6 (six) months. Administer in upper arm, thigh, or abdomen   dicyclomine (BENTYL) 10 MG capsule Take 10 mg by mouth 3 (three) times daily before meals.    glimepiride (AMARYL) 2 MG tablet TAKE 1 TABLET BY MOUTH TWICE  DAILY   JARDIANCE 25 MG TABS tablet TAKE 1 TABLET BY MOUTH DAILY   meclizine (ANTIVERT) 12.5 MG tablet Take 1 tablet (12.5 mg total) by mouth 2 (two) times daily as needed for dizziness.   metoCLOPramide (REGLAN) 5 MG tablet Take 5 mg by mouth 2 (two) times daily.    omeprazole (PRILOSEC) 20 MG capsule Take 1 capsule (20 mg total) by mouth 2 (two) times daily.   Respiratory Therapy Supplies (FLUTTER) DEVI Use 10-15 times daily   rosuvastatin (CRESTOR) 20 MG tablet TAKE 1 TABLET BY MOUTH DAILY   No facility-administered encounter medications on file as of 12/25/2022.     Lab Results  Component Value Date   WBC 5.7 12/19/2022   HGB 14.4  12/19/2022   HCT  43.5 12/19/2022   PLT 173.0 12/19/2022   GLUCOSE 193 (H) 12/19/2022   CHOL 159 12/19/2022   TRIG 88.0 12/19/2022   HDL 55.00 12/19/2022   LDLCALC 86 12/19/2022   ALT 10 12/19/2022   AST 12 12/19/2022   NA 138 12/19/2022   K 3.5 12/19/2022   CL 104 12/19/2022   CREATININE 0.81 12/19/2022   BUN 20 12/19/2022   CO2 23 12/19/2022   TSH 1.04 12/19/2022   HGBA1C 8.1 (H) 12/19/2022   MICROALBUR 6.2 (H) 12/20/2021    NM Bone Scan Whole Body  Result Date: 09/20/2022 CLINICAL DATA:  Incidental bone lesion RIGHT femur at femoral head EXAM: NUCLEAR MEDICINE WHOLE BODY BONE SCAN TECHNIQUE: Whole body anterior and posterior images were obtained approximately 3 hours after intravenous injection of radiopharmaceutical. RADIOPHARMACEUTICALS:  23.36 mCi Technetium-108m MDP IV COMPARISON:  None available Correlation: RIGHT hip and lumbar spine radiographs of 09/05/2022 FINDINGS: Foci of abnormal tracer uptake located at multiple sites in the lumbar spine predominantly along the RIGHT lateral border of the levoconvex scoliosis. These likely reflect degenerative disc and facet disease changes seen on recent radiographs. Additional focal uptake at LEFT L5-S1 corresponding to degenerative changes and sclerosis on radiographs. Additional uptake at cervical spine shoulders, wrists, sternoclavicular joints, hips, appearance typically degenerative. A longitudinal focus of increased uptake is seen at the distal RIGHT femoral diaphysis, nonspecific. No additional worrisome sites of abnormal tracer accumulation. Expected urinary tract and soft tissue distribution of tracer. IMPRESSION: Multiple sites of abnormal tracer uptake at thoracic and lumbar spine and in multiple joints, typically degenerative, with uptake in the thoracolumbar spine corresponding to advanced degenerative changes on radiographs. Longitudinal increased tracer uptake at distal RIGHT femoral diaphysis, nonspecific, recommend radiographic  correlation to determine etiology. No abnormal focal tracer uptake at the RIGHT femoral head to suggest metastatic disease. Electronically Signed   By: Ulyses Southward M.D.   On: 09/20/2022 08:39       Assessment & Plan:  Dysuria Assessment & Plan: Check urine to confirm no infection.  Frequency is better.  Noticed dysuria over the last few days.    Orders: -     Urinalysis, Routine w reflex microscopic -     Urine Culture  Type 2 diabetes mellitus with hyperglycemia, without long-term current use of insulin (HCC) Assessment & Plan: Low carb diet and exercise.  Continues on jardiance and amaryl.  Follow met b and a1c.  ecent A1c 8.1. Given 91 - hold on making changes.   Discussed diet and exercise.    Orders: -     Hemoglobin A1c; Future  Hypercholesterolemia Assessment & Plan: Continue crestor.  Low cholesterol diet and exercise.  Follow lipid panel and liver function tests.    Orders: -     Lipid panel; Future -     Hepatic function panel; Future -     Basic metabolic panel; Future  Aortic atherosclerosis (HCC) Assessment & Plan: Continue crestor.    Atherosclerosis of native artery of both lower extremities with intermittent claudication [facility]) Assessment & Plan: Has been evaluated by Dr Gilda Crease.  Continue crestor.  Stable.    Bronchiectasis without complication (HCC) Assessment & Plan: Breathing stable.    Coronary artery disease involving native coronary artery of native heart without angina pectoris Assessment & Plan: No chest pain.  Continue risk factor modification.    Gastroparesis due to DM [facility]) Assessment & Plan: Followed by GI. On reglan. Taking benefiber.  Keeping bowels regular.     Primary  hypertension Assessment & Plan: Continue amlodipine. Follow pressures.  Follow metabolic panel.    Osteoporosis without current pathological fracture, unspecified osteoporosis type Assessment & Plan: Receiving prolia. Last 11/06/22      Dale ,  MD

## 2022-12-27 ENCOUNTER — Other Ambulatory Visit: Payer: Self-pay

## 2022-12-27 LAB — URINE CULTURE
MICRO NUMBER:: 14923914
SPECIMEN QUALITY:: ADEQUATE

## 2022-12-27 MED ORDER — NITROFURANTOIN MONOHYD MACRO 100 MG PO CAPS: 100.0000 mg | ORAL_CAPSULE | Freq: Two times a day (BID) | ORAL | 0 refills | Status: DC

## 2023-01-13 ENCOUNTER — Encounter: Payer: Self-pay | Admitting: Internal Medicine

## 2023-01-13 NOTE — Assessment & Plan Note (Signed)
Continue crestor 

## 2023-01-13 NOTE — Assessment & Plan Note (Signed)
Receiving prolia. Last 11/06/22

## 2023-01-13 NOTE — Assessment & Plan Note (Signed)
No chest pain.  Continue risk factor modification.  

## 2023-01-13 NOTE — Assessment & Plan Note (Signed)
Check urine to confirm no infection.  Frequency is better.  Noticed dysuria over the last few days.

## 2023-01-13 NOTE — Assessment & Plan Note (Signed)
Continue crestor.  Low cholesterol diet and exercise.  Follow lipid panel and liver function tests.  

## 2023-01-13 NOTE — Assessment & Plan Note (Signed)
Has been evaluated by Dr Schnier.  Continue crestor.  Stable.  

## 2023-01-13 NOTE — Assessment & Plan Note (Signed)
Continue amlodipine.  Follow pressures.  Follow metabolic panel.   

## 2023-01-13 NOTE — Assessment & Plan Note (Signed)
Breathing stable.

## 2023-01-13 NOTE — Assessment & Plan Note (Signed)
Followed by GI. On reglan. Taking benefiber.  Keeping bowels regular.

## 2023-01-13 NOTE — Assessment & Plan Note (Signed)
Low carb diet and exercise.  Continues on jardiance and amaryl.  Follow met b and a1c.  ecent A1c 8.1. Given 91 - hold on making changes.   Discussed diet and exercise.

## 2023-03-12 DIAGNOSIS — E119 Type 2 diabetes mellitus without complications: Secondary | ICD-10-CM | POA: Diagnosis not present

## 2023-03-12 DIAGNOSIS — H353221 Exudative age-related macular degeneration, left eye, with active choroidal neovascularization: Secondary | ICD-10-CM | POA: Diagnosis not present

## 2023-03-12 DIAGNOSIS — H353211 Exudative age-related macular degeneration, right eye, with active choroidal neovascularization: Secondary | ICD-10-CM | POA: Diagnosis not present

## 2023-03-12 DIAGNOSIS — Z961 Presence of intraocular lens: Secondary | ICD-10-CM | POA: Diagnosis not present

## 2023-04-04 ENCOUNTER — Encounter (INDEPENDENT_AMBULATORY_CARE_PROVIDER_SITE_OTHER): Payer: Self-pay

## 2023-04-17 ENCOUNTER — Other Ambulatory Visit: Payer: Self-pay | Admitting: Internal Medicine

## 2023-04-24 ENCOUNTER — Other Ambulatory Visit (INDEPENDENT_AMBULATORY_CARE_PROVIDER_SITE_OTHER): Payer: Medicare Other

## 2023-04-24 DIAGNOSIS — E1165 Type 2 diabetes mellitus with hyperglycemia: Secondary | ICD-10-CM

## 2023-04-24 DIAGNOSIS — E7800 Pure hypercholesterolemia, unspecified: Secondary | ICD-10-CM

## 2023-04-24 LAB — LIPID PANEL
Cholesterol: 168 mg/dL (ref 0–200)
HDL: 52.8 mg/dL (ref >39.00)
LDL Cholesterol: 96 mg/dL (ref 0–99)
NonHDL: 115.69
Total CHOL/HDL Ratio: 3
Triglycerides: 99 mg/dL (ref 0.0–149.0)
VLDL: 19.8 mg/dL (ref 0.0–40.0)

## 2023-04-24 LAB — HEPATIC FUNCTION PANEL
ALT: 11 U/L (ref 0–35)
AST: 12 U/L (ref 0–37)
Albumin: 4.1 g/dL (ref 3.5–5.2)
Alkaline Phosphatase: 39 U/L (ref 39–117)
Bilirubin, Direct: 0.2 mg/dL (ref 0.0–0.3)
Total Bilirubin: 0.8 mg/dL (ref 0.2–1.2)
Total Protein: 7.3 g/dL (ref 6.0–8.3)

## 2023-04-24 LAB — BASIC METABOLIC PANEL
BUN: 18 mg/dL (ref 6–23)
CO2: 26 meq/L (ref 19–32)
Calcium: 9.6 mg/dL (ref 8.4–10.5)
Chloride: 101 meq/L (ref 96–112)
Creatinine, Ser: 0.83 mg/dL (ref 0.40–1.20)
GFR: 61.6 mL/min (ref >60.00)
Glucose, Bld: 210 mg/dL — ABNORMAL HIGH (ref 70–99)
Potassium: 3.6 meq/L (ref 3.5–5.1)
Sodium: 138 meq/L (ref 135–145)

## 2023-04-24 LAB — HEMOGLOBIN A1C: Hgb A1c MFr Bld: 8 % — ABNORMAL HIGH (ref 4.6–6.5)

## 2023-04-30 ENCOUNTER — Ambulatory Visit (INDEPENDENT_AMBULATORY_CARE_PROVIDER_SITE_OTHER): Payer: Medicare Other | Admitting: Internal Medicine

## 2023-04-30 ENCOUNTER — Encounter: Payer: Self-pay | Admitting: Internal Medicine

## 2023-04-30 VITALS — BP 132/72 | HR 71 | Temp 97.7°F | Ht 66.0 in | Wt 145.4 lb

## 2023-04-30 DIAGNOSIS — E7800 Pure hypercholesterolemia, unspecified: Secondary | ICD-10-CM

## 2023-04-30 DIAGNOSIS — I2510 Atherosclerotic heart disease of native coronary artery without angina pectoris: Secondary | ICD-10-CM | POA: Diagnosis not present

## 2023-04-30 DIAGNOSIS — I700 Atherosclerosis of aorta: Secondary | ICD-10-CM | POA: Diagnosis not present

## 2023-04-30 DIAGNOSIS — I70213 Atherosclerosis of native arteries of extremities with intermittent claudication, bilateral legs: Secondary | ICD-10-CM

## 2023-04-30 DIAGNOSIS — E1165 Type 2 diabetes mellitus with hyperglycemia: Secondary | ICD-10-CM

## 2023-04-30 DIAGNOSIS — J479 Bronchiectasis, uncomplicated: Secondary | ICD-10-CM

## 2023-04-30 DIAGNOSIS — K3184 Gastroparesis: Secondary | ICD-10-CM | POA: Diagnosis not present

## 2023-04-30 DIAGNOSIS — Z23 Encounter for immunization: Secondary | ICD-10-CM

## 2023-04-30 DIAGNOSIS — G6289 Other specified polyneuropathies: Secondary | ICD-10-CM | POA: Diagnosis not present

## 2023-04-30 DIAGNOSIS — E1143 Type 2 diabetes mellitus with diabetic autonomic (poly)neuropathy: Secondary | ICD-10-CM | POA: Diagnosis not present

## 2023-04-30 DIAGNOSIS — R948 Abnormal results of function studies of other organs and systems: Secondary | ICD-10-CM

## 2023-04-30 DIAGNOSIS — I10 Essential (primary) hypertension: Secondary | ICD-10-CM

## 2023-04-30 DIAGNOSIS — M810 Age-related osteoporosis without current pathological fracture: Secondary | ICD-10-CM

## 2023-04-30 NOTE — Progress Notes (Signed)
Subjective:    Patient ID: Debra Kirk, female    DOB: 1930/06/03, 89 y.o.   MRN: [id number]  Patient here for  Chief Complaint  Patient presents with   Medical Management of Chronic Issues    HPI Here to follow up regarding hypercholesterolemia, diabetes and hypertension.  She is accompanied by her daughter Dois Davenport).  History obtained from both of them.  She reports she is doing relatively well.  Tries to stay active.  No chest pain or sob reported.  No cough or congestion.  No abdominal pain or bowel change reported.  Had questions about previous xray and bone scan.  Discussed.  Discussed f/u CT scan.  Desires to hold at this time.  No leg pain.     Past Medical History:  Diagnosis Date   Carotid artery disease (HCC)    Compression fracture    s/p fosamax, prolia   Diabetes mellitus (HCC)    Femoral neuropathy    left, elevated CRP, ESR, negative FANA and ANCA   Fibrocystic breast disease    Hypercholesterolemia    Hypertension    Past Surgical History:  Procedure Laterality Date   APPENDECTOMY     CHOLECYSTECTOMY     HERNIA REPAIR     INGUINAL HERNIA REPAIR     left x 1, right x 2   OVARIAN CYST REMOVAL     PARTIAL HYSTERECTOMY     Family History  Problem Relation Age of Onset   Heart disease Father        myocardial infarction   Heart disease Mother        myocardial infarction   Diabetes Mother    Congestive Heart Failure Mother    Bone cancer Sister    Diabetes Sister        x2   Ovarian cancer Sister 58   Heart disease Sister        CABG   Rheum arthritis Sister    Breast cancer Neg Hx    Colon cancer Neg Hx    Social History   Socioeconomic History   Marital status: Widowed    Spouse name: Not on file   Number of children: 3   Years of education: Not on file   Highest education level: Not on file  Occupational History   Not on file  Tobacco Use   Smoking status: Former    Current packs/day: 0.00    Types: Cigarettes    Quit date:  08/20/1958    Years since quitting: 64.7   Smokeless tobacco: Never  Substance and Sexual Activity   Alcohol use: No    Alcohol/week: 0.0 standard drinks of alcohol   Drug use: No   Sexual activity: Never  Other Topics Concern   Not on file  Social History Narrative   Not on file   Social Determinants of Health   Financial Resource Strain: Not on file  Food Insecurity: Not on file  Transportation Needs: Not on file  Physical Activity: Not on file  Stress: Not on file  Social Connections: Not on file     Review of Systems  Constitutional:  Negative for appetite change and unexpected weight change.  HENT:  Negative for congestion and sinus pressure.   Respiratory:  Negative for cough, chest tightness and shortness of breath.   Cardiovascular:  Negative for chest pain, palpitations and leg swelling.  Gastrointestinal:  Negative for abdominal pain, diarrhea, nausea and vomiting.  Genitourinary:  Negative for difficulty urinating and  dysuria.  Musculoskeletal:  Negative for joint swelling and myalgias.  Skin:  Negative for color change and rash.  Neurological:  Negative for dizziness and headaches.  Psychiatric/Behavioral:  Negative for agitation and dysphoric mood.        Objective:     BP 132/72   Pulse 71   Temp 97.7 F (36.5 C) (Oral)   Ht 5\' 6"  (1.676 m)   Wt 145 lb 6.4 oz (66 kg)   SpO2 99%   BMI 23.47 kg/m  Wt Readings from Last 3 Encounters:  04/30/23 145 lb 6.4 oz (66 kg)  12/25/22 145 lb (65.8 kg)  11/26/22 147 lb (66.7 kg)    Physical Exam Vitals reviewed.  Constitutional:      General: She is not in acute distress.    Appearance: Normal appearance.  HENT:     Head: Normocephalic and atraumatic.     Right Ear: External ear normal.     Left Ear: External ear normal.  Eyes:     General: No scleral icterus.       Right eye: No discharge.        Left eye: No discharge.     Conjunctiva/sclera: Conjunctivae normal.  Neck:     Thyroid: No  thyromegaly.  Cardiovascular:     Rate and Rhythm: Normal rate and regular rhythm.  Pulmonary:     Effort: No respiratory distress.     Breath sounds: Normal breath sounds. No wheezing.  Abdominal:     General: Bowel sounds are normal.     Palpations: Abdomen is soft.     Tenderness: There is no abdominal tenderness.  Musculoskeletal:        General: No swelling or tenderness.     Cervical back: Neck supple. No tenderness.  Lymphadenopathy:     Cervical: No cervical adenopathy.  Skin:    Findings: No erythema or rash.  Neurological:     Mental Status: She is alert.  Psychiatric:        Mood and Affect: Mood normal.        Behavior: Behavior normal.      Outpatient Encounter Medications as of 04/30/2023  Medication Sig   nitrofurantoin, macrocrystal-monohydrate, (MACROBID) 100 MG capsule Take 1 capsule (100 mg total) by mouth 2 (two) times daily.   AMBULATORY NON FORMULARY MEDICATION Medication Name: Incentive Spirometry Use 10-15 times daily.   amLODipine (NORVASC) 5 MG tablet TAKE 1 TABLET BY MOUTH DAILY   Calcium Carbonate-Vitamin D 600-400 MG-UNIT tablet Take 1 tablet by mouth 2 (two) times daily.   CONTOUR TEST test strip CHECK DAILY AND AS NEEDED **E11.9**   denosumab (PROLIA) 60 MG/ML SOLN injection Inject 60 mg into the skin every 6 (six) months. Administer in upper arm, thigh, or abdomen   dicyclomine (BENTYL) 10 MG capsule Take 10 mg by mouth 3 (three) times daily before meals.    glimepiride (AMARYL) 2 MG tablet TAKE 1 TABLET BY MOUTH TWICE  DAILY   JARDIANCE 25 MG TABS tablet TAKE 1 TABLET BY MOUTH DAILY   meclizine (ANTIVERT) 12.5 MG tablet Take 1 tablet (12.5 mg total) by mouth 2 (two) times daily as needed for dizziness.   metoCLOPramide (REGLAN) 5 MG tablet Take 5 mg by mouth 2 (two) times daily.    omeprazole (PRILOSEC) 20 MG capsule Take 1 capsule (20 mg total) by mouth 2 (two) times daily.   Respiratory Therapy Supplies (FLUTTER) DEVI Use 10-15 times daily    rosuvastatin (CRESTOR) 20 MG tablet  TAKE 1 TABLET BY MOUTH DAILY   No facility-administered encounter medications on file as of 04/30/2023.     Lab Results  Component Value Date   WBC 5.7 12/19/2022   HGB 14.4 12/19/2022   HCT 43.5 12/19/2022   PLT 173.0 12/19/2022   GLUCOSE 210 (H) 04/24/2023   CHOL 168 04/24/2023   TRIG 99.0 04/24/2023   HDL 52.80 04/24/2023   LDLCALC 96 04/24/2023   ALT 11 04/24/2023   AST 12 04/24/2023   NA 138 04/24/2023   K 3.6 04/24/2023   CL 101 04/24/2023   CREATININE 0.83 04/24/2023   BUN 18 04/24/2023   CO2 26 04/24/2023   TSH 1.04 12/19/2022   HGBA1C 8.0 (H) 04/24/2023   MICROALBUR 6.2 (H) 12/20/2021    NM Bone Scan Whole Body  Result Date: 09/20/2022 CLINICAL DATA:  Incidental bone lesion RIGHT femur at femoral head EXAM: NUCLEAR MEDICINE WHOLE BODY BONE SCAN TECHNIQUE: Whole body anterior and posterior images were obtained approximately 3 hours after intravenous injection of radiopharmaceutical. RADIOPHARMACEUTICALS:  23.36 mCi Technetium-31m MDP IV COMPARISON:  None available Correlation: RIGHT hip and lumbar spine radiographs of 09/05/2022 FINDINGS: Foci of abnormal tracer uptake located at multiple sites in the lumbar spine predominantly along the RIGHT lateral border of the levoconvex scoliosis. These likely reflect degenerative disc and facet disease changes seen on recent radiographs. Additional focal uptake at LEFT L5-S1 corresponding to degenerative changes and sclerosis on radiographs. Additional uptake at cervical spine shoulders, wrists, sternoclavicular joints, hips, appearance typically degenerative. A longitudinal focus of increased uptake is seen at the distal RIGHT femoral diaphysis, nonspecific. No additional worrisome sites of abnormal tracer accumulation. Expected urinary tract and soft tissue distribution of tracer. IMPRESSION: Multiple sites of abnormal tracer uptake at thoracic and lumbar spine and in multiple joints, typically  degenerative, with uptake in the thoracolumbar spine corresponding to advanced degenerative changes on radiographs. Longitudinal increased tracer uptake at distal RIGHT femoral diaphysis, nonspecific, recommend radiographic correlation to determine etiology. No abnormal focal tracer uptake at the RIGHT femoral head to suggest metastatic disease. Electronically Signed   By: Ulyses Southward M.D.   On: 09/20/2022 08:39       Assessment & Plan:  Encounter for immunization -     Flu Vaccine Trivalent High Dose (Fluad)  Type 2 diabetes mellitus with hyperglycemia, without long-term current use of insulin (HCC) Assessment & Plan: Low carb diet and exercise.  Continues on jardiance and amaryl.  Follow met b and a1c.  Recent A1c 8.0. Given 91 - hold on making changes.   Discussed diet and exercise.     Other polyneuropathy Assessment & Plan: Stable. Desires no medication.  Discussed.    Osteoporosis without current pathological fracture, unspecified osteoporosis type Assessment & Plan: Receiving prolia. Last 11/06/22   Primary hypertension Assessment & Plan: Continue amlodipine. Follow pressures.  Follow metabolic panel.    Hypercholesterolemia Assessment & Plan: Continue crestor.  Low cholesterol diet and exercise.  Follow lipid panel and liver function tests.     Gastroparesis due to DM [facility]) Assessment & Plan: Followed by GI. On reglan. Taking benefiber.  Keeping bowels regular. Reports is overall improved.    Coronary artery disease involving native coronary artery of native heart without angina pectoris Assessment & Plan: No chest pain.  Continue risk factor modification.    Bronchiectasis without complication (HCC) Assessment & Plan: Breathing stable.    Atherosclerosis of native artery of both lower extremities with intermittent claudication [facility]) Assessment & Plan: Has  been evaluated by Dr Gilda Crease.  Continue crestor.  Stable.    Aortic atherosclerosis (HCC) Assessment  & Plan: Continue crestor.    Abnormal radionuclide bone scan Assessment & Plan: Longitudinal increased tracer uptake at distal RIGHT femoral diaphysis, nonspecific, recommend radiographic correlation to determine etiology. Xray femur - Linear calcific densities are noted over the distal femur in the region of previously identified bone scan linear increased uptake in the distal right femur. These could represent overlying vascular calcifications however a calcified bony lesion cannot be excluded. MRI of the right femur suggested for further evaluation. Discussed with radiology.  Recommended CT scan.  Discussed with Ms Kelsall again today.  She wants to hold on further testing.       Dale Angleton, MD

## 2023-05-01 LAB — HM DIABETES FOOT EXAM

## 2023-05-05 ENCOUNTER — Encounter: Payer: Self-pay | Admitting: Internal Medicine

## 2023-05-05 NOTE — Assessment & Plan Note (Signed)
Continue crestor.  Low cholesterol diet and exercise.  Follow lipid panel and liver function tests.  

## 2023-05-05 NOTE — Assessment & Plan Note (Signed)
Continue crestor 

## 2023-05-05 NOTE — Assessment & Plan Note (Signed)
Stable. Desires no medication.  Discussed.

## 2023-05-05 NOTE — Assessment & Plan Note (Signed)
Receiving prolia. Last 11/06/22

## 2023-05-05 NOTE — Assessment & Plan Note (Signed)
Has been evaluated by Dr Gilda Crease.  Continue crestor.  Stable.

## 2023-05-05 NOTE — Assessment & Plan Note (Signed)
Breathing stable.

## 2023-05-05 NOTE — Assessment & Plan Note (Addendum)
Followed by GI. On reglan. Taking benefiber.  Keeping bowels regular. Reports is overall improved.

## 2023-05-05 NOTE — Assessment & Plan Note (Signed)
Continue amlodipine.  Follow pressures.  Follow metabolic panel.

## 2023-05-05 NOTE — Assessment & Plan Note (Signed)
No chest pain.  Continue risk factor modification.

## 2023-05-05 NOTE — Assessment & Plan Note (Signed)
Low carb diet and exercise.  Continues on jardiance and amaryl.  Follow met b and a1c.  Recent A1c 8.0. Given 91 - hold on making changes.   Discussed diet and exercise.

## 2023-05-05 NOTE — Assessment & Plan Note (Signed)
Longitudinal increased tracer uptake at distal RIGHT femoral diaphysis, nonspecific, recommend radiographic correlation to determine etiology. Xray femur - Linear calcific densities are noted over the distal femur in the region of previously identified bone scan linear increased uptake in the distal right femur. These could represent overlying vascular calcifications however a calcified bony lesion cannot be excluded. MRI of the right femur suggested for further evaluation. Discussed with radiology.  Recommended CT scan.  Discussed with Debra Kirk again today.  She wants to hold on further testing.

## 2023-05-10 ENCOUNTER — Telehealth: Payer: Self-pay | Admitting: Internal Medicine

## 2023-05-10 NOTE — Telephone Encounter (Signed)
Patient just called and wanted to know when can she get her next Prolia injection. Her last one she had was on 11-06-2022. She would like to know when she can get her next one. Her number is [phone]

## 2023-05-13 DIAGNOSIS — H353221 Exudative age-related macular degeneration, left eye, with active choroidal neovascularization: Secondary | ICD-10-CM | POA: Diagnosis not present

## 2023-05-14 NOTE — Telephone Encounter (Signed)
$  0 due, PA not required.   Tried to reach patient, line busy.  Sent mychart message

## 2023-05-15 ENCOUNTER — Ambulatory Visit: Payer: Medicare Other

## 2023-05-15 DIAGNOSIS — M810 Age-related osteoporosis without current pathological fracture: Secondary | ICD-10-CM

## 2023-05-15 MED ORDER — DENOSUMAB 60 MG/ML ~~LOC~~ SOSY
60.0000 mg | PREFILLED_SYRINGE | Freq: Once | SUBCUTANEOUS | Status: AC
Start: 2023-05-15 — End: 2023-05-15
  Administered 2023-05-15: 60 mg via SUBCUTANEOUS

## 2023-05-15 NOTE — Progress Notes (Signed)
Patient presents for prolia injection. Given subcutaneous in L arm. Tolerated well. No questions or concerns. No acute distress noted.

## 2023-07-18 ENCOUNTER — Other Ambulatory Visit: Payer: Self-pay | Admitting: Internal Medicine

## 2023-07-23 DIAGNOSIS — H353221 Exudative age-related macular degeneration, left eye, with active choroidal neovascularization: Secondary | ICD-10-CM | POA: Diagnosis not present

## 2023-08-28 ENCOUNTER — Telehealth: Payer: Self-pay | Admitting: Internal Medicine

## 2023-08-28 DIAGNOSIS — E1165 Type 2 diabetes mellitus with hyperglycemia: Secondary | ICD-10-CM

## 2023-08-28 DIAGNOSIS — E7800 Pure hypercholesterolemia, unspecified: Secondary | ICD-10-CM

## 2023-08-28 NOTE — Telephone Encounter (Signed)
 LABS ORDERED.

## 2023-09-11 ENCOUNTER — Other Ambulatory Visit: Payer: Medicare Other

## 2023-09-16 ENCOUNTER — Encounter: Payer: Self-pay | Admitting: Internal Medicine

## 2023-09-16 ENCOUNTER — Ambulatory Visit (INDEPENDENT_AMBULATORY_CARE_PROVIDER_SITE_OTHER): Payer: 59 | Admitting: Internal Medicine

## 2023-09-16 VITALS — BP 122/70 | HR 76 | Temp 98.0°F | Resp 16 | Ht 63.0 in | Wt 143.4 lb

## 2023-09-16 DIAGNOSIS — K3184 Gastroparesis: Secondary | ICD-10-CM | POA: Diagnosis not present

## 2023-09-16 DIAGNOSIS — J479 Bronchiectasis, uncomplicated: Secondary | ICD-10-CM

## 2023-09-16 DIAGNOSIS — E7800 Pure hypercholesterolemia, unspecified: Secondary | ICD-10-CM

## 2023-09-16 DIAGNOSIS — I2510 Atherosclerotic heart disease of native coronary artery without angina pectoris: Secondary | ICD-10-CM | POA: Diagnosis not present

## 2023-09-16 DIAGNOSIS — R350 Frequency of micturition: Secondary | ICD-10-CM | POA: Diagnosis not present

## 2023-09-16 DIAGNOSIS — I10 Essential (primary) hypertension: Secondary | ICD-10-CM

## 2023-09-16 DIAGNOSIS — M810 Age-related osteoporosis without current pathological fracture: Secondary | ICD-10-CM

## 2023-09-16 DIAGNOSIS — E1165 Type 2 diabetes mellitus with hyperglycemia: Secondary | ICD-10-CM

## 2023-09-16 DIAGNOSIS — Z7984 Long term (current) use of oral hypoglycemic drugs: Secondary | ICD-10-CM

## 2023-09-16 DIAGNOSIS — Z0000 Encounter for general adult medical examination without abnormal findings: Secondary | ICD-10-CM

## 2023-09-16 DIAGNOSIS — E1143 Type 2 diabetes mellitus with diabetic autonomic (poly)neuropathy: Secondary | ICD-10-CM | POA: Diagnosis not present

## 2023-09-16 DIAGNOSIS — I700 Atherosclerosis of aorta: Secondary | ICD-10-CM

## 2023-09-16 LAB — BASIC METABOLIC PANEL
BUN: 17 mg/dL (ref 6–23)
CO2: 27 meq/L (ref 19–32)
Calcium: 9.3 mg/dL (ref 8.4–10.5)
Chloride: 102 meq/L (ref 96–112)
Creatinine, Ser: 0.84 mg/dL (ref 0.40–1.20)
GFR: 60.56 mL/min (ref >60.00)
Glucose, Bld: 202 mg/dL — ABNORMAL HIGH (ref 70–99)
Potassium: 4 meq/L (ref 3.5–5.1)
Sodium: 137 meq/L (ref 135–145)

## 2023-09-16 LAB — HEPATIC FUNCTION PANEL
ALT: 11 U/L (ref 0–35)
AST: 13 U/L (ref 0–37)
Albumin: 4.2 g/dL (ref 3.5–5.2)
Alkaline Phosphatase: 36 U/L — ABNORMAL LOW (ref 39–117)
Bilirubin, Direct: 0.2 mg/dL (ref 0.0–0.3)
Total Bilirubin: 0.6 mg/dL (ref 0.2–1.2)
Total Protein: 7 g/dL (ref 6.0–8.3)

## 2023-09-16 LAB — LIPID PANEL
Cholesterol: 158 mg/dL (ref 0–200)
HDL: 59.6 mg/dL (ref >39.00)
LDL Cholesterol: 80 mg/dL (ref 0–99)
NonHDL: 98.89
Total CHOL/HDL Ratio: 3
Triglycerides: 95 mg/dL (ref 0.0–149.0)
VLDL: 19 mg/dL (ref 0.0–40.0)

## 2023-09-16 LAB — HEMOGLOBIN A1C: Hgb A1c MFr Bld: 8.3 % — ABNORMAL HIGH (ref 4.6–6.5)

## 2023-09-16 LAB — HM DIABETES FOOT EXAM

## 2023-09-16 MED ORDER — EMPAGLIFLOZIN 25 MG PO TABS
25.0000 mg | ORAL_TABLET | Freq: Every day | ORAL | 3 refills | Status: DC
  Filled 2023-11-18: qty 90, 90d supply, fill #0
  Filled 2024-02-18: qty 90, 90d supply, fill #1
  Filled 2024-05-21: qty 90, 90d supply, fill #2

## 2023-09-16 NOTE — Assessment & Plan Note (Addendum)
Physical to be performed next visit.  Mammogram 06/12/17 - Birads II.  Declines further mammograms.  Colonoscopy 02/2005.  89 years old now.  Hold colon screening.

## 2023-09-16 NOTE — Assessment & Plan Note (Signed)
Low carb diet and exercise.  Continues on jardiance and amaryl.  Follow met b and a1c.   Discussed diet and exercise.

## 2023-09-16 NOTE — Assessment & Plan Note (Addendum)
Followed by GI. On reglan. Taking benefiber.  Keeping bowels regular. Reports is overall stable.

## 2023-09-16 NOTE — Assessment & Plan Note (Signed)
Breathing stable.

## 2023-09-16 NOTE — Assessment & Plan Note (Signed)
Symptoms as outlined. Discussed further w/up and evaluation. Discussed urology referral. She declines. Will notify me if changes her mind.

## 2023-09-16 NOTE — Progress Notes (Signed)
Subjective:    Patient ID: Debra Kirk, female    DOB: 1930/05/29, 89 y.o.   MRN: [id number]  Patient here for  Chief Complaint  Patient presents with   Medical Management of Chronic Issues    HPI Follow up regarding hypercholesterolemia, diabetes and hypertension. She is accompanied by her daughter.  History obtained from both of them.  She reports she is doing relatively well. No chest pain or sob reported. Breathing stable. No abdominal pain or bowel change reported. No cough or congestion. Does report urinary issues - some increased frequency, urgency - varies at times. No dysuria. Will have to push sometimes to urinate. Discussed further w/up and evaluation. Discussed PFPT.    Past Medical History:  Diagnosis Date   Carotid artery disease (HCC)    Compression fracture    s/p fosamax, prolia   Diabetes mellitus (HCC)    Femoral neuropathy    left, elevated CRP, ESR, negative FANA and ANCA   Fibrocystic breast disease    Hypercholesterolemia    Hypertension    Past Surgical History:  Procedure Laterality Date   APPENDECTOMY     CHOLECYSTECTOMY     HERNIA REPAIR     INGUINAL HERNIA REPAIR     left x 1, right x 2   OVARIAN CYST REMOVAL     PARTIAL HYSTERECTOMY     Family History  Problem Relation Age of Onset   Heart disease Father        myocardial infarction   Heart disease Mother        myocardial infarction   Diabetes Mother    Congestive Heart Failure Mother    Bone cancer Sister    Diabetes Sister        x2   Ovarian cancer Sister 80   Heart disease Sister        CABG   Rheum arthritis Sister    Breast cancer Neg Hx    Colon cancer Neg Hx    Social History   Socioeconomic History   Marital status: Widowed    Spouse name: Not on file   Number of children: 3   Years of education: Not on file   Highest education level: Not on file  Occupational History   Not on file  Tobacco Use   Smoking status: Former    Current packs/day: 0.00     Types: Cigarettes    Quit date: 08/20/1958    Years since quitting: 65.1   Smokeless tobacco: Never  Substance and Sexual Activity   Alcohol use: No    Alcohol/week: 0.0 standard drinks of alcohol   Drug use: No   Sexual activity: Never  Other Topics Concern   Not on file  Social History Narrative   Not on file   Social Drivers of Health   Financial Resource Strain: Not on file  Food Insecurity: Not on file  Transportation Needs: Not on file  Physical Activity: Not on file  Stress: Not on file  Social Connections: Not on file     Review of Systems  Constitutional:  Negative for appetite change and unexpected weight change.  HENT:  Negative for congestion and sinus pressure.   Respiratory:  Negative for cough, chest tightness and shortness of breath.   Cardiovascular:  Negative for chest pain, palpitations and leg swelling.  Gastrointestinal:  Negative for abdominal pain, diarrhea, nausea and vomiting.  Genitourinary:  Negative for difficulty urinating and dysuria.  Musculoskeletal:  Negative for joint swelling and  myalgias.  Skin:  Negative for color change and rash.  Neurological:  Negative for dizziness and headaches.  Psychiatric/Behavioral:  Negative for agitation and dysphoric mood.        Objective:     BP 122/70   Pulse 76   Temp 98 F (36.7 C)   Resp 16   Ht 5\' 3"  (1.6 m)   Wt 143 lb 6.4 oz (65 kg)   SpO2 97%   BMI 25.40 kg/m  Wt Readings from Last 3 Encounters:  09/16/23 143 lb 6.4 oz (65 kg)  04/30/23 145 lb 6.4 oz (66 kg)  12/25/22 145 lb (65.8 kg)    Physical Exam Vitals reviewed.  Constitutional:      General: She is not in acute distress.    Appearance: Normal appearance.  HENT:     Head: Normocephalic and atraumatic.     Right Ear: External ear normal.     Left Ear: External ear normal.  Eyes:     General: No scleral icterus.       Right eye: No discharge.        Left eye: No discharge.     Conjunctiva/sclera: Conjunctivae normal.   Neck:     Thyroid: No thyromegaly.  Cardiovascular:     Rate and Rhythm: Normal rate and regular rhythm.  Pulmonary:     Effort: No respiratory distress.     Breath sounds: Normal breath sounds. No wheezing.  Abdominal:     General: Bowel sounds are normal.     Palpations: Abdomen is soft.     Tenderness: There is no abdominal tenderness.  Musculoskeletal:        General: No swelling or tenderness.     Cervical back: Neck supple. No tenderness.  Lymphadenopathy:     Cervical: No cervical adenopathy.  Skin:    Findings: No erythema or rash.  Neurological:     Mental Status: She is alert.  Psychiatric:        Mood and Affect: Mood normal.        Behavior: Behavior normal.         Outpatient Encounter Medications as of 09/16/2023  Medication Sig   AMBULATORY NON FORMULARY MEDICATION Medication Name: Incentive Spirometry Use 10-15 times daily.   amLODipine (NORVASC) 5 MG tablet TAKE 1 TABLET BY MOUTH DAILY   Calcium Carbonate-Vitamin D 600-400 MG-UNIT tablet Take 1 tablet by mouth 2 (two) times daily.   CONTOUR TEST test strip CHECK DAILY AND AS NEEDED **E11.9**   denosumab (PROLIA) 60 MG/ML SOLN injection Inject 60 mg into the skin every 6 (six) months. Administer in upper arm, thigh, or abdomen   empagliflozin (JARDIANCE) 25 MG TABS tablet Take 1 tablet (25 mg total) by mouth daily.   glimepiride (AMARYL) 2 MG tablet TAKE 1 TABLET BY MOUTH TWICE  DAILY   meclizine (ANTIVERT) 12.5 MG tablet Take 1 tablet (12.5 mg total) by mouth 2 (two) times daily as needed for dizziness.   metoCLOPramide (REGLAN) 5 MG tablet Take 5 mg by mouth 2 (two) times daily.    omeprazole (PRILOSEC) 20 MG capsule Take 1 capsule (20 mg total) by mouth 2 (two) times daily.   Respiratory Therapy Supplies (FLUTTER) DEVI Use 10-15 times daily   rosuvastatin (CRESTOR) 20 MG tablet TAKE 1 TABLET BY MOUTH DAILY   [DISCONTINUED] dicyclomine (BENTYL) 10 MG capsule Take 10 mg by mouth 3 (three) times daily  before meals.    [DISCONTINUED] JARDIANCE 25 MG TABS tablet TAKE 1  TABLET BY MOUTH DAILY   [DISCONTINUED] nitrofurantoin, macrocrystal-monohydrate, (MACROBID) 100 MG capsule Take 1 capsule (100 mg total) by mouth 2 (two) times daily.   No facility-administered encounter medications on file as of 09/16/2023.     Lab Results  Component Value Date   WBC 5.7 12/19/2022   HGB 14.4 12/19/2022   HCT 43.5 12/19/2022   PLT 173.0 12/19/2022   GLUCOSE 210 (H) 04/24/2023   CHOL 168 04/24/2023   TRIG 99.0 04/24/2023   HDL 52.80 04/24/2023   LDLCALC 96 04/24/2023   ALT 11 04/24/2023   AST 12 04/24/2023   NA 138 04/24/2023   K 3.6 04/24/2023   CL 101 04/24/2023   CREATININE 0.83 04/24/2023   BUN 18 04/24/2023   CO2 26 04/24/2023   TSH 1.04 12/19/2022   HGBA1C 8.0 (H) 04/24/2023   MICROALBUR 6.2 (H) 12/20/2021    NM Bone Scan Whole Body Result Date: 09/20/2022 CLINICAL DATA:  Incidental bone lesion RIGHT femur at femoral head EXAM: NUCLEAR MEDICINE WHOLE BODY BONE SCAN TECHNIQUE: Whole body anterior and posterior images were obtained approximately 3 hours after intravenous injection of radiopharmaceutical. RADIOPHARMACEUTICALS:  23.36 mCi Technetium-5m MDP IV COMPARISON:  None available Correlation: RIGHT hip and lumbar spine radiographs of 09/05/2022 FINDINGS: Foci of abnormal tracer uptake located at multiple sites in the lumbar spine predominantly along the RIGHT lateral border of the levoconvex scoliosis. These likely reflect degenerative disc and facet disease changes seen on recent radiographs. Additional focal uptake at LEFT L5-S1 corresponding to degenerative changes and sclerosis on radiographs. Additional uptake at cervical spine shoulders, wrists, sternoclavicular joints, hips, appearance typically degenerative. A longitudinal focus of increased uptake is seen at the distal RIGHT femoral diaphysis, nonspecific. No additional worrisome sites of abnormal tracer accumulation. Expected urinary  tract and soft tissue distribution of tracer. IMPRESSION: Multiple sites of abnormal tracer uptake at thoracic and lumbar spine and in multiple joints, typically degenerative, with uptake in the thoracolumbar spine corresponding to advanced degenerative changes on radiographs. Longitudinal increased tracer uptake at distal RIGHT femoral diaphysis, nonspecific, recommend radiographic correlation to determine etiology. No abnormal focal tracer uptake at the RIGHT femoral head to suggest metastatic disease. Electronically Signed   By: Ulyses Southward M.D.   On: 09/20/2022 08:39       Assessment & Plan:  Health care maintenance Assessment & Plan: Physical to be performed next visit.  Mammogram 06/12/17 - Birads II.  Declines further mammograms.  Colonoscopy 02/2005.  89 years old now.  Hold colon screening.     Osteoporosis without current pathological fracture, unspecified osteoporosis type Assessment & Plan: Receiving prolia. Last 05/15/23.   Hypercholesterolemia Assessment & Plan: Continue crestor.  Low cholesterol diet and exercise.  Follow lipid panel and liver function tests.    Orders: -     Basic metabolic panel -     Hepatic function panel -     Lipid panel -     Lipid panel; Future -     Hepatic function panel; Future  Type 2 diabetes mellitus with hyperglycemia, without long-term current use of insulin (HCC) Assessment & Plan: Low carb diet and exercise.  Continues on jardiance and amaryl.  Follow met b and a1c.   Discussed diet and exercise.    Orders: -     Hemoglobin A1c -     Hemoglobin A1c; Future  Primary hypertension Assessment & Plan: Continue amlodipine. Follow pressures.  Follow metabolic panel.   Orders: -     CBC with Differential/Platelet;  Future -     Basic metabolic panel; Future -     TSH; Future  Gastroparesis due to DM [facility]) Assessment & Plan: Followed by GI. On reglan. Taking benefiber.  Keeping bowels regular. Reports is overall stable.    Coronary  artery disease involving native coronary artery of native heart without angina pectoris Assessment & Plan: No chest pain.  Continue risk factor modification.    Bronchiectasis without complication (HCC) Assessment & Plan: Breathing stable.    Aortic atherosclerosis (HCC) Assessment & Plan: Continue crestor.    Urinary frequency Assessment & Plan: Symptoms as outlined. Discussed further w/up and evaluation. Discussed urology referral. She declines. Will notify me if changes her mind.    Other orders -     Empagliflozin; Take 1 tablet (25 mg total) by mouth daily.  Dispense: 90 tablet; Refill: 3     Dale Nickerson, MD

## 2023-09-16 NOTE — Assessment & Plan Note (Signed)
Receiving prolia. Last 05/15/23.

## 2023-09-16 NOTE — Assessment & Plan Note (Signed)
Continue crestor

## 2023-09-16 NOTE — Assessment & Plan Note (Signed)
Continue amlodipine.  Follow pressures.  Follow metabolic panel.

## 2023-09-16 NOTE — Assessment & Plan Note (Signed)
Continue crestor.  Low cholesterol diet and exercise.  Follow lipid panel and liver function tests.

## 2023-09-16 NOTE — Assessment & Plan Note (Signed)
No chest pain.  Continue risk factor modification.

## 2023-09-17 ENCOUNTER — Encounter: Payer: Self-pay | Admitting: *Deleted

## 2023-09-30 ENCOUNTER — Other Ambulatory Visit (HOSPITAL_COMMUNITY): Payer: Self-pay

## 2023-09-30 ENCOUNTER — Other Ambulatory Visit: Payer: Self-pay

## 2023-09-30 MED ORDER — METOCLOPRAMIDE HCL 5 MG PO TABS
5.0000 mg | ORAL_TABLET | Freq: Three times a day (TID) | ORAL | 3 refills | Status: AC
  Filled 2023-09-30: qty 270, 90d supply, fill #0
  Filled 2024-01-31: qty 270, 90d supply, fill #1
  Filled 2024-05-11: qty 270, 90d supply, fill #2
  Filled 2024-08-14: qty 270, 90d supply, fill #3

## 2023-10-01 ENCOUNTER — Other Ambulatory Visit (HOSPITAL_COMMUNITY): Payer: Self-pay

## 2023-10-01 ENCOUNTER — Other Ambulatory Visit: Payer: Self-pay

## 2023-10-02 ENCOUNTER — Other Ambulatory Visit: Payer: Self-pay

## 2023-10-08 ENCOUNTER — Other Ambulatory Visit (HOSPITAL_COMMUNITY): Payer: Self-pay

## 2023-10-15 DIAGNOSIS — H353221 Exudative age-related macular degeneration, left eye, with active choroidal neovascularization: Secondary | ICD-10-CM | POA: Diagnosis not present

## 2023-10-25 ENCOUNTER — Other Ambulatory Visit: Payer: Self-pay

## 2023-10-25 ENCOUNTER — Other Ambulatory Visit: Payer: Self-pay | Admitting: Internal Medicine

## 2023-10-25 ENCOUNTER — Other Ambulatory Visit: Payer: Self-pay | Admitting: Gastroenterology

## 2023-10-25 ENCOUNTER — Other Ambulatory Visit (HOSPITAL_COMMUNITY): Payer: Self-pay

## 2023-10-27 MED ORDER — DENOSUMAB 60 MG/ML ~~LOC~~ SOSY
60.0000 mg | PREFILLED_SYRINGE | Freq: Once | SUBCUTANEOUS | Status: AC
Start: 2023-11-12 — End: 2023-11-18
  Administered 2023-11-18: 60 mg via SUBCUTANEOUS

## 2023-10-27 NOTE — Addendum Note (Signed)
 Addended by: Warden Fillers on: 10/27/2023 10:40 PM   Modules accepted: Orders

## 2023-10-29 ENCOUNTER — Other Ambulatory Visit: Payer: Self-pay

## 2023-10-29 MED ORDER — OMEPRAZOLE 20 MG PO CPDR
20.0000 mg | DELAYED_RELEASE_CAPSULE | Freq: Two times a day (BID) | ORAL | 3 refills | Status: DC
  Filled 2023-10-29: qty 180, 90d supply, fill #0

## 2023-10-30 ENCOUNTER — Other Ambulatory Visit (HOSPITAL_COMMUNITY): Payer: Self-pay

## 2023-11-04 ENCOUNTER — Telehealth: Payer: Self-pay | Admitting: Internal Medicine

## 2023-11-04 NOTE — Telephone Encounter (Signed)
 Copied from CRM [id number]. Topic: Clinical - Request for Lab/Test Order >> Nov 04, 2023  1:41 PM Orinda Kenner C wrote: Reason for CRM: Tiffany from [facility] support plus [phone] Monday-Friday 8:30- 8 pm EST received an order for Prolia shot and needs insurance Care and Care in [place] went into effective 08/21/2023 and they need the insurance information to complete this request. If the office does not have new insurance information, please contact patient for insurance information because they do not call patient to obtain insurance information. Please advise and call back.

## 2023-11-05 NOTE — Telephone Encounter (Signed)
 I have called & updated insurance info. Pt now has HTA but it is not updated in chart. But it was scanned in on 09/16/23.  Will forward to admin pool to update insurance

## 2023-11-08 NOTE — Telephone Encounter (Signed)
 What is the new amount that needs to be collected for HTA patients this year?

## 2023-11-12 ENCOUNTER — Other Ambulatory Visit (HOSPITAL_COMMUNITY): Payer: Self-pay

## 2023-11-12 ENCOUNTER — Other Ambulatory Visit (HOSPITAL_BASED_OUTPATIENT_CLINIC_OR_DEPARTMENT_OTHER): Payer: Self-pay

## 2023-11-12 DIAGNOSIS — K219 Gastro-esophageal reflux disease without esophagitis: Secondary | ICD-10-CM | POA: Diagnosis not present

## 2023-11-12 DIAGNOSIS — E1143 Type 2 diabetes mellitus with diabetic autonomic (poly)neuropathy: Secondary | ICD-10-CM | POA: Diagnosis not present

## 2023-11-12 DIAGNOSIS — K3184 Gastroparesis: Secondary | ICD-10-CM | POA: Diagnosis not present

## 2023-11-12 DIAGNOSIS — R198 Other specified symptoms and signs involving the digestive system and abdomen: Secondary | ICD-10-CM | POA: Diagnosis not present

## 2023-11-12 MED ORDER — OMEPRAZOLE 20 MG PO CPDR: 20.0000 mg | DELAYED_RELEASE_CAPSULE | Freq: Every day | ORAL | 3 refills | Status: AC

## 2023-11-14 ENCOUNTER — Telehealth: Payer: Self-pay

## 2023-11-14 NOTE — Telephone Encounter (Signed)
 Copied from CRM [id number]. Topic: Clinical - Prescription Issue >> Nov 14, 2023  3:32 PM Debra Kirk wrote: Reason for CRM: Pt states she still has not received a call to administer her Prolia. I did not see anything in her charts recent regarding this. Please advise

## 2023-11-15 ENCOUNTER — Telehealth: Payer: Self-pay

## 2023-11-15 ENCOUNTER — Other Ambulatory Visit (HOSPITAL_COMMUNITY): Payer: Self-pay

## 2023-11-15 NOTE — Telephone Encounter (Signed)
 I have spoke with patient & she is aware of the $332 due for Prolia. PA not required. Pt will try to call back today to schedule an appt for Monday. Please put in appt note: "$332 due for Prolia; PA not req'd/LSW"

## 2023-11-15 NOTE — Telephone Encounter (Signed)
 Pt ready for scheduling for PROLIA on or after : 11/15/23  Option# 1: Buy/Bill (Office supplied medication)  Out-of-pocket cost due at time of clinic visit: $332  Number of injection/visits approved: ---  Primary: [employer] Prolia co-insurance: 20% Admin fee co-insurance: 0%  Secondary: --- Prolia co-insurance:  Admin fee co-insurance:   Medical Benefit Details: Date Benefits were checked: 11/05/23 Deductible: NO/ Coinsurance: 20%/ Admin Fee: 0%  Prior Auth: N/A PA# Expiration Date:   # of doses approved: ----------------------------------------------------------------------- Option# 2- Med Obtained from pharmacy:  Pharmacy benefit: Copay $250 (Paid to pharmacy) Admin Fee: 0% (Pay at clinic)  Prior Auth: N/A PA# Expiration Date:   # of doses approved:   If patient wants fill through the pharmacy benefit please send prescription to: [employer] [facility], and include estimated need by date in rx notes. Pharmacy will ship medication directly to the office.  Patient NOT eligible for Prolia Copay Card. Copay Card can make patient's cost as little as $25. Link to apply: [email or web address]://www.[employer]gensupportplus.com/copay  ** This summary of benefits is an estimation of the patient's out-of-pocket cost. Exact cost may very based on individual plan coverage.

## 2023-11-15 NOTE — Telephone Encounter (Addendum)
 Benefit verification in telephone encounter on 11/15/23. Will update referral.

## 2023-11-18 ENCOUNTER — Ambulatory Visit (INDEPENDENT_AMBULATORY_CARE_PROVIDER_SITE_OTHER)

## 2023-11-18 ENCOUNTER — Other Ambulatory Visit: Payer: Self-pay

## 2023-11-18 ENCOUNTER — Encounter: Payer: Self-pay | Admitting: Pharmacist

## 2023-11-18 ENCOUNTER — Other Ambulatory Visit (HOSPITAL_COMMUNITY): Payer: Self-pay

## 2023-11-18 DIAGNOSIS — M810 Age-related osteoporosis without current pathological fracture: Secondary | ICD-10-CM | POA: Diagnosis not present

## 2023-11-18 MED ORDER — DENOSUMAB 60 MG/ML ~~LOC~~ SOSY: 60.0000 mg | PREFILLED_SYRINGE | SUBCUTANEOUS | Status: DC

## 2023-11-18 NOTE — Progress Notes (Signed)
 Pt presented for their subcutaneous Prolia injection. Pt was identified through two identifiers. Pt was given the information packets about the Prolia and told to schedule their next injection 6 months out. Pt tolerated the subq injection well in the left arm.

## 2023-12-30 ENCOUNTER — Other Ambulatory Visit (HOSPITAL_COMMUNITY): Payer: Self-pay

## 2023-12-30 ENCOUNTER — Other Ambulatory Visit: Payer: Self-pay

## 2023-12-30 MED FILL — Glimepiride Tab 2 MG: ORAL | 90 days supply | Qty: 180 | Fill #0 | Status: AC

## 2023-12-30 MED FILL — Amlodipine Besylate Tab 5 MG (Base Equivalent): ORAL | 90 days supply | Qty: 90 | Fill #0 | Status: AC

## 2024-01-07 DIAGNOSIS — H353221 Exudative age-related macular degeneration, left eye, with active choroidal neovascularization: Secondary | ICD-10-CM | POA: Diagnosis not present

## 2024-01-15 ENCOUNTER — Other Ambulatory Visit (INDEPENDENT_AMBULATORY_CARE_PROVIDER_SITE_OTHER): Payer: 59

## 2024-01-15 ENCOUNTER — Ambulatory Visit: Payer: Self-pay | Admitting: Internal Medicine

## 2024-01-15 DIAGNOSIS — E7800 Pure hypercholesterolemia, unspecified: Secondary | ICD-10-CM | POA: Diagnosis not present

## 2024-01-15 DIAGNOSIS — E1165 Type 2 diabetes mellitus with hyperglycemia: Secondary | ICD-10-CM | POA: Diagnosis not present

## 2024-01-15 DIAGNOSIS — I10 Essential (primary) hypertension: Secondary | ICD-10-CM

## 2024-01-15 LAB — CBC WITH DIFFERENTIAL/PLATELET
Basophils Absolute: 0 10*3/uL (ref 0.0–0.1)
Basophils Relative: 0.9 % (ref 0.0–3.0)
Eosinophils Absolute: 0 10*3/uL (ref 0.0–0.7)
Eosinophils Relative: 0 % (ref 0.0–5.0)
HCT: 41.8 % (ref 36.0–46.0)
Hemoglobin: 14 g/dL (ref 12.0–15.0)
Lymphocytes Relative: 32.9 % (ref 12.0–46.0)
Lymphs Abs: 1.7 10*3/uL (ref 0.7–4.0)
MCHC: 33.4 g/dL (ref 30.0–36.0)
MCV: 89.4 fl (ref 78.0–100.0)
Monocytes Absolute: 0.4 10*3/uL (ref 0.1–1.0)
Monocytes Relative: 8 % (ref 3.0–12.0)
Neutro Abs: 2.9 10*3/uL (ref 1.4–7.7)
Neutrophils Relative %: 58.2 % (ref 43.0–77.0)
Platelets: 172 10*3/uL (ref 150.0–400.0)
RBC: 4.67 Mil/uL (ref 3.87–5.11)
RDW: 14 % (ref 11.5–15.5)
WBC: 5.1 10*3/uL (ref 4.0–10.5)

## 2024-01-15 LAB — BASIC METABOLIC PANEL WITH GFR
BUN: 22 mg/dL (ref 6–23)
CO2: 27 meq/L (ref 19–32)
Calcium: 9.6 mg/dL (ref 8.4–10.5)
Chloride: 100 meq/L (ref 96–112)
Creatinine, Ser: 0.82 mg/dL (ref 0.40–1.20)
GFR: 62.19 mL/min (ref >60.00)
Glucose, Bld: 211 mg/dL — ABNORMAL HIGH (ref 70–99)
Potassium: 4.1 meq/L (ref 3.5–5.1)
Sodium: 138 meq/L (ref 135–145)

## 2024-01-15 LAB — TSH: TSH: 1.79 u[IU]/mL (ref 0.35–5.50)

## 2024-01-15 LAB — HEPATIC FUNCTION PANEL
ALT: 11 U/L (ref 0–35)
AST: 16 U/L (ref 0–37)
Albumin: 4.3 g/dL (ref 3.5–5.2)
Alkaline Phosphatase: 37 U/L — ABNORMAL LOW (ref 39–117)
Bilirubin, Direct: 0.2 mg/dL (ref 0.0–0.3)
Total Bilirubin: 0.9 mg/dL (ref 0.2–1.2)
Total Protein: 7.1 g/dL (ref 6.0–8.3)

## 2024-01-15 LAB — LIPID PANEL
Cholesterol: 172 mg/dL (ref 0–200)
HDL: 63.3 mg/dL (ref >39.00)
LDL Cholesterol: 92 mg/dL (ref 0–99)
NonHDL: 108.39
Total CHOL/HDL Ratio: 3
Triglycerides: 80 mg/dL (ref 0.0–149.0)
VLDL: 16 mg/dL (ref 0.0–40.0)

## 2024-01-15 LAB — HEMOGLOBIN A1C: Hgb A1c MFr Bld: 7.9 % — ABNORMAL HIGH (ref 4.6–6.5)

## 2024-01-21 ENCOUNTER — Encounter: Payer: Self-pay | Admitting: Internal Medicine

## 2024-01-21 ENCOUNTER — Other Ambulatory Visit: Payer: Self-pay | Admitting: Internal Medicine

## 2024-01-21 ENCOUNTER — Ambulatory Visit: Payer: Self-pay | Admitting: Internal Medicine

## 2024-01-21 ENCOUNTER — Ambulatory Visit (INDEPENDENT_AMBULATORY_CARE_PROVIDER_SITE_OTHER): Payer: 59 | Admitting: Internal Medicine

## 2024-01-21 ENCOUNTER — Other Ambulatory Visit: Payer: Self-pay

## 2024-01-21 ENCOUNTER — Other Ambulatory Visit (HOSPITAL_COMMUNITY): Payer: Self-pay

## 2024-01-21 VITALS — BP 110/70 | HR 88 | Temp 98.0°F | Resp 16 | Ht 63.0 in | Wt 144.4 lb

## 2024-01-21 DIAGNOSIS — I2510 Atherosclerotic heart disease of native coronary artery without angina pectoris: Secondary | ICD-10-CM

## 2024-01-21 DIAGNOSIS — E7800 Pure hypercholesterolemia, unspecified: Secondary | ICD-10-CM | POA: Diagnosis not present

## 2024-01-21 DIAGNOSIS — I700 Atherosclerosis of aorta: Secondary | ICD-10-CM | POA: Diagnosis not present

## 2024-01-21 DIAGNOSIS — K3184 Gastroparesis: Secondary | ICD-10-CM | POA: Diagnosis not present

## 2024-01-21 DIAGNOSIS — Z7984 Long term (current) use of oral hypoglycemic drugs: Secondary | ICD-10-CM

## 2024-01-21 DIAGNOSIS — I10 Essential (primary) hypertension: Secondary | ICD-10-CM | POA: Diagnosis not present

## 2024-01-21 DIAGNOSIS — I70213 Atherosclerosis of native arteries of extremities with intermittent claudication, bilateral legs: Secondary | ICD-10-CM

## 2024-01-21 DIAGNOSIS — J479 Bronchiectasis, uncomplicated: Secondary | ICD-10-CM | POA: Diagnosis not present

## 2024-01-21 DIAGNOSIS — E1165 Type 2 diabetes mellitus with hyperglycemia: Secondary | ICD-10-CM

## 2024-01-21 DIAGNOSIS — Z0000 Encounter for general adult medical examination without abnormal findings: Secondary | ICD-10-CM | POA: Diagnosis not present

## 2024-01-21 DIAGNOSIS — E1143 Type 2 diabetes mellitus with diabetic autonomic (poly)neuropathy: Secondary | ICD-10-CM | POA: Diagnosis not present

## 2024-01-21 DIAGNOSIS — M810 Age-related osteoporosis without current pathological fracture: Secondary | ICD-10-CM

## 2024-01-21 LAB — VITAMIN D 25 HYDROXY (VIT D DEFICIENCY, FRACTURES): VITD: 59.46 ng/mL (ref 30.00–100.00)

## 2024-01-21 MED ORDER — CONTOUR TEST VI STRP: ORAL_STRIP | 6 refills | Status: DC

## 2024-01-21 MED ORDER — AMLODIPINE BESYLATE 5 MG PO TABS: 5.0000 mg | ORAL_TABLET | Freq: Every day | ORAL | 3 refills | Status: DC

## 2024-01-21 MED ORDER — CONTOUR TEST VI STRP
ORAL_STRIP | 6 refills | Status: AC
  Filled 2024-01-21 – 2024-02-06 (×4): qty 50, 50d supply, fill #0

## 2024-01-21 MED ORDER — AMLODIPINE BESYLATE 5 MG PO TABS
5.0000 mg | ORAL_TABLET | Freq: Every day | ORAL | 3 refills | Status: AC
  Filled 2024-01-21 – 2024-04-01 (×3): qty 90, 90d supply, fill #0
  Filled 2024-06-29: qty 90, 90d supply, fill #1
  Filled 2024-09-24: qty 90, 90d supply, fill #2

## 2024-01-21 NOTE — Assessment & Plan Note (Signed)
 Continue amlodipine . Follow pressures.  No changes today. Follow metabolic panel.

## 2024-01-21 NOTE — Assessment & Plan Note (Signed)
 Continue prolia

## 2024-01-21 NOTE — Assessment & Plan Note (Signed)
 Physical today 01/21/24. Mammogram 06/12/17 - Birads II.  Declines further mammograms.  Colonoscopy 02/2005.  89 years old now.  Hold colon screening.

## 2024-01-21 NOTE — Progress Notes (Signed)
 Subjective:    Patient ID: Debra Kirk, female    DOB: 29-Dec-1929, 89 y.o.   MRN: [id number]  Patient here for  Chief Complaint  Patient presents with   Annual Exam    HPI Here for a physical exam. Saw GI f/u 11/12/23 - f/u diabetic gastroparesis, GERD and irregular bowel habits. Recommended to decrease omeprazole  to 20mg  q day. Continue reglan . Add benefiber. Receiving prolia . Having increased eye issues.  Seeing ophthalmology. Increased fluid. Just evaluated. Discussed calling today with follow up. No chest pain reported. Breathing stable. Some fatigue with increased activity, but overall appears to be stable. No abdominal pain or bowel change reported.    Past Medical History:  Diagnosis Date   Carotid artery disease (HCC)    Compression fracture    s/p fosamax, prolia    Diabetes mellitus (HCC)    Femoral neuropathy    left, elevated CRP, ESR, negative FANA and ANCA   Fibrocystic breast disease    Hypercholesterolemia    Hypertension    Past Surgical History:  Procedure Laterality Date   APPENDECTOMY     CHOLECYSTECTOMY     HERNIA REPAIR     INGUINAL HERNIA REPAIR     left x 1, right x 2   OVARIAN CYST REMOVAL     PARTIAL HYSTERECTOMY     Family History  Problem Relation Age of Onset   Heart disease Father        myocardial infarction   Heart disease Mother        myocardial infarction   Diabetes Mother    Congestive Heart Failure Mother    Bone cancer Sister    Diabetes Sister        x2   Ovarian cancer Sister 81   Heart disease Sister        CABG   Rheum arthritis Sister    Breast cancer Neg Hx    Colon cancer Neg Hx    Social History   Socioeconomic History   Marital status: Widowed    Spouse name: Not on file   Number of children: 3   Years of education: Not on file   Highest education level: Not on file  Occupational History   Not on file  Tobacco Use   Smoking status: Former    Current packs/day: 0.00    Types: Cigarettes    Quit  date: 08/20/1958    Years since quitting: 65.4   Smokeless tobacco: Never  Substance and Sexual Activity   Alcohol use: No    Alcohol/week: 0.0 standard drinks of alcohol   Drug use: No   Sexual activity: Never  Other Topics Concern   Not on file  Social History Narrative   Not on file   Social Drivers of Health   Financial Resource Strain: Not on file  Food Insecurity: Not on file  Transportation Needs: Not on file  Physical Activity: Not on file  Stress: Not on file  Social Connections: Not on file     Review of Systems  Constitutional:  Negative for appetite change and unexpected weight change.  HENT:  Negative for congestion, sinus pressure and sore throat.   Eyes:  Positive for visual disturbance. Negative for pain.  Respiratory:  Negative for cough, chest tightness and shortness of breath.   Cardiovascular:  Negative for chest pain, palpitations and leg swelling.  Gastrointestinal:  Negative for abdominal pain, diarrhea, nausea and vomiting.  Genitourinary:  Negative for difficulty urinating and dysuria.  Musculoskeletal:  Negative for joint swelling and myalgias.  Skin:  Negative for color change and rash.  Neurological:  Negative for dizziness and headaches.  Hematological:  Negative for adenopathy. Does not bruise/bleed easily.  Psychiatric/Behavioral:  Negative for agitation and dysphoric mood.        Objective:     BP 110/70   Pulse 88   Temp 98 F (36.7 C)   Resp 16   Ht 5\' 3"  (1.6 m)   Wt 144 lb 6.4 oz (65.5 kg)   SpO2 98%   BMI 25.58 kg/m  Wt Readings from Last 3 Encounters:  01/21/24 144 lb 6.4 oz (65.5 kg)  09/16/23 143 lb 6.4 oz (65 kg)  04/30/23 145 lb 6.4 oz (66 kg)    Physical Exam Vitals reviewed.  Constitutional:      General: She is not in acute distress.    Appearance: Normal appearance. She is well-developed.  HENT:     Head: Normocephalic and atraumatic.     Right Ear: External ear normal.     Left Ear: External ear normal.      Mouth/Throat:     Pharynx: No oropharyngeal exudate or posterior oropharyngeal erythema.  Eyes:     General: No scleral icterus.       Right eye: No discharge.        Left eye: No discharge.     Conjunctiva/sclera: Conjunctivae normal.  Neck:     Thyroid : No thyromegaly.  Cardiovascular:     Rate and Rhythm: Normal rate and regular rhythm.  Pulmonary:     Effort: No tachypnea, accessory muscle usage or respiratory distress.     Breath sounds: Normal breath sounds. No decreased breath sounds or wheezing.  Chest:  Breasts:    Right: No inverted nipple, mass, nipple discharge or tenderness (no axillary adenopathy).     Left: No inverted nipple, mass, nipple discharge or tenderness (no axilarry adenopathy).  Abdominal:     General: Bowel sounds are normal.     Palpations: Abdomen is soft.     Tenderness: There is no abdominal tenderness.  Musculoskeletal:        General: No swelling or tenderness.     Cervical back: Neck supple.  Lymphadenopathy:     Cervical: No cervical adenopathy.  Skin:    Findings: No erythema or rash.  Neurological:     Mental Status: She is alert and oriented to person, place, and time.  Psychiatric:        Mood and Affect: Mood normal.        Behavior: Behavior normal.      Diabetic foot exam was performed with the following findings:   No deformities, ulcerations, or other skin breakdown Normal sensation of 10g monofilament Intact posterior tibialis and dorsalis pedis pulses      Outpatient Encounter Medications as of 01/21/2024  Medication Sig   AMBULATORY NON FORMULARY MEDICATION Medication Name: Incentive Spirometry Use 10-15 times daily.   amLODipine  (NORVASC ) 5 MG tablet Take 1 tablet (5 mg total) by mouth daily.   Calcium  Carbonate-Vitamin D  600-400 MG-UNIT tablet Take 1 tablet by mouth 2 (two) times daily.   denosumab  (PROLIA ) 60 MG/ML SOLN injection Inject 60 mg into the skin every 6 (six) months. Administer in upper arm, thigh, or  abdomen   empagliflozin  (JARDIANCE ) 25 MG TABS tablet Take 1 tablet (25 mg total) by mouth daily.   glimepiride  (AMARYL ) 2 MG tablet Take 1 tablet (2 mg total) by mouth 2 (two) times daily.  glucose blood (CONTOUR TEST) test strip Use as instructed   meclizine  (ANTIVERT ) 12.5 MG tablet Take 1 tablet (12.5 mg total) by mouth 2 (two) times daily as needed for dizziness.   metoCLOPramide  (REGLAN ) 5 MG tablet Take 1 tablet (5 mg total) by mouth 3 (three) times daily before meals.   omeprazole  (PRILOSEC) 20 MG capsule Take 1 capsule (20 mg total) by mouth daily.   Respiratory Therapy Supplies (FLUTTER) DEVI Use 10-15 times daily   rosuvastatin  (CRESTOR ) 20 MG tablet Take 1 tablet (20 mg total) by mouth daily.   [DISCONTINUED] amLODipine  (NORVASC ) 5 MG tablet Take 1 tablet (5 mg total) by mouth daily.   [DISCONTINUED] amLODipine  (NORVASC ) 5 MG tablet Take 1 tablet (5 mg total) by mouth daily.   [DISCONTINUED] CONTOUR TEST test strip CHECK DAILY AND AS NEEDED **E11.9**   [DISCONTINUED] glucose blood (CONTOUR TEST) test strip Use as instructed   [DISCONTINUED] metoCLOPramide  (REGLAN ) 5 MG tablet Take 5 mg by mouth 2 (two) times daily.    [DISCONTINUED] omeprazole  (PRILOSEC) 20 MG capsule Take 1 capsule (20 mg total) by mouth 2 (two) times daily.   Facility-Administered Encounter Medications as of 01/21/2024  Medication   [START ON 05/19/2024] denosumab  (PROLIA ) injection 60 mg     Lab Results  Component Value Date   WBC 5.1 01/15/2024   HGB 14.0 01/15/2024   HCT 41.8 01/15/2024   PLT 172.0 01/15/2024   GLUCOSE 211 (H) 01/15/2024   CHOL 172 01/15/2024   TRIG 80.0 01/15/2024   HDL 63.30 01/15/2024   LDLCALC 92 01/15/2024   ALT 11 01/15/2024   AST 16 01/15/2024   NA 138 01/15/2024   K 4.1 01/15/2024   CL 100 01/15/2024   CREATININE 0.82 01/15/2024   BUN 22 01/15/2024   CO2 27 01/15/2024   TSH 1.79 01/15/2024   HGBA1C 7.9 (H) 01/15/2024   MICROALBUR 6.2 (H) 12/20/2021    NM Bone Scan  Whole Body Result Date: 09/20/2022 CLINICAL DATA:  Incidental bone lesion RIGHT femur at femoral head EXAM: NUCLEAR MEDICINE WHOLE BODY BONE SCAN TECHNIQUE: Whole body anterior and posterior images were obtained approximately 3 hours after intravenous injection of radiopharmaceutical. RADIOPHARMACEUTICALS:  23.36 mCi Technetium-70m MDP IV COMPARISON:  None available Correlation: RIGHT hip and lumbar spine radiographs of 09/05/2022 FINDINGS: Foci of abnormal tracer uptake located at multiple sites in the lumbar spine predominantly along the RIGHT lateral border of the levoconvex scoliosis. These likely reflect degenerative disc and facet disease changes seen on recent radiographs. Additional focal uptake at LEFT L5-S1 corresponding to degenerative changes and sclerosis on radiographs. Additional uptake at cervical spine shoulders, wrists, sternoclavicular joints, hips, appearance typically degenerative. A longitudinal focus of increased uptake is seen at the distal RIGHT femoral diaphysis, nonspecific. No additional worrisome sites of abnormal tracer accumulation. Expected urinary tract and soft tissue distribution of tracer. IMPRESSION: Multiple sites of abnormal tracer uptake at thoracic and lumbar spine and in multiple joints, typically degenerative, with uptake in the thoracolumbar spine corresponding to advanced degenerative changes on radiographs. Longitudinal increased tracer uptake at distal RIGHT femoral diaphysis, nonspecific, recommend radiographic correlation to determine etiology. No abnormal focal tracer uptake at the RIGHT femoral head to suggest metastatic disease. Electronically Signed   By: Wynelle Heather M.D.   On: 09/20/2022 08:39       Assessment & Plan:  Routine general medical examination at a health care facility  Primary hypertension Assessment & Plan: Continue amlodipine . Follow pressures.  No changes today. Follow metabolic panel.  Orders: -     Basic metabolic panel with GFR;  Future  Hypercholesterolemia Assessment & Plan: Continue crestor .  Low cholesterol diet and exercise.  Discussed recent labs. Follow lipid panel.   Orders: -     Lipid panel; Future -     Hepatic function panel; Future  Type 2 diabetes mellitus with hyperglycemia, without long-term current use of insulin  (HCC) Assessment & Plan: Low carb diet and exercise.  Continues on jardiance  and amaryl .  A1c just checked and improved 7.9. follow met b and A1c.   Orders: -     Hemoglobin A1c; Future  Health care maintenance Assessment & Plan: Physical today 01/21/24. Mammogram 06/12/17 - Birads II.  Declines further mammograms.  Colonoscopy 02/2005.  89 years old now.  Hold colon screening.     Aortic atherosclerosis (HCC) Assessment & Plan: Continue crestor .    Atherosclerosis of native artery of both lower extremities with intermittent claudication [facility]) Assessment & Plan: Has been evaluated by Dr Prescilla Brod.  Continue crestor . Stable.    Bronchiectasis without complication (HCC) Assessment & Plan: Breathing stable.    Coronary artery disease involving native coronary artery of native heart without angina pectoris Assessment & Plan: Continue risk factor modification.    Gastroparesis due to DM [facility]) Assessment & Plan: . Saw GI f/u 11/12/23 - f/u diabetic gastroparesis, GERD and irregular bowel habits. Recommended to decrease omeprazole  to 20mg  q day. Continue reglan .   Osteoporosis without current pathological fracture, unspecified osteoporosis type Assessment & Plan: Continue prolia .   Orders: -     VITAMIN D  25 Hydroxy (Vit-D Deficiency, Fractures); Future  Other orders -     amLODIPine  Besylate; Take 1 tablet (5 mg total) by mouth daily.  Dispense: 90 tablet; Refill: 3 -     Contour Test; Use as instructed  Dispense: 50 strip; Refill: 6     Dellar Fenton, MD

## 2024-01-21 NOTE — Assessment & Plan Note (Signed)
 Continue crestor .  Low cholesterol diet and exercise.  Discussed recent labs. Follow lipid panel.

## 2024-01-21 NOTE — Assessment & Plan Note (Signed)
 Breathing stable.

## 2024-01-21 NOTE — Assessment & Plan Note (Signed)
Continue risk factor modification 

## 2024-01-21 NOTE — Assessment & Plan Note (Signed)
Has been evaluated by Dr Gilda Crease.  Continue crestor.  Stable.

## 2024-01-21 NOTE — Assessment & Plan Note (Signed)
.   Saw GI f/u 11/12/23 - f/u diabetic gastroparesis, GERD and irregular bowel habits. Recommended to decrease omeprazole  to 20mg  q day. Continue reglan .

## 2024-01-21 NOTE — Assessment & Plan Note (Signed)
 Continue crestor

## 2024-01-21 NOTE — Assessment & Plan Note (Signed)
 Low carb diet and exercise.  Continues on jardiance  and amaryl .  A1c just checked and improved 7.9. follow met b and A1c.

## 2024-01-23 DIAGNOSIS — H353211 Exudative age-related macular degeneration, right eye, with active choroidal neovascularization: Secondary | ICD-10-CM | POA: Diagnosis not present

## 2024-01-23 DIAGNOSIS — Z961 Presence of intraocular lens: Secondary | ICD-10-CM | POA: Diagnosis not present

## 2024-01-23 DIAGNOSIS — H353221 Exudative age-related macular degeneration, left eye, with active choroidal neovascularization: Secondary | ICD-10-CM | POA: Diagnosis not present

## 2024-01-29 ENCOUNTER — Telehealth: Payer: Self-pay

## 2024-01-29 ENCOUNTER — Other Ambulatory Visit (HOSPITAL_COMMUNITY): Payer: Self-pay

## 2024-01-29 NOTE — Telephone Encounter (Signed)
 Pharmacy Patient Advocate Encounter   Received notification from CoverMyMeds that prior authorization for Contour Next Test strips is required/requested.   Insurance verification completed.   The patient is insured through [facility] ADVANTAGE/RX ADVANCE .   Per test claim: PA required; PA submitted to above mentioned insurance via CoverMyMeds Key/confirmation #/EOC [id number] Status is pending

## 2024-01-30 ENCOUNTER — Other Ambulatory Visit (HOSPITAL_COMMUNITY): Payer: Self-pay

## 2024-01-31 ENCOUNTER — Other Ambulatory Visit (HOSPITAL_COMMUNITY): Payer: Self-pay

## 2024-02-05 DIAGNOSIS — H353221 Exudative age-related macular degeneration, left eye, with active choroidal neovascularization: Secondary | ICD-10-CM | POA: Diagnosis not present

## 2024-02-05 NOTE — Telephone Encounter (Signed)
 Pharmacy Patient Advocate Encounter  Received notification from [employer] that Prior Authorization for Contour Next Test strips  has been LEFT UNANSWERED BY PLAN.   PER MESSAGE ON COVER MY MEDS. ONE TOUCH AND FREE STYLE LIBRE ARE PREFERRED.   PA #/Case ID/Reference #: [id number]

## 2024-02-06 ENCOUNTER — Other Ambulatory Visit (HOSPITAL_COMMUNITY): Payer: Self-pay

## 2024-02-06 NOTE — Telephone Encounter (Signed)
 Called pharmacy. Prescription was filled for $10 and will be shipped to patient. Patient is aware.

## 2024-02-18 ENCOUNTER — Other Ambulatory Visit (HOSPITAL_COMMUNITY): Payer: Self-pay

## 2024-03-11 DIAGNOSIS — H353221 Exudative age-related macular degeneration, left eye, with active choroidal neovascularization: Secondary | ICD-10-CM | POA: Diagnosis not present

## 2024-04-01 ENCOUNTER — Other Ambulatory Visit: Payer: Self-pay

## 2024-04-01 ENCOUNTER — Other Ambulatory Visit (HOSPITAL_COMMUNITY): Payer: Self-pay

## 2024-04-01 MED FILL — Glimepiride Tab 2 MG: ORAL | 90 days supply | Qty: 180 | Fill #1 | Status: AC

## 2024-04-01 MED FILL — Rosuvastatin Calcium Tab 20 MG: ORAL | 90 days supply | Qty: 90 | Fill #0 | Status: AC

## 2024-04-15 DIAGNOSIS — Z961 Presence of intraocular lens: Secondary | ICD-10-CM | POA: Diagnosis not present

## 2024-04-15 DIAGNOSIS — H353221 Exudative age-related macular degeneration, left eye, with active choroidal neovascularization: Secondary | ICD-10-CM | POA: Diagnosis not present

## 2024-04-15 DIAGNOSIS — H26493 Other secondary cataract, bilateral: Secondary | ICD-10-CM | POA: Diagnosis not present

## 2024-04-15 DIAGNOSIS — H353212 Exudative age-related macular degeneration, right eye, with inactive choroidal neovascularization: Secondary | ICD-10-CM | POA: Diagnosis not present

## 2024-04-17 ENCOUNTER — Telehealth: Payer: Self-pay

## 2024-04-17 ENCOUNTER — Other Ambulatory Visit (HOSPITAL_COMMUNITY): Payer: Self-pay

## 2024-04-17 DIAGNOSIS — M810 Age-related osteoporosis without current pathological fracture: Secondary | ICD-10-CM

## 2024-04-17 NOTE — Telephone Encounter (Signed)
 Prolia VOB initiated via [email or web address]  Next Prolia inj DUE: 05/18/24

## 2024-04-21 ENCOUNTER — Other Ambulatory Visit (HOSPITAL_COMMUNITY): Payer: Self-pay

## 2024-04-21 NOTE — Telephone Encounter (Signed)
 Pt ready for scheduling for PROLIA  on or after : 05/18/24  Option# 2- Med Obtained from pharmacy:  Pharmacy benefit: Copay $250 (Paid to pharmacy) Admin Fee: 0% (Pay at clinic)  Prior Auth: N/A PA# Expiration Date:   # of doses approved:   If patient wants fill through the pharmacy benefit please send prescription to: [facility], and include estimated need by date in rx notes. Pharmacy will ship medication directly to the office.  Patient NOT eligible for Prolia  Copay Card. Copay Card can make patient's cost as little as $25. Link to apply: [email or web address]://[email or web address]  ** This summary of benefits is an estimation of the patient's out-of-pocket cost. Exact cost may very based on individual plan coverage.

## 2024-04-21 NOTE — Telephone Encounter (Signed)
 Effective 04/20/24, Prolia  is moving to non-formulary status on [employer] Part D. Jubbonti is now formulary (Tier 4) with a quantity limit of 2 ml per 365 days. Jubbonti is interchangeable with Prolia .

## 2024-04-27 ENCOUNTER — Ambulatory Visit: Payer: Self-pay

## 2024-04-27 MED ORDER — JUBBONTI 60 MG/ML ~~LOC~~ SOSY: 1.0000 mL | PREFILLED_SYRINGE | SUBCUTANEOUS | 1 refills | Status: DC

## 2024-04-27 NOTE — Telephone Encounter (Signed)
 FYI  Called patient to follow up with her and get her scheduled to come in and talk to Dr Glendia. Patient declined appointment and says she feels like she is doing ok, there has just been a lot going on (2 deaths of people close with her, her daughters business being robbed, etc) and it has made her a little more anxious than normal. Patient stated that she thinks once they get through the funerals and everything she will be able to relax a little bit. She says she will call and let me know at the end of the week if she does not have a better week and wants to come in to see us . Confirmed no suicidal thoughts, thoughts or harming others, etc. Advised patient to let us  know if she changes her mind and would like to set up something to come in. Pt gave verbal understanding.

## 2024-04-27 NOTE — Telephone Encounter (Signed)
 FYI Only or Action Required?: Action required by provider: request for appointment.  Patient was last seen in primary care on 01/21/2024 by Glendia Shad, MD.  Called Nurse Triage reporting Depression.  Symptoms began several days ago.  Interventions attempted: Rest, hydration, or home remedies and Other: distraction.  Symptoms are: unchanged.  Triage Disposition: Home Care  Patient/caregiver understands and will follow disposition?: No, wishes to speak with PCP  Copied from CRM [id number]. Topic: Clinical - Red Word Triage >> Apr 27, 2024  9:58 AM Charolett L wrote: [facility] that prompted transfer to Nurse Triage: Worried, depressed and lonesome Reason for Disposition  Recent death of a loved one  Answer Assessment - Initial Assessment Questions Additional info: 1) She has had spells like this but has never talked with pcp, requesting to speak with Pcp.  2) Has an appointment scheduled with pcp on 05/26/24, no appointments are available sooner. Would you like to work her in sooner?   1. CONCERN: What happened that made you call today?     Daughter calling. She worries a lot, crying alot 2. DEPRESSION SYMPTOM SCREENING: How are you feeling overall? (e.g., decreased energy, increased sleeping or difficulty sleeping, difficulty concentrating, feelings of sadness, guilt, hopelessness, or worthlessness)     worried 3. RISK OF HARM - SUICIDAL IDEATION:  Do you ever have thoughts of hurting or killing yourself?  (e.g., yes, no, no but preoccupation with thoughts about death)     denies 4. RISK OF HARM - HOMICIDAL IDEATION:  Do you ever have thoughts of hurting or killing someone else?  (e.g., yes, no, no but preoccupation with thoughts about death)     denies 5. FUNCTIONAL IMPAIRMENT: How have things been going for you overall? Have you had more difficulty than usual doing your normal daily activities?  (e.g., better, same, worse; self-care, school, work, interactions)     No  impairment 6. SUPPORT: Who is with you now? Who do you live with? Do you have family or friends who you can talk to?      family 7. THERAPIST: Do you have a counselor or therapist? If Yes, ask: What is their name?     no 8. STRESSORS: Has there been any new stress or recent changes in your life?     Friends have passed, missing spouse 35. ALCOHOL USE OR SUBSTANCE USE (DRUG USE): Do you drink alcohol or use any illegal drugs?      10. OTHER: Do you have any other physical symptoms right now? (e.g., fever)       Anxious  Protocols used: Depression-A-AH

## 2024-04-27 NOTE — Addendum Note (Signed)
 Addended by: BRIEN SHARENE RAMAN on: 04/27/2024 04:35 PM   Modules accepted: Orders

## 2024-05-01 NOTE — Telephone Encounter (Unsigned)
 Copied from CRM #[id number]. Topic: General - Call Back - No Documentation >> May 01, 2024 11:34 AM Harlene ORN wrote: Reason for CRM:  Patient called returing her call from a nurse Sueanne. Please call back the patient to discuss.

## 2024-05-01 NOTE — Telephone Encounter (Signed)
 Patient was calling to let me know that she had a good week and she does not think appt is necessary. She was just calling in for an update.

## 2024-05-11 ENCOUNTER — Other Ambulatory Visit (HOSPITAL_COMMUNITY): Payer: Self-pay

## 2024-05-18 ENCOUNTER — Other Ambulatory Visit: Payer: Self-pay | Admitting: *Deleted

## 2024-05-18 ENCOUNTER — Other Ambulatory Visit: Payer: Self-pay

## 2024-05-18 ENCOUNTER — Other Ambulatory Visit (HOSPITAL_COMMUNITY): Payer: Self-pay

## 2024-05-18 ENCOUNTER — Encounter: Payer: Self-pay | Admitting: Internal Medicine

## 2024-05-18 ENCOUNTER — Encounter (HOSPITAL_COMMUNITY): Payer: Self-pay

## 2024-05-18 DIAGNOSIS — M810 Age-related osteoporosis without current pathological fracture: Secondary | ICD-10-CM

## 2024-05-18 MED ORDER — JUBBONTI 60 MG/ML ~~LOC~~ SOSY
1.0000 mL | PREFILLED_SYRINGE | SUBCUTANEOUS | 1 refills | Status: AC
  Filled 2024-05-18: qty 1, fill #0
  Filled 2024-05-18: qty 1, 180d supply, fill #0

## 2024-05-18 NOTE — Progress Notes (Signed)
 Patient to be enrolled with [facility]. Routed to Tiffany.

## 2024-05-18 NOTE — Progress Notes (Signed)
 Specialty Pharmacy Initial Fill Coordination Note  Debra Kirk is a 89 y.o. female contacted today regarding initial fill of specialty medication(s) Denosumab -bbdz (Jubbonti )   Patient requested Courier to Provider Office   Delivery date: 05/20/24   Verified address: [facility]- [address]  [address]   Medication will be filled on 9/30.   Patient is aware of $250 copayment.

## 2024-05-18 NOTE — Progress Notes (Signed)
 Pharmacy Patient Advocate Encounter  Insurance verification completed.   The patient is insured through [facility] ADVANTAGE/RX ADVANCE   Ran test claim for Jubbonti . Co-pay is $250.  This test claim was processed through [facility]- copay amounts may vary at other pharmacies due to pharmacy/plan contracts, or as the patient moves through the different stages of their insurance plan.

## 2024-05-19 ENCOUNTER — Other Ambulatory Visit: Payer: Self-pay

## 2024-05-20 ENCOUNTER — Other Ambulatory Visit

## 2024-05-20 DIAGNOSIS — H353221 Exudative age-related macular degeneration, left eye, with active choroidal neovascularization: Secondary | ICD-10-CM | POA: Diagnosis not present

## 2024-05-20 DIAGNOSIS — E1165 Type 2 diabetes mellitus with hyperglycemia: Secondary | ICD-10-CM | POA: Diagnosis not present

## 2024-05-20 DIAGNOSIS — E7800 Pure hypercholesterolemia, unspecified: Secondary | ICD-10-CM

## 2024-05-20 DIAGNOSIS — I10 Essential (primary) hypertension: Secondary | ICD-10-CM | POA: Diagnosis not present

## 2024-05-20 LAB — HEMOGLOBIN A1C: Hgb A1c MFr Bld: 7.8 % — ABNORMAL HIGH (ref 4.6–6.5)

## 2024-05-21 ENCOUNTER — Other Ambulatory Visit (HOSPITAL_COMMUNITY): Payer: Self-pay

## 2024-05-21 LAB — BASIC METABOLIC PANEL WITH GFR
BUN: 17 mg/dL (ref 6–23)
CO2: 29 meq/L (ref 19–32)
Calcium: 9.7 mg/dL (ref 8.4–10.5)
Chloride: 101 meq/L (ref 96–112)
Creatinine, Ser: 0.83 mg/dL (ref 0.40–1.20)
GFR: 61.14 mL/min (ref >60.00)
Glucose, Bld: 202 mg/dL — ABNORMAL HIGH (ref 70–99)
Potassium: 3.8 meq/L (ref 3.5–5.1)
Sodium: 138 meq/L (ref 135–145)

## 2024-05-21 LAB — HEPATIC FUNCTION PANEL
ALT: 10 U/L (ref 0–35)
AST: 13 U/L (ref 0–37)
Albumin: 4.2 g/dL (ref 3.5–5.2)
Alkaline Phosphatase: 35 U/L — ABNORMAL LOW (ref 39–117)
Bilirubin, Direct: 0.2 mg/dL (ref 0.0–0.3)
Total Bilirubin: 0.8 mg/dL (ref 0.2–1.2)
Total Protein: 6.9 g/dL (ref 6.0–8.3)

## 2024-05-21 LAB — LIPID PANEL
Cholesterol: 163 mg/dL (ref 0–200)
HDL: 57.3 mg/dL (ref >39.00)
LDL Cholesterol: 89 mg/dL (ref 0–99)
NonHDL: 105.21
Total CHOL/HDL Ratio: 3
Triglycerides: 83 mg/dL (ref 0.0–149.0)
VLDL: 16.6 mg/dL (ref 0.0–40.0)

## 2024-05-25 ENCOUNTER — Ambulatory Visit

## 2024-05-26 ENCOUNTER — Ambulatory Visit: Admitting: Internal Medicine

## 2024-05-26 VITALS — BP 122/74 | HR 73 | Resp 16 | Ht 63.0 in | Wt 143.0 lb

## 2024-05-26 DIAGNOSIS — J479 Bronchiectasis, uncomplicated: Secondary | ICD-10-CM

## 2024-05-26 DIAGNOSIS — I10 Essential (primary) hypertension: Secondary | ICD-10-CM | POA: Diagnosis not present

## 2024-05-26 DIAGNOSIS — Z23 Encounter for immunization: Secondary | ICD-10-CM | POA: Diagnosis not present

## 2024-05-26 DIAGNOSIS — I2510 Atherosclerotic heart disease of native coronary artery without angina pectoris: Secondary | ICD-10-CM

## 2024-05-26 DIAGNOSIS — K3184 Gastroparesis: Secondary | ICD-10-CM

## 2024-05-26 DIAGNOSIS — E1143 Type 2 diabetes mellitus with diabetic autonomic (poly)neuropathy: Secondary | ICD-10-CM | POA: Diagnosis not present

## 2024-05-26 DIAGNOSIS — E1165 Type 2 diabetes mellitus with hyperglycemia: Secondary | ICD-10-CM | POA: Diagnosis not present

## 2024-05-26 DIAGNOSIS — E7800 Pure hypercholesterolemia, unspecified: Secondary | ICD-10-CM | POA: Diagnosis not present

## 2024-05-26 DIAGNOSIS — G6289 Other specified polyneuropathies: Secondary | ICD-10-CM

## 2024-05-26 DIAGNOSIS — Z7984 Long term (current) use of oral hypoglycemic drugs: Secondary | ICD-10-CM

## 2024-05-26 DIAGNOSIS — M810 Age-related osteoporosis without current pathological fracture: Secondary | ICD-10-CM

## 2024-05-26 DIAGNOSIS — I700 Atherosclerosis of aorta: Secondary | ICD-10-CM | POA: Diagnosis not present

## 2024-05-26 NOTE — Progress Notes (Signed)
 Subjective:    Patient ID: Debra Kirk, female    DOB: 1929/09/16, 89 y.o.   MRN: [id number]  Patient here for  Chief Complaint  Patient presents with   Medical Management of Chronic Issues    HPI Here for a scheduled follow up - follow up regarding hypertension, diabetes and hypercholesterolemia. She is accompanied by her daughter. History obtained from both of them. Saw GI f/u 11/12/23 - f/u diabetic gastroparesis, GERD and irregular bowel habits. Recommended to decrease omeprazole  to 20mg  q day. Continue reglan . Add benefiber overall stable. Breathing stable. No chest pain reported. No abdominal pain or bowel change reported. Discussed increased stress. Worrying about her daughter. Discussed medication. She does not feel she needs. Discussed labs. A1c 7.8 - slightly improved.    Past Medical History:  Diagnosis Date   Carotid artery disease    Compression fracture    s/p fosamax, prolia    Diabetes mellitus (HCC)    Femoral neuropathy    left, elevated CRP, ESR, negative FANA and ANCA   Fibrocystic breast disease    Hypercholesterolemia    Hypertension    Past Surgical History:  Procedure Laterality Date   APPENDECTOMY     CHOLECYSTECTOMY     HERNIA REPAIR     INGUINAL HERNIA REPAIR     left x 1, right x 2   OVARIAN CYST REMOVAL     PARTIAL HYSTERECTOMY     Family History  Problem Relation Age of Onset   Heart disease Father        myocardial infarction   Heart disease Mother        myocardial infarction   Diabetes Mother    Congestive Heart Failure Mother    Bone cancer Sister    Diabetes Sister        x2   Ovarian cancer Sister 84   Heart disease Sister        CABG   Rheum arthritis Sister    Breast cancer Neg Hx    Colon cancer Neg Hx    Social History   Socioeconomic History   Marital status: Widowed    Spouse name: Not on file   Number of children: 3   Years of education: Not on file   Highest education level: Not on file  Occupational  History   Not on file  Tobacco Use   Smoking status: Former    Current packs/day: 0.00    Types: Cigarettes    Quit date: 08/20/1958    Years since quitting: 65.8   Smokeless tobacco: Never  Substance and Sexual Activity   Alcohol use: No    Alcohol/week: 0.0 standard drinks of alcohol   Drug use: No   Sexual activity: Never  Other Topics Concern   Not on file  Social History Narrative   Not on file   Social Drivers of Health   Financial Resource Strain: Not on file  Food Insecurity: Not on file  Transportation Needs: Not on file  Physical Activity: Not on file  Stress: Not on file  Social Connections: Not on file     Review of Systems  Constitutional:  Negative for appetite change and unexpected weight change.  HENT:  Negative for congestion and sinus pressure.   Respiratory:  Negative for cough, chest tightness and shortness of breath.   Cardiovascular:  Negative for chest pain, palpitations and leg swelling.  Gastrointestinal:  Negative for abdominal pain, diarrhea, nausea and vomiting.  Genitourinary:  Negative for difficulty  urinating and dysuria.  Musculoskeletal:  Negative for joint swelling and myalgias.  Skin:  Negative for color change and rash.  Neurological:  Negative for dizziness and headaches.  Psychiatric/Behavioral:  Negative for agitation and dysphoric mood.        Increased stress as outlined.        Objective:     BP 122/74   Pulse 73   Resp 16   Ht 5' 3 (1.6 m)   Wt 143 lb (64.9 kg)   SpO2 98%   BMI 25.33 kg/m  Wt Readings from Last 3 Encounters:  05/26/24 143 lb (64.9 kg)  01/21/24 144 lb 6.4 oz (65.5 kg)  09/16/23 143 lb 6.4 oz (65 kg)    Physical Exam Vitals reviewed.  Constitutional:      General: She is not in acute distress.    Appearance: Normal appearance.  HENT:     Head: Normocephalic and atraumatic.     Right Ear: External ear normal.     Left Ear: External ear normal.     Mouth/Throat:     Pharynx: No  oropharyngeal exudate or posterior oropharyngeal erythema.  Eyes:     General: No scleral icterus.       Right eye: No discharge.        Left eye: No discharge.     Conjunctiva/sclera: Conjunctivae normal.  Neck:     Thyroid : No thyromegaly.  Cardiovascular:     Rate and Rhythm: Normal rate and regular rhythm.  Pulmonary:     Effort: No respiratory distress.     Breath sounds: Normal breath sounds. No wheezing.  Abdominal:     General: Bowel sounds are normal.     Palpations: Abdomen is soft.     Tenderness: There is no abdominal tenderness.  Musculoskeletal:        General: No swelling or tenderness.     Cervical back: Neck supple. No tenderness.  Lymphadenopathy:     Cervical: No cervical adenopathy.  Skin:    Findings: No erythema or rash.  Neurological:     Mental Status: She is alert.  Psychiatric:        Mood and Affect: Mood normal.        Behavior: Behavior normal.         Outpatient Encounter Medications as of 05/26/2024  Medication Sig   AMBULATORY NON FORMULARY MEDICATION Medication Name: Incentive Spirometry Use 10-15 times daily.   amLODipine  (NORVASC ) 5 MG tablet Take 1 tablet (5 mg total) by mouth daily.   Calcium  Carbonate-Vitamin D  600-400 MG-UNIT tablet Take 1 tablet by mouth 2 (two) times daily.   denosumab -bbdz (JUBBONTI ) 60 MG/ML SOSY Inject 1 mL into the skin every 6 (six) months.   empagliflozin  (JARDIANCE ) 25 MG TABS tablet Take 1 tablet (25 mg total) by mouth daily.   glimepiride  (AMARYL ) 2 MG tablet Take 1 tablet (2 mg total) by mouth 2 (two) times daily.   glucose blood (CONTOUR TEST) test strip Use as instructed to test blood sugar once daily as directed.   meclizine  (ANTIVERT ) 12.5 MG tablet Take 1 tablet (12.5 mg total) by mouth 2 (two) times daily as needed for dizziness.   metoCLOPramide  (REGLAN ) 5 MG tablet Take 1 tablet (5 mg total) by mouth 3 (three) times daily before meals.   omeprazole  (PRILOSEC) 20 MG capsule Take 1 capsule (20 mg  total) by mouth daily.   Respiratory Therapy Supplies (FLUTTER) DEVI Use 10-15 times daily   rosuvastatin  (CRESTOR ) 20 MG  tablet Take 1 tablet (20 mg total) by mouth daily.   No facility-administered encounter medications on file as of 05/26/2024.     Lab Results  Component Value Date   WBC 5.1 01/15/2024   HGB 14.0 01/15/2024   HCT 41.8 01/15/2024   PLT 172.0 01/15/2024   GLUCOSE 202 (H) 05/20/2024   CHOL 163 05/20/2024   TRIG 83.0 05/20/2024   HDL 57.30 05/20/2024   LDLCALC 89 05/20/2024   ALT 10 05/20/2024   AST 13 05/20/2024   NA 138 05/20/2024   K 3.8 05/20/2024   CL 101 05/20/2024   CREATININE 0.83 05/20/2024   BUN 17 05/20/2024   CO2 29 05/20/2024   TSH 1.79 01/15/2024   HGBA1C 7.8 (H) 05/20/2024    NM Bone Scan Whole Body Result Date: 09/20/2022 CLINICAL DATA:  Incidental bone lesion RIGHT femur at femoral head EXAM: NUCLEAR MEDICINE WHOLE BODY BONE SCAN TECHNIQUE: Whole body anterior and posterior images were obtained approximately 3 hours after intravenous injection of radiopharmaceutical. RADIOPHARMACEUTICALS:  23.36 mCi Technetium-60m MDP IV COMPARISON:  None available Correlation: RIGHT hip and lumbar spine radiographs of 09/05/2022 FINDINGS: Foci of abnormal tracer uptake located at multiple sites in the lumbar spine predominantly along the RIGHT lateral border of the levoconvex scoliosis. These likely reflect degenerative disc and facet disease changes seen on recent radiographs. Additional focal uptake at LEFT L5-S1 corresponding to degenerative changes and sclerosis on radiographs. Additional uptake at cervical spine shoulders, wrists, sternoclavicular joints, hips, appearance typically degenerative. A longitudinal focus of increased uptake is seen at the distal RIGHT femoral diaphysis, nonspecific. No additional worrisome sites of abnormal tracer accumulation. Expected urinary tract and soft tissue distribution of tracer. IMPRESSION: Multiple sites of abnormal tracer  uptake at thoracic and lumbar spine and in multiple joints, typically degenerative, with uptake in the thoracolumbar spine corresponding to advanced degenerative changes on radiographs. Longitudinal increased tracer uptake at distal RIGHT femoral diaphysis, nonspecific, recommend radiographic correlation to determine etiology. No abnormal focal tracer uptake at the RIGHT femoral head to suggest metastatic disease. Electronically Signed   By: Oneil Kiss M.D.   On: 09/20/2022 08:39       Assessment & Plan:  Flu vaccine need -     Flu vaccine HIGH DOSE PF(Fluzone Trivalent)  Type 2 diabetes mellitus with hyperglycemia, without long-term current use of insulin  (HCC) Assessment & Plan: Low carb diet and exercise.  Continues on jardiance  and amaryl .  A1c just checked and improved 7.8.  age 89. No change in medication. Follow met b and A1c.    Aortic atherosclerosis Assessment & Plan: Continue crestor .    Bronchiectasis without complication (HCC) Assessment & Plan: Breathing stable.    Coronary artery disease involving native coronary artery of native heart without angina pectoris Assessment & Plan: Continue risk factor modification. Sugars as outlined. Continue crestor .    Gastroparesis due to DM [facility]) Assessment & Plan: . Saw GI f/u 11/12/23 - f/u diabetic gastroparesis, GERD and irregular bowel habits. Recommended to decrease omeprazole  to 20mg  q day. Continue reglan .stable.    Hypercholesterolemia Assessment & Plan: Continue crestor .  Low cholesterol diet and exercise.  Discussed recent labs. Follow lipid panel.  Lab Results  Component Value Date   CHOL 163 05/20/2024   HDL 57.30 05/20/2024   LDLCALC 89 05/20/2024   TRIG 83.0 05/20/2024   CHOLHDL 3 05/20/2024      Primary hypertension Assessment & Plan: Continue amlodipine . Follow pressures.  No changes today. Follow metabolic panel.  Osteoporosis without current pathological fracture, unspecified osteoporosis  type Assessment & Plan: Has been on prolia . Changing to generic and waiting for insurance. Follow. Vitamin D  level 01/21/24 - wnl.    Other polyneuropathy Assessment & Plan: Stable.       Allena Hamilton, MD

## 2024-05-31 ENCOUNTER — Encounter: Payer: Self-pay | Admitting: Internal Medicine

## 2024-05-31 NOTE — Assessment & Plan Note (Signed)
 Continue amlodipine . Follow pressures.  No changes today. Follow metabolic panel.

## 2024-05-31 NOTE — Assessment & Plan Note (Signed)
 Has been on prolia . Changing to generic and waiting for insurance. Follow. Vitamin D  level 01/21/24 - wnl.

## 2024-05-31 NOTE — Assessment & Plan Note (Signed)
.   Saw GI f/u 11/12/23 - f/u diabetic gastroparesis, GERD and irregular bowel habits. Recommended to decrease omeprazole  to 20mg  q day. Continue reglan .stable.

## 2024-05-31 NOTE — Assessment & Plan Note (Signed)
 Breathing stable.

## 2024-05-31 NOTE — Assessment & Plan Note (Signed)
 Low carb diet and exercise.  Continues on jardiance  and amaryl .  A1c just checked and improved 7.8.  age 89. No change in medication. Follow met b and A1c.

## 2024-05-31 NOTE — Assessment & Plan Note (Signed)
 Continue crestor

## 2024-05-31 NOTE — Assessment & Plan Note (Signed)
 Continue risk factor modification. Sugars as outlined. Continue crestor .

## 2024-05-31 NOTE — Assessment & Plan Note (Signed)
 Stable

## 2024-05-31 NOTE — Assessment & Plan Note (Signed)
 Continue crestor .  Low cholesterol diet and exercise.  Discussed recent labs. Follow lipid panel.  Lab Results  Component Value Date   CHOL 163 05/20/2024   HDL 57.30 05/20/2024   LDLCALC 89 05/20/2024   TRIG 83.0 05/20/2024   CHOLHDL 3 05/20/2024

## 2024-06-04 ENCOUNTER — Telehealth: Payer: Self-pay | Admitting: *Deleted

## 2024-06-04 NOTE — Telephone Encounter (Signed)
 Left voicemail to schedule a 30 min nurse visit for Debra Kirk .  Pt will need to have vitals checked, wait 15 mintues after injection & recheck vitals since this is a new medication.  This medication is similar to Prolia  in that it is still given every 6 months.

## 2024-06-15 ENCOUNTER — Other Ambulatory Visit (HOSPITAL_BASED_OUTPATIENT_CLINIC_OR_DEPARTMENT_OTHER): Payer: Self-pay

## 2024-06-15 ENCOUNTER — Other Ambulatory Visit (HOSPITAL_COMMUNITY): Payer: Self-pay

## 2024-06-15 MED ORDER — CEPHALEXIN 500 MG PO CAPS
500.0000 mg | ORAL_CAPSULE | Freq: Three times a day (TID) | ORAL | 0 refills | Status: DC
  Filled 2024-06-15: qty 21, 7d supply, fill #0

## 2024-06-17 ENCOUNTER — Ambulatory Visit

## 2024-06-17 DIAGNOSIS — M810 Age-related osteoporosis without current pathological fracture: Secondary | ICD-10-CM

## 2024-06-17 MED ORDER — DENOSUMAB-BBDZ 60 MG/ML ~~LOC~~ SOSY
60.0000 mg | PREFILLED_SYRINGE | Freq: Once | SUBCUTANEOUS | Status: AC
  Administered 2024-06-17: 60 mg via SUBCUTANEOUS

## 2024-06-17 MED ORDER — DENOSUMAB-BBDZ 60 MG/ML ~~LOC~~ SOSY: 60.0000 mg | PREFILLED_SYRINGE | SUBCUTANEOUS | Status: AC

## 2024-06-17 NOTE — Progress Notes (Signed)
 Patient arrived for her first dose of Jubbonti  . Patient vitals were obtained Temperature 97.5  SpO2  95%  Pulse 85  BP 126/74. Jubbonti  was administered into her left arm subcutaneous. Patient sat for 17 minutes and tolerated the Jubbonti  injection well so I obtained vitals again SpO2  95%  Pulse 81  BP 126/74.

## 2024-06-29 ENCOUNTER — Other Ambulatory Visit (HOSPITAL_COMMUNITY): Payer: Self-pay

## 2024-06-29 ENCOUNTER — Telehealth: Payer: Self-pay

## 2024-06-29 ENCOUNTER — Other Ambulatory Visit: Payer: Self-pay | Admitting: Internal Medicine

## 2024-06-29 ENCOUNTER — Other Ambulatory Visit: Payer: Self-pay

## 2024-06-29 MED ORDER — ROSUVASTATIN CALCIUM 20 MG PO TABS
20.0000 mg | ORAL_TABLET | Freq: Every day | ORAL | 3 refills | Status: AC
  Filled 2024-06-29: qty 90, 90d supply, fill #0
  Filled 2024-09-24: qty 90, 90d supply, fill #1

## 2024-06-29 MED FILL — Glimepiride Tab 2 MG: ORAL | 90 days supply | Qty: 180 | Fill #2 | Status: AC

## 2024-06-29 NOTE — Telephone Encounter (Signed)
 She has been on crestor  and it appears a prescription was sent in for crestor  20mg  q day today. I am not sure why pharmacy is questioning if she should still be on the medication.  Please confirm with pt and pharmacy that no one has stopped the medication.  Rx already sent in. Let me know if questions or problems.

## 2024-06-29 NOTE — Telephone Encounter (Signed)
 Copied from CRM #[id number]. Topic: Clinical - Medication Question >> Jun 29, 2024  9:54 AM Victoria A wrote: Reason for CRM: Patient would like to know if she is suppose to continue taking rosuvastatin  (CRESTOR ) 20 MG tablet because pharmacy told her that they would not refill until the doctor contacted pharmacy-please contact patient so she knows what to do

## 2024-06-29 NOTE — Telephone Encounter (Signed)
 Called [facility] pharmacy they are filling rx now and she also put a note in that she is still to be taking the Crestor . Spoke to pt also and informed her also that they are refilling and she is to continue taking it

## 2024-06-30 ENCOUNTER — Telehealth: Payer: Self-pay | Admitting: Internal Medicine

## 2024-06-30 NOTE — Telephone Encounter (Signed)
 Pt notified. Will come by tomorrow to pick up

## 2024-06-30 NOTE — Telephone Encounter (Signed)
 Signed and placed in box.

## 2024-06-30 NOTE — Telephone Encounter (Signed)
 Handicap placard form has been dropped off to be filled out for pt. It is in Dr Sanmina-sci color folder up front

## 2024-07-01 DIAGNOSIS — H353221 Exudative age-related macular degeneration, left eye, with active choroidal neovascularization: Secondary | ICD-10-CM | POA: Diagnosis not present

## 2024-07-01 NOTE — Telephone Encounter (Signed)
Picked up today.

## 2024-07-23 ENCOUNTER — Encounter: Payer: Self-pay | Admitting: Pharmacist

## 2024-07-23 NOTE — Progress Notes (Signed)
 Pharmacy Quality Measure Review  This patient is appearing on a report for being at risk of failing the adherence measure for cholesterol (statin) medications this calendar year.   Medication: rosuvastatin  20 mg Last fill date: 04/01/24 for 90 day supply  Insurance report was not up to date. No action needed at this time.  Medication has been refilled as of 06/30/24 x90 ds.  Confirmed w Darryle Law, medication was shipped to patient.

## 2024-08-14 ENCOUNTER — Other Ambulatory Visit (HOSPITAL_BASED_OUTPATIENT_CLINIC_OR_DEPARTMENT_OTHER): Payer: Self-pay

## 2024-08-14 ENCOUNTER — Other Ambulatory Visit: Payer: Self-pay | Admitting: Internal Medicine

## 2024-08-14 ENCOUNTER — Other Ambulatory Visit: Payer: Self-pay

## 2024-08-14 ENCOUNTER — Other Ambulatory Visit (HOSPITAL_COMMUNITY): Payer: Self-pay

## 2024-08-14 MED ORDER — EMPAGLIFLOZIN 25 MG PO TABS
25.0000 mg | ORAL_TABLET | Freq: Every day | ORAL | 3 refills | Status: AC
  Filled 2024-08-14: qty 90, 90d supply, fill #0

## 2024-08-16 ENCOUNTER — Encounter: Payer: Self-pay | Admitting: Internal Medicine

## 2024-08-18 ENCOUNTER — Ambulatory Visit: Payer: Self-pay

## 2024-08-18 NOTE — Telephone Encounter (Signed)
 FYI Only or Action Required?: FYI only for provider: appointment scheduled on 08/21/24.  Patient was last seen in primary care on 05/26/2024 by Glendia Shad, MD.  Called Nurse Triage reporting Extremity Weakness.  Symptoms began several weeks ago.  Interventions attempted: Nothing.  Symptoms are: gradually worsening.  Triage Disposition: No disposition on file.  Patient/caregiver understands and will follow disposition?:    Copied from CRM [id number]. Topic: Clinical - Red Word Triage >> Aug 18, 2024 10:34 AM Debra Kirk wrote: [facility] that prompted transfer to Nurse Triage: Patient has neurothapy and is having pain and weakness in both legs but her left leg is worse than her right. Reason for Disposition  [1] MODERATE pain (e.g., interferes with normal activities, limping) AND [2] present > 3 days  Answer Assessment - Initial Assessment Questions 1. ONSET: When did the pain start?      2 weeks ago, history of neuropathy  2. LOCATION: Where is the pain located?      Both legs  3. PAIN: How bad is the pain?    (Scale 1-10; or mild, moderate, severe)     Worse at night  5. CAUSE: What do you think is causing the leg pain?     [name] reports it could be neuropathy  6. OTHER SYMPTOMS: Do you have any other symptoms? (e.g., chest pain, back pain, breathing difficulty, swelling, rash, fever, numbness, weakness)     Denies  Pt is ambulatory, denies swelling, no incontinence, daughter of patient reports the pt is a&o x 4  Protocols used: Leg Pain-A-AH

## 2024-08-18 NOTE — Telephone Encounter (Signed)
 Will see patient then Agree with ER and UC precautions

## 2024-08-21 ENCOUNTER — Ambulatory Visit
Admission: RE | Admit: 2024-08-21 | Discharge: 2024-08-21 | Disposition: A | Discharge status: Home / Self Care | Source: Ambulatory Visit | Attending: Family Medicine | Admitting: Family Medicine

## 2024-08-21 ENCOUNTER — Ambulatory Visit: Payer: Self-pay | Admitting: Family Medicine

## 2024-08-21 ENCOUNTER — Ambulatory Visit (INDEPENDENT_AMBULATORY_CARE_PROVIDER_SITE_OTHER): Admitting: Family Medicine

## 2024-08-21 ENCOUNTER — Encounter: Payer: Self-pay | Admitting: Family Medicine

## 2024-08-21 VITALS — BP 134/58 | HR 106 | Temp 97.9°F | Ht 63.0 in | Wt 144.1 lb

## 2024-08-21 DIAGNOSIS — M79605 Pain in left leg: Secondary | ICD-10-CM | POA: Diagnosis not present

## 2024-08-21 DIAGNOSIS — M545 Low back pain, unspecified: Secondary | ICD-10-CM | POA: Insufficient documentation

## 2024-08-21 DIAGNOSIS — M5442 Lumbago with sciatica, left side: Secondary | ICD-10-CM

## 2024-08-21 NOTE — Assessment & Plan Note (Signed)
 Pt notes this at night especially, on left side and it does affect gait (having to use her cane) I do wonder if this could be causing radicular pain to lower leg  Xray ordered (reviewed last, some scoliosis and deg change)

## 2024-08-21 NOTE — Progress Notes (Signed)
 "  Subjective:    Patient ID: Debra Kirk, female    DOB: Jul 06, 1930, 89 y.o.   MRN: [id number]  HPI  Wt Readings from Last 3 Encounters:  08/21/24 144 lb 2 oz (65.4 kg)  05/26/24 143 lb (64.9 kg)  01/21/24 144 lb 6.4 oz (65.5 kg)   25.53 kg/m  Vitals:   08/21/24 1213  BP: (!) 134/58  Pulse: (!) 106  Temp: 97.9 F (36.6 C)  SpO2: 96%     89 yo pf of Dr Glendia presents with  Leg pain simmie than right  Gait/balance change -due to pain    2 weeks  Worse at night  Thought it may be her neuropathy  RLE feels numb LLE actually hurts - shin to knee and occational over the knee (nagging pain all the time and occasional severe pain)  Last severe pain was a few weeks ago     Pain affects her gait  Makes her walk funny This makes her back hurt - feels it in left buttock and hip     Pmhx notable for  DM2 Peripheral neuropathy Osteoporosis  Disequilibrium and dizziness are noted on problem list (vertigo in past- saw ENT)  Hyperlipidemia on crestor    DM Lab Results  Component Value Date   HGBA1C 7.8 (H) 05/20/2024   HGBA1C 7.9 (H) 01/15/2024   HGBA1C 8.3 (H) 09/16/2023  Jardiance  Amaryl     Lab Results  Component Value Date   WBC 5.1 01/15/2024   HGB 14.0 01/15/2024   HCT 41.8 01/15/2024   MCV 89.4 01/15/2024   PLT 172.0 01/15/2024   Lab Results  Component Value Date   NA 138 05/20/2024   K 3.8 05/20/2024   CO2 29 05/20/2024   GLUCOSE 202 (H) 05/20/2024   BUN 17 05/20/2024   CREATININE 0.83 05/20/2024   CALCIUM  9.7 05/20/2024   GFR 61.14 05/20/2024   GFRNONAA >60 01/25/2020   Lab Results  Component Value Date   ALT 10 05/20/2024   AST 13 05/20/2024   ALKPHOS 35 (L) 05/20/2024   BILITOT 0.8 05/20/2024   No results found for: ESRSEDRATE, POCTSEDRATE No results found for: ANA, RF Lab Results  Component Value Date   VITAMINB12 365 04/02/2018   Imaging today DG Lumbar Spine 2-3 Views Result Date: 08/21/2024 EXAM: 2 or 3 VIEW(S)  XRAY OF THE LUMBAR SPINE 08/21/2024 01:11:06 PM COMPARISON: X-ray lung 09/05/2022. CLINICAL HISTORY: pain in left low back/buttock area left leg pain (shin/calf) FINDINGS: LUMBAR SPINE: BONES: Right levoscoliosis of the lumbar spine. Stable chronic L5 impression. Slightly worsened L1 chronic compression fracture. DISCS AND DEGENERATIVE CHANGES: Associated severe degenerative changes. SOFT TISSUES: Tacks overlying the right pelvis is suggestive of hernia repair with mesh. No acute abnormality. Severe atherosclerotic plaque. IMPRESSION: 1. Right lumbar levoscoliosis. 2. Slightly worsened L1 chronic compression fracture. Correlate with concern is palpation to evaluate for acute components. 3. Stable chronic L5 impression. 4. Severe atherosclerotic plaque. Electronically signed by: Kate Plummer MD 08/21/2024 02:13 PM EST RP Workstation: [id number]      Patient Active Problem List   Diagnosis Date Noted   Low back pain 08/21/2024   Abnormal radionuclide bone scan 10/14/2022   Urinary frequency 10/04/2022   Right hip pain 09/05/2022   Right buttock pain 09/05/2022   Fall 05/12/2022   Nocturia 01/07/2022   Osteoporosis 04/24/2021   Aortic atherosclerosis 10/11/2020   Abdominal pain 10/05/2020   Pre-op examination 08/10/2020   LOC (loss of consciousness) (HCC) 01/28/2020   Syncope 01/24/2020  Lung nodule 12/14/2017   Bronchiectasis without complication (HCC) 12/14/2017   CAD (coronary artery disease) 03/19/2017   Compression fracture of L1 lumbar vertebra (HCC) 03/19/2017   Femoral neuropathy of left lower extremity 03/19/2017   Fibrocystic breast disease 03/19/2017   Left leg pain 01/26/2017   Atherosclerosis of native arteries of extremity with intermittent claudication 01/26/2017   Leukocytosis 01/18/2017   Hilar adenopathy 01/18/2017   Hyponatremia 01/18/2017   Hypokalemia 01/18/2017   Generalized weakness 01/18/2017   Demand ischemia (HCC) 01/18/2017   Cough 01/15/2017   Dysuria  01/31/2016   Dizziness 01/31/2016   Disequilibrium 01/31/2016   Epistaxis, recurrent 08/08/2015   Palpitations 05/01/2015   SOB (shortness of breath) on exertion 03/23/2015   Chest pain with high risk for cardiac etiology 03/23/2015   Health care maintenance 11/07/2014   Peripheral neuropathy 03/03/2014   Gastroparesis due to DM (HCC) 08/26/2012   Type 2 diabetes mellitus with hyperglycemia (HCC) 08/26/2012   Hypertension 08/26/2012   Hypercholesterolemia 08/26/2012   Past Medical History:  Diagnosis Date   Carotid artery disease    Compression fracture    s/p fosamax, prolia    Diabetes mellitus (HCC)    Femoral neuropathy    left, elevated CRP, ESR, negative FANA and ANCA   Fibrocystic breast disease    Hypercholesterolemia    Hypertension    Past Surgical History:  Procedure Laterality Date   APPENDECTOMY     CHOLECYSTECTOMY     HERNIA REPAIR     INGUINAL HERNIA REPAIR     left x 1, right x 2   OVARIAN CYST REMOVAL     PARTIAL HYSTERECTOMY     Social History[1] Family History  Problem Relation Age of Onset   Heart disease Father        myocardial infarction   Heart disease Mother        myocardial infarction   Diabetes Mother    Congestive Heart Failure Mother    Bone cancer Sister    Diabetes Sister        x2   Ovarian cancer Sister 9   Heart disease Sister        CABG   Rheum arthritis Sister    Breast cancer Neg Hx    Colon cancer Neg Hx    Allergies[2] Medications Ordered Prior to Encounter[3]  Review of Systems  Constitutional:  Negative for chills, fatigue and fever.  Endocrine: Negative for polydipsia and polyphagia.  Musculoskeletal:  Positive for arthralgias, back pain and gait problem. Negative for joint swelling.  Neurological:        Some tingling in feet /baseline   Balance is generally poor (declined PT in past)  Pain in her left leg seems to exacerbate this        Objective:   Physical Exam Constitutional:      General: She is  not in acute distress.    Appearance: Normal appearance. She is well-developed and normal weight. She is not ill-appearing or diaphoretic.     Comments: Appears younger than stated age   HENT:     Head: Normocephalic and atraumatic.     Mouth/Throat:     Mouth: Mucous membranes are moist.  Eyes:     Conjunctiva/sclera: Conjunctivae normal.     Pupils: Pupils are equal, round, and reactive to light.  Neck:     Thyroid : No thyromegaly.     Vascular: No carotid bruit or JVD.  Cardiovascular:     Rate and Rhythm: Normal rate  and regular rhythm.     Heart sounds: Normal heart sounds.     No gallop.     Comments: Some LE varicosities  Good pedal pulses  Pulmonary:     Effort: Pulmonary effort is normal. No respiratory distress.     Breath sounds: Normal breath sounds. No wheezing or rales.  Abdominal:     General: There is no distension or abdominal bruit.     Palpations: Abdomen is soft.  Musculoskeletal:     Cervical back: Normal range of motion and neck supple.     Right lower leg: No edema.     Left lower leg: No edema.     Comments: Mild scoliosis Some loss of lordosis in lumbar spine No midline spinal tenderness Mildly tender over left SI area and greater trochanter   Good rom of both hips and knees  Slight bilat joint line tenderness in left knee    Some pain with SLR on left (also limited flexion due to fatigue)   Feet are well perfused   No leg swelling or palpable cords or skin changes    Lymphadenopathy:     Cervical: No cervical adenopathy.  Skin:    General: Skin is warm and dry.     Coloration: Skin is not jaundiced or pale.     Findings: No bruising or rash.  Neurological:     Mental Status: She is alert.     Sensory: No sensory deficit.     Coordination: Coordination normal.     Deep Tendon Reflexes: Reflexes are normal and symmetric. Reflexes normal.     Comments: Sensation in feet is good considering neuropathy  Gait favors RLE  Using cane   Psychiatric:        Mood and Affect: Mood normal.           Assessment & Plan:   Problem List Items Addressed This Visit       Other   Low back pain   Pt notes this at night especially, on left side and it does affect gait (having to use her cane) I do wonder if this could be causing radicular pain to lower leg  Xray ordered (reviewed last, some scoliosis and deg change)         Relevant Orders   DG Lumbar Spine 2-3 Views (Completed)   Left leg pain - Primary   I do wonder if this could be radicular from back   Interestingly a dull constant ache /worse with walking  At times severe at night (last episode of that was 2 wk ago)   LS xray ordered confirming degenerative changes/severe as well as some compression deformities  Some pain with SLR   Encouraged some heat/ice on back, gentle rom and walking   Since this is primarily one sided-statin myopathy is lower in the differential   Consider course of prednisone  / aware of diabetes however and this would increase glucose          [1]  Social History Tobacco Use   Smoking status: Former    Current packs/day: 0.00    Types: Cigarettes    Quit date: 08/20/1958    Years since quitting: 66.0   Smokeless tobacco: Never  Substance Use Topics   Alcohol use: No    Alcohol/week: 0.0 standard drinks of alcohol   Drug use: No  [2]  Allergies Allergen Reactions   Penicillins Rash    .pcn  [3]  Current Outpatient Medications on File Prior to  Visit  Medication Sig Dispense Refill   AMBULATORY NON FORMULARY MEDICATION Medication Name: Incentive Spirometry Use 10-15 times daily. 1 each 0   amLODipine  (NORVASC ) 5 MG tablet Take 1 tablet (5 mg total) by mouth daily. 90 tablet 3   Calcium  Carbonate-Vitamin D  600-400 MG-UNIT tablet Take 1 tablet by mouth 2 (two) times daily.     denosumab -bbdz (JUBBONTI ) 60 MG/ML SOSY Inject 1 mL into the skin every 6 (six) months. 1 mL 1   empagliflozin  (JARDIANCE ) 25 MG TABS tablet  Take 1 tablet (25 mg total) by mouth daily. 90 tablet 3   glimepiride  (AMARYL ) 2 MG tablet Take 1 tablet (2 mg total) by mouth 2 (two) times daily. 180 tablet 3   glucose blood (CONTOUR TEST) test strip Use as instructed to test blood sugar once daily as directed. 50 strip 6   meclizine  (ANTIVERT ) 12.5 MG tablet Take 1 tablet (12.5 mg total) by mouth 2 (two) times daily as needed for dizziness. 20 tablet 0   metoCLOPramide  (REGLAN ) 5 MG tablet Take 1 tablet (5 mg total) by mouth 3 (three) times daily before meals. 270 tablet 3   omeprazole  (PRILOSEC) 20 MG capsule Take 1 capsule (20 mg total) by mouth daily. 90 capsule 3   rosuvastatin  (CRESTOR ) 20 MG tablet Take 1 tablet (20 mg total) by mouth daily. 90 tablet 3   Respiratory Therapy Supplies (FLUTTER) DEVI Use 10-15 times daily 1 each 0   Current Facility-Administered Medications on File Prior to Visit  Medication Dose Route Frequency Provider Last Rate Last Admin   [START ON 12/15/2024] denosumab -bbdz (JUBBONTI ) injection 60 mg  60 mg Subcutaneous Q6 months Glendia Shad, MD       "

## 2024-08-21 NOTE — Assessment & Plan Note (Addendum)
 I do wonder if this could be radicular from back   Interestingly a dull constant ache /worse with walking  At times severe at night (last episode of that was 2 wk ago)   LS xray ordered confirming degenerative changes/severe as well as some compression deformities  Some pain with SLR   Encouraged some heat/ice on back, gentle rom and walking   Since this is primarily one sided-statin myopathy is lower in the differential   Consider course of prednisone  / aware of diabetes however and this would increase glucose

## 2024-08-21 NOTE — Patient Instructions (Addendum)
 Keep moving - walking with a walker or a cane at home in a safe area   Try some intermittent cold/warm compress   Don't go to sleep on a heating pad however   Tylenol  is ok   Xray of lumbar spine today  I do wonder if you may have a pinched nerve in your back causing the leg pain

## 2024-08-22 ENCOUNTER — Encounter: Payer: Self-pay | Admitting: Internal Medicine

## 2024-08-22 ENCOUNTER — Encounter: Payer: Self-pay | Admitting: Family Medicine

## 2024-08-24 ENCOUNTER — Encounter: Payer: Self-pay | Admitting: Internal Medicine

## 2024-09-08 ENCOUNTER — Telehealth: Payer: Self-pay | Admitting: *Deleted

## 2024-09-08 NOTE — Telephone Encounter (Signed)
 Copied from CRM [id number]. Topic: Appointments - Scheduling Inquiry for Clinic >> Sep 08, 2024  1:58 PM Rea C wrote: Reason for CRM: Pt is calling to schedule her denosumab -bbdz (JUBBONTI ) 60 MG/ML SOSY injection. Pts last one was October 29th.    [phone] (H) preferred

## 2024-09-17 NOTE — Telephone Encounter (Signed)
 Copied from CRM [id number]. Topic: General - Other >> Sep 17, 2024  2:18 PM Zebedee SAUNDERS wrote: Reason for CRM: Pt returning Norine Skelton, CMA call regarding message which was relayed. Pt did not have any questions.

## 2024-09-17 NOTE — Telephone Encounter (Signed)
 Lvm for pt to return call. Okay to relay message from Environmental Education Officer

## 2024-09-18 NOTE — Telephone Encounter (Signed)
 noted

## 2024-09-24 ENCOUNTER — Other Ambulatory Visit: Payer: Self-pay | Admitting: Internal Medicine

## 2024-09-24 ENCOUNTER — Other Ambulatory Visit (HOSPITAL_COMMUNITY): Payer: Self-pay

## 2024-09-25 ENCOUNTER — Other Ambulatory Visit (HOSPITAL_COMMUNITY): Payer: Self-pay

## 2024-09-25 ENCOUNTER — Other Ambulatory Visit: Payer: Self-pay

## 2024-09-25 MED ORDER — GLIMEPIRIDE 2 MG PO TABS
2.0000 mg | ORAL_TABLET | Freq: Two times a day (BID) | ORAL | 3 refills | Status: AC
  Filled 2024-09-25: qty 180, 90d supply, fill #0

## 2024-09-30 ENCOUNTER — Other Ambulatory Visit

## 2024-10-06 ENCOUNTER — Ambulatory Visit: Admitting: Internal Medicine
